# Patient Record
Sex: Female | Born: 1945
Health system: Southern US, Community
[De-identification: ages and names within clinical notes are randomized; demographics above are authoritative.]

## PROBLEM LIST (undated history)

## (undated) DIAGNOSIS — A159 Respiratory tuberculosis unspecified: Secondary | ICD-10-CM

## (undated) DIAGNOSIS — R519 Headache, unspecified: Secondary | ICD-10-CM

## (undated) DIAGNOSIS — C801 Malignant (primary) neoplasm, unspecified: Secondary | ICD-10-CM

## (undated) DIAGNOSIS — N6320 Unspecified lump in the left breast, unspecified quadrant: Secondary | ICD-10-CM

## (undated) DIAGNOSIS — I10 Essential (primary) hypertension: Secondary | ICD-10-CM

## (undated) DIAGNOSIS — R011 Cardiac murmur, unspecified: Secondary | ICD-10-CM

## (undated) DIAGNOSIS — R51 Headache: Secondary | ICD-10-CM

## (undated) DIAGNOSIS — I341 Nonrheumatic mitral (valve) prolapse: Secondary | ICD-10-CM

## (undated) HISTORY — PX: NO PAST SURGERIES: SHX2092

---

## 2015-05-18 ENCOUNTER — Emergency Department (HOSPITAL_COMMUNITY): Payer: BLUE CROSS/BLUE SHIELD

## 2015-05-18 ENCOUNTER — Emergency Department (HOSPITAL_COMMUNITY)
Admission: EM | Admit: 2015-05-18 | Discharge: 2015-05-18 | Disposition: A | Discharge status: Home / Self Care | Payer: BLUE CROSS/BLUE SHIELD | Attending: Emergency Medicine | Admitting: Emergency Medicine

## 2015-05-18 ENCOUNTER — Encounter (HOSPITAL_COMMUNITY): Payer: Self-pay | Admitting: Family Medicine

## 2015-05-18 DIAGNOSIS — Y9389 Activity, other specified: Secondary | ICD-10-CM | POA: Diagnosis not present

## 2015-05-18 DIAGNOSIS — Y998 Other external cause status: Secondary | ICD-10-CM | POA: Insufficient documentation

## 2015-05-18 DIAGNOSIS — S6991XA Unspecified injury of right wrist, hand and finger(s), initial encounter: Secondary | ICD-10-CM | POA: Insufficient documentation

## 2015-05-18 DIAGNOSIS — S0083XA Contusion of other part of head, initial encounter: Secondary | ICD-10-CM | POA: Diagnosis not present

## 2015-05-18 DIAGNOSIS — I10 Essential (primary) hypertension: Secondary | ICD-10-CM | POA: Diagnosis not present

## 2015-05-18 DIAGNOSIS — Y92410 Unspecified street and highway as the place of occurrence of the external cause: Secondary | ICD-10-CM | POA: Diagnosis not present

## 2015-05-18 DIAGNOSIS — S0033XA Contusion of nose, initial encounter: Secondary | ICD-10-CM | POA: Insufficient documentation

## 2015-05-18 DIAGNOSIS — S0993XA Unspecified injury of face, initial encounter: Secondary | ICD-10-CM | POA: Diagnosis present

## 2015-05-18 HISTORY — DX: Essential (primary) hypertension: I10

## 2015-05-18 HISTORY — DX: Nonrheumatic mitral (valve) prolapse: I34.1

## 2015-05-18 MED ORDER — ONDANSETRON 4 MG PO TBDP
4.0000 mg | ORAL_TABLET | Freq: Once | ORAL | Status: DC
  Filled 2015-05-18: qty 1

## 2015-05-18 MED ORDER — HYDROCODONE-ACETAMINOPHEN 5-325 MG PO TABS
1.0000 | ORAL_TABLET | Freq: Once | ORAL | Status: DC
  Filled 2015-05-18: qty 1

## 2015-05-18 NOTE — Discharge Instructions (Signed)
Ice over the bridge of your nose several times during the course of the evening tonight. Expected swelling and bruising around her nose and eyes tonight and tomorrow.  Contusion A contusion is a deep bruise. Contusions are the result of a blunt injury to tissues and muscle fibers under the skin. The injury causes bleeding under the skin. The skin overlying the contusion may turn blue, purple, or yellow. Minor injuries will give you a painless contusion, but more severe contusions may stay painful and swollen for a few weeks.  CAUSES  This condition is usually caused by a blow, trauma, or direct force to an area of the body. SYMPTOMS  Symptoms of this condition include:  Swelling of the injured area.  Pain and tenderness in the injured area.  Discoloration. The area may have redness and then turn blue, purple, or yellow. DIAGNOSIS  This condition is diagnosed based on a physical exam and medical history. An X-ray, CT scan, or MRI may be needed to determine if there are any associated injuries, such as broken bones (fractures). TREATMENT  Specific treatment for this condition depends on what area of the body was injured. In general, the best treatment for a contusion is resting, icing, applying pressure to (compression), and elevating the injured area. This is often called the RICE strategy. Over-the-counter anti-inflammatory medicines may also be recommended for pain control.  HOME CARE INSTRUCTIONS   Rest the injured area.  If directed, apply ice to the injured area:  Put ice in a plastic bag.  Place a towel between your skin and the bag.  Leave the ice on for 20 minutes, 2-3 times per day.  If directed, apply light compression to the injured area using an elastic bandage. Make sure the bandage is not wrapped too tightly. Remove and reapply the bandage as directed by your health care provider.  If possible, raise (elevate) the injured area above the level of your heart while you are  sitting or lying down.  Take over-the-counter and prescription medicines only as told by your health care provider. SEEK MEDICAL CARE IF:  Your symptoms do not improve after several days of treatment.  Your symptoms get worse.  You have difficulty moving the injured area. SEEK IMMEDIATE MEDICAL CARE IF:   You have severe pain.  You have numbness in a hand or foot.  Your hand or foot turns pale or cold.   This information is not intended to replace advice given to you by your health care provider. Make sure you discuss any questions you have with your health care provider.   Document Released: 02/05/2005 Document Revised: 01/17/2015 Document Reviewed: 09/13/2014 Elsevier Interactive Patient Education 2016 [place].  Facial or Scalp Contusion  A facial or scalp contusion is a deep bruise on the face or head. Contusions happen when an injury causes bleeding under the skin. Signs of bruising include pain, puffiness (swelling), and discolored skin. The contusion may turn blue, purple, or yellow. HOME CARE  Only take medicines as told by your doctor.  Put ice on the injured area.  Put ice in a plastic bag.  Place a towel between your skin and the bag.  Leave the ice on for 20 minutes, 2-3 times a day. GET HELP IF:  You have bite problems.  You have pain when chewing.  You are worried about your face not healing normally. GET HELP RIGHT AWAY IF:   You have severe pain or a headache and medicine does not help.  You are very tired or confused, or your personality changes.  You throw up (vomit).  You have a nosebleed that will not stop.  You see two of everything (double vision) or have blurry vision.  You have fluid coming from your nose or ear.  You have problems walking or using your arms or legs. MAKE SURE YOU:   Understand these instructions.  Will watch your condition.  Will get help right away if you are not doing well or get worse.   This  information is not intended to replace advice given to you by your health care provider. Make sure you discuss any questions you have with your health care provider.   Document Released: 04/17/2011 Document Revised: 05/19/2014 Document Reviewed: 12/09/2012 Elsevier Interactive Patient Education [employer].

## 2015-05-18 NOTE — ED Notes (Signed)
Pt ambulatory to restroom

## 2015-05-18 NOTE — ED Notes (Signed)
MD at bedside. 

## 2015-05-18 NOTE — ED Notes (Signed)
Pt only complaining of pain to right wrist and nose. Pt alert and oriented, ambulatory.
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
## 2015-06-02 ENCOUNTER — Emergency Department (HOSPITAL_COMMUNITY)
Admission: EM | Admit: 2015-06-02 | Discharge: 2015-06-02 | Disposition: A | Discharge status: Home / Self Care | Payer: No Typology Code available for payment source | Attending: Emergency Medicine | Admitting: Emergency Medicine

## 2015-06-02 ENCOUNTER — Emergency Department (HOSPITAL_COMMUNITY): Payer: No Typology Code available for payment source

## 2015-06-02 ENCOUNTER — Encounter (HOSPITAL_COMMUNITY): Payer: Self-pay | Admitting: *Deleted

## 2015-06-02 DIAGNOSIS — R42 Dizziness and giddiness: Secondary | ICD-10-CM | POA: Insufficient documentation

## 2015-06-02 DIAGNOSIS — F0781 Postconcussional syndrome: Secondary | ICD-10-CM | POA: Diagnosis not present

## 2015-06-02 DIAGNOSIS — R51 Headache: Secondary | ICD-10-CM | POA: Diagnosis present

## 2015-06-02 DIAGNOSIS — I10 Essential (primary) hypertension: Secondary | ICD-10-CM | POA: Diagnosis not present

## 2015-06-02 LAB — URINE MICROSCOPIC-ADD ON: RBC / HPF: NONE SEEN RBC/hpf (ref 0–5)

## 2015-06-02 LAB — BASIC METABOLIC PANEL
Anion gap: 13 (ref 5–15)
BUN: 8 mg/dL (ref 6–20)
CALCIUM: 9.7 mg/dL (ref 8.9–10.3)
CO2: 26 mmol/L (ref 22–32)
CREATININE: 0.77 mg/dL (ref 0.44–1.00)
Chloride: 101 mmol/L (ref 101–111)
GFR calc Af Amer: >60 mL/min (ref >60)
GLUCOSE: 115 mg/dL — AB (ref 65–99)
Potassium: 4.1 mmol/L (ref 3.5–5.1)
SODIUM: 140 mmol/L (ref 135–145)

## 2015-06-02 LAB — URINALYSIS, ROUTINE W REFLEX MICROSCOPIC
BILIRUBIN URINE: NEGATIVE
GLUCOSE, UA: NEGATIVE mg/dL
HGB URINE DIPSTICK: NEGATIVE
KETONES UR: NEGATIVE mg/dL
Nitrite: NEGATIVE
PROTEIN: NEGATIVE mg/dL
Specific Gravity, Urine: 1.006 (ref 1.005–1.030)
pH: 7 (ref 5.0–8.0)

## 2015-06-02 LAB — CBC WITH DIFFERENTIAL/PLATELET
BASOS ABS: 0.1 10*3/uL (ref 0.0–0.1)
BASOS PCT: 1 %
Eosinophils Absolute: 0.1 10*3/uL (ref 0.0–0.7)
Eosinophils Relative: 2 %
HEMATOCRIT: 38.2 % (ref 36.0–46.0)
Hemoglobin: 12.7 g/dL (ref 12.0–15.0)
Lymphocytes Relative: 30 %
Lymphs Abs: 2.2 10*3/uL (ref 0.7–4.0)
MCH: 29.3 pg (ref 26.0–34.0)
MCHC: 33.2 g/dL (ref 30.0–36.0)
MCV: 88.2 fL (ref 78.0–100.0)
MONO ABS: 0.4 10*3/uL (ref 0.1–1.0)
Monocytes Relative: 6 %
NEUTROS ABS: 4.5 10*3/uL (ref 1.7–7.7)
NEUTROS PCT: 61 %
PLATELETS: 246 10*3/uL (ref 150–400)
RBC: 4.33 MIL/uL (ref 3.87–5.11)
RDW: 13.2 % (ref 11.5–15.5)
WBC: 7.3 10*3/uL (ref 4.0–10.5)

## 2015-06-02 MED ORDER — HYDROCODONE-ACETAMINOPHEN 5-325 MG PO TABS: 1.0000 | ORAL_TABLET | Freq: Four times a day (QID) | ORAL | Status: DC | PRN

## 2015-06-02 MED ORDER — HYDROCODONE-ACETAMINOPHEN 5-325 MG PO TABS
2.0000 | ORAL_TABLET | Freq: Once | ORAL | Status: AC
  Administered 2015-06-02: 2 via ORAL
  Filled 2015-06-02: qty 2

## 2015-06-02 NOTE — ED Notes (Signed)
Family notified of CT results and delay explained.

## 2015-06-02 NOTE — ED Notes (Signed)
Patient's family member came to nurses station stating that patient currently feels much worse. This RN entered to assess patient. Patient found squatting next to stretcher with head tucked to knees. Patient alert and oriented. Family member explained that patient became diaphoretic then expressed that she felt nauseated, weak, and dizzy. Currently if patient attempts to move from position she endorses exacerbation of symptoms. MD Steinl informed of change.
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
## 2015-06-02 NOTE — ED Notes (Addendum)
Pt ambulated in the hall well, slight dizziness once back in bed but manageable.  No dramatic change in vitals.  Provider notified

## 2015-06-02 NOTE — Discharge Instructions (Signed)
???????-???????? ?????? ? ???????? (Head Injury, Adult) ?? ???????? ???????-???????? ??????. ??????? ??? ?? ?????????????? ?????????. ????? ???????-???????? ?????? ????? ?????????? ???????? ???? ? ?????. ??? ????? ?????????. ?????? ????????? ???????? ?? ????????? ????? ??? ?????????? ? ????????? ?????????? ??????. ?????? ????????? ??????????????. ??????????? ?????????? [????? ?????, ??? ? ???] ????????? ? ??????? ?????? 24 ?????, ?? ???????? ??????? ????? ????????? ?? ?????? ?? 7-10 ???? ????? ??????. ????? ????????? ??????? ?? ????? ?????????? ? ????????? ? ?????? ??? ?????????? ?????????? ?? ??????????? ???????, ???? ? ????????? ???????? ?????????. ????? ????????? ???? ???????-???????? ??????? ???????-???????? ?????? ????? ???? ??????????????, ????????, ????? ?? ?????. ????????? ?????? ?????? ????? ???? ????? ??????????. ????? ??????? ?????? ?????? ????????:  ?????????? ????? (????????).  ???? ????????? ????? (????????). ??? ????????, ??? ????? ????? ????????????? ? ????, ??? ????? ???????? ? ?????.  ??????? ?????? (??????? ?????? ??????).  ????????????? ? ????, ????? ?????????? ?????, ?????????? ??????? ? ??????????? ????? (????????). ?????? ??????? ???????-???????? ??????? ????????? ?????? ?????? ???? ??????????? ? ?????, ???????? ? ????????????? ?????? ? ??????? ?? ???? ??????????? ?????? ????????????. ????? ?????? ?????? ????????? ????? ?????? - ???????????? ??? ????????????? ??????, ?????????? ?????? ? ???????. ??? ????? ?????????? ???????-???????? ??????? ??? ??????????? ?????? ?????? ????? ??????? ????? ?????? ??????? ???????, ?????????? ? ??????, ? ??????? ????????. ??????? ??????? ?????? ????????? ?????????? ???????????? ?????? ????????? ?????, ??????????, ????????, ??? ??-???????????? ??? ???-????????????. ?????, ??? ?????????? ????????? ???????? ?? ???? ? ????????.  ? ????? ??????? ??????? ?????? ?????????? ? ??????  ?????????? ?????? ?????????, ????:  ?????????? ??????????? ????????  ??? ??????????.  ?? ?????????? ?????????? ? ??????(???????) ??? ?????????? ???????????????? ??????? ?????.  ? ??? ??????????? ?????????????? ??? ?????????????? [??? ??????/???????].  ? ??? ?????????? ???????, ???????????????? ???????? ????, ?? ????????? ???????????. ??? ?????????? ????, ????????? ? ???????? ??????????? ?????????? ?????? ??????????? ?????? ?????????????? ??? ??????????? ?????????.  ? ??? ?????????? ????????? ?????????? ??????? ??? ??? ???, ??? ?? ?????? ??????.  ?? ???????? ??????? ? ??????? ?????? ????? ? ?????? ??????? ????? ???? (???????).  ?? ???????? ????????? ?????????? ??? ???????? ???????? ?? ???? ??? ????.  ? ??? ??????????? ????????? ??????. ? ??????? ????????? 24 ????? ????? ?????? ?? ?????? ?????????? ? ???, ??? ?????? ????????? ?? ?????????? ????????? ?????????. ???? ??????? ?????? ????????? ? ??????? ?????????? ?????? (?? ???????? 911 ? ???), ???? ? ??? ???????? ????????, ?????? ???????? ??? ??? ????? ?????????? ?????????. ??? ? ???? ????????????? ????????????? ???????-???????? ?????? ? ???????? ???????? ?????? ????? ?????????????? ????????? ????? ?????? ???????? ????????? ???????-???????????? ????????????. ????? ?????? ? ???????? ????????????? ??????????? ????? ?????? ??????, ????? ????? ?? ????? ???? ? ??????????? ?????????????? ??????? ????????????. ????? ??????? ??????? ?????? ??? ???? ?? ?????????? ? ?? ????? ?????????? ?????????? ??? (???, ????????, ??????). ????? ????, ??????? ???????? ??????? ????? ???????????? ?? ????; ??? ????? ?????????????? ???????? ????? ????????????? ?????? ??????.  ????? ? C???? ????????? ? ??????? ???????????? ? ???????? ???????? ?????? ??? ?? ????????? ? ???? ????????????, ???? ????????? ?????? ???? ???????? ??????? ??????. ???? ? ??? ?????????? ????? ?? ????????????????? ?????????, ?? ?? ?????? ???????????? ? ??????? ???????????? ??? ??????????? ? ?????????? ????? ?????? ?? ????? 1 ?????? ????? ????, ??? ??? ????????  ????????:  ?????????? ???????? ????.  ?????????????? ??? ???????.  ?????? ???????? ? ????????????.  ??????????? ????????.  ????????? ??????.  ??????? ??? ?????.  ?????????? ????????????.  ?????????????????.  ??????????????? ????? ????? ??? ??????? ??????.  ??????? ??? ?????????.  ????????? ???. ?????????, ??? ??:   ????????? ?????? ??????????.  ?????? ?????????????? ??????? ?? ????? ??????????.  ??????????????? ?????????? ? ?????, ???? ??? ?? ?????????? ????? ??? ?????????? ????.   ??? ?????????? ?? ????? ???????? ??????, ??????????????? ????? ??????. ??????????? ???????? ????? ???????????? ??? ??????? ? ????? ??????? ??????.  Document Released: 04/28/2005 Document Revised: 05/19/2014 Elsevier Interactive Patient Education [employer].

## 2015-06-04 ENCOUNTER — Telehealth: Payer: Self-pay | Admitting: Internal Medicine
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
## 2015-06-13 NOTE — Telephone Encounter (Signed)
[place] if not a "[employer]

## 2015-06-13 NOTE — Telephone Encounter (Signed)
Left vm to call back to schedule appt.  °

## 2015-06-19 ENCOUNTER — Ambulatory Visit (INDEPENDENT_AMBULATORY_CARE_PROVIDER_SITE_OTHER): Payer: BLUE CROSS/BLUE SHIELD | Admitting: Neurology

## 2015-06-19 ENCOUNTER — Encounter: Payer: Self-pay | Admitting: Neurology

## 2015-06-19 VITALS — BP 132/74 | HR 82 | Resp 16 | Wt 167.0 lb

## 2015-06-19 DIAGNOSIS — F0781 Postconcussional syndrome: Secondary | ICD-10-CM

## 2015-06-19 MED ORDER — AMITRIPTYLINE HCL 10 MG PO TABS: ORAL_TABLET | ORAL | Status: DC

## 2015-06-19 MED ORDER — MECLIZINE HCL 25 MG PO TABS: 25.0000 mg | ORAL_TABLET | Freq: Three times a day (TID) | ORAL | Status: DC | PRN
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
## 2015-06-25 ENCOUNTER — Other Ambulatory Visit (INDEPENDENT_AMBULATORY_CARE_PROVIDER_SITE_OTHER): Payer: BLUE CROSS/BLUE SHIELD

## 2015-06-25 ENCOUNTER — Ambulatory Visit (INDEPENDENT_AMBULATORY_CARE_PROVIDER_SITE_OTHER): Payer: BLUE CROSS/BLUE SHIELD | Admitting: Internal Medicine

## 2015-06-25 ENCOUNTER — Encounter: Payer: Self-pay | Admitting: Internal Medicine

## 2015-06-25 VITALS — BP 170/78 | HR 75 | Ht 61.0 in | Wt 168.0 lb

## 2015-06-25 DIAGNOSIS — E559 Vitamin D deficiency, unspecified: Secondary | ICD-10-CM

## 2015-06-25 DIAGNOSIS — E785 Hyperlipidemia, unspecified: Secondary | ICD-10-CM

## 2015-06-25 DIAGNOSIS — R413 Other amnesia: Secondary | ICD-10-CM | POA: Insufficient documentation

## 2015-06-25 DIAGNOSIS — F0781 Postconcussional syndrome: Secondary | ICD-10-CM

## 2015-06-25 DIAGNOSIS — I10 Essential (primary) hypertension: Secondary | ICD-10-CM

## 2015-06-25 DIAGNOSIS — R270 Ataxia, unspecified: Secondary | ICD-10-CM

## 2015-06-25 DIAGNOSIS — R453 Demoralization and apathy: Secondary | ICD-10-CM | POA: Insufficient documentation

## 2015-06-25 DIAGNOSIS — R202 Paresthesia of skin: Secondary | ICD-10-CM

## 2015-06-25 DIAGNOSIS — R5383 Other fatigue: Secondary | ICD-10-CM | POA: Insufficient documentation

## 2015-06-25 DIAGNOSIS — R5382 Chronic fatigue, unspecified: Secondary | ICD-10-CM

## 2015-06-25 LAB — CBC WITH DIFFERENTIAL/PLATELET
BASOS PCT: 1.4 % (ref 0.0–3.0)
Basophils Absolute: 0.1 10*3/uL (ref 0.0–0.1)
EOS PCT: 2.2 % (ref 0.0–5.0)
Eosinophils Absolute: 0.1 10*3/uL (ref 0.0–0.7)
HCT: 40.5 % (ref 36.0–46.0)
Hemoglobin: 13.5 g/dL (ref 12.0–15.0)
LYMPHS ABS: 1.7 10*3/uL (ref 0.7–4.0)
Lymphocytes Relative: 25.3 % (ref 12.0–46.0)
MCHC: 33.3 g/dL (ref 30.0–36.0)
MCV: 87.5 fl (ref 78.0–100.0)
MONO ABS: 0.4 10*3/uL (ref 0.1–1.0)
Monocytes Relative: 6.6 % (ref 3.0–12.0)
NEUTROS PCT: 64.5 % (ref 43.0–77.0)
Neutro Abs: 4.3 10*3/uL (ref 1.4–7.7)
Platelets: 287 10*3/uL (ref 150.0–400.0)
RBC: 4.62 Mil/uL (ref 3.87–5.11)
RDW: 14.6 % (ref 11.5–15.5)
WBC: 6.7 10*3/uL (ref 4.0–10.5)

## 2015-06-25 LAB — BASIC METABOLIC PANEL
BUN: 13 mg/dL (ref 6–23)
CO2: 29 mEq/L (ref 19–32)
Calcium: 9.6 mg/dL (ref 8.4–10.5)
Chloride: 106 mEq/L (ref 96–112)
Creatinine, Ser: 0.85 mg/dL (ref 0.40–1.20)
GFR: 70.12 mL/min (ref >60.00)
GLUCOSE: 115 mg/dL — AB (ref 70–99)
POTASSIUM: 4.3 meq/L (ref 3.5–5.1)
Sodium: 142 mEq/L (ref 135–145)

## 2015-06-25 LAB — URINALYSIS, ROUTINE W REFLEX MICROSCOPIC
BILIRUBIN URINE: NEGATIVE
Hgb urine dipstick: NEGATIVE
Ketones, ur: NEGATIVE
NITRITE: NEGATIVE
PH: 7.5 (ref 5.0–8.0)
RBC / HPF: NONE SEEN (ref 0–?)
SPECIFIC GRAVITY, URINE: 1.01 (ref 1.000–1.030)
Total Protein, Urine: NEGATIVE
Urine Glucose: NEGATIVE
Urobilinogen, UA: 0.2 (ref 0.0–1.0)

## 2015-06-25 LAB — VITAMIN D 25 HYDROXY (VIT D DEFICIENCY, FRACTURES): VITD: 23.56 ng/mL — AB (ref 30.00–100.00)

## 2015-06-25 LAB — HEPATIC FUNCTION PANEL
ALT: 26 U/L (ref 0–35)
AST: 20 U/L (ref 0–37)
Albumin: 4.5 g/dL (ref 3.5–5.2)
Alkaline Phosphatase: 78 U/L (ref 39–117)
BILIRUBIN TOTAL: 0.4 mg/dL (ref 0.2–1.2)
Bilirubin, Direct: 0.1 mg/dL (ref 0.0–0.3)
Total Protein: 7.6 g/dL (ref 6.0–8.3)

## 2015-06-25 LAB — LIPID PANEL
CHOL/HDL RATIO: 5
Cholesterol: 281 mg/dL — ABNORMAL HIGH (ref 0–200)
HDL: 59.5 mg/dL (ref >39.00)
LDL Cholesterol: 185 mg/dL — ABNORMAL HIGH (ref 0–99)
NONHDL: 221.35
Triglycerides: 180 mg/dL — ABNORMAL HIGH (ref 0.0–149.0)
VLDL: 36 mg/dL (ref 0.0–40.0)

## 2015-06-25 LAB — VITAMIN B12: VITAMIN B 12: 542 pg/mL (ref 211–911)

## 2015-06-25 LAB — TSH: TSH: 1.77 u[IU]/mL (ref 0.35–4.50)

## 2015-06-25 MED ORDER — LOSARTAN POTASSIUM 100 MG PO TABS: 100.0000 mg | ORAL_TABLET | Freq: Every day | ORAL | Status: DC

## 2015-06-25 MED ORDER — PHOSPHATIDYLSERINE-DHA-EPA 100-19.5-6.5 MG PO CAPS: 1.0000 | ORAL_CAPSULE | Freq: Every day | ORAL | Status: DC

## 2015-06-25 MED ORDER — ERGOCALCIFEROL 1.25 MG (50000 UT) PO CAPS: 50000.0000 [IU] | ORAL_CAPSULE | ORAL | Status: DC

## 2015-06-25 MED ORDER — VITAMIN D 1000 UNITS PO TABS: 1000.0000 [IU] | ORAL_TABLET | Freq: Every day | ORAL | Status: DC
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
## 2015-06-25 NOTE — Assessment & Plan Note (Signed)
Added Losartan NAS diet
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
## 2015-06-25 NOTE — Assessment & Plan Note (Addendum)
  2/17 Start [employer]
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
## 2015-06-25 NOTE — Progress Notes (Signed)
Pre visit review using our clinic review tool, if applicable. No additional management support is needed unless otherwise documented below in the visit note. 
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
## 2015-06-25 NOTE — Assessment & Plan Note (Signed)
start Vit D prescription 50000 iu weekly (Rx emailed to your pharmacy) followed by over-the-counter Vit D 1000 iu daily.  
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
## 2015-07-04 ENCOUNTER — Ambulatory Visit: Payer: BLUE CROSS/BLUE SHIELD | Admitting: Neurology

## 2015-07-10 ENCOUNTER — Ambulatory Visit (INDEPENDENT_AMBULATORY_CARE_PROVIDER_SITE_OTHER): Payer: BLUE CROSS/BLUE SHIELD | Admitting: Internal Medicine

## 2015-07-10 ENCOUNTER — Encounter: Payer: Self-pay | Admitting: Internal Medicine

## 2015-07-10 VITALS — BP 160/80 | HR 70 | Wt 170.0 lb

## 2015-07-10 DIAGNOSIS — E559 Vitamin D deficiency, unspecified: Secondary | ICD-10-CM

## 2015-07-10 DIAGNOSIS — R413 Other amnesia: Secondary | ICD-10-CM | POA: Diagnosis not present

## 2015-07-10 DIAGNOSIS — F0781 Postconcussional syndrome: Secondary | ICD-10-CM

## 2015-07-10 DIAGNOSIS — R270 Ataxia, unspecified: Secondary | ICD-10-CM | POA: Diagnosis not present

## 2015-07-10 DIAGNOSIS — I10 Essential (primary) hypertension: Secondary | ICD-10-CM

## 2015-07-10 DIAGNOSIS — R453 Demoralization and apathy: Secondary | ICD-10-CM

## 2015-07-10 MED ORDER — HYDROCHLOROTHIAZIDE 12.5 MG PO CAPS: 12.5000 mg | ORAL_CAPSULE | Freq: Every day | ORAL | Status: DC

## 2015-07-10 MED ORDER — BUTALBITAL-APAP-CAFFEINE 50-325-40 MG PO TABS: 1.0000 | ORAL_TABLET | Freq: Two times a day (BID) | ORAL | Status: DC | PRN
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
## 2015-07-10 NOTE — Assessment & Plan Note (Signed)
Vit D prescription 50000 iu weekly (Rx emailed to your pharmacy) followed by over-the-counter Vit D 1000 iu daily.

## 2015-07-10 NOTE — Assessment & Plan Note (Addendum)
PT/OT offered Exercising at home to improve balance

## 2015-07-10 NOTE — Progress Notes (Signed)
Pre visit review using our clinic review tool, if applicable. No additional management support is needed unless otherwise documented below in the visit note. 
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
## 2015-07-10 NOTE — Assessment & Plan Note (Addendum)
On Vayacog Rest

## 2015-07-10 NOTE — Assessment & Plan Note (Signed)
Unable to work On [employer] prn  Potential benefits of a long term Fioricet  use as well as potential risks  and complications were explained to the patient and were aknowledged.  Rest

## 2015-08-22 ENCOUNTER — Encounter: Payer: Self-pay | Admitting: Internal Medicine

## 2015-08-22 ENCOUNTER — Ambulatory Visit (INDEPENDENT_AMBULATORY_CARE_PROVIDER_SITE_OTHER): Payer: BLUE CROSS/BLUE SHIELD | Admitting: Internal Medicine

## 2015-08-22 VITALS — BP 150/78 | HR 67 | Wt 170.0 lb

## 2015-08-22 DIAGNOSIS — R270 Ataxia, unspecified: Secondary | ICD-10-CM

## 2015-08-22 DIAGNOSIS — R413 Other amnesia: Secondary | ICD-10-CM

## 2015-08-22 DIAGNOSIS — E785 Hyperlipidemia, unspecified: Secondary | ICD-10-CM | POA: Diagnosis not present

## 2015-08-22 DIAGNOSIS — I10 Essential (primary) hypertension: Secondary | ICD-10-CM | POA: Diagnosis not present

## 2015-08-22 DIAGNOSIS — R453 Demoralization and apathy: Secondary | ICD-10-CM | POA: Diagnosis not present

## 2015-08-22 MED ORDER — ATORVASTATIN CALCIUM 10 MG PO TABS: 10.0000 mg | ORAL_TABLET | Freq: Every day | ORAL | Status: DC

## 2015-08-22 MED ORDER — TRIAMTERENE-HCTZ 37.5-25 MG PO TABS: 1.0000 | ORAL_TABLET | Freq: Every day | ORAL | Status: DC

## 2015-08-22 NOTE — Assessment & Plan Note (Addendum)
Alenolol,  Change Losartan-HCT to Losartan and Maxzide

## 2015-08-22 NOTE — Assessment & Plan Note (Signed)
A little better 

## 2015-08-22 NOTE — Patient Instructions (Addendum)
Stop HCTZ Start Maxzide 1/2 tab daily Atorvastatin 10 mg 1/2 tab 3 times a week
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
## 2015-08-22 NOTE — Assessment & Plan Note (Signed)
A little better Cont [facility]

## 2015-08-22 NOTE — Progress Notes (Signed)
Pre visit review using our clinic review tool, if applicable. No additional management support is needed unless otherwise documented below in the visit note. 

## 2015-08-26 NOTE — Assessment & Plan Note (Signed)
A little better 

## 2015-08-26 NOTE — Assessment & Plan Note (Signed)
Slow improvement

## 2015-08-29 ENCOUNTER — Ambulatory Visit: Payer: BLUE CROSS/BLUE SHIELD | Admitting: Neurology

## 2015-09-05 ENCOUNTER — Ambulatory Visit: Payer: BLUE CROSS/BLUE SHIELD | Admitting: Neurology

## 2015-10-22 ENCOUNTER — Other Ambulatory Visit (INDEPENDENT_AMBULATORY_CARE_PROVIDER_SITE_OTHER): Payer: BLUE CROSS/BLUE SHIELD

## 2015-10-22 DIAGNOSIS — I10 Essential (primary) hypertension: Secondary | ICD-10-CM | POA: Diagnosis not present

## 2015-10-22 DIAGNOSIS — R413 Other amnesia: Secondary | ICD-10-CM

## 2015-10-22 DIAGNOSIS — E785 Hyperlipidemia, unspecified: Secondary | ICD-10-CM

## 2015-10-22 LAB — LIPID PANEL
CHOL/HDL RATIO: 4
Cholesterol: 262 mg/dL — ABNORMAL HIGH (ref 0–200)
HDL: 61.2 mg/dL (ref >39.00)
LDL CALC: 178 mg/dL — AB (ref 0–99)
NONHDL: 200.48
Triglycerides: 110 mg/dL (ref 0.0–149.0)
VLDL: 22 mg/dL (ref 0.0–40.0)

## 2015-10-22 LAB — HEPATIC FUNCTION PANEL
ALBUMIN: 4.2 g/dL (ref 3.5–5.2)
ALT: 23 U/L (ref 0–35)
AST: 20 U/L (ref 0–37)
Alkaline Phosphatase: 67 U/L (ref 39–117)
BILIRUBIN TOTAL: 0.5 mg/dL (ref 0.2–1.2)
Bilirubin, Direct: 0.1 mg/dL (ref 0.0–0.3)
Total Protein: 7 g/dL (ref 6.0–8.3)

## 2015-10-22 LAB — BASIC METABOLIC PANEL
BUN: 15 mg/dL (ref 6–23)
CO2: 27 meq/L (ref 19–32)
Calcium: 9.2 mg/dL (ref 8.4–10.5)
Chloride: 106 mEq/L (ref 96–112)
Creatinine, Ser: 0.83 mg/dL (ref 0.40–1.20)
GFR: 72 mL/min (ref >60.00)
GLUCOSE: 112 mg/dL — AB (ref 70–99)
Potassium: 4.4 mEq/L (ref 3.5–5.1)
SODIUM: 141 meq/L (ref 135–145)

## 2015-10-22 LAB — SEDIMENTATION RATE: Sed Rate: 16 mm/hr (ref 0–30)

## 2015-10-24 ENCOUNTER — Encounter: Payer: Self-pay | Admitting: Internal Medicine

## 2015-10-24 ENCOUNTER — Ambulatory Visit (INDEPENDENT_AMBULATORY_CARE_PROVIDER_SITE_OTHER): Payer: BLUE CROSS/BLUE SHIELD | Admitting: Internal Medicine

## 2015-10-24 VITALS — BP 150/82 | HR 104 | Wt 169.0 lb

## 2015-10-24 DIAGNOSIS — F0781 Postconcussional syndrome: Secondary | ICD-10-CM | POA: Diagnosis not present

## 2015-10-24 DIAGNOSIS — R413 Other amnesia: Secondary | ICD-10-CM

## 2015-10-24 DIAGNOSIS — E785 Hyperlipidemia, unspecified: Secondary | ICD-10-CM

## 2015-10-24 DIAGNOSIS — I10 Essential (primary) hypertension: Secondary | ICD-10-CM | POA: Diagnosis not present

## 2015-10-24 MED ORDER — IRBESARTAN-HYDROCHLOROTHIAZIDE 150-12.5 MG PO TABS: 1.0000 | ORAL_TABLET | Freq: Every day | ORAL | Status: DC

## 2015-10-24 MED ORDER — ATORVASTATIN CALCIUM 20 MG PO TABS: 20.0000 mg | ORAL_TABLET | Freq: Every day | ORAL | Status: DC

## 2015-10-24 NOTE — Assessment & Plan Note (Addendum)
Increase the dose to 20 mg Labs in 3 mo
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
## 2015-10-24 NOTE — Assessment & Plan Note (Addendum)
On Alenolol,  pt stopped Losartan, Maxzide Start Avalide

## 2015-10-24 NOTE — Progress Notes (Signed)
Pre visit review using our clinic review tool, if applicable. No additional management support is needed unless otherwise documented below in the visit note. 

## 2015-10-24 NOTE — Assessment & Plan Note (Signed)
On [facility]
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
## 2015-11-20 ENCOUNTER — Encounter (HOSPITAL_COMMUNITY): Payer: Self-pay | Admitting: Emergency Medicine

## 2015-11-20 ENCOUNTER — Emergency Department (HOSPITAL_COMMUNITY)
Admission: EM | Admit: 2015-11-20 | Discharge: 2015-11-20 | Disposition: A | Discharge status: Home / Self Care | Payer: Medicare HMO | Attending: Emergency Medicine | Admitting: Emergency Medicine

## 2015-11-20 ENCOUNTER — Telehealth: Payer: Self-pay | Admitting: Internal Medicine

## 2015-11-20 DIAGNOSIS — T148XXA Other injury of unspecified body region, initial encounter: Secondary | ICD-10-CM

## 2015-11-20 DIAGNOSIS — S61256A Open bite of right little finger without damage to nail, initial encounter: Secondary | ICD-10-CM | POA: Insufficient documentation

## 2015-11-20 DIAGNOSIS — Z79899 Other long term (current) drug therapy: Secondary | ICD-10-CM | POA: Insufficient documentation

## 2015-11-20 DIAGNOSIS — Y999 Unspecified external cause status: Secondary | ICD-10-CM | POA: Insufficient documentation

## 2015-11-20 DIAGNOSIS — W5501XA Bitten by cat, initial encounter: Secondary | ICD-10-CM | POA: Insufficient documentation

## 2015-11-20 DIAGNOSIS — Y939 Activity, unspecified: Secondary | ICD-10-CM | POA: Insufficient documentation

## 2015-11-20 DIAGNOSIS — Y92009 Unspecified place in unspecified non-institutional (private) residence as the place of occurrence of the external cause: Secondary | ICD-10-CM | POA: Diagnosis not present

## 2015-11-20 DIAGNOSIS — Z7982 Long term (current) use of aspirin: Secondary | ICD-10-CM | POA: Diagnosis not present

## 2015-11-20 DIAGNOSIS — I10 Essential (primary) hypertension: Secondary | ICD-10-CM | POA: Diagnosis not present

## 2015-11-20 MED ORDER — TETANUS-DIPHTH-ACELL PERTUSSIS 5-2.5-18.5 LF-MCG/0.5 IM SUSP
0.5000 mL | Freq: Once | INTRAMUSCULAR | Status: AC
  Administered 2015-11-20: 0.5 mL via INTRAMUSCULAR
  Filled 2015-11-20: qty 0.5

## 2015-11-20 MED ORDER — AMOXICILLIN-POT CLAVULANATE 875-125 MG PO TABS: 1.0000 | ORAL_TABLET | Freq: Two times a day (BID) | ORAL | Status: DC

## 2015-11-20 MED ORDER — RABIES IMMUNE GLOBULIN 150 UNIT/ML IM INJ
20.0000 [IU]/kg | INJECTION | Freq: Once | INTRAMUSCULAR | Status: AC
  Administered 2015-11-20: 1500 [IU] via INTRAMUSCULAR
  Filled 2015-11-20: qty 10

## 2015-11-20 MED ORDER — RABIES VACCINE, PCEC IM SUSR
1.0000 mL | Freq: Once | INTRAMUSCULAR | Status: AC
  Administered 2015-11-20: 1 mL via INTRAMUSCULAR
  Filled 2015-11-20: qty 1

## 2015-11-20 NOTE — Telephone Encounter (Signed)
Patient Name: Robin Estrada DOB: [birth year] Initial Comment Mother has a mole bite Nurse Assessment Nurse: Vallery Sa, RN, [place] Date/Time (Eastern Time): 11/20/2015 1:36:33 PM Confirm and document reason for call. If symptomatic, describe symptoms. You must click the next button to save text entered. ---Caller states his mother has a mole bite her right pinky finger about 2-3 hours ago. No active bleeding. Has the patient traveled out of the country within the last 30 days? ---No Does the patient have any new or worsening symptoms? ---Yes Will a triage be completed? ---Yes Related visit to physician within the last 2 weeks? ---No Does the PT have any chronic conditions? (i.e. diabetes, asthma, etc.) ---Yes List chronic conditions. ---High Blood Pressure, Headaches Is this a behavioral health or substance abuse call? ---No Guidelines Guideline Title Affirmed Question Affirmed Notes Animal Bite [1] Any break in skin (e.g., cut, puncture or scratch) AND [2] wild animal at risk for RABIES (e.g., bat, raccoon, fox, skunk, coyote, other carnivores) Final Disposition User Go to ED Now Vallery Sa, RN, [facility] - ED Disagree/Comply: Comply

## 2015-11-20 NOTE — ED Notes (Signed)
Pt brought a mole from her yard into her house and went to pick it up and was bitten on her right pinky finger.

## 2015-11-20 NOTE — ED Provider Notes (Signed)
CSN: [id number]     Arrival date & time 11/20/15  1443 History  By signing my name below, I, Robin Estrada, attest that this documentation has been prepared under the direction and in the presence of Robin Evangelist, PA-C Electronically Signed: Soijett Estrada, ED Scribe. 11/20/2015. 4:19 PM.   Chief Complaint  Patient presents with  . Animal Bite    The history is provided by the patient and a relative. No language interpreter was used.    Robin Estrada is a 70 y.o. female who presents to the Emergency Department complaining of an animal bite onset 11 AM today. Son provides translates history since patient speaks [place]. Pt notes that her cat brought a mole from her yard into her house and the pt went to pick up the mole, and it bit her on her right pinky finger. Pt denies pain to the area. Pt is not UTD on her tetanus vaccination. Pt reports that she has tried wound care by irrigating it and applying iodine. Pt denies right pinky finger pain, swelling, and any other symptoms. She states she is almost positive that the animal was a mole and states it got out of the house alive.   Past Medical History  Diagnosis Date  . Mitral valve prolapse   . Hypertension    Past Surgical History  Procedure Laterality Date  . No past surgeries     Family History  Problem Relation Age of Onset  . Stroke Mother   . Cancer Mother 18    breast ca  . Stroke Maternal Grandmother    Social History  Substance Use Topics  . Smoking status: Never Smoker   . Smokeless tobacco: Never Used  . Alcohol Use: No   OB History    No data available     Review of Systems  Constitutional: Negative for fever and chills.  Musculoskeletal: Negative for joint swelling and arthralgias.  Skin: Positive for wound (animal bite to right pinky).      Allergies  Review of patient's allergies indicates no known allergies.  Home Medications   Prior to Admission medications   Medication Sig Start Date End Date  Taking? Authorizing Provider  aspirin 81 MG tablet Take 81 mg by mouth daily.    Historical Provider, MD  atenolol (TENORMIN) 50 MG tablet Take 50 mg by mouth 2 (two) times daily.     Historical Provider, MD  atorvastatin (LIPITOR) 20 MG tablet Take 1 tablet (20 mg total) by mouth daily. 10/24/15   [name], MD  butalbital-acetaminophen-caffeine (FIORICET) [phone] MG tablet Take 1-2 tablets by mouth 2 (two) times daily as needed for headache. 07/10/15   [name], MD  cholecalciferol (VITAMIN D) 1000 units tablet Take 1 tablet (1,000 Units total) by mouth daily. 06/25/15   Evie Lacks Plotnikov, MD  irbesartan-hydrochlorothiazide (AVALIDE) 150-12.5 MG tablet Take 1 tablet by mouth daily. 10/24/15   Evie Lacks Plotnikov, MD  meclizine (ANTIVERT) 25 MG tablet Take 1 tablet (25 mg total) by mouth 3 (three) times daily as needed for dizziness. 06/19/15   Cameron Sprang, MD  Phosphatidylserine-DHA-EPA (VAYACOG) 100-19.5-6.5 MG CAPS Take 1 capsule by mouth daily. 06/25/15   Evie Lacks Plotnikov, MD   BP 187/77 mmHg  Pulse 88  Temp(Src) 98.3 F (36.8 C) (Oral)  Resp 20  SpO2 96%   Physical Exam  Constitutional: She is oriented to person, place, and time. She appears well-developed and well-nourished. No distress.  HENT:  Head: Normocephalic and  atraumatic.  Eyes: EOM are normal.  Neck: Neck supple.  Cardiovascular: Normal rate.   Pulmonary/Chest: Effort normal. No respiratory distress.  Abdominal: She exhibits no distension.  Musculoskeletal: Normal range of motion.  Small bite mark on palmar aspect of right pinky fingerpad. No active bleeding or drainage.   Neurological: She is alert and oriented to person, place, and time.  Skin: Skin is warm and dry.  Psychiatric: She has a normal mood and affect. Her behavior is normal.  Nursing note and vitals reviewed.   ED Course  Procedures (including critical care time) DIAGNOSTIC STUDIES: Oxygen Saturation is 96% on RA, nl by my  interpretation.    COORDINATION OF CARE: 4:19 PM Discussed treatment plan with pt at bedside which includes update tetanus, augmentin Rx, rabies treatment and pt agreed to plan.   Meds given in ED:  Medications  rabies immune globulin (HYPERAB) injection 1,500 Units (1,500 Units Intramuscular Given 11/20/15 1723)  rabies vaccine (RABAVERT) injection 1 mL (1 mL Intramuscular Given 11/20/15 1721)  Tdap (BOOSTRIX) injection 0.5 mL (0.5 mLs Intramuscular Given 11/20/15 1717)    Discharge Medication List as of 11/20/2015  5:47 PM    START taking these medications   Details  amoxicillin-clavulanate (AUGMENTIN) 875-125 MG tablet Take 1 tablet by mouth every 12 (twelve) hours., Starting 11/20/2015, Until Discontinued, Print         MDM   Final diagnoses:  Animal bite   70 year old female presents with a animal bite which is presumably a mole. Since it is unsure what animal it actually was, will initiate Rabies treatment. Wound has been irrigated and cleansed thoroughly already by patient. Tdap updated. Will also start antibiotic. Advised patient to return for second vaccination series in 3 days (7/14) and to return sooner if signs of infection develop. Patient and son verbalized understanding. Patient is NAD, non-toxic, with stable VS. Patient is informed of clinical course, understands medical decision making process, and agrees with plan. Opportunity for questions provided and all questions answered. Return precautions given.  I personally performed the services described in this documentation, which was scribed in my presence. The recorded information has been reviewed and is accurate.    Robin Evangelist, PA-C 11/21/15 1636  Merrily Pew, MD 11/23/15 2337

## 2015-11-23 ENCOUNTER — Encounter (HOSPITAL_COMMUNITY): Payer: Self-pay | Admitting: Emergency Medicine

## 2015-11-23 ENCOUNTER — Ambulatory Visit (HOSPITAL_COMMUNITY)
Admission: EM | Admit: 2015-11-23 | Discharge: 2015-11-23 | Disposition: A | Discharge status: Home / Self Care | Payer: Medicare HMO | Attending: Internal Medicine | Admitting: Internal Medicine

## 2015-11-23 DIAGNOSIS — Z203 Contact with and (suspected) exposure to rabies: Secondary | ICD-10-CM

## 2015-11-23 MED ORDER — RABIES VACCINE, PCEC IM SUSR
1.0000 mL | Freq: Once | INTRAMUSCULAR | Status: AC
  Administered 2015-11-23: 1 mL via INTRAMUSCULAR

## 2015-11-23 MED ORDER — RABIES VACCINE, PCEC IM SUSR
INTRAMUSCULAR | Status: AC
  Filled 2015-11-23: qty 1

## 2015-11-23 NOTE — ED Notes (Signed)
PT was bit by a mole Tuesday. PT received first round of rabies shots that day. PT received Rabavert.

## 2015-11-27 ENCOUNTER — Encounter (HOSPITAL_COMMUNITY): Payer: Self-pay | Admitting: Emergency Medicine

## 2015-11-27 ENCOUNTER — Ambulatory Visit (HOSPITAL_COMMUNITY)
Admission: EM | Admit: 2015-11-27 | Discharge: 2015-11-27 | Disposition: A | Discharge status: Home / Self Care | Payer: Medicare HMO | Attending: Physician Assistant | Admitting: Physician Assistant

## 2015-11-27 DIAGNOSIS — Z203 Contact with and (suspected) exposure to rabies: Secondary | ICD-10-CM | POA: Diagnosis not present

## 2015-11-27 MED ORDER — RABIES VACCINE, PCEC IM SUSR
1.0000 mL | Freq: Once | INTRAMUSCULAR | Status: AC
  Administered 2015-11-27: 1 mL via INTRAMUSCULAR

## 2015-11-27 MED ORDER — RABIES VACCINE, PCEC IM SUSR
INTRAMUSCULAR | Status: AC
  Filled 2015-11-27: qty 1

## 2015-11-27 NOTE — ED Notes (Signed)
PT is here for day 7 rabies series. PT was bitten by a mole last week.

## 2015-11-28 ENCOUNTER — Ambulatory Visit: Payer: BLUE CROSS/BLUE SHIELD | Admitting: Neurology

## 2015-11-30 ENCOUNTER — Ambulatory Visit: Payer: BLUE CROSS/BLUE SHIELD | Admitting: Neurology

## 2015-12-04 ENCOUNTER — Encounter (HOSPITAL_COMMUNITY): Payer: Self-pay | Admitting: Emergency Medicine

## 2015-12-04 ENCOUNTER — Ambulatory Visit (HOSPITAL_COMMUNITY)
Admission: EM | Admit: 2015-12-04 | Discharge: 2015-12-04 | Disposition: A | Discharge status: Home / Self Care | Payer: Medicare HMO | Attending: Family Medicine | Admitting: Family Medicine

## 2015-12-04 DIAGNOSIS — Z203 Contact with and (suspected) exposure to rabies: Secondary | ICD-10-CM

## 2015-12-04 MED ORDER — RABIES VACCINE, PCEC IM SUSR
INTRAMUSCULAR | Status: AC
  Filled 2015-12-04: qty 1

## 2015-12-04 MED ORDER — RABIES VACCINE, PCEC IM SUSR
1.0000 mL | Freq: Once | INTRAMUSCULAR | Status: AC
  Administered 2015-12-04: 1 mL via INTRAMUSCULAR

## 2015-12-04 NOTE — ED Notes (Signed)
PT does not wish to see an MD.

## 2015-12-04 NOTE — ED Triage Notes (Signed)
Pt here for last shot of rabies series

## 2015-12-27 ENCOUNTER — Ambulatory Visit (INDEPENDENT_AMBULATORY_CARE_PROVIDER_SITE_OTHER): Payer: Medicare HMO | Admitting: Neurology

## 2015-12-27 ENCOUNTER — Encounter: Payer: Self-pay | Admitting: Neurology

## 2015-12-27 VITALS — BP 144/76 | HR 74 | Temp 98.4°F | Ht 61.0 in | Wt 170.0 lb

## 2015-12-27 DIAGNOSIS — F0781 Postconcussional syndrome: Secondary | ICD-10-CM

## 2015-12-27 MED ORDER — AMITRIPTYLINE HCL 10 MG PO TABS: ORAL_TABLET | ORAL | 6 refills | Status: DC
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
## 2015-12-27 NOTE — Patient Instructions (Signed)
1. Start taking amitriptyline '10mg'$  every night: Take 1 tablet every night for 2 weeks, then increase to 2 tablets every night and continue 2. Follow-up in 4 months, call for any changes

## 2016-01-21 ENCOUNTER — Other Ambulatory Visit (INDEPENDENT_AMBULATORY_CARE_PROVIDER_SITE_OTHER): Payer: Medicare HMO

## 2016-01-21 DIAGNOSIS — I10 Essential (primary) hypertension: Secondary | ICD-10-CM

## 2016-01-21 DIAGNOSIS — E785 Hyperlipidemia, unspecified: Secondary | ICD-10-CM | POA: Diagnosis not present

## 2016-01-21 DIAGNOSIS — F0781 Postconcussional syndrome: Secondary | ICD-10-CM

## 2016-01-21 LAB — HEPATIC FUNCTION PANEL
ALBUMIN: 4.2 g/dL (ref 3.5–5.2)
ALK PHOS: 76 U/L (ref 39–117)
ALT: 30 U/L (ref 0–35)
AST: 24 U/L (ref 0–37)
Bilirubin, Direct: 0 mg/dL (ref 0.0–0.3)
TOTAL PROTEIN: 7.2 g/dL (ref 6.0–8.3)
Total Bilirubin: 0.4 mg/dL (ref 0.2–1.2)

## 2016-01-21 LAB — LIPID PANEL
CHOLESTEROL: 193 mg/dL (ref 0–200)
HDL: 53.4 mg/dL (ref >39.00)
LDL Cholesterol: 116 mg/dL — ABNORMAL HIGH (ref 0–99)
NONHDL: 140.05
Total CHOL/HDL Ratio: 4
Triglycerides: 119 mg/dL (ref 0.0–149.0)
VLDL: 23.8 mg/dL (ref 0.0–40.0)

## 2016-01-24 ENCOUNTER — Ambulatory Visit (INDEPENDENT_AMBULATORY_CARE_PROVIDER_SITE_OTHER): Payer: Medicare HMO | Admitting: Internal Medicine

## 2016-01-24 ENCOUNTER — Encounter: Payer: Self-pay | Admitting: Internal Medicine

## 2016-01-24 DIAGNOSIS — E559 Vitamin D deficiency, unspecified: Secondary | ICD-10-CM | POA: Diagnosis not present

## 2016-01-24 DIAGNOSIS — R453 Demoralization and apathy: Secondary | ICD-10-CM

## 2016-01-24 DIAGNOSIS — F0781 Postconcussional syndrome: Secondary | ICD-10-CM

## 2016-01-24 DIAGNOSIS — I10 Essential (primary) hypertension: Secondary | ICD-10-CM

## 2016-01-24 DIAGNOSIS — R5382 Chronic fatigue, unspecified: Secondary | ICD-10-CM

## 2016-01-24 DIAGNOSIS — E785 Hyperlipidemia, unspecified: Secondary | ICD-10-CM

## 2016-01-24 MED ORDER — BUTALBITAL-APAP-CAFFEINE 50-325-40 MG PO TABS: 1.0000 | ORAL_TABLET | Freq: Two times a day (BID) | ORAL | 1 refills | Status: DC | PRN

## 2016-01-24 MED ORDER — PHOSPHATIDYLSERINE-DHA-EPA 100-19.5-6.5 MG PO CAPS: 1.0000 | ORAL_CAPSULE | Freq: Every day | ORAL | 11 refills | Status: DC

## 2016-01-24 NOTE — Addendum Note (Signed)
Addended by: [name] on: 01/24/2016 10:04 AM   Modules accepted: Orders
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
## 2016-01-24 NOTE — Assessment & Plan Note (Signed)
On Vit D 

## 2016-01-24 NOTE — Assessment & Plan Note (Signed)
Better - on Lipitor

## 2016-01-24 NOTE — Progress Notes (Signed)
Pre visit review using our clinic review tool, if applicable. No additional management support is needed unless otherwise documented below in the visit note. 

## 2016-01-24 NOTE — Assessment & Plan Note (Signed)
On Alenolol

## 2016-01-24 NOTE — Assessment & Plan Note (Signed)
On Vyacog  Fioricet prn

## 2016-01-24 NOTE — Assessment & Plan Note (Signed)
Better  

## 2016-04-24 ENCOUNTER — Other Ambulatory Visit (INDEPENDENT_AMBULATORY_CARE_PROVIDER_SITE_OTHER): Payer: Medicare HMO

## 2016-04-24 DIAGNOSIS — E785 Hyperlipidemia, unspecified: Secondary | ICD-10-CM | POA: Diagnosis not present

## 2016-04-24 DIAGNOSIS — R5382 Chronic fatigue, unspecified: Secondary | ICD-10-CM

## 2016-04-24 DIAGNOSIS — I10 Essential (primary) hypertension: Secondary | ICD-10-CM | POA: Diagnosis not present

## 2016-04-24 DIAGNOSIS — F0781 Postconcussional syndrome: Secondary | ICD-10-CM | POA: Diagnosis not present

## 2016-04-24 DIAGNOSIS — R69 Illness, unspecified: Secondary | ICD-10-CM | POA: Diagnosis not present

## 2016-04-24 LAB — LIPID PANEL
Cholesterol: 232 mg/dL — ABNORMAL HIGH (ref 0–200)
HDL: 57.4 mg/dL (ref >39.00)
LDL CALC: 152 mg/dL — AB (ref 0–99)
NONHDL: 174.72
Total CHOL/HDL Ratio: 4
Triglycerides: 116 mg/dL (ref 0.0–149.0)
VLDL: 23.2 mg/dL (ref 0.0–40.0)

## 2016-04-24 LAB — BASIC METABOLIC PANEL
BUN: 18 mg/dL (ref 6–23)
CHLORIDE: 108 meq/L (ref 96–112)
CO2: 29 meq/L (ref 19–32)
Calcium: 9.2 mg/dL (ref 8.4–10.5)
Creatinine, Ser: 0.84 mg/dL (ref 0.40–1.20)
GFR: 70.91 mL/min (ref >60.00)
GLUCOSE: 116 mg/dL — AB (ref 70–99)
POTASSIUM: 4.5 meq/L (ref 3.5–5.1)
SODIUM: 143 meq/L (ref 135–145)

## 2016-04-24 LAB — HEPATIC FUNCTION PANEL
ALBUMIN: 4.2 g/dL (ref 3.5–5.2)
ALT: 37 U/L — ABNORMAL HIGH (ref 0–35)
AST: 24 U/L (ref 0–37)
Alkaline Phosphatase: 78 U/L (ref 39–117)
Bilirubin, Direct: 0.1 mg/dL (ref 0.0–0.3)
Total Bilirubin: 0.5 mg/dL (ref 0.2–1.2)
Total Protein: 6.8 g/dL (ref 6.0–8.3)

## 2016-04-24 LAB — SEDIMENTATION RATE: SED RATE: 21 mm/h (ref 0–30)

## 2016-04-24 LAB — TSH: TSH: 2.3 u[IU]/mL (ref 0.35–4.50)

## 2016-04-25 ENCOUNTER — Ambulatory Visit (INDEPENDENT_AMBULATORY_CARE_PROVIDER_SITE_OTHER): Payer: Medicare HMO | Admitting: Internal Medicine

## 2016-04-25 ENCOUNTER — Encounter: Payer: Self-pay | Admitting: Internal Medicine

## 2016-04-25 VITALS — BP 150/90 | HR 69 | Wt 173.0 lb

## 2016-04-25 DIAGNOSIS — I10 Essential (primary) hypertension: Secondary | ICD-10-CM

## 2016-04-25 DIAGNOSIS — R69 Illness, unspecified: Secondary | ICD-10-CM | POA: Diagnosis not present

## 2016-04-25 DIAGNOSIS — R739 Hyperglycemia, unspecified: Secondary | ICD-10-CM | POA: Diagnosis not present

## 2016-04-25 DIAGNOSIS — F0781 Postconcussional syndrome: Secondary | ICD-10-CM | POA: Diagnosis not present

## 2016-04-25 DIAGNOSIS — E785 Hyperlipidemia, unspecified: Secondary | ICD-10-CM | POA: Diagnosis not present

## 2016-04-25 MED ORDER — LOSARTAN POTASSIUM 100 MG PO TABS: 100.0000 mg | ORAL_TABLET | Freq: Every day | ORAL | 11 refills | Status: DC

## 2016-04-25 MED ORDER — HYDROCHLOROTHIAZIDE 12.5 MG PO CAPS: 12.5000 mg | ORAL_CAPSULE | Freq: Every day | ORAL | 11 refills | Status: DC

## 2016-04-25 MED ORDER — PRAVASTATIN SODIUM 20 MG PO TABS: 20.0000 mg | ORAL_TABLET | Freq: Every day | ORAL | 11 refills | Status: DC

## 2016-04-25 NOTE — Assessment & Plan Note (Signed)
Losartan, Maxzide - restart

## 2016-04-25 NOTE — Assessment & Plan Note (Signed)
Cont w/Vayacog

## 2016-04-25 NOTE — Assessment & Plan Note (Signed)
Worse off Lipitor Stopped lipitor due to nausea

## 2016-04-25 NOTE — Progress Notes (Signed)
Subjective:  Patient ID: Robin Estrada, female    DOB: 1945-10-02  Age: 70 y.o. MRN: [id number]  CC: No chief complaint on file.   HPI Robin Estrada presents for post-concussion syndrome, insomnia, HTN f/u. HAs and dizziness are better  Outpatient Medications Prior to Visit  Medication Sig Dispense Refill  . amitriptyline (ELAVIL) 10 MG tablet Take 1 tablet every night for 2 weeks, then increase to 2 tablets every night and continue 60 tablet 6  . aspirin 81 MG tablet Take 81 mg by mouth daily.    Marland Kitchen atenolol (TENORMIN) 50 MG tablet Take 50 mg by mouth 2 (two) times daily.     Marland Kitchen atorvastatin (LIPITOR) 20 MG tablet Take 1 tablet (20 mg total) by mouth daily. 90 tablet 3  . butalbital-acetaminophen-caffeine (FIORICET) 50-325-40 MG tablet Take 1-2 tablets by mouth 2 (two) times daily as needed for headache. 60 tablet 1  . cholecalciferol (VITAMIN D) 1000 units tablet Take 1 tablet (1,000 Units total) by mouth daily. 30 tablet 11  . irbesartan-hydrochlorothiazide (AVALIDE) 150-12.5 MG tablet Take 1 tablet by mouth daily. 90 tablet 3  . meclizine (ANTIVERT) 25 MG tablet Take 1 tablet (25 mg total) by mouth 3 (three) times daily as needed for dizziness. 30 tablet 4  . Phosphatidylserine-DHA-EPA (VAYACOG) 100-19.5-6.5 MG CAPS Take 1 capsule by mouth daily. 30 capsule 11   No facility-administered medications prior to visit.     ROS Review of Systems  Constitutional: Negative for activity change, appetite change, chills, fatigue and unexpected weight change.  HENT: Negative for congestion, mouth sores and sinus pressure.   Eyes: Negative for visual disturbance.  Respiratory: Negative for cough and chest tightness.   Gastrointestinal: Negative for abdominal pain and nausea.  Genitourinary: Negative for difficulty urinating, frequency and vaginal pain.  Musculoskeletal: Negative for back pain and gait problem.  Skin: Negative for pallor and rash.  Neurological: Positive for dizziness and  headaches. Negative for tremors, weakness and numbness.  Psychiatric/Behavioral: Positive for decreased concentration. Negative for confusion and sleep disturbance.    Objective:  BP (!) 150/90   Pulse 69   Wt 173 lb (78.5 kg)   SpO2 96%   BMI 32.69 kg/m   BP Readings from Last 3 Encounters:  04/25/16 (!) 150/90  01/24/16 138/64  12/27/15 (!) 144/76    Wt Readings from Last 3 Encounters:  04/25/16 173 lb (78.5 kg)  01/24/16 172 lb 1.9 oz (78.1 kg)  12/27/15 170 lb (77.1 kg)    Physical Exam  Constitutional: She appears well-developed. No distress.  HENT:  Head: Normocephalic.  Right Ear: External ear normal.  Left Ear: External ear normal.  Nose: Nose normal.  Mouth/Throat: Oropharynx is clear and moist.  Eyes: Conjunctivae are normal. Pupils are equal, round, and reactive to light. Right eye exhibits no discharge. Left eye exhibits no discharge.  Neck: Normal range of motion. Neck supple. No JVD present. No tracheal deviation present. No thyromegaly present.  Cardiovascular: Normal rate, regular rhythm and normal heart sounds.   Pulmonary/Chest: No stridor. No respiratory distress. She has no wheezes.  Abdominal: Soft. Bowel sounds are normal. She exhibits no distension and no mass. There is no tenderness. There is no rebound and no guarding.  Musculoskeletal: She exhibits no edema or tenderness.  Lymphadenopathy:    She has no cervical adenopathy.  Neurological: She displays normal reflexes. No cranial nerve deficit. She exhibits normal muscle tone. Coordination normal.  Skin: No rash noted. No erythema.  Psychiatric: She has  a normal mood and affect. Her behavior is normal. Judgment and thought content normal.    Lab Results  Component Value Date   WBC 6.7 06/25/2015   HGB 13.5 06/25/2015   HCT 40.5 06/25/2015   PLT 287.0 06/25/2015   GLUCOSE 116 (H) 04/24/2016   CHOL 232 (H) 04/24/2016   TRIG 116.0 04/24/2016   HDL 57.40 04/24/2016   LDLCALC 152 (H)  04/24/2016   ALT 37 (H) 04/24/2016   AST 24 04/24/2016   NA 143 04/24/2016   K 4.5 04/24/2016   CL 108 04/24/2016   CREATININE 0.84 04/24/2016   BUN 18 04/24/2016   CO2 29 04/24/2016   TSH 2.30 04/24/2016    No results found.  Assessment & Plan:   There are no diagnoses linked to this encounter. I am having Robin Estrada maintain her atenolol, aspirin, meclizine, cholecalciferol, atorvastatin, irbesartan-hydrochlorothiazide, amitriptyline, Phosphatidylserine-DHA-EPA, and butalbital-acetaminophen-caffeine.  No orders of the defined types were placed in this encounter.    Follow-up: No Follow-up on file.  Walker Kehr, MD

## 2016-04-28 ENCOUNTER — Encounter: Payer: Self-pay | Admitting: Neurology

## 2016-04-28 ENCOUNTER — Ambulatory Visit (INDEPENDENT_AMBULATORY_CARE_PROVIDER_SITE_OTHER): Payer: Medicare HMO | Admitting: Neurology

## 2016-04-28 VITALS — BP 146/80 | HR 79 | Ht 61.0 in | Wt 171.5 lb

## 2016-04-28 DIAGNOSIS — R69 Illness, unspecified: Secondary | ICD-10-CM | POA: Diagnosis not present

## 2016-04-28 DIAGNOSIS — F0781 Postconcussional syndrome: Secondary | ICD-10-CM

## 2016-04-28 MED ORDER — AMITRIPTYLINE HCL 10 MG PO TABS: ORAL_TABLET | ORAL | 3 refills | Status: DC
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
## 2016-04-28 NOTE — Patient Instructions (Signed)
1. Continue amitriptyline '10mg'$ , take 1 tablet every night 2. Follow-up in 6 months, call for any changes

## 2016-05-15 DIAGNOSIS — H353132 Nonexudative age-related macular degeneration, bilateral, intermediate dry stage: Secondary | ICD-10-CM | POA: Diagnosis not present

## 2016-05-15 DIAGNOSIS — H40021 Open angle with borderline findings, high risk, right eye: Secondary | ICD-10-CM | POA: Diagnosis not present

## 2016-05-15 DIAGNOSIS — H25013 Cortical age-related cataract, bilateral: Secondary | ICD-10-CM | POA: Diagnosis not present

## 2016-05-15 DIAGNOSIS — H2513 Age-related nuclear cataract, bilateral: Secondary | ICD-10-CM | POA: Diagnosis not present

## 2016-05-15 DIAGNOSIS — H2589 Other age-related cataract: Secondary | ICD-10-CM | POA: Diagnosis not present

## 2016-07-31 ENCOUNTER — Other Ambulatory Visit (INDEPENDENT_AMBULATORY_CARE_PROVIDER_SITE_OTHER): Payer: Medicare HMO

## 2016-07-31 DIAGNOSIS — E785 Hyperlipidemia, unspecified: Secondary | ICD-10-CM | POA: Diagnosis not present

## 2016-07-31 DIAGNOSIS — R739 Hyperglycemia, unspecified: Secondary | ICD-10-CM

## 2016-07-31 LAB — BASIC METABOLIC PANEL
BUN: 14 mg/dL (ref 6–23)
CALCIUM: 9.6 mg/dL (ref 8.4–10.5)
CO2: 29 meq/L (ref 19–32)
CREATININE: 0.78 mg/dL (ref 0.40–1.20)
Chloride: 103 mEq/L (ref 96–112)
GFR: 77.18 mL/min (ref >60.00)
Glucose, Bld: 113 mg/dL — ABNORMAL HIGH (ref 70–99)
Potassium: 4.4 mEq/L (ref 3.5–5.1)
SODIUM: 140 meq/L (ref 135–145)

## 2016-07-31 LAB — HEPATIC FUNCTION PANEL
ALK PHOS: 82 U/L (ref 39–117)
ALT: 27 U/L (ref 0–35)
AST: 21 U/L (ref 0–37)
Albumin: 4.3 g/dL (ref 3.5–5.2)
BILIRUBIN DIRECT: 0.2 mg/dL (ref 0.0–0.3)
BILIRUBIN TOTAL: 0.4 mg/dL (ref 0.2–1.2)
Total Protein: 7.2 g/dL (ref 6.0–8.3)

## 2016-07-31 LAB — LIPID PANEL
CHOL/HDL RATIO: 4
Cholesterol: 243 mg/dL — ABNORMAL HIGH (ref 0–200)
HDL: 61.9 mg/dL (ref >39.00)
LDL Cholesterol: 157 mg/dL — ABNORMAL HIGH (ref 0–99)
NonHDL: 180.66
TRIGLYCERIDES: 119 mg/dL (ref 0.0–149.0)
VLDL: 23.8 mg/dL (ref 0.0–40.0)

## 2016-07-31 LAB — HEMOGLOBIN A1C: Hgb A1c MFr Bld: 6.3 % (ref 4.6–6.5)

## 2016-08-01 ENCOUNTER — Ambulatory Visit (INDEPENDENT_AMBULATORY_CARE_PROVIDER_SITE_OTHER): Payer: Medicare HMO | Admitting: Internal Medicine

## 2016-08-01 ENCOUNTER — Encounter: Payer: Self-pay | Admitting: Internal Medicine

## 2016-08-01 DIAGNOSIS — I341 Nonrheumatic mitral (valve) prolapse: Secondary | ICD-10-CM | POA: Diagnosis not present

## 2016-08-01 DIAGNOSIS — I10 Essential (primary) hypertension: Secondary | ICD-10-CM

## 2016-08-01 DIAGNOSIS — E559 Vitamin D deficiency, unspecified: Secondary | ICD-10-CM

## 2016-08-01 DIAGNOSIS — R413 Other amnesia: Secondary | ICD-10-CM | POA: Diagnosis not present

## 2016-08-01 DIAGNOSIS — E785 Hyperlipidemia, unspecified: Secondary | ICD-10-CM

## 2016-08-01 MED ORDER — VITAMIN D 1000 UNITS PO TABS: 1000.0000 [IU] | ORAL_TABLET | Freq: Every day | ORAL | 3 refills | Status: DC

## 2016-08-01 MED ORDER — LOSARTAN POTASSIUM 100 MG PO TABS: 100.0000 mg | ORAL_TABLET | Freq: Every day | ORAL | 11 refills | Status: DC

## 2016-08-01 NOTE — Progress Notes (Signed)
Subjective:  Patient ID: Robin Estrada, female    DOB: 1945/12/03  Age: 71 y.o. MRN: [id number]  CC: Hypertension and Hyperglycemia   HPI Robin Estrada presents for post-concussive syndrome. A little better - able to work from home 1-4 h a day with breaks. Focus is down yet - - slow processing. F/u HTN, elev glu   Outpatient Medications Prior to Visit  Medication Sig Dispense Refill  . amitriptyline (ELAVIL) 10 MG tablet Take 1 tablet every night 90 tablet 3  . aspirin 81 MG tablet Take 81 mg by mouth daily.    Marland Kitchen atenolol (TENORMIN) 50 MG tablet Take 50 mg by mouth 2 (two) times daily.     . butalbital-acetaminophen-caffeine (FIORICET) 50-325-40 MG tablet Take 1-2 tablets by mouth 2 (two) times daily as needed for headache. 60 tablet 1  . cholecalciferol (VITAMIN D) 1000 units tablet Take 1 tablet (1,000 Units total) by mouth daily. 30 tablet 11  . hydrochlorothiazide (MICROZIDE) 12.5 MG capsule Take 1 capsule (12.5 mg total) by mouth daily. 30 capsule 11  . losartan (COZAAR) 100 MG tablet Take 1 tablet (100 mg total) by mouth daily. 30 tablet 11  . meclizine (ANTIVERT) 25 MG tablet Take 1 tablet (25 mg total) by mouth 3 (three) times daily as needed for dizziness. 30 tablet 4  . Phosphatidylserine-DHA-EPA (VAYACOG) 100-19.5-6.5 MG CAPS Take 1 capsule by mouth daily. 30 capsule 11  . pravastatin (PRAVACHOL) 20 MG tablet Take 1 tablet (20 mg total) by mouth daily. 30 tablet 11   No facility-administered medications prior to visit.     ROS Review of Systems  Constitutional: Positive for fatigue. Negative for activity change, appetite change, chills and unexpected weight change.  HENT: Negative for congestion, mouth sores and sinus pressure.   Eyes: Negative for visual disturbance.  Respiratory: Negative for cough and chest tightness.   Gastrointestinal: Negative for abdominal pain and nausea.  Genitourinary: Negative for difficulty urinating, frequency and vaginal pain.    Musculoskeletal: Negative for back pain and gait problem.  Skin: Negative for pallor and rash.  Neurological: Negative for dizziness, tremors, weakness, numbness and headaches.  Psychiatric/Behavioral: Positive for decreased concentration. Negative for confusion and sleep disturbance.    Objective:  BP 140/88   Pulse 76   Temp 97.7 F (36.5 C) (Oral)   Resp 16   Ht '5\' 1"'$  (1.549 m)   Wt 173 lb 8 oz (78.7 kg)   SpO2 96%   BMI 32.78 kg/m   BP Readings from Last 3 Encounters:  08/01/16 140/88  04/28/16 (!) 146/80  04/25/16 (!) 150/90    Wt Readings from Last 3 Encounters:  08/01/16 173 lb 8 oz (78.7 kg)  04/28/16 171 lb 8 oz (77.8 kg)  04/25/16 173 lb (78.5 kg)    Physical Exam  Constitutional: She appears well-developed. No distress.  HENT:  Head: Normocephalic.  Right Ear: External ear normal.  Left Ear: External ear normal.  Nose: Nose normal.  Mouth/Throat: Oropharynx is clear and moist.  Eyes: Conjunctivae are normal. Pupils are equal, round, and reactive to light. Right eye exhibits no discharge. Left eye exhibits no discharge.  Neck: Normal range of motion. Neck supple. No JVD present. No tracheal deviation present. No thyromegaly present.  Cardiovascular: Normal rate and regular rhythm.   Murmur heard. Pulmonary/Chest: No stridor. No respiratory distress. She has no wheezes.  Abdominal: Soft. Bowel sounds are normal. She exhibits no distension and no mass. There is no tenderness. There is no rebound  and no guarding.  Musculoskeletal: She exhibits no edema or tenderness.  Lymphadenopathy:    She has no cervical adenopathy.  Neurological: She displays normal reflexes. No cranial nerve deficit. She exhibits normal muscle tone. Coordination normal.  Skin: No rash noted. No erythema.  Psychiatric: She has a normal mood and affect. Her behavior is normal. Judgment and thought content normal.    Lab Results  Component Value Date   WBC 6.7 06/25/2015   HGB 13.5  06/25/2015   HCT 40.5 06/25/2015   PLT 287.0 06/25/2015   GLUCOSE 113 (H) 07/31/2016   CHOL 243 (H) 07/31/2016   TRIG 119.0 07/31/2016   HDL 61.90 07/31/2016   LDLCALC 157 (H) 07/31/2016   ALT 27 07/31/2016   AST 21 07/31/2016   NA 140 07/31/2016   K 4.4 07/31/2016   CL 103 07/31/2016   CREATININE 0.78 07/31/2016   BUN 14 07/31/2016   CO2 29 07/31/2016   TSH 2.30 04/24/2016   HGBA1C 6.3 07/31/2016    No results found.  Assessment & Plan:   There are no diagnoses linked to this encounter. I am having Ms. Motta maintain her atenolol, aspirin, meclizine, cholecalciferol, Phosphatidylserine-DHA-EPA, butalbital-acetaminophen-caffeine, losartan, hydrochlorothiazide, pravastatin, and amitriptyline.  No orders of the defined types were placed in this encounter.    Follow-up: No Follow-up on file.  Walker Kehr, MD

## 2016-08-01 NOTE — Assessment & Plan Note (Signed)
Remote - ?rheumatic Pr declined ECHO

## 2016-08-01 NOTE — Assessment & Plan Note (Signed)
On Vit D 
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
## 2016-08-01 NOTE — Progress Notes (Signed)
Pre-visit discussion using our clinic review tool. No additional management support is needed unless otherwise documented below in the visit note.  

## 2016-08-01 NOTE — Assessment & Plan Note (Signed)
Alenolol,  Losartan, Maxzide

## 2016-08-01 NOTE — Assessment & Plan Note (Signed)
Statin intolerant D/c Pravastatin

## 2016-10-31 ENCOUNTER — Ambulatory Visit (INDEPENDENT_AMBULATORY_CARE_PROVIDER_SITE_OTHER): Payer: Medicare HMO | Admitting: Neurology

## 2016-10-31 ENCOUNTER — Encounter: Payer: Self-pay | Admitting: Neurology

## 2016-10-31 VITALS — BP 132/74 | HR 68 | Ht 61.0 in | Wt 172.0 lb

## 2016-10-31 DIAGNOSIS — F0781 Postconcussional syndrome: Secondary | ICD-10-CM | POA: Diagnosis not present

## 2016-10-31 DIAGNOSIS — R69 Illness, unspecified: Secondary | ICD-10-CM | POA: Diagnosis not present

## 2016-10-31 NOTE — Patient Instructions (Signed)
1. Stop amitriptyline 2. See how you feel over the next 2 weeks and call our office for update. If headaches are worsening, we will start a different medication 3. Minimize Tylenol, Fioricet (butalbital) intake to 2-3 times a week, otherwise headaches may worsen 4. Follow-up in 5 months, call for any changes
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
## 2016-11-21 DIAGNOSIS — H268 Other specified cataract: Secondary | ICD-10-CM | POA: Diagnosis not present

## 2016-11-21 DIAGNOSIS — H5703 Miosis: Secondary | ICD-10-CM | POA: Diagnosis not present

## 2016-11-21 DIAGNOSIS — H25013 Cortical age-related cataract, bilateral: Secondary | ICD-10-CM | POA: Diagnosis not present

## 2016-11-21 DIAGNOSIS — H40021 Open angle with borderline findings, high risk, right eye: Secondary | ICD-10-CM | POA: Diagnosis not present

## 2016-11-21 DIAGNOSIS — H2513 Age-related nuclear cataract, bilateral: Secondary | ICD-10-CM | POA: Diagnosis not present

## 2016-11-21 DIAGNOSIS — H353132 Nonexudative age-related macular degeneration, bilateral, intermediate dry stage: Secondary | ICD-10-CM | POA: Diagnosis not present

## 2016-12-09 ENCOUNTER — Telehealth: Payer: Self-pay | Admitting: Internal Medicine

## 2016-12-09 DIAGNOSIS — I10 Essential (primary) hypertension: Secondary | ICD-10-CM

## 2016-12-09 DIAGNOSIS — R7309 Other abnormal glucose: Secondary | ICD-10-CM

## 2016-12-09 NOTE — Telephone Encounter (Signed)
Please advise 

## 2016-12-09 NOTE — Telephone Encounter (Signed)
Pt son called stating normally the Pt does blood work before the appt, they would like to know if this needs to be put in and if so needs to be notified

## 2016-12-10 NOTE — Telephone Encounter (Signed)
OK BMET, A1c, lipids Thx

## 2016-12-10 NOTE — Telephone Encounter (Signed)
Orders entered, LM notifying son

## 2016-12-11 ENCOUNTER — Other Ambulatory Visit (INDEPENDENT_AMBULATORY_CARE_PROVIDER_SITE_OTHER): Payer: Medicare HMO

## 2016-12-11 DIAGNOSIS — R7309 Other abnormal glucose: Secondary | ICD-10-CM

## 2016-12-11 DIAGNOSIS — I10 Essential (primary) hypertension: Secondary | ICD-10-CM | POA: Diagnosis not present

## 2016-12-11 LAB — BASIC METABOLIC PANEL
BUN: 12 mg/dL (ref 6–23)
CO2: 29 mEq/L (ref 19–32)
Calcium: 9.3 mg/dL (ref 8.4–10.5)
Chloride: 105 mEq/L (ref 96–112)
Creatinine, Ser: 0.84 mg/dL (ref 0.40–1.20)
GFR: 70.78 mL/min (ref >60.00)
Glucose, Bld: 115 mg/dL — ABNORMAL HIGH (ref 70–99)
POTASSIUM: 4.6 meq/L (ref 3.5–5.1)
SODIUM: 141 meq/L (ref 135–145)

## 2016-12-11 LAB — LIPID PANEL
Cholesterol: 249 mg/dL — ABNORMAL HIGH (ref 0–200)
HDL: 52.9 mg/dL (ref >39.00)
LDL Cholesterol: 164 mg/dL — ABNORMAL HIGH (ref 0–99)
NONHDL: 196.03
TRIGLYCERIDES: 162 mg/dL — AB (ref 0.0–149.0)
Total CHOL/HDL Ratio: 5
VLDL: 32.4 mg/dL (ref 0.0–40.0)

## 2016-12-11 LAB — HEMOGLOBIN A1C: HEMOGLOBIN A1C: 6.3 % (ref 4.6–6.5)

## 2016-12-12 ENCOUNTER — Encounter: Payer: Self-pay | Admitting: Internal Medicine

## 2016-12-12 ENCOUNTER — Ambulatory Visit (INDEPENDENT_AMBULATORY_CARE_PROVIDER_SITE_OTHER): Payer: Medicare HMO | Admitting: Internal Medicine

## 2016-12-12 DIAGNOSIS — R413 Other amnesia: Secondary | ICD-10-CM

## 2016-12-12 DIAGNOSIS — E785 Hyperlipidemia, unspecified: Secondary | ICD-10-CM

## 2016-12-12 DIAGNOSIS — I10 Essential (primary) hypertension: Secondary | ICD-10-CM | POA: Diagnosis not present

## 2016-12-12 DIAGNOSIS — E559 Vitamin D deficiency, unspecified: Secondary | ICD-10-CM

## 2016-12-12 MED ORDER — LOSARTAN POTASSIUM 50 MG PO TABS: 50.0000 mg | ORAL_TABLET | Freq: Every day | ORAL | 11 refills | Status: DC

## 2016-12-12 MED ORDER — ICOSAPENT ETHYL 1 G PO CAPS: 2.0000 | ORAL_CAPSULE | Freq: Two times a day (BID) | ORAL | 11 refills | Status: DC

## 2016-12-12 NOTE — Assessment & Plan Note (Signed)
HAs w/mental activities Working 4 h/d with frequent breaks

## 2016-12-12 NOTE — Assessment & Plan Note (Signed)
Vit D 

## 2016-12-12 NOTE — Assessment & Plan Note (Signed)
F/u w/Neurology HAs w/mental activities

## 2016-12-12 NOTE — Assessment & Plan Note (Signed)
Alenolol,  Losartan

## 2016-12-12 NOTE — Assessment & Plan Note (Addendum)
Pt declined statins Try Vascepa

## 2016-12-12 NOTE — Progress Notes (Signed)
Subjective:  Patient ID: Robin Estrada, female    DOB: 04-30-1946  Age: 71 y.o. MRN: [id number]  CC: No chief complaint on file.   HPI Robin Estrada presents for HA, HTN, dyslipidemia, vertigo f/u Pt wants to take 50 mg Losartan  Outpatient Medications Prior to Visit  Medication Sig Dispense Refill  . aspirin 81 MG tablet Take 81 mg by mouth daily.    Marland Kitchen atenolol (TENORMIN) 50 MG tablet Take 50 mg by mouth 2 (two) times daily.     . butalbital-acetaminophen-caffeine (FIORICET) 50-325-40 MG tablet Take 1-2 tablets by mouth 2 (two) times daily as needed for headache. 60 tablet 1  . cholecalciferol (VITAMIN D) 1000 units tablet Take 1 tablet (1,000 Units total) by mouth daily. 100 tablet 3  . losartan (COZAAR) 100 MG tablet Take 1 tablet (100 mg total) by mouth daily. 30 tablet 11  . hydrochlorothiazide (MICROZIDE) 12.5 MG capsule Take 1 capsule (12.5 mg total) by mouth daily. 30 capsule 11  . meclizine (ANTIVERT) 25 MG tablet Take 1 tablet (25 mg total) by mouth 3 (three) times daily as needed for dizziness. 30 tablet 4   No facility-administered medications prior to visit.     ROS Review of Systems  Constitutional: Positive for fatigue. Negative for activity change, appetite change, chills and unexpected weight change.  HENT: Negative for congestion, mouth sores and sinus pressure.   Eyes: Negative for visual disturbance.  Respiratory: Negative for cough and chest tightness.   Gastrointestinal: Negative for abdominal pain and nausea.  Genitourinary: Negative for difficulty urinating, frequency and vaginal pain.  Musculoskeletal: Negative for back pain and gait problem.  Skin: Negative for pallor and rash.  Neurological: Positive for dizziness and headaches. Negative for tremors, facial asymmetry, weakness and numbness.  Psychiatric/Behavioral: Positive for decreased concentration. Negative for confusion and sleep disturbance.    Objective:  BP 130/80 (BP Location: Left Arm,  Patient Position: Sitting, Cuff Size: Large)   Pulse 68   Temp 97.8 F (36.6 C) (Oral)   Ht 5\' 1"  (1.549 m)   Wt 175 lb (79.4 kg)   SpO2 97%   BMI 33.07 kg/m   BP Readings from Last 3 Encounters:  12/12/16 130/80  10/31/16 132/74  08/01/16 140/88    Wt Readings from Last 3 Encounters:  12/12/16 175 lb (79.4 kg)  10/31/16 172 lb (78 kg)  08/01/16 173 lb 8 oz (78.7 kg)    Physical Exam  Constitutional: She appears well-developed. No distress.  HENT:  Head: Normocephalic.  Right Ear: External ear normal.  Left Ear: External ear normal.  Nose: Nose normal.  Mouth/Throat: Oropharynx is clear and moist.  Eyes: Pupils are equal, round, and reactive to light. Conjunctivae are normal. Right eye exhibits no discharge. Left eye exhibits no discharge.  Neck: Normal range of motion. Neck supple. No JVD present. No tracheal deviation present. No thyromegaly present.  Cardiovascular: Normal rate, regular rhythm and normal heart sounds.   Pulmonary/Chest: No stridor. No respiratory distress. She has no wheezes.  Abdominal: Soft. Bowel sounds are normal. She exhibits no distension and no mass. There is no tenderness. There is no rebound and no guarding.  Musculoskeletal: She exhibits no edema or tenderness.  Lymphadenopathy:    She has no cervical adenopathy.  Neurological: She displays normal reflexes. No cranial nerve deficit. She exhibits normal muscle tone. Coordination normal.  Skin: No rash noted. No erythema.  Psychiatric: She has a normal mood and affect. Her behavior is normal. Judgment and thought  content normal.    Lab Results  Component Value Date   WBC 6.7 06/25/2015   HGB 13.5 06/25/2015   HCT 40.5 06/25/2015   PLT 287.0 06/25/2015   GLUCOSE 115 (H) 12/11/2016   CHOL 249 (H) 12/11/2016   TRIG 162.0 (H) 12/11/2016   HDL 52.90 12/11/2016   LDLCALC 164 (H) 12/11/2016   ALT 27 07/31/2016   AST 21 07/31/2016   NA 141 12/11/2016   K 4.6 12/11/2016   CL 105 12/11/2016     CREATININE 0.84 12/11/2016   BUN 12 12/11/2016   CO2 29 12/11/2016   TSH 2.30 04/24/2016   HGBA1C 6.3 12/11/2016    No results found.  Assessment & Plan:   There are no diagnoses linked to this encounter. I have discontinued Robin Estrada's meclizine and hydrochlorothiazide. I am also having her maintain her atenolol, aspirin, butalbital-acetaminophen-caffeine, losartan, and cholecalciferol.  No orders of the defined types were placed in this encounter.    Follow-up: No Follow-up on file.  Walker Kehr, MD

## 2017-04-17 ENCOUNTER — Encounter: Payer: Self-pay | Admitting: Internal Medicine

## 2017-04-17 ENCOUNTER — Ambulatory Visit: Payer: Medicare HMO | Admitting: Internal Medicine

## 2017-04-17 VITALS — BP 136/84 | HR 73 | Temp 98.0°F | Ht 61.0 in | Wt 172.0 lb

## 2017-04-17 DIAGNOSIS — Z1211 Encounter for screening for malignant neoplasm of colon: Secondary | ICD-10-CM | POA: Diagnosis not present

## 2017-04-17 DIAGNOSIS — I10 Essential (primary) hypertension: Secondary | ICD-10-CM | POA: Diagnosis not present

## 2017-04-17 DIAGNOSIS — R413 Other amnesia: Secondary | ICD-10-CM

## 2017-04-17 DIAGNOSIS — R5382 Chronic fatigue, unspecified: Secondary | ICD-10-CM

## 2017-04-17 DIAGNOSIS — E559 Vitamin D deficiency, unspecified: Secondary | ICD-10-CM

## 2017-04-17 DIAGNOSIS — Z1239 Encounter for other screening for malignant neoplasm of breast: Secondary | ICD-10-CM

## 2017-04-17 DIAGNOSIS — Z1231 Encounter for screening mammogram for malignant neoplasm of breast: Secondary | ICD-10-CM

## 2017-04-17 DIAGNOSIS — R7309 Other abnormal glucose: Secondary | ICD-10-CM

## 2017-04-17 NOTE — Addendum Note (Signed)
Addended by: [name] on: 04/17/2017 10:15 AM   Modules accepted: Orders
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
## 2017-04-17 NOTE — Progress Notes (Signed)
Subjective:  Patient ID: Robin Estrada, female    DOB: 1946/04/04  Age: 71 y.o. MRN: [id number]  CC: No chief complaint on file.   HPI Amoura Ransier presents for post-concussion HA's, fatigue - a little better F/u HTN  Outpatient Medications Prior to Visit  Medication Sig Dispense Refill  . aspirin 81 MG tablet Take 81 mg by mouth daily.    Marland Kitchen atenolol (TENORMIN) 50 MG tablet Take 50 mg by mouth 2 (two) times daily.     . butalbital-acetaminophen-caffeine (FIORICET) 50-325-40 MG tablet Take 1-2 tablets by mouth 2 (two) times daily as needed for headache. 60 tablet 1  . cholecalciferol (VITAMIN D) 1000 units tablet Take 1 tablet (1,000 Units total) by mouth daily. 100 tablet 3  . Icosapent Ethyl (VASCEPA) 1 g CAPS Take 2 capsules by mouth 2 (two) times daily. 120 capsule 11  . losartan (COZAAR) 50 MG tablet Take 1 tablet (50 mg total) by mouth daily. 30 tablet 11   No facility-administered medications prior to visit.     ROS Review of Systems  Constitutional: Positive for fatigue. Negative for activity change, appetite change, chills and unexpected weight change.  HENT: Negative for congestion, mouth sores and sinus pressure.   Eyes: Negative for visual disturbance.  Respiratory: Negative for cough and chest tightness.   Gastrointestinal: Negative for abdominal pain and nausea.  Genitourinary: Negative for difficulty urinating, frequency and vaginal pain.  Musculoskeletal: Negative for back pain and gait problem.  Skin: Negative for pallor and rash.  Neurological: Positive for headaches. Negative for dizziness, tremors, weakness and numbness.  Psychiatric/Behavioral: Negative for confusion and sleep disturbance.    Objective:  BP 136/84 (BP Location: Left Arm, Patient Position: Sitting, Cuff Size: Large)   Pulse 73   Temp 98 F (36.7 C) (Oral)   Ht 5\' 1"  (1.549 m)   Wt 172 lb (78 kg)   SpO2 99%   BMI 32.50 kg/m   BP Readings from Last 3 Encounters:  04/17/17 136/84    12/12/16 130/80  10/31/16 132/74    Wt Readings from Last 3 Encounters:  04/17/17 172 lb (78 kg)  12/12/16 175 lb (79.4 kg)  10/31/16 172 lb (78 kg)    Physical Exam  Constitutional: She appears well-developed. No distress.  HENT:  Head: Normocephalic.  Right Ear: External ear normal.  Left Ear: External ear normal.  Nose: Nose normal.  Mouth/Throat: Oropharynx is clear and moist.  Eyes: Conjunctivae are normal. Pupils are equal, round, and reactive to light. Right eye exhibits no discharge. Left eye exhibits no discharge.  Neck: Normal range of motion. Neck supple. No JVD present. No tracheal deviation present. No thyromegaly present.  Cardiovascular: Normal rate, regular rhythm and normal heart sounds.  Pulmonary/Chest: No stridor. No respiratory distress. She has no wheezes.  Abdominal: Soft. Bowel sounds are normal. She exhibits no distension and no mass. There is no tenderness. There is no rebound and no guarding.  Musculoskeletal: She exhibits no edema or tenderness.  Lymphadenopathy:    She has no cervical adenopathy.  Neurological: She displays normal reflexes. No cranial nerve deficit. She exhibits normal muscle tone. Coordination normal.  Skin: No rash noted. No erythema.  Psychiatric: She has a normal mood and affect. Her behavior is normal. Judgment and thought content normal.  obese  Lab Results  Component Value Date   WBC 6.7 06/25/2015   HGB 13.5 06/25/2015   HCT 40.5 06/25/2015   PLT 287.0 06/25/2015   GLUCOSE 115 (H) 12/11/2016  CHOL 249 (H) 12/11/2016   TRIG 162.0 (H) 12/11/2016   HDL 52.90 12/11/2016   LDLCALC 164 (H) 12/11/2016   ALT 27 07/31/2016   AST 21 07/31/2016   NA 141 12/11/2016   K 4.6 12/11/2016   CL 105 12/11/2016   CREATININE 0.84 12/11/2016   BUN 12 12/11/2016   CO2 29 12/11/2016   TSH 2.30 04/24/2016   HGBA1C 6.3 12/11/2016    No results found.  Assessment & Plan:   There are no diagnoses linked to this encounter. I am  having Gary Denner maintain her atenolol, aspirin, butalbital-acetaminophen-caffeine, cholecalciferol, losartan, and Icosapent Ethyl.  No orders of the defined types were placed in this encounter.    Follow-up: No Follow-up on file.  Walker Kehr, MD

## 2017-04-17 NOTE — Assessment & Plan Note (Signed)
Alenolol,   Losartan - pt stopped

## 2017-04-17 NOTE — Assessment & Plan Note (Signed)
TSH 

## 2017-04-17 NOTE — Assessment & Plan Note (Addendum)
On Vit D 

## 2017-04-23 ENCOUNTER — Ambulatory Visit: Payer: Medicare HMO | Admitting: Neurology

## 2017-04-23 ENCOUNTER — Encounter: Payer: Self-pay | Admitting: Neurology

## 2017-04-23 VITALS — BP 146/80 | HR 80 | Ht 60.63 in | Wt 173.0 lb

## 2017-04-23 DIAGNOSIS — F0781 Postconcussional syndrome: Secondary | ICD-10-CM | POA: Diagnosis not present

## 2017-04-23 DIAGNOSIS — R69 Illness, unspecified: Secondary | ICD-10-CM | POA: Diagnosis not present
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
## 2017-04-23 NOTE — Patient Instructions (Signed)
Continue with taking breaks when needed to give your brain time to recover. Follow-up on as needed basis, call for any changes.

## 2017-05-19 DIAGNOSIS — Z1211 Encounter for screening for malignant neoplasm of colon: Secondary | ICD-10-CM | POA: Diagnosis not present

## 2017-05-19 DIAGNOSIS — Z1212 Encounter for screening for malignant neoplasm of rectum: Secondary | ICD-10-CM | POA: Diagnosis not present

## 2017-05-19 LAB — COLOGUARD: Cologuard: NEGATIVE

## 2017-05-22 ENCOUNTER — Other Ambulatory Visit (INDEPENDENT_AMBULATORY_CARE_PROVIDER_SITE_OTHER): Payer: Medicare HMO

## 2017-05-22 DIAGNOSIS — R7309 Other abnormal glucose: Secondary | ICD-10-CM

## 2017-05-22 DIAGNOSIS — R5382 Chronic fatigue, unspecified: Secondary | ICD-10-CM | POA: Diagnosis not present

## 2017-05-22 DIAGNOSIS — I10 Essential (primary) hypertension: Secondary | ICD-10-CM

## 2017-05-22 LAB — HEPATIC FUNCTION PANEL
ALK PHOS: 89 U/L (ref 39–117)
ALT: 19 U/L (ref 0–35)
AST: 16 U/L (ref 0–37)
Albumin: 4.2 g/dL (ref 3.5–5.2)
BILIRUBIN DIRECT: 0.1 mg/dL (ref 0.0–0.3)
TOTAL PROTEIN: 7.4 g/dL (ref 6.0–8.3)
Total Bilirubin: 0.5 mg/dL (ref 0.2–1.2)

## 2017-05-22 LAB — LIPID PANEL
Cholesterol: 239 mg/dL — ABNORMAL HIGH (ref 0–200)
HDL: 53.2 mg/dL (ref >39.00)
LDL Cholesterol: 160 mg/dL — ABNORMAL HIGH (ref 0–99)
NONHDL: 185.98
TRIGLYCERIDES: 131 mg/dL (ref 0.0–149.0)
Total CHOL/HDL Ratio: 4
VLDL: 26.2 mg/dL (ref 0.0–40.0)

## 2017-05-22 LAB — CBC WITH DIFFERENTIAL/PLATELET
BASOS ABS: 0 10*3/uL (ref 0.0–0.1)
BASOS PCT: 0.7 % (ref 0.0–3.0)
Eosinophils Absolute: 0.2 10*3/uL (ref 0.0–0.7)
Eosinophils Relative: 2.8 % (ref 0.0–5.0)
HCT: 39.2 % (ref 36.0–46.0)
Hemoglobin: 12.6 g/dL (ref 12.0–15.0)
LYMPHS ABS: 1.8 10*3/uL (ref 0.7–4.0)
Lymphocytes Relative: 26.3 % (ref 12.0–46.0)
MCHC: 32.1 g/dL (ref 30.0–36.0)
MCV: 87.4 fl (ref 78.0–100.0)
MONO ABS: 0.6 10*3/uL (ref 0.1–1.0)
Monocytes Relative: 9.3 % (ref 3.0–12.0)
NEUTROS ABS: 4.2 10*3/uL (ref 1.4–7.7)
NEUTROS PCT: 60.9 % (ref 43.0–77.0)
PLATELETS: 343 10*3/uL (ref 150.0–400.0)
RBC: 4.48 Mil/uL (ref 3.87–5.11)
RDW: 14 % (ref 11.5–15.5)
WBC: 6.9 10*3/uL (ref 4.0–10.5)

## 2017-05-22 LAB — URINALYSIS, ROUTINE W REFLEX MICROSCOPIC
Bilirubin Urine: NEGATIVE
KETONES UR: NEGATIVE
Nitrite: NEGATIVE
SPECIFIC GRAVITY, URINE: 1.01 (ref 1.000–1.030)
Total Protein, Urine: NEGATIVE
UROBILINOGEN UA: 0.2 (ref 0.0–1.0)
Urine Glucose: NEGATIVE
pH: 7.5 (ref 5.0–8.0)

## 2017-05-22 LAB — BASIC METABOLIC PANEL
BUN: 16 mg/dL (ref 6–23)
CALCIUM: 9.4 mg/dL (ref 8.4–10.5)
CHLORIDE: 102 meq/L (ref 96–112)
CO2: 28 meq/L (ref 19–32)
CREATININE: 0.79 mg/dL (ref 0.40–1.20)
GFR: 75.88 mL/min (ref >60.00)
Glucose, Bld: 128 mg/dL — ABNORMAL HIGH (ref 70–99)
Potassium: 4.3 mEq/L (ref 3.5–5.1)
Sodium: 139 mEq/L (ref 135–145)

## 2017-05-22 LAB — HEMOGLOBIN A1C: HEMOGLOBIN A1C: 6.4 % (ref 4.6–6.5)

## 2017-05-22 LAB — TSH: TSH: 2.45 u[IU]/mL (ref 0.35–4.50)

## 2017-05-22 LAB — SEDIMENTATION RATE: SED RATE: 32 mm/h — AB (ref 0–30)

## 2017-05-24 ENCOUNTER — Other Ambulatory Visit: Payer: Self-pay | Admitting: Internal Medicine

## 2017-05-24 MED ORDER — CEFUROXIME AXETIL 250 MG PO TABS: 250.0000 mg | ORAL_TABLET | Freq: Two times a day (BID) | ORAL | 0 refills | Status: DC

## 2017-06-02 ENCOUNTER — Ambulatory Visit: Admission: RE | Admit: 2017-06-02 | Payer: Medicare HMO | Source: Ambulatory Visit

## 2017-06-03 ENCOUNTER — Other Ambulatory Visit: Payer: Self-pay | Admitting: Internal Medicine

## 2017-06-03 DIAGNOSIS — N63 Unspecified lump in unspecified breast: Secondary | ICD-10-CM

## 2017-06-15 ENCOUNTER — Ambulatory Visit
Admission: RE | Admit: 2017-06-15 | Discharge: 2017-06-15 | Disposition: A | Discharge status: Home / Self Care | Payer: Medicare HMO | Source: Ambulatory Visit | Attending: Internal Medicine | Admitting: Internal Medicine

## 2017-06-15 ENCOUNTER — Other Ambulatory Visit: Payer: Self-pay | Admitting: Internal Medicine

## 2017-06-15 DIAGNOSIS — N63 Unspecified lump in unspecified breast: Secondary | ICD-10-CM

## 2017-06-15 DIAGNOSIS — R599 Enlarged lymph nodes, unspecified: Secondary | ICD-10-CM

## 2017-06-15 DIAGNOSIS — N6489 Other specified disorders of breast: Secondary | ICD-10-CM | POA: Diagnosis not present

## 2017-06-15 DIAGNOSIS — R928 Other abnormal and inconclusive findings on diagnostic imaging of breast: Secondary | ICD-10-CM | POA: Diagnosis not present

## 2017-06-15 DIAGNOSIS — N6320 Unspecified lump in the left breast, unspecified quadrant: Secondary | ICD-10-CM | POA: Diagnosis not present

## 2017-06-17 ENCOUNTER — Ambulatory Visit
Admission: RE | Admit: 2017-06-17 | Discharge: 2017-06-17 | Disposition: A | Discharge status: Home / Self Care | Payer: Medicare HMO | Source: Ambulatory Visit | Attending: Internal Medicine | Admitting: Internal Medicine

## 2017-06-17 ENCOUNTER — Other Ambulatory Visit: Payer: Self-pay | Admitting: Internal Medicine

## 2017-06-17 ENCOUNTER — Other Ambulatory Visit (HOSPITAL_COMMUNITY)
Admission: RE | Admit: 2017-06-17 | Discharge: 2017-06-17 | Disposition: A | Discharge status: Home / Self Care | Payer: Medicare HMO | Source: Ambulatory Visit | Attending: Internal Medicine | Admitting: Internal Medicine

## 2017-06-17 DIAGNOSIS — N6012 Diffuse cystic mastopathy of left breast: Secondary | ICD-10-CM | POA: Diagnosis not present

## 2017-06-17 DIAGNOSIS — N6320 Unspecified lump in the left breast, unspecified quadrant: Secondary | ICD-10-CM | POA: Diagnosis present

## 2017-06-17 DIAGNOSIS — N63 Unspecified lump in unspecified breast: Secondary | ICD-10-CM

## 2017-06-17 DIAGNOSIS — R599 Enlarged lymph nodes, unspecified: Secondary | ICD-10-CM

## 2017-06-17 DIAGNOSIS — N6323 Unspecified lump in the left breast, lower outer quadrant: Secondary | ICD-10-CM | POA: Diagnosis not present

## 2017-06-17 DIAGNOSIS — R590 Localized enlarged lymph nodes: Secondary | ICD-10-CM | POA: Diagnosis not present

## 2017-06-17 DIAGNOSIS — R896 Abnormal cytological findings in specimens from other organs, systems and tissues: Secondary | ICD-10-CM | POA: Diagnosis not present

## 2017-06-25 DIAGNOSIS — I10 Essential (primary) hypertension: Secondary | ICD-10-CM | POA: Diagnosis not present

## 2017-06-25 DIAGNOSIS — R69 Illness, unspecified: Secondary | ICD-10-CM | POA: Diagnosis not present

## 2017-06-25 DIAGNOSIS — Z803 Family history of malignant neoplasm of breast: Secondary | ICD-10-CM | POA: Diagnosis not present

## 2017-06-25 DIAGNOSIS — R011 Cardiac murmur, unspecified: Secondary | ICD-10-CM | POA: Diagnosis not present

## 2017-06-25 DIAGNOSIS — N6320 Unspecified lump in the left breast, unspecified quadrant: Secondary | ICD-10-CM | POA: Diagnosis not present

## 2017-07-01 ENCOUNTER — Encounter: Payer: Self-pay | Admitting: Internal Medicine

## 2017-07-01 ENCOUNTER — Ambulatory Visit (INDEPENDENT_AMBULATORY_CARE_PROVIDER_SITE_OTHER): Payer: Medicare HMO | Admitting: Internal Medicine

## 2017-07-01 DIAGNOSIS — F0781 Postconcussional syndrome: Secondary | ICD-10-CM

## 2017-07-01 DIAGNOSIS — N63 Unspecified lump in unspecified breast: Secondary | ICD-10-CM

## 2017-07-01 DIAGNOSIS — E559 Vitamin D deficiency, unspecified: Secondary | ICD-10-CM

## 2017-07-01 DIAGNOSIS — I10 Essential (primary) hypertension: Secondary | ICD-10-CM | POA: Diagnosis not present

## 2017-07-01 DIAGNOSIS — R413 Other amnesia: Secondary | ICD-10-CM | POA: Diagnosis not present

## 2017-07-01 DIAGNOSIS — R69 Illness, unspecified: Secondary | ICD-10-CM | POA: Diagnosis not present

## 2017-07-01 MED ORDER — ATENOLOL 50 MG PO TABS: 50.0000 mg | ORAL_TABLET | Freq: Two times a day (BID) | ORAL | 3 refills | Status: DC

## 2017-07-01 NOTE — Assessment & Plan Note (Addendum)
Fioricet prn  Potential benefits of a long term Fioricet  use as well as potential risks  and complications were explained to the patient and were aknowledged. The patient may have reached maximum possible improvement of her postconcussion syndrome at this point.  She continues to suffer with headaches and fatigue.  She is able to continue to work from home only (unable to work from the office) no more than 4 hours a day.  She has to take frequent breaks of 60-90 minutes following work periods of 45 minutes.

## 2017-07-01 NOTE — Assessment & Plan Note (Signed)
Losartan - pt stopped

## 2017-07-01 NOTE — Progress Notes (Signed)
Subjective:  Patient ID: Robin Estrada, female    DOB: 07/07/1945  Age: 72 y.o. MRN: [id number]  CC: No chief complaint on file.   HPI [name] presents for L breast mass  F/u HTN, post-concussion   Outpatient Medications Prior to Visit  Medication Sig Dispense Refill  . aspirin 81 MG tablet Take 81 mg by mouth daily.    Marland Kitchen atenolol (TENORMIN) 50 MG tablet Take 50 mg by mouth 2 (two) times daily.     . butalbital-acetaminophen-caffeine (FIORICET) 50-325-40 MG tablet Take 1-2 tablets by mouth 2 (two) times daily as needed for headache. 60 tablet 1  . cefUROXime (CEFTIN) 250 MG tablet Take 1 tablet (250 mg total) by mouth 2 (two) times daily. 8 tablet 0  . cholecalciferol (VITAMIN D) 1000 units tablet Take 1 tablet (1,000 Units total) by mouth daily. 100 tablet 3  . Icosapent Ethyl (VASCEPA) 1 g CAPS Take 2 capsules by mouth 2 (two) times daily. 120 capsule 11   No facility-administered medications prior to visit.     ROS Review of Systems  Constitutional: Positive for fatigue. Negative for activity change, appetite change, chills and unexpected weight change.  HENT: Negative for congestion, mouth sores and sinus pressure.   Eyes: Negative for visual disturbance.  Respiratory: Negative for cough and chest tightness.   Gastrointestinal: Negative for abdominal pain and nausea.  Genitourinary: Negative for difficulty urinating, frequency and vaginal pain.  Musculoskeletal: Negative for back pain and gait problem.  Skin: Negative for pallor and rash.  Neurological: Positive for light-headedness and headaches. Negative for dizziness, tremors, weakness and numbness.  Psychiatric/Behavioral: Positive for confusion and decreased concentration. Negative for sleep disturbance and suicidal ideas. The patient is nervous/anxious.     Objective:  BP (!) 152/88 (BP Location: Left Arm, Patient Position: Sitting, Cuff Size: Normal)   Pulse 76   Temp 98 F (36.7 C) (Oral)   Ht 5' 0.63"  (1.54 m)   Wt 170 lb (77.1 kg)   SpO2 99%   BMI 32.51 kg/m   BP Readings from Last 3 Encounters:  07/01/17 (!) 152/88  04/23/17 (!) 146/80  04/17/17 136/84    Wt Readings from Last 3 Encounters:  07/01/17 170 lb (77.1 kg)  04/23/17 173 lb (78.5 kg)  04/17/17 172 lb (78 kg)    Physical Exam  Constitutional: She appears well-developed. No distress.  HENT:  Head: Normocephalic.  Right Ear: External ear normal.  Left Ear: External ear normal.  Nose: Nose normal.  Mouth/Throat: Oropharynx is clear and moist.  Eyes: Conjunctivae are normal. Pupils are equal, round, and reactive to light. Right eye exhibits no discharge. Left eye exhibits no discharge.  Neck: Normal range of motion. Neck supple. No JVD present. No tracheal deviation present. No thyromegaly present.  Cardiovascular: Normal rate, regular rhythm and normal heart sounds.  Pulmonary/Chest: No stridor. No respiratory distress. She has no wheezes.  Abdominal: Soft. Bowel sounds are normal. She exhibits no distension and no mass. There is no tenderness. There is no rebound and no guarding.  Musculoskeletal: She exhibits tenderness. She exhibits no edema.  Lymphadenopathy:    She has no cervical adenopathy.  Neurological: She displays normal reflexes. No cranial nerve deficit. She exhibits normal muscle tone. Coordination abnormal.  Skin: No rash noted. No erythema.  Psychiatric: She has a normal mood and affect. Her behavior is normal. Judgment and thought content normal.  breasts NE  Lab Results  Component Value Date   WBC 6.9 05/22/2017  HGB 12.6 05/22/2017   HCT 39.2 05/22/2017   PLT 343.0 05/22/2017   GLUCOSE 128 (H) 05/22/2017   CHOL 239 (H) 05/22/2017   TRIG 131.0 05/22/2017   HDL 53.20 05/22/2017   LDLCALC 160 (H) 05/22/2017   ALT 19 05/22/2017   AST 16 05/22/2017   NA 139 05/22/2017   K 4.3 05/22/2017   CL 102 05/22/2017   CREATININE 0.79 05/22/2017   BUN 16 05/22/2017   CO2 28 05/22/2017   TSH  2.45 05/22/2017   HGBA1C 6.4 05/22/2017    [place] Axillary Node Core Biopsy Left  Addendum Date: 06/23/2017   ADDENDUM REPORT: 06/19/2017 14:52 ADDENDUM: Pathology revealed INFLAMED CYST WITH ATYPICAL DUCTAL HYPERPLASIA, FIBROTIC AND FOCALLY ATYPICAL STROMA of the Left breast, 3:00 o'clock. The differential diagnosis is broad and includes an atypical ductal proliferation, a fibroepithelial tumor such as phyllodes tumor and a reactive, atypical fibroepithelial stromal response. Excision of this lesion is warranted to better characterize the process. This was found to be concordant by Dr. Franki Cabot, with excision recommended. Pathology revealed BENIGN LYMPH NODE of the Left axilla. CYTOPATHOLOGY revealed ATYPICAL EPITHELIAL CELLS, FINDINGS CONSISTENT WITH THE CONTENTS OF A CYST of the Left breast fine needle aspiration. This was found to be concordant by Dr. Franki Cabot. Pathology results were discussed with the patient's son Nimo Verastegui by telephone, per patient request. Mr. Wetherell reported his mother did well after the biopsies with tenderness at the sites. Post biopsy instructions and care were reviewed and questions were answered. The patient was encouraged to call [facility] for any additional concerns. Surgical consultation has been arranged with Dr. Fanny Skates at [facility] on June 25, 2017. Pathology results reported by Terie Purser, RN on 06/19/2017. Electronically Signed   By: Franki Cabot M.D.   On: 06/19/2017 14:52   Result Date: 06/23/2017 CLINICAL DATA:  Patient with a complex cystic and solid mass within the left breast presents today for ultrasound-guided core biopsy of the solid component and ultrasound-guided cyst aspiration for the fluid components. EXAM: ULTRASOUND GUIDED LEFT BREAST CORE NEEDLE BIOPSY ULTRASOUND-GUIDED LEFT AXILLA CORE NEEDLE BIOPSY ULTRASOUND-GUIDED LEFT BREAST CYST ASPIRATION COMPARISON:  Previous exam(s). PROCEDURE: I  met with the patient and we discussed the procedure of ultrasound-guided biopsy, including benefits and alternatives. We discussed the high likelihood of a successful procedure. We discussed the risks of the procedure including infection, bleeding, tissue injury, clip migration, and inadequate sampling. Informed written consent was given. The usual time-out protocol was performed immediately prior to the procedure. Lesion quadrant: Lower outer quadrant Site 1: Using sterile technique and 1% Lidocaine as local anesthetic, under direct ultrasound visualization, a 12 gauge spring-loaded device was used to perform biopsy of the solid component of the complex cystic and solid mass within the left breast at the 3:30 o'clock axisusing a lateral approach. At the conclusion of the procedure, a ribbon shaped tissue marker clip was deployed into the biopsy cavity. Site 2: Next, using sterile technique and 1% lidocaine as local anesthetic, under direct ultrasound visualization, a 14 gauge spring-loaded device was used to perform biopsy of the morphologically abnormal lymph node in the left axilla using a lateral approach. At the conclusion of the procedure, a spiral shaped tissue marker clip was deployed in the biopsy cavity. Site 3: Next, using sterile technique and 1% lidocaine as local anesthetic, under direct ultrasound visualization, an 18 gauge needle was advanced into the cystic components of the complex cystic and solid left  breast mass at the 4 o'clock axis. Approximately 350 cc of brownish fluid was aspirated. A portion was sent to the laboratory for cytologic evaluation. Follow-up 2-view mammogram was performed and dictated separately. IMPRESSION: 1. Ultrasound-guided biopsy of the left breast mass (solid component) at the 3:30 o'clock axis. Ribbon shaped clip placed at the biopsy site. 2. Ultrasound-guided biopsy of the morphologically abnormal lymph node in the left axilla. Spiral shaped clip placed at the biopsy  site. 3. Ultrasound-guided aspiration of the left breast mass (cystic components) at the 4 o'clock axis. 350 cc of brownish fluid aspirated. Portion was sent to the laboratory. Electronically Signed: By: Franki Cabot M.D. On: 06/17/2017 16:38   Mm Clip Placement Left  Result Date: 06/17/2017 CLINICAL DATA:  Status post ultrasound-guided core biopsy today of the solid component of a mixed complex cystic and solid mass in the left breast. Subsequent aspiration was performed of the cystic components of the mass. EXAM: DIAGNOSTIC LEFT MAMMOGRAM POST ULTRASOUND BIOPSY COMPARISON:  Previous exam(s). FINDINGS: Mammographic images were obtained following ultrasound guided biopsy of the left breast mass at the 3 o'clock axis. Ribbon shaped clip is well-positioned within the mass. Mass is significantly decreased in size status post aspiration of the cystic component (approximately 350 cc of brownish fluid removed). IMPRESSION: Ribbon shaped biopsy clip is well-positioned within the left breast mass. Final Assessment: Post Procedure Mammograms for Marker Placement Electronically Signed   By: Franki Cabot M.D.   On: 06/17/2017 16:44   [place] Lt Breast Bx W Loc Dev 1st Lesion Img Bx Spec US Guide  Addendum Date: 06/23/2017   ADDENDUM REPORT: 06/19/2017 14:52 ADDENDUM: Pathology revealed INFLAMED CYST WITH ATYPICAL DUCTAL HYPERPLASIA, FIBROTIC AND FOCALLY ATYPICAL STROMA of the Left breast, 3:00 o'clock. The differential diagnosis is broad and includes an atypical ductal proliferation, a fibroepithelial tumor such as phyllodes tumor and a reactive, atypical fibroepithelial stromal response. Excision of this lesion is warranted to better characterize the process. This was found to be concordant by Dr. Franki Cabot, with excision recommended. Pathology revealed BENIGN LYMPH NODE of the Left axilla. CYTOPATHOLOGY revealed ATYPICAL EPITHELIAL CELLS, FINDINGS CONSISTENT WITH THE CONTENTS OF A CYST of the Left breast fine needle  aspiration. This was found to be concordant by Dr. Franki Cabot. Pathology results were discussed with the patient's son Jarah Pember by telephone, per patient request. Mr. Asano reported his mother did well after the biopsies with tenderness at the sites. Post biopsy instructions and care were reviewed and questions were answered. The patient was encouraged to call [facility] [place] for any additional concerns. Surgical consultation has been arranged with Dr. Fanny Skates at [facility] on June 25, 2017. Pathology results reported by Terie Purser, RN on 06/19/2017. Electronically Signed   By: Franki Cabot M.D.   On: 06/19/2017 14:52   Result Date: 06/23/2017 CLINICAL DATA:  Patient with a complex cystic and solid mass within the left breast presents today for ultrasound-guided core biopsy of the solid component and ultrasound-guided cyst aspiration for the fluid components. EXAM: ULTRASOUND GUIDED LEFT BREAST CORE NEEDLE BIOPSY ULTRASOUND-GUIDED LEFT AXILLA CORE NEEDLE BIOPSY ULTRASOUND-GUIDED LEFT BREAST CYST ASPIRATION COMPARISON:  Previous exam(s). PROCEDURE: I met with the patient and we discussed the procedure of ultrasound-guided biopsy, including benefits and alternatives. We discussed the high likelihood of a successful procedure. We discussed the risks of the procedure including infection, bleeding, tissue injury, clip migration, and inadequate sampling. Informed written consent was given. The usual time-out protocol  was performed immediately prior to the procedure. Lesion quadrant: Lower outer quadrant Site 1: Using sterile technique and 1% Lidocaine as local anesthetic, under direct ultrasound visualization, a 12 gauge spring-loaded device was used to perform biopsy of the solid component of the complex cystic and solid mass within the left breast at the 3:30 o'clock axisusing a lateral approach. At the conclusion of the procedure, a ribbon shaped tissue marker  clip was deployed into the biopsy cavity. Site 2: Next, using sterile technique and 1% lidocaine as local anesthetic, under direct ultrasound visualization, a 14 gauge spring-loaded device was used to perform biopsy of the morphologically abnormal lymph node in the left axilla using a lateral approach. At the conclusion of the procedure, a spiral shaped tissue marker clip was deployed in the biopsy cavity. Site 3: Next, using sterile technique and 1% lidocaine as local anesthetic, under direct ultrasound visualization, an 18 gauge needle was advanced into the cystic components of the complex cystic and solid left breast mass at the 4 o'clock axis. Approximately 350 cc of brownish fluid was aspirated. A portion was sent to the laboratory for cytologic evaluation. Follow-up 2-view mammogram was performed and dictated separately. IMPRESSION: 1. Ultrasound-guided biopsy of the left breast mass (solid component) at the 3:30 o'clock axis. Ribbon shaped clip placed at the biopsy site. 2. Ultrasound-guided biopsy of the morphologically abnormal lymph node in the left axilla. Spiral shaped clip placed at the biopsy site. 3. Ultrasound-guided aspiration of the left breast mass (cystic components) at the 4 o'clock axis. 350 cc of brownish fluid aspirated. Portion was sent to the laboratory. Electronically Signed: By: Franki Cabot M.D. On: 06/17/2017 16:38   US Breast Aspiration Left  Addendum Date: 06/23/2017   ADDENDUM REPORT: 06/19/2017 14:52 ADDENDUM: Pathology revealed INFLAMED CYST WITH ATYPICAL DUCTAL HYPERPLASIA, FIBROTIC AND FOCALLY ATYPICAL STROMA of the Left breast, 3:00 o'clock. The differential diagnosis is broad and includes an atypical ductal proliferation, a fibroepithelial tumor such as phyllodes tumor and a reactive, atypical fibroepithelial stromal response. Excision of this lesion is warranted to better characterize the process. This was found to be concordant by Dr. Franki Cabot, with excision  recommended. Pathology revealed BENIGN LYMPH NODE of the Left axilla. CYTOPATHOLOGY revealed ATYPICAL EPITHELIAL CELLS, FINDINGS CONSISTENT WITH THE CONTENTS OF A CYST of the Left breast fine needle aspiration. This was found to be concordant by Dr. Franki Cabot. Pathology results were discussed with the patient's son Florence Yeung by telephone, per patient request. Mr. Raker reported his mother did well after the biopsies with tenderness at the sites. Post biopsy instructions and care were reviewed and questions were answered. The patient was encouraged to call [facility] for any additional concerns. Surgical consultation has been arranged with Dr. Fanny Skates at [facility] on June 25, 2017. Pathology results reported by Terie Purser, RN on 06/19/2017. Electronically Signed   By: Franki Cabot M.D.   On: 06/19/2017 14:52   Result Date: 06/23/2017 CLINICAL DATA:  Patient with a complex cystic and solid mass within the left breast presents today for ultrasound-guided core biopsy of the solid component and ultrasound-guided cyst aspiration for the fluid components. EXAM: ULTRASOUND GUIDED LEFT BREAST CORE NEEDLE BIOPSY ULTRASOUND-GUIDED LEFT AXILLA CORE NEEDLE BIOPSY ULTRASOUND-GUIDED LEFT BREAST CYST ASPIRATION COMPARISON:  Previous exam(s). PROCEDURE: I met with the patient and we discussed the procedure of ultrasound-guided biopsy, including benefits and alternatives. We discussed the high likelihood of a successful procedure. We discussed the risks of  the procedure including infection, bleeding, tissue injury, clip migration, and inadequate sampling. Informed written consent was given. The usual time-out protocol was performed immediately prior to the procedure. Lesion quadrant: Lower outer quadrant Site 1: Using sterile technique and 1% Lidocaine as local anesthetic, under direct ultrasound visualization, a 12 gauge spring-loaded device was used to perform biopsy  of the solid component of the complex cystic and solid mass within the left breast at the 3:30 o'clock axisusing a lateral approach. At the conclusion of the procedure, a ribbon shaped tissue marker clip was deployed into the biopsy cavity. Site 2: Next, using sterile technique and 1% lidocaine as local anesthetic, under direct ultrasound visualization, a 14 gauge spring-loaded device was used to perform biopsy of the morphologically abnormal lymph node in the left axilla using a lateral approach. At the conclusion of the procedure, a spiral shaped tissue marker clip was deployed in the biopsy cavity. Site 3: Next, using sterile technique and 1% lidocaine as local anesthetic, under direct ultrasound visualization, an 18 gauge needle was advanced into the cystic components of the complex cystic and solid left breast mass at the 4 o'clock axis. Approximately 350 cc of brownish fluid was aspirated. A portion was sent to the laboratory for cytologic evaluation. Follow-up 2-view mammogram was performed and dictated separately. IMPRESSION: 1. Ultrasound-guided biopsy of the left breast mass (solid component) at the 3:30 o'clock axis. Ribbon shaped clip placed at the biopsy site. 2. Ultrasound-guided biopsy of the morphologically abnormal lymph node in the left axilla. Spiral shaped clip placed at the biopsy site. 3. Ultrasound-guided aspiration of the left breast mass (cystic components) at the 4 o'clock axis. 350 cc of brownish fluid aspirated. Portion was sent to the laboratory. Electronically Signed: By: Franki Cabot M.D. On: 06/17/2017 16:38    Assessment & Plan:   There are no diagnoses linked to this encounter. I am having Nishtha Kohli maintain her atenolol, aspirin, butalbital-acetaminophen-caffeine, cholecalciferol, Icosapent Ethyl, and cefUROXime.  No orders of the defined types were placed in this encounter.    Follow-up: No Follow-up on file.  Walker Kehr, MD

## 2017-07-01 NOTE — Assessment & Plan Note (Signed)
Vit D 

## 2017-07-01 NOTE — Assessment & Plan Note (Signed)
S/p (-) core bx Open biopsy is planned

## 2017-07-01 NOTE — Assessment & Plan Note (Signed)
Unable to work

## 2017-07-02 ENCOUNTER — Telehealth: Payer: Self-pay

## 2017-07-02 ENCOUNTER — Telehealth: Payer: Self-pay | Admitting: Neurology

## 2017-07-02 NOTE — Telephone Encounter (Signed)
Letter written and placed up front for pt/son pick up

## 2017-07-02 NOTE — Telephone Encounter (Signed)
Patient's son called and requesting a letter stating that his mother is unable to work in 45 min time frames, which was stated at the last visit. Also, that she is unlikely to ever recover. He said this is for Insurance reasons. Please call. Thanks

## 2017-07-02 NOTE — Telephone Encounter (Signed)
Do you know anything about these referrals?  Copied from [place] [phone]. Topic: Referral - Request >> Jun 25, 2017  2:45 PM Bennye Alm wrote: Reason for CRM:  Patient's son called because they need to be referred for Cardioloy, Endo, Gyn as told by Dr. Alain Marion.

## 2017-07-05 NOTE — Telephone Encounter (Signed)
No. All she needs is to schedule breast surgery and have a pre-op EKG/labs/CXR per routine pre-op check-up with anesthesia.  Thx

## 2017-07-09 ENCOUNTER — Encounter: Payer: Self-pay | Admitting: Internal Medicine

## 2017-07-09 NOTE — Telephone Encounter (Signed)
pts son notified

## 2017-07-09 NOTE — Progress Notes (Signed)
Outside notes received. Information abstracted. Notes sent to scan.  

## 2017-07-11 ENCOUNTER — Other Ambulatory Visit: Payer: Self-pay | Admitting: General Surgery

## 2017-07-14 ENCOUNTER — Other Ambulatory Visit: Payer: Self-pay | Admitting: Internal Medicine

## 2017-07-14 DIAGNOSIS — Z01810 Encounter for preprocedural cardiovascular examination: Secondary | ICD-10-CM

## 2017-07-17 ENCOUNTER — Encounter: Payer: Self-pay | Admitting: Cardiovascular Disease

## 2017-07-20 NOTE — Progress Notes (Addendum)
Cardiology Office Note   Date:  07/21/2017   ID:  Robin Estrada July 25, 1945, MRN [id number]  PCP:  [name], MD  Cardiologist:   Jenkins Rouge, MD   No chief complaint on file.     History of Present Illness: Robin Estrada is a 72 y.o. female who presents for preoperative clearance Referred by Dr Robin Estrada.  She has left breast mass. And will need surgery . History of HTN  And HLD Post core biopsy On 06/23/17 Recent concussion with fatigue and headaches Rx with Fioricet Son did most of interpreting. 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 She has no history of CAD, chest pain, palpitations or syncope. She describes a history of rheumatic fever and being ill as a child ? Murmur and ? MVP. She has exertional dyspnea. Non smoker no chronic lung disease. Fairly sedentary but easily does more than 4 METS. No previous surgeries or general anesthesia. No previous bleeding dyscrasia. On atenolol for BP and takes a baby aspirin daily   Reviewed echo done today after our visit. EF 55-60% trace AR no significant valve disease or evidence or rheumatic valve disease   Past Medical History:  Diagnosis Date  . Hypertension   . Mitral valve prolapse     Past Surgical History:  Procedure Laterality Date  . NO PAST SURGERIES       Current Outpatient Medications  Medication Sig Dispense Refill  . aspirin 81 MG tablet Take 81 mg by mouth daily.    Marland Kitchen atenolol (TENORMIN) 50 MG tablet Take 1 tablet (50 mg total) by mouth 2 (two) times daily. 180 tablet 3  . butalbital-acetaminophen-caffeine (FIORICET) 50-325-40 MG tablet Take 1-2 tablets by mouth 2 (two) times daily as needed for headache. 60 tablet 1  . cholecalciferol (VITAMIN D) 1000 units tablet Take 1 tablet (1,000 Units total) by mouth daily. 100 tablet 3  . Icosapent Ethyl (VASCEPA) 1 g CAPS Take 2 capsules by mouth 2 (two) times daily. (Patient not taking: Reported on 07/21/2017) 120 capsule 11   No  current facility-administered medications for this visit.     Allergies:   Statins    Social History:  The patient  reports that  has never smoked. she has never used smokeless tobacco. She reports that she does not drink alcohol or use drugs.   Family History:  The patient's family history includes Cancer (age of onset: 38) in her mother; Stroke in her maternal grandmother and mother.    ROS:  Please see the history of present illness.   Otherwise, review of systems are positive for none.   All other systems are reviewed and negative.    PHYSICAL EXAM: VS:  BP 138/80   Pulse 74   Wt 169 lb 1.9 oz (76.7 kg)   BMI 32.35 kg/m  , BMI Body mass index is 32.35 kg/m. Affect appropriate Healthy:  appears stated age 73: normal Neck supple with no adenopathy JVP normal no bruits no thyromegaly Lungs clear with no wheezing and good diaphragmatic motion Heart:  S1/S2 no murmur, no rub, gallop or click PMI normal Abdomen: benighn, BS positve, no tenderness, no AAA no bruit.  No HSM or HJR Distal pulses intact with no bruits No edema Neuro non-focal Skin warm and dry No muscular weakness    EKG: SR rate 58 normal 06/02/15 07/21/17 NSR rate 74 normal    Recent Labs: 05/22/2017: ALT 19; BUN 16; Creatinine, Ser 0.79; Hemoglobin 12.6; Platelets  343.0; Potassium 4.3; Sodium 139; TSH 2.45    Lipid Panel    Component Value Date/Time   CHOL 239 (H) 05/22/2017 0826   TRIG 131.0 05/22/2017 0826   HDL 53.20 05/22/2017 0826   CHOLHDL 4 05/22/2017 0826   VLDL 26.2 05/22/2017 0826   LDLCALC 160 (H) 05/22/2017 0826      Wt Readings from Last 3 Encounters:  07/21/17 169 lb 1.9 oz (76.7 kg)  07/01/17 170 lb (77.1 kg)  04/23/17 173 lb (78.5 kg)      Other studies Reviewed: Additional studies/ records that were reviewed today include: Notes from Dr Robin Estrada, labs, [facility] radiology notes regarding biopsy .    ASSESSMENT AND PLAN:  1.  Preoperative Assessment no need for  ETT no chest pain, ECG normal and low risk surgery  She has history of murmur, Rheumatic fever and dyspnea on exertion However echo reviewed today Shows normal EF and no significant valve disease Clear to have surgery with Dr Robin Estrada under general anesthesia 2. HTN Well controlled.  Continue current medications and low sodium Dash type diet.   3. HLD /fu primary allergy to statin consider zetia would not qualify for PSK9 with no documented vascular dx 4. Post Concussive Syndrome improved  5. Breast Mass f/u Robin Estrada will clear for surgery if echo is ok    Current medicines are reviewed at length with the patient today.  The patient does not have concerns regarding medicines.  The following changes have been made:  no change  Labs/ tests ordered today include: Echo / TTE   Orders Placed This Encounter  Procedures  . EKG 12-Lead  . ECHOCARDIOGRAM COMPLETE     Disposition:   FU with cardiology PRN      Signed, Jenkins Rouge, MD  07/21/2017 3:39 PM    [address], [place], [place]  [address] Phone: [phone]; Fax: [phone]

## 2017-07-21 ENCOUNTER — Ambulatory Visit (HOSPITAL_COMMUNITY): Payer: Medicare HMO | Attending: Cardiovascular Disease

## 2017-07-21 ENCOUNTER — Ambulatory Visit: Payer: Medicare HMO | Admitting: Cardiovascular Disease

## 2017-07-21 ENCOUNTER — Other Ambulatory Visit: Payer: Self-pay

## 2017-07-21 ENCOUNTER — Encounter: Payer: Self-pay | Admitting: Cardiovascular Disease

## 2017-07-21 VITALS — BP 138/80 | HR 74 | Wt 169.1 lb

## 2017-07-21 DIAGNOSIS — Z01818 Encounter for other preprocedural examination: Secondary | ICD-10-CM

## 2017-07-21 DIAGNOSIS — I10 Essential (primary) hypertension: Secondary | ICD-10-CM

## 2017-07-21 DIAGNOSIS — E785 Hyperlipidemia, unspecified: Secondary | ICD-10-CM | POA: Diagnosis not present

## 2017-07-21 LAB — ECHOCARDIOGRAM COMPLETE: WEIGHTICAEL: 2705.92 [oz_av]

## 2017-07-21 NOTE — Patient Instructions (Addendum)
Medication Instructions:  Your physician recommends that you continue on your current medications as directed. Please refer to the Current Medication list given to you today.  Labwork: NONE  Testing/Procedures: Your physician has requested that you have an echocardiogram on Thursday. Echocardiography is a painless test that uses sound waves to create images of your heart. It provides your doctor with information about the size and shape of your heart and how well your heart's chambers and valves are working. This procedure takes approximately one hour. There are no restrictions for this procedure.  Follow-Up: Your physician wants you to follow-up as needed with Dr. Johnsie Cancel.   If you need a refill on your cardiac medications before your next appointment, please call your pharmacy.

## 2017-08-10 NOTE — Pre-Procedure Instructions (Signed)
Robin Estrada  08/10/2017      [facility] [address], [place] [address] Phone: [phone] Fax: [phone]    Your procedure is scheduled on Friday April 5.  Report to [facility] Admitting at 8:00 A.M.  Call this number if you have problems the morning of surgery:  [phone]   Remember:  Do not eat food or drink liquids after midnight.  **DRINK Ensure Pre-surgery Drink prior to leaving home the morning of surgery**   Take these medicines the morning of surgery with A SIP OF WATER: Atenolol (Tenormin)  7 days prior to surgery STOP taking any Aspirin(unless otherwise instructed by your surgeon), Aleve, Naproxen, Ibuprofen, Motrin, Advil, Goody's, BC's, all herbal medications, fish oil, and all vitamins    Do not wear jewelry, make-up or nail polish.  Do not wear lotions, powders, or perfumes, or deodorant.  Do not shave 48 hours prior to surgery.  Men may shave face and neck.  Do not bring valuables to the hospital.  [facility] is not responsible for any belongings or valuables.  Contacts, dentures or bridgework may not be worn into surgery.  Leave your suitcase in the car.  After surgery it may be brought to your room.  For patients admitted to the hospital, discharge time will be determined by your treatment team.  Patients discharged the day of surgery will not be allowed to drive home.   Special instructions:    [facility]- Preparing For Surgery  Before surgery, you can play an important role. Because skin is not sterile, your skin needs to be as free of germs as possible. You can reduce the number of germs on your skin by washing with CHG (chlorahexidine gluconate) Soap before surgery.  CHG is an antiseptic cleaner which kills germs and bonds with the skin to continue killing germs even after washing.  Please do not use if you have an  allergy to CHG or antibacterial soaps. If your skin becomes reddened/irritated stop using the CHG.  Do not shave (including legs and underarms) for at least 48 hours prior to first CHG shower. It is OK to shave your face.  Please follow these instructions carefully.   1. Shower the NIGHT BEFORE SURGERY and the MORNING OF SURGERY with CHG.   2. If you chose to wash your hair, wash your hair first as usual with your normal shampoo.  3. After you shampoo, rinse your hair and body thoroughly to remove the shampoo.  4. Use CHG as you would any other liquid soap. You can apply CHG directly to the skin and wash gently with a scrungie or a clean washcloth.   5. Apply the CHG Soap to your body ONLY FROM THE NECK DOWN.  Do not use on open wounds or open sores. Avoid contact with your eyes, ears, mouth and genitals (private parts). Wash Face and genitals (private parts)  with your normal soap.  6. Wash thoroughly, paying special attention to the area where your surgery will be performed.  7. Thoroughly rinse your body with warm water from the neck down.  8. DO NOT shower/wash with your normal soap after using and rinsing off the CHG Soap.  9. Pat yourself dry with a CLEAN TOWEL.  10. Wear CLEAN PAJAMAS to bed the night before surgery, wear comfortable clothes the morning of surgery  11. Place CLEAN  SHEETS on your bed the night of your first shower and DO NOT SLEEP WITH PETS.    Day of Surgery: Do not apply any deodorants/lotions. Please wear clean clothes to the hospital/surgery center.      Please read over the following fact sheets that you were given. Coughing and Deep Breathing and Surgical Site Infection Prevention
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
## 2017-08-11 ENCOUNTER — Encounter (HOSPITAL_COMMUNITY)
Admission: RE | Admit: 2017-08-11 | Discharge: 2017-08-11 | Disposition: A | Discharge status: Home / Self Care | Payer: Medicare HMO | Source: Ambulatory Visit | Attending: General Surgery | Admitting: General Surgery

## 2017-08-11 ENCOUNTER — Encounter (HOSPITAL_COMMUNITY): Payer: Self-pay

## 2017-08-11 ENCOUNTER — Other Ambulatory Visit: Payer: Self-pay

## 2017-08-11 DIAGNOSIS — I10 Essential (primary) hypertension: Secondary | ICD-10-CM | POA: Diagnosis not present

## 2017-08-11 DIAGNOSIS — Z803 Family history of malignant neoplasm of breast: Secondary | ICD-10-CM | POA: Diagnosis not present

## 2017-08-11 DIAGNOSIS — E7800 Pure hypercholesterolemia, unspecified: Secondary | ICD-10-CM | POA: Diagnosis not present

## 2017-08-11 DIAGNOSIS — E785 Hyperlipidemia, unspecified: Secondary | ICD-10-CM | POA: Diagnosis not present

## 2017-08-11 DIAGNOSIS — R011 Cardiac murmur, unspecified: Secondary | ICD-10-CM | POA: Diagnosis not present

## 2017-08-11 DIAGNOSIS — F0390 Unspecified dementia without behavioral disturbance: Secondary | ICD-10-CM | POA: Diagnosis not present

## 2017-08-11 DIAGNOSIS — Z79899 Other long term (current) drug therapy: Secondary | ICD-10-CM | POA: Diagnosis not present

## 2017-08-11 DIAGNOSIS — E669 Obesity, unspecified: Secondary | ICD-10-CM | POA: Diagnosis not present

## 2017-08-11 DIAGNOSIS — R51 Headache: Secondary | ICD-10-CM | POA: Diagnosis not present

## 2017-08-11 DIAGNOSIS — Z6831 Body mass index (BMI) 31.0-31.9, adult: Secondary | ICD-10-CM | POA: Diagnosis not present

## 2017-08-11 DIAGNOSIS — Z8249 Family history of ischemic heart disease and other diseases of the circulatory system: Secondary | ICD-10-CM | POA: Diagnosis not present

## 2017-08-11 DIAGNOSIS — Z833 Family history of diabetes mellitus: Secondary | ICD-10-CM | POA: Diagnosis not present

## 2017-08-11 DIAGNOSIS — Z823 Family history of stroke: Secondary | ICD-10-CM | POA: Diagnosis not present

## 2017-08-11 DIAGNOSIS — D0512 Intraductal carcinoma in situ of left breast: Secondary | ICD-10-CM | POA: Diagnosis not present

## 2017-08-11 DIAGNOSIS — R0600 Dyspnea, unspecified: Secondary | ICD-10-CM | POA: Diagnosis not present

## 2017-08-11 DIAGNOSIS — N63 Unspecified lump in unspecified breast: Secondary | ICD-10-CM | POA: Diagnosis present

## 2017-08-11 HISTORY — DX: Headache, unspecified: R51.9

## 2017-08-11 HISTORY — DX: Cardiac murmur, unspecified: R01.1

## 2017-08-11 HISTORY — DX: Malignant (primary) neoplasm, unspecified: C80.1

## 2017-08-11 HISTORY — DX: Respiratory tuberculosis unspecified: A15.9

## 2017-08-11 HISTORY — DX: Headache: R51

## 2017-08-11 LAB — CBC WITH DIFFERENTIAL/PLATELET
Basophils Absolute: 0 10*3/uL (ref 0.0–0.1)
Basophils Relative: 0 %
EOS ABS: 0.1 10*3/uL (ref 0.0–0.7)
Eosinophils Relative: 2 %
HCT: 40.4 % (ref 36.0–46.0)
HEMOGLOBIN: 12.7 g/dL (ref 12.0–15.0)
LYMPHS ABS: 1.5 10*3/uL (ref 0.7–4.0)
LYMPHS PCT: 23 %
MCH: 27.5 pg (ref 26.0–34.0)
MCHC: 31.4 g/dL (ref 30.0–36.0)
MCV: 87.4 fL (ref 78.0–100.0)
MONOS PCT: 7 %
Monocytes Absolute: 0.5 10*3/uL (ref 0.1–1.0)
NEUTROS PCT: 68 %
Neutro Abs: 4.6 10*3/uL (ref 1.7–7.7)
Platelets: 310 10*3/uL (ref 150–400)
RBC: 4.62 MIL/uL (ref 3.87–5.11)
RDW: 13.8 % (ref 11.5–15.5)
WBC: 6.7 10*3/uL (ref 4.0–10.5)

## 2017-08-11 LAB — COMPREHENSIVE METABOLIC PANEL
ALK PHOS: 87 U/L (ref 38–126)
ALT: 23 U/L (ref 14–54)
ANION GAP: 10 (ref 5–15)
AST: 38 U/L (ref 15–41)
Albumin: 3.8 g/dL (ref 3.5–5.0)
BILIRUBIN TOTAL: 1 mg/dL (ref 0.3–1.2)
BUN: 12 mg/dL (ref 6–20)
CALCIUM: 9.3 mg/dL (ref 8.9–10.3)
CO2: 23 mmol/L (ref 22–32)
CREATININE: 0.78 mg/dL (ref 0.44–1.00)
Chloride: 103 mmol/L (ref 101–111)
Glucose, Bld: 116 mg/dL — ABNORMAL HIGH (ref 65–99)
Potassium: 5 mmol/L (ref 3.5–5.1)
SODIUM: 136 mmol/L (ref 135–145)
TOTAL PROTEIN: 6.9 g/dL (ref 6.5–8.1)

## 2017-08-11 NOTE — Progress Notes (Addendum)
Anesthesia Chart Review:  Pt requires [place] interpreter.   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   Pt is a 72 year old female scheduled for excision partial L breast mass on 08/14/2017 with Fanny Skates, MD  - PCP is Lew Dawes, MD - Saw cardiologist Jenkins Rouge, MD for pre-op eval 07/21/17 and cleared for surgery.   PMH includes:  MVP, HTN, tuberculosis (as a child). Never smoker. BMI 32  Medications include: ASA 81mg , atenolol  BP (!) 196/85   Pulse 66   Temp 36.8 C (Oral)   Resp 20   Ht 5\' 1"  (1.549 m)   Wt 168 lb 3 oz (76.3 kg)   SpO2 98%   BMI 31.78 kg/m   - BP not rechecked at pre-admission testing.  BP was 138/80 at 07/21/17 cardiology visit  Preoperative labs reviewed.    EKG 07/21/17: NSR  Echo 07/21/17:  - Left ventricle: The cavity size was normal. Wall thickness was normal. Systolic function was normal. The estimated ejection fraction was in the range of 55% to 60%. Wall motion was normal; there were no regional wall motion abnormalities. Doppler parameters are consistent with abnormal left ventricular relaxation (grade 1 diastolic dysfunction). - Aortic valve: There was trivial regurgitation. - Impressions: Normal LV systolic function; mild diastolic dysfunction; trace AI.  If no changes, I anticipate pt can proceed with surgery as scheduled.   Willeen Cass, FNP-BC [facility] Short Stay Surgical Center/Anesthesiology Phone: [phone] 08/11/2017 1:55 PM

## 2017-08-14 ENCOUNTER — Ambulatory Visit (HOSPITAL_COMMUNITY): Payer: Medicare HMO | Admitting: Emergency Medicine

## 2017-08-14 ENCOUNTER — Encounter (HOSPITAL_COMMUNITY): Payer: Self-pay | Admitting: Certified Registered Nurse Anesthetist

## 2017-08-14 ENCOUNTER — Encounter (HOSPITAL_COMMUNITY)
Admission: RE | Disposition: A | Discharge status: Home / Self Care | Payer: Self-pay | Source: Ambulatory Visit | Attending: General Surgery

## 2017-08-14 ENCOUNTER — Other Ambulatory Visit: Payer: Self-pay

## 2017-08-14 ENCOUNTER — Ambulatory Visit (HOSPITAL_COMMUNITY)
Admission: RE | Admit: 2017-08-14 | Discharge: 2017-08-14 | Disposition: A | Discharge status: Home / Self Care | Payer: Medicare HMO | Source: Ambulatory Visit | Attending: General Surgery | Admitting: General Surgery

## 2017-08-14 ENCOUNTER — Ambulatory Visit (HOSPITAL_COMMUNITY): Payer: Medicare HMO | Admitting: Certified Registered Nurse Anesthetist

## 2017-08-14 DIAGNOSIS — E559 Vitamin D deficiency, unspecified: Secondary | ICD-10-CM | POA: Diagnosis not present

## 2017-08-14 DIAGNOSIS — I10 Essential (primary) hypertension: Secondary | ICD-10-CM | POA: Insufficient documentation

## 2017-08-14 DIAGNOSIS — E785 Hyperlipidemia, unspecified: Secondary | ICD-10-CM | POA: Insufficient documentation

## 2017-08-14 DIAGNOSIS — E7800 Pure hypercholesterolemia, unspecified: Secondary | ICD-10-CM | POA: Insufficient documentation

## 2017-08-14 DIAGNOSIS — Z823 Family history of stroke: Secondary | ICD-10-CM | POA: Insufficient documentation

## 2017-08-14 DIAGNOSIS — Z79899 Other long term (current) drug therapy: Secondary | ICD-10-CM | POA: Insufficient documentation

## 2017-08-14 DIAGNOSIS — Z8249 Family history of ischemic heart disease and other diseases of the circulatory system: Secondary | ICD-10-CM | POA: Insufficient documentation

## 2017-08-14 DIAGNOSIS — C50912 Malignant neoplasm of unspecified site of left female breast: Secondary | ICD-10-CM | POA: Diagnosis not present

## 2017-08-14 DIAGNOSIS — Z833 Family history of diabetes mellitus: Secondary | ICD-10-CM | POA: Insufficient documentation

## 2017-08-14 DIAGNOSIS — Z803 Family history of malignant neoplasm of breast: Secondary | ICD-10-CM | POA: Insufficient documentation

## 2017-08-14 DIAGNOSIS — R0600 Dyspnea, unspecified: Secondary | ICD-10-CM | POA: Insufficient documentation

## 2017-08-14 DIAGNOSIS — N63 Unspecified lump in unspecified breast: Secondary | ICD-10-CM | POA: Diagnosis not present

## 2017-08-14 DIAGNOSIS — F0390 Unspecified dementia without behavioral disturbance: Secondary | ICD-10-CM | POA: Insufficient documentation

## 2017-08-14 DIAGNOSIS — R011 Cardiac murmur, unspecified: Secondary | ICD-10-CM | POA: Diagnosis not present

## 2017-08-14 DIAGNOSIS — D0512 Intraductal carcinoma in situ of left breast: Secondary | ICD-10-CM | POA: Insufficient documentation

## 2017-08-14 DIAGNOSIS — E669 Obesity, unspecified: Secondary | ICD-10-CM | POA: Insufficient documentation

## 2017-08-14 DIAGNOSIS — Z6831 Body mass index (BMI) 31.0-31.9, adult: Secondary | ICD-10-CM | POA: Insufficient documentation

## 2017-08-14 DIAGNOSIS — R51 Headache: Secondary | ICD-10-CM | POA: Insufficient documentation

## 2017-08-14 HISTORY — DX: Unspecified lump in the left breast, unspecified quadrant: N63.20

## 2017-08-14 HISTORY — PX: MASS EXCISION: SHX2000

## 2017-08-14 SURGERY — EXCISION MASS
Anesthesia: General | Site: Breast | Laterality: Left

## 2017-08-14 MED ORDER — SODIUM CHLORIDE 0.9% FLUSH: 3.0000 mL | INTRAVENOUS | Status: DC | PRN

## 2017-08-14 MED ORDER — LACTATED RINGERS IV SOLN
INTRAVENOUS | Status: DC
  Administered 2017-08-14: 09:00:00 via INTRAVENOUS

## 2017-08-14 MED ORDER — OXYCODONE HCL 5 MG/5ML PO SOLN: 5.0000 mg | Freq: Once | ORAL | Status: DC | PRN

## 2017-08-14 MED ORDER — CHLORHEXIDINE GLUCONATE CLOTH 2 % EX PADS: 6.0000 | MEDICATED_PAD | Freq: Once | CUTANEOUS | Status: DC

## 2017-08-14 MED ORDER — CEFAZOLIN SODIUM-DEXTROSE 2-4 GM/100ML-% IV SOLN
2.0000 g | INTRAVENOUS | Status: AC
  Administered 2017-08-14: 2 g via INTRAVENOUS
  Filled 2017-08-14: qty 100

## 2017-08-14 MED ORDER — ONDANSETRON HCL 4 MG/2ML IJ SOLN
INTRAMUSCULAR | Status: DC | PRN
  Administered 2017-08-14: 4 mg via INTRAVENOUS

## 2017-08-14 MED ORDER — EPHEDRINE SULFATE 50 MG/ML IJ SOLN
INTRAMUSCULAR | Status: DC | PRN
  Administered 2017-08-14 (×2): 10 mg via INTRAVENOUS
  Administered 2017-08-14 (×2): 5 mg via INTRAVENOUS

## 2017-08-14 MED ORDER — PROPOFOL 10 MG/ML IV BOLUS
INTRAVENOUS | Status: AC
  Filled 2017-08-14: qty 20

## 2017-08-14 MED ORDER — ONDANSETRON HCL 4 MG/2ML IJ SOLN
INTRAMUSCULAR | Status: AC
  Filled 2017-08-14: qty 2

## 2017-08-14 MED ORDER — ACETAMINOPHEN 500 MG PO TABS
1000.0000 mg | ORAL_TABLET | ORAL | Status: AC
  Administered 2017-08-14: 1000 mg via ORAL
  Filled 2017-08-14: qty 2

## 2017-08-14 MED ORDER — LIDOCAINE-EPINEPHRINE 1 %-1:100000 IJ SOLN
INTRAMUSCULAR | Status: AC
  Filled 2017-08-14: qty 1

## 2017-08-14 MED ORDER — LIDOCAINE HCL (CARDIAC) 20 MG/ML IV SOLN
INTRAVENOUS | Status: AC
  Filled 2017-08-14: qty 5

## 2017-08-14 MED ORDER — LACTATED RINGERS IV SOLN: INTRAVENOUS | Status: DC

## 2017-08-14 MED ORDER — FENTANYL CITRATE (PF) 250 MCG/5ML IJ SOLN
INTRAMUSCULAR | Status: AC
  Filled 2017-08-14: qty 5

## 2017-08-14 MED ORDER — PHENYLEPHRINE HCL 10 MG/ML IJ SOLN
INTRAMUSCULAR | Status: DC | PRN
  Administered 2017-08-14: 80 ug via INTRAVENOUS
  Administered 2017-08-14: 100 ug via INTRAVENOUS
  Administered 2017-08-14 (×3): 40 ug via INTRAVENOUS
  Administered 2017-08-14: 100 ug via INTRAVENOUS

## 2017-08-14 MED ORDER — DEXAMETHASONE SODIUM PHOSPHATE 10 MG/ML IJ SOLN
INTRAMUSCULAR | Status: DC | PRN
  Administered 2017-08-14: 10 mg via INTRAVENOUS

## 2017-08-14 MED ORDER — GABAPENTIN 300 MG PO CAPS
300.0000 mg | ORAL_CAPSULE | ORAL | Status: AC
  Administered 2017-08-14: 300 mg via ORAL
  Filled 2017-08-14: qty 1

## 2017-08-14 MED ORDER — OXYCODONE HCL 5 MG PO TABS: 5.0000 mg | ORAL_TABLET | ORAL | Status: DC | PRN

## 2017-08-14 MED ORDER — SODIUM CHLORIDE 0.9% FLUSH: 3.0000 mL | Freq: Two times a day (BID) | INTRAVENOUS | Status: DC

## 2017-08-14 MED ORDER — ACETAMINOPHEN 650 MG RE SUPP: 650.0000 mg | RECTAL | Status: DC | PRN

## 2017-08-14 MED ORDER — ONDANSETRON HCL 4 MG/2ML IJ SOLN
4.0000 mg | Freq: Once | INTRAMUSCULAR | Status: DC | PRN
Start: 2017-08-14 — End: 2017-08-14

## 2017-08-14 MED ORDER — LIDOCAINE HCL (CARDIAC) 20 MG/ML IV SOLN
INTRAVENOUS | Status: DC | PRN
  Administered 2017-08-14: 60 mg via INTRAVENOUS
  Administered 2017-08-14: 10 mg via INTRAVENOUS

## 2017-08-14 MED ORDER — OXYCODONE HCL 5 MG PO TABS: 5.0000 mg | ORAL_TABLET | Freq: Once | ORAL | Status: DC | PRN

## 2017-08-14 MED ORDER — FENTANYL CITRATE (PF) 100 MCG/2ML IJ SOLN: 25.0000 ug | INTRAMUSCULAR | Status: DC | PRN

## 2017-08-14 MED ORDER — LIDOCAINE-EPINEPHRINE (PF) 1 %-1:200000 IJ SOLN
INTRAMUSCULAR | Status: DC | PRN
  Administered 2017-08-14: 10 mL

## 2017-08-14 MED ORDER — DEXAMETHASONE SODIUM PHOSPHATE 10 MG/ML IJ SOLN
INTRAMUSCULAR | Status: AC
  Filled 2017-08-14: qty 1

## 2017-08-14 MED ORDER — HYDROCODONE-ACETAMINOPHEN 5-325 MG PO TABS: 1.0000 | ORAL_TABLET | Freq: Four times a day (QID) | ORAL | 0 refills | Status: DC | PRN

## 2017-08-14 MED ORDER — FENTANYL CITRATE (PF) 100 MCG/2ML IJ SOLN
INTRAMUSCULAR | Status: DC | PRN
  Administered 2017-08-14 (×2): 50 ug via INTRAVENOUS

## 2017-08-14 MED ORDER — PROPOFOL 10 MG/ML IV BOLUS
INTRAVENOUS | Status: DC | PRN
  Administered 2017-08-14: 150 mg via INTRAVENOUS

## 2017-08-14 MED ORDER — SODIUM CHLORIDE 0.9 % IV SOLN: 250.0000 mL | INTRAVENOUS | Status: DC | PRN

## 2017-08-14 MED ORDER — ACETAMINOPHEN 325 MG PO TABS: 650.0000 mg | ORAL_TABLET | ORAL | Status: DC | PRN

## 2017-08-14 SURGICAL SUPPLY — 41 items
BENZOIN TINCTURE PRP APPL 2/3 (GAUZE/BANDAGES/DRESSINGS) ×2 IMPLANT
BINDER BREAST XLRG (GAUZE/BANDAGES/DRESSINGS) ×2 IMPLANT
CANISTER SUCT 3000ML PPV (MISCELLANEOUS) IMPLANT
CHLORAPREP W/TINT 26ML (MISCELLANEOUS) ×2 IMPLANT
COVER SURGICAL LIGHT HANDLE (MISCELLANEOUS) ×2 IMPLANT
DECANTER SPIKE VIAL GLASS SM (MISCELLANEOUS) IMPLANT
DERMABOND ADVANCED (GAUZE/BANDAGES/DRESSINGS) ×1
DERMABOND ADVANCED .7 DNX12 (GAUZE/BANDAGES/DRESSINGS) ×1 IMPLANT
DRAPE LAPAROTOMY 100X72 PEDS (DRAPES) ×2 IMPLANT
DRAPE UTILITY XL STRL (DRAPES) ×4 IMPLANT
ELECT CAUTERY BLADE 6.4 (BLADE) ×2 IMPLANT
ELECT REM PT RETURN 9FT ADLT (ELECTROSURGICAL) ×2
ELECTRODE REM PT RTRN 9FT ADLT (ELECTROSURGICAL) ×1 IMPLANT
GAUZE SPONGE 4X4 12PLY STRL (GAUZE/BANDAGES/DRESSINGS) ×2 IMPLANT
GAUZE SPONGE 4X4 12PLY STRL LF (GAUZE/BANDAGES/DRESSINGS) ×2 IMPLANT
GAUZE SPONGE 4X4 16PLY XRAY LF (GAUZE/BANDAGES/DRESSINGS) ×2 IMPLANT
GLOVE EUDERMIC 7 POWDERFREE (GLOVE) ×2 IMPLANT
GOWN STRL REUS W/ TWL LRG LVL3 (GOWN DISPOSABLE) ×1 IMPLANT
GOWN STRL REUS W/ TWL XL LVL3 (GOWN DISPOSABLE) ×1 IMPLANT
GOWN STRL REUS W/TWL LRG LVL3 (GOWN DISPOSABLE) ×1
GOWN STRL REUS W/TWL XL LVL3 (GOWN DISPOSABLE) ×1
KIT TURNOVER KIT B (KITS) ×2 IMPLANT
NEEDLE HYPO 25GX1X1/2 BEV (NEEDLE) IMPLANT
NS IRRIG 1000ML POUR BTL (IV SOLUTION) ×2 IMPLANT
PACK SURGICAL SETUP 50X90 (CUSTOM PROCEDURE TRAY) ×2 IMPLANT
PAD ABD 8X10 STRL (GAUZE/BANDAGES/DRESSINGS) ×2 IMPLANT
PAD ARMBOARD 7.5X6 YLW CONV (MISCELLANEOUS) ×4 IMPLANT
PENCIL BUTTON HOLSTER BLD 10FT (ELECTRODE) ×2 IMPLANT
SPECIMEN JAR SMALL (MISCELLANEOUS) ×2 IMPLANT
SPONGE LAP 4X18 X RAY DECT (DISPOSABLE) IMPLANT
STAPLER VISISTAT 35W (STAPLE) IMPLANT
STRIP CLOSURE SKIN 1/2X4 (GAUZE/BANDAGES/DRESSINGS) ×2 IMPLANT
SUT MNCRL AB 4-0 PS2 18 (SUTURE) ×2 IMPLANT
SUT VIC AB 3-0 SH 27 (SUTURE) ×1
SUT VIC AB 3-0 SH 27XBRD (SUTURE) ×1 IMPLANT
SYR BULB 3OZ (MISCELLANEOUS) ×2 IMPLANT
SYR CONTROL 10ML LL (SYRINGE) IMPLANT
TOWEL OR 17X24 6PK STRL BLUE (TOWEL DISPOSABLE) ×2 IMPLANT
TOWEL OR 17X26 10 PK STRL BLUE (TOWEL DISPOSABLE) ×2 IMPLANT
TUBE CONNECTING 12X1/4 (SUCTIONS) IMPLANT
YANKAUER SUCT BULB TIP NO VENT (SUCTIONS) IMPLANT

## 2017-08-14 NOTE — Interval H&P Note (Signed)
History and Physical Interval Note:  08/14/2017 9:34 AM  Robin Estrada  has presented today for surgery, with the diagnosis of large mass left breast  The various methods of treatment have been discussed with the patient and family. After consideration of risks, benefits and other options for treatment, the patient has consented to  Procedure(s): PARTIAL EXCISION  LEFT [place] (Left) as a surgical intervention .  The patient's history has been reviewed, patient examined, no change in status, stable for surgery.  I have reviewed the patient's chart and labs.  Questions were answered to the patient's satisfaction.     Adin Hector

## 2017-08-14 NOTE — Progress Notes (Signed)
Interpreter present

## 2017-08-14 NOTE — Discharge Instructions (Signed)
[facility] °Office Phone Number [phone] ° °BREAST BIOPSY/ PARTIAL MASTECTOMY: POST OP INSTRUCTIONS ° °Always review your discharge instruction sheet given to you by the facility where your surgery was performed. ° °IF YOU HAVE DISABILITY OR FAMILY LEAVE FORMS, YOU MUST BRING THEM TO THE OFFICE FOR PROCESSING.  DO NOT GIVE THEM TO YOUR DOCTOR. ° °1. A prescription for pain medication may be given to you upon discharge.  Take your pain medication as prescribed, if needed.  If narcotic pain medicine is not needed, then you may take acetaminophen (Tylenol) or ibuprofen (Advil) as needed. °2. Take your usually prescribed medications unless otherwise directed °3. If you need a refill on your pain medication, please contact your pharmacy.  They will contact our office to request authorization.  Prescriptions will not be filled after 5pm or on week-ends. °4. You should eat very light the first 24 hours after surgery, such as soup, crackers, pudding, etc.  Resume your normal diet the day after surgery. °5. Most patients will experience some swelling and bruising in the breast.  Ice packs and a good support bra will help.  Swelling and bruising can take several days to resolve.  °6. It is common to experience some constipation if taking pain medication after surgery.  Increasing fluid intake and taking a stool softener will usually help or prevent this problem from occurring.  A mild laxative (Milk of Magnesia or Miralax) should be taken according to package directions if there are no bowel movements after 48 hours. °7. Unless discharge instructions indicate otherwise, you may remove your bandages 24-48 hours after surgery, and you may shower at that time.  You may have steri-strips (small skin tapes) in place directly over the incision.  These strips should be left on the skin for 7-10 days.  If your surgeon used skin glue on the incision, you may shower in 24 hours.  The glue will flake off over the  next 2-3 weeks.  Any sutures or staples will be removed at the office during your follow-up visit. °8. ACTIVITIES:  You may resume regular daily activities (gradually increasing) beginning the next day.  Wearing a good support bra or sports bra minimizes pain and swelling.  You may have sexual intercourse when it is comfortable. °a. You may drive when you no longer are taking prescription pain medication, you can comfortably wear a seatbelt, and you can safely maneuver your car and apply brakes. °b. RETURN TO WORK:  ______________________________________________________________________________________ °9. You should see your doctor in the office for a follow-up appointment approximately two weeks after your surgery.  Your doctor’s nurse will typically make your follow-up appointment when she calls you with your pathology report.  Expect your pathology report 2-3 business days after your surgery.  You may call to check if you do not hear from us after three days. °10. OTHER INSTRUCTIONS: _______________________________________________________________________________________________ _____________________________________________________________________________________________________________________________________ °_____________________________________________________________________________________________________________________________________ °_____________________________________________________________________________________________________________________________________ ° °WHEN TO CALL YOUR DOCTOR: °1. Fever over 101.0 °2. Nausea and/or vomiting. °3. Extreme swelling or bruising. °4. Continued bleeding from incision. °5. Increased pain, redness, or drainage from the incision. ° °The clinic staff is available to answer your questions during regular business hours.  Please don’t hesitate to call and ask to speak to one of the nurses for clinical concerns.  If you have a medical emergency, go to the nearest  emergency room or call 911.  A surgeon from [facility] is always on call at the hospital. ° °For further questions, please visit [email or web address]  °
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
## 2017-08-14 NOTE — Anesthesia Preprocedure Evaluation (Addendum)
Anesthesia Evaluation  Patient identified by MRN, date of birth, ID band Patient awake    Reviewed: Allergy & Precautions, NPO status , Patient's Chart, lab work & pertinent test results, reviewed documented beta blocker date and time   Airway Mallampati: II  TM Distance: >3 FB Neck ROM: Full    Dental  (+) Dental Advisory Given   Pulmonary  Hx TB as child   Pulmonary exam normal breath sounds clear to auscultation       Cardiovascular hypertension, Pt. on medications and Pt. on home beta blockers Normal cardiovascular exam Rhythm:Regular Rate:Normal  '19 TTE - EF 55% to 60%. Grade 1 diastolic dysfunction. Trivial AI, MR, and TR. No mention of MVP (listed in history).   Neuro/Psych  Headaches, Dementia    GI/Hepatic negative GI ROS, Neg liver ROS,   Endo/Other  Obesity  Renal/GU negative Renal ROS  negative genitourinary   Musculoskeletal negative musculoskeletal ROS (+)   Abdominal   Peds  Hematology negative hematology ROS (+)   Anesthesia Other Findings   Reproductive/Obstetrics                             Anesthesia Physical Anesthesia Plan  ASA: II  Anesthesia Plan: General   Post-op Pain Management:    Induction: Intravenous  PONV Risk Score and Plan: 3 and Treatment may vary due to age or medical condition, Ondansetron and Dexamethasone  Airway Management Planned: LMA  Additional Equipment: None  Intra-op Plan:   Post-operative Plan: Extubation in OR  Informed Consent: I have reviewed the patients History and Physical, chart, labs and discussed the procedure including the risks, benefits and alternatives for the proposed anesthesia with the patient or authorized representative who has indicated his/her understanding and acceptance.   Dental advisory given  Plan Discussed with: CRNA and Anesthesiologist  Anesthesia Plan Comments:         Anesthesia Quick  Evaluation

## 2017-08-14 NOTE — Anesthesia Postprocedure Evaluation (Signed)
Anesthesia Post Note  Patient: Robin Estrada  Procedure(s) Performed: PARTIAL EXCISION  LEFT BREAST  MASS ERAS PATHWAY (Left Breast)     Patient location during evaluation: PACU Anesthesia Type: General Level of consciousness: awake and alert Pain management: pain level controlled Vital Signs Assessment: post-procedure vital signs reviewed and stable Respiratory status: spontaneous breathing, nonlabored ventilation and respiratory function stable Cardiovascular status: blood pressure returned to baseline and stable Postop Assessment: no apparent nausea or vomiting Anesthetic complications: no    Last Vitals:  Vitals:   08/14/17 1136 08/14/17 1150  BP: (!) 143/63 (!) 149/65  Pulse: 69 71  Resp: 13 (!) 29  Temp:    SpO2: 96% 97%    Last Pain:  Vitals:   08/14/17 1150  TempSrc:   PainSc: 0-No pain                 Audry Pili

## 2017-08-14 NOTE — Transfer of Care (Signed)
Immediate Anesthesia Transfer of Care Note  Patient: Robin Estrada  Procedure(s) Performed: PARTIAL EXCISION  LEFT BREAST  MASS ERAS PATHWAY (Left Breast)  Patient Location: PACU  Anesthesia Type:General  Level of Consciousness: awake, alert , oriented and patient cooperative  Airway & Oxygen Therapy: Patient Spontanous Breathing and Patient connected to face mask oxygen  Post-op Assessment: Report given to RN and Post -op Vital signs reviewed and stable  Post vital signs: Reviewed and stable  Last Vitals:  Vitals Value Taken Time  BP 136/57 08/14/2017 11:05 AM  Temp    Pulse 75 08/14/2017 11:07 AM  Resp 22 08/14/2017 11:07 AM  SpO2 100 % 08/14/2017 11:07 AM  Vitals shown include unvalidated device data.  Last Pain:  Vitals:   08/14/17 0850  TempSrc: Oral  PainSc:       Patients Stated Pain Goal: 3 (95/62/13 0865)  Complications: No apparent anesthesia complications

## 2017-08-14 NOTE — Anesthesia Procedure Notes (Signed)
Procedure Name: LMA Insertion Date/Time: 08/14/2017 10:11 AM Performed by: Raenette Rover, CRNA Pre-anesthesia Checklist: Patient identified, Emergency Drugs available, Suction available and Patient being monitored Patient Re-evaluated:Patient Re-evaluated prior to induction Oxygen Delivery Method: Circle system utilized Preoxygenation: Pre-oxygenation with 100% oxygen Induction Type: IV induction LMA: LMA inserted LMA Size: 4.0 Number of attempts: 1 Placement Confirmation: positive ETCO2,  CO2 detector and breath sounds checked- equal and bilateral Tube secured with: Tape Dental Injury: Teeth and Oropharynx as per pre-operative assessment

## 2017-08-15 ENCOUNTER — Encounter (HOSPITAL_COMMUNITY): Payer: Self-pay | Admitting: General Surgery

## 2017-08-16 LAB — AEROBIC CULTURE W GRAM STAIN (SUPERFICIAL SPECIMEN): Culture: NO GROWTH

## 2017-08-16 LAB — AEROBIC CULTURE  (SUPERFICIAL SPECIMEN)

## 2017-08-19 ENCOUNTER — Other Ambulatory Visit: Payer: Self-pay | Admitting: General Surgery

## 2017-08-19 DIAGNOSIS — C50112 Malignant neoplasm of central portion of left female breast: Secondary | ICD-10-CM

## 2017-08-19 LAB — ANAEROBIC CULTURE

## 2017-08-21 ENCOUNTER — Telehealth: Payer: Self-pay | Admitting: Oncology

## 2017-08-21 NOTE — Telephone Encounter (Signed)
Called and spoke with patient, patients son .  He was given appt date/time/location. He stated that he was going to call Dr Grayland Jack office to see if she really needed this appt prior to surgery.  He stated he would call back if they wanted to reschedule.  Referring office was notified also

## 2017-08-24 ENCOUNTER — Encounter: Payer: Self-pay | Admitting: Oncology

## 2017-08-24 ENCOUNTER — Telehealth: Payer: Self-pay | Admitting: Oncology

## 2017-08-24 ENCOUNTER — Telehealth: Payer: Self-pay | Admitting: *Deleted

## 2017-08-24 ENCOUNTER — Encounter: Payer: Self-pay | Admitting: Radiation Oncology

## 2017-08-24 NOTE — Telephone Encounter (Signed)
Pt's new patient appt has been rescheduled for the pt to see Dr. Jana Hakim on 5/8 at 4pm. Letter mailed.

## 2017-08-25 ENCOUNTER — Ambulatory Visit: Payer: Medicare HMO | Admitting: Oncology
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
## 2017-08-31 DIAGNOSIS — C50912 Malignant neoplasm of unspecified site of left female breast: Secondary | ICD-10-CM | POA: Diagnosis not present

## 2017-08-31 DIAGNOSIS — Z803 Family history of malignant neoplasm of breast: Secondary | ICD-10-CM | POA: Diagnosis not present

## 2017-08-31 DIAGNOSIS — N61 Mastitis without abscess: Secondary | ICD-10-CM | POA: Diagnosis not present

## 2017-09-01 ENCOUNTER — Encounter (HOSPITAL_COMMUNITY): Payer: Self-pay | Admitting: *Deleted

## 2017-09-01 ENCOUNTER — Other Ambulatory Visit: Payer: Self-pay

## 2017-09-01 NOTE — Progress Notes (Signed)
Pt SDW-Pre-op cal was completed using [place] interpreter Vicente Males, # [id number]. Pt gave verbal consent that son can listen in on conference call. Pt denies SOB, chest pain, and being under the care of a cardiologist. See anesthesia note dated 08/11/17. Pt stated that history is unchanged from previous surgery except changes with left breast. Pt made aware to stop taking Aspirin, vitamins, fish oil and herbal medications. Do not take any NSAIDs ie: Ibuprofen, Advil, Naproxen Aleve, Motrin, BC and Goody Powder. Pt verbalized understanding of all pre-op instructions.

## 2017-09-02 ENCOUNTER — Telehealth: Payer: Self-pay | Admitting: Oncology

## 2017-09-02 ENCOUNTER — Encounter (HOSPITAL_COMMUNITY): Payer: Self-pay | Admitting: General Surgery

## 2017-09-02 ENCOUNTER — Ambulatory Visit (HOSPITAL_COMMUNITY): Payer: Medicare HMO

## 2017-09-02 NOTE — Telephone Encounter (Signed)
Pt's appt has been rescheduled with her son, [name], to see Dr. Jana Hakim on 5/20 at [place] his mother should arrive 30 minutes early. Voiced understanding.

## 2017-09-13 NOTE — H&P (Signed)
Robin Estrada Location: [facility] Patient #: [id number] DOB: 1945-11-25 Divorced / Language: [place] / Race: White Female      History of Present Illness       This is a 72 year old female. 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 Once again here with her son for a preop visit. She has a  malignancy involving all 4 quadrants of her left breast and is scheduled for left mastectomy and sentinel node biopsy The mass has grown in the last 2 months. It is still mobile and not fixed to the chest wall. I saw her on August 27, 2017 and she has some erythema around the incision but no abscess. We put her on Bactrim and that made her nauseated and she's been taking amoxicillin which she says she has at home but is running out.  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 Dr. Lyndon Code says this is a malignancy and must be removed and the consensus in breast conference was the same. She was resistant to surgery at first and slow to make decisions, but now isa ready to go. They have a printed copy of the pathology report.        I previously referred her to medical oncology and radiation oncology, and she canceled those appointments       Exam today looked a little like cellulitis but no worse. I opened the wound and drain some serous fluid without odor. Hopefully this will help We going to keep her on the OR schedule for left mastectomy with sentinel node biopsy  I called in a prescription for Augmentin 875 mg twice a day. 7 day supply. I told her to start taking this tonight. I told her this had amoxicillin in it.   Allergies  No Known Drug Allergies   Medication History  Losartan Potassium (100MG  Tablet, Oral) Active. Vitamin D (1000UNIT Tablet, Oral) Active. Tenormin (50MG  Tablet, Oral) Active. Butalbital-Acetaminophen (50-300MG  Capsule, Oral)  Active. Icosapent Ethyl (0.5GM Capsule, Oral) Active. Medications Reconciled  Vitals  Weight: 165.2 lb Height: 64in Body Surface Area: 1.8 m Body Mass Index: 28.36 kg/m  Temp.: 98.10F(Oral)  Pulse: 74 (Regular)  BP: 130/82 (Sitting, Left Arm, Standard)    Physical Exam General Mental Status-Alert. General Appearance-Not in acute distress. Build & Nutrition-Well nourished. Posture-Normal posture. Gait-Normal.  Head and Neck Head-normocephalic, atraumatic with no lesions or palpable masses. Trachea-midline. Thyroid Gland Characteristics - normal size and consistency and no palpable nodules.  Chest and Lung Exam Chest and lung exam reveals -on auscultation, normal breath sounds, no adventitious sounds and normal vocal resonance.  Breast Note: Left breast is examined. Large mobile mass in all 4 quadrants. Transverse incision laterally it is still intact without drainage or necrosis. After alcohol prep I opened this up a little bit and drained 20 or 30 cc of serous fluid and a little bit of debris. Dry gauze bandage was placed. She tolerated this reasonably well considering   Cardiovascular Cardiovascular examination reveals -normal heart sounds, regular rate and rhythm with no murmurs and femoral artery auscultation bilaterally reveals normal pulses, no bruits, no thrills.  Abdomen Inspection Inspection of the abdomen reveals - No Hernias. Palpation/Percussion Palpation and Percussion of the abdomen reveal - Soft, Non Tender, No Rigidity (guarding), No hepatosplenomegaly and No Palpable abdominal masses.  Neurologic Neurologic evaluation reveals -alert and oriented x 3 with no impairment of recent or remote memory, normal attention span and ability to concentrate, normal  sensation and normal coordination.  Musculoskeletal Normal Exam - Bilateral-Upper Extremity Strength Normal and Lower Extremity Strength Normal.    Assessment &  Plan  BREAST CANCER, LEFT (C50.912)  The final report on your left breast pathology is completed now  Since the pathology report from [facility] confirms a mixed malignancy. I think we should proceed with the left mastectomy as soon as possible. You agree with this plan. Indications, techniques, and risks have been discussed in detail several times now.   CELLULITIS OF FEMALE BREAST (N61.0) FAMILY HISTORY OF BREAST CANCER IN SISTER (Z80.3)

## 2017-09-14 ENCOUNTER — Encounter (HOSPITAL_COMMUNITY)
Admission: RE | Disposition: A | Discharge status: Home / Self Care | Payer: Self-pay | Source: Ambulatory Visit | Attending: General Surgery

## 2017-09-14 ENCOUNTER — Encounter (HOSPITAL_COMMUNITY)
Admission: RE | Admit: 2017-09-14 | Discharge: 2017-09-14 | Disposition: A | Discharge status: Home / Self Care | Payer: Medicare HMO | Source: Ambulatory Visit | Attending: General Surgery | Admitting: General Surgery

## 2017-09-14 ENCOUNTER — Ambulatory Visit (HOSPITAL_COMMUNITY)
Admission: RE | Admit: 2017-09-14 | Discharge: 2017-09-15 | Disposition: A | Discharge status: Home / Self Care | Payer: Medicare HMO | Source: Ambulatory Visit | Attending: General Surgery | Admitting: General Surgery

## 2017-09-14 ENCOUNTER — Encounter (HOSPITAL_COMMUNITY): Payer: Self-pay | Admitting: General Surgery

## 2017-09-14 ENCOUNTER — Ambulatory Visit (HOSPITAL_COMMUNITY): Payer: Medicare HMO | Admitting: Anesthesiology

## 2017-09-14 DIAGNOSIS — E785 Hyperlipidemia, unspecified: Secondary | ICD-10-CM | POA: Diagnosis not present

## 2017-09-14 DIAGNOSIS — C50812 Malignant neoplasm of overlapping sites of left female breast: Secondary | ICD-10-CM | POA: Diagnosis present

## 2017-09-14 DIAGNOSIS — I10 Essential (primary) hypertension: Secondary | ICD-10-CM | POA: Diagnosis not present

## 2017-09-14 DIAGNOSIS — N61 Mastitis without abscess: Secondary | ICD-10-CM | POA: Diagnosis not present

## 2017-09-14 DIAGNOSIS — Z79899 Other long term (current) drug therapy: Secondary | ICD-10-CM | POA: Insufficient documentation

## 2017-09-14 DIAGNOSIS — G8918 Other acute postprocedural pain: Secondary | ICD-10-CM | POA: Diagnosis not present

## 2017-09-14 DIAGNOSIS — C50912 Malignant neoplasm of unspecified site of left female breast: Secondary | ICD-10-CM | POA: Diagnosis not present

## 2017-09-14 DIAGNOSIS — F0390 Unspecified dementia without behavioral disturbance: Secondary | ICD-10-CM | POA: Diagnosis not present

## 2017-09-14 DIAGNOSIS — C50112 Malignant neoplasm of central portion of left female breast: Secondary | ICD-10-CM

## 2017-09-14 DIAGNOSIS — Z803 Family history of malignant neoplasm of breast: Secondary | ICD-10-CM | POA: Insufficient documentation

## 2017-09-14 DIAGNOSIS — Z171 Estrogen receptor negative status [ER-]: Secondary | ICD-10-CM | POA: Diagnosis present

## 2017-09-14 DIAGNOSIS — R69 Illness, unspecified: Secondary | ICD-10-CM | POA: Diagnosis not present

## 2017-09-14 HISTORY — PX: MASTECTOMY W/ SENTINEL NODE BIOPSY: SHX2001

## 2017-09-14 LAB — COMPREHENSIVE METABOLIC PANEL
ALBUMIN: 3.6 g/dL (ref 3.5–5.0)
ALT: 23 U/L (ref 14–54)
AST: 20 U/L (ref 15–41)
Alkaline Phosphatase: 77 U/L (ref 38–126)
Anion gap: 8 (ref 5–15)
BUN: 9 mg/dL (ref 6–20)
CO2: 25 mmol/L (ref 22–32)
Calcium: 9.2 mg/dL (ref 8.9–10.3)
Chloride: 105 mmol/L (ref 101–111)
Creatinine, Ser: 0.79 mg/dL (ref 0.44–1.00)
GFR calc Af Amer: >60 mL/min (ref >60)
Glucose, Bld: 127 mg/dL — ABNORMAL HIGH (ref 65–99)
Potassium: 4.1 mmol/L (ref 3.5–5.1)
Sodium: 138 mmol/L (ref 135–145)
Total Bilirubin: 0.7 mg/dL (ref 0.3–1.2)
Total Protein: 6.9 g/dL (ref 6.5–8.1)

## 2017-09-14 LAB — CBC WITH DIFFERENTIAL/PLATELET
BASOS ABS: 0 10*3/uL (ref 0.0–0.1)
BASOS PCT: 1 %
EOS PCT: 2 %
Eosinophils Absolute: 0.1 10*3/uL (ref 0.0–0.7)
HCT: 36.2 % (ref 36.0–46.0)
Hemoglobin: 11.4 g/dL — ABNORMAL LOW (ref 12.0–15.0)
Lymphocytes Relative: 20 %
Lymphs Abs: 1.5 10*3/uL (ref 0.7–4.0)
MCH: 27.1 pg (ref 26.0–34.0)
MCHC: 31.5 g/dL (ref 30.0–36.0)
MCV: 86.2 fL (ref 78.0–100.0)
MONO ABS: 0.6 10*3/uL (ref 0.1–1.0)
Monocytes Relative: 9 %
Neutro Abs: 5.2 10*3/uL (ref 1.7–7.7)
Neutrophils Relative %: 70 %
PLATELETS: 292 10*3/uL (ref 150–400)
RBC: 4.2 MIL/uL (ref 3.87–5.11)
RDW: 13.9 % (ref 11.5–15.5)
WBC: 7.4 10*3/uL (ref 4.0–10.5)

## 2017-09-14 LAB — PROTIME-INR
INR: 0.98
PROTHROMBIN TIME: 12.9 s (ref 11.4–15.2)

## 2017-09-14 LAB — APTT: APTT: 29 s (ref 24–36)

## 2017-09-14 SURGERY — MASTECTOMY WITH SENTINEL LYMPH NODE BIOPSY
Anesthesia: General | Site: Breast | Laterality: Left

## 2017-09-14 MED ORDER — TRAMADOL HCL 50 MG PO TABS
50.0000 mg | ORAL_TABLET | Freq: Four times a day (QID) | ORAL | Status: DC | PRN
Start: 2017-09-14 — End: 2017-09-15

## 2017-09-14 MED ORDER — CEFAZOLIN SODIUM-DEXTROSE 2-4 GM/100ML-% IV SOLN
2.0000 g | Freq: Three times a day (TID) | INTRAVENOUS | Status: AC
  Administered 2017-09-14: 2 g via INTRAVENOUS
  Filled 2017-09-14: qty 100

## 2017-09-14 MED ORDER — ONDANSETRON HCL 4 MG/2ML IJ SOLN
4.0000 mg | Freq: Once | INTRAMUSCULAR | Status: AC | PRN
  Administered 2017-09-14: 4 mg via INTRAVENOUS

## 2017-09-14 MED ORDER — 0.9 % SODIUM CHLORIDE (POUR BTL) OPTIME
TOPICAL | Status: DC | PRN
  Administered 2017-09-14 (×2): 1000 mL

## 2017-09-14 MED ORDER — METHYLENE BLUE 0.5 % INJ SOLN
INTRAVENOUS | Status: AC
  Filled 2017-09-14: qty 10

## 2017-09-14 MED ORDER — VITAMIN D 1000 UNITS PO TABS
1000.0000 [IU] | ORAL_TABLET | Freq: Every day | ORAL | Status: DC
  Administered 2017-09-14 – 2017-09-15 (×2): 1000 [IU] via ORAL
  Filled 2017-09-14 (×2): qty 1

## 2017-09-14 MED ORDER — TECHNETIUM TC 99M SULFUR COLLOID FILTERED
1.0000 | Freq: Once | INTRAVENOUS | Status: AC | PRN
  Administered 2017-09-14: 1 via INTRADERMAL

## 2017-09-14 MED ORDER — PROPOFOL 10 MG/ML IV BOLUS
INTRAVENOUS | Status: AC
  Filled 2017-09-14: qty 20

## 2017-09-14 MED ORDER — METHOCARBAMOL 500 MG PO TABS
500.0000 mg | ORAL_TABLET | Freq: Four times a day (QID) | ORAL | Status: DC | PRN
  Administered 2017-09-15: 500 mg via ORAL
  Filled 2017-09-14: qty 1

## 2017-09-14 MED ORDER — ICOSAPENT ETHYL 1 G PO CAPS: 2.0000 | ORAL_CAPSULE | Freq: Two times a day (BID) | ORAL | Status: DC

## 2017-09-14 MED ORDER — PROPOFOL 10 MG/ML IV BOLUS
INTRAVENOUS | Status: DC | PRN
  Administered 2017-09-14: 110 mg via INTRAVENOUS

## 2017-09-14 MED ORDER — EPHEDRINE SULFATE 50 MG/ML IJ SOLN
INTRAMUSCULAR | Status: DC | PRN
  Administered 2017-09-14: 10 mg via INTRAVENOUS

## 2017-09-14 MED ORDER — ONDANSETRON HCL 4 MG/2ML IJ SOLN
INTRAMUSCULAR | Status: AC
  Filled 2017-09-14: qty 2

## 2017-09-14 MED ORDER — GABAPENTIN 300 MG PO CAPS
ORAL_CAPSULE | ORAL | Status: AC
  Administered 2017-09-14: 300 mg
  Filled 2017-09-14: qty 1

## 2017-09-14 MED ORDER — DEXAMETHASONE SODIUM PHOSPHATE 10 MG/ML IJ SOLN
INTRAMUSCULAR | Status: AC
  Filled 2017-09-14: qty 2

## 2017-09-14 MED ORDER — ACETAMINOPHEN 500 MG PO TABS: 1000.0000 mg | ORAL_TABLET | Freq: Four times a day (QID) | ORAL | Status: DC | PRN

## 2017-09-14 MED ORDER — ACETAMINOPHEN 500 MG PO TABS: 1000.0000 mg | ORAL_TABLET | ORAL | Status: DC

## 2017-09-14 MED ORDER — BUTALBITAL-APAP-CAFFEINE 50-325-40 MG PO TABS
1.0000 | ORAL_TABLET | Freq: Two times a day (BID) | ORAL | Status: DC | PRN
  Administered 2017-09-15: 1 via ORAL
  Filled 2017-09-14: qty 1

## 2017-09-14 MED ORDER — HYDROCODONE-ACETAMINOPHEN 5-325 MG PO TABS
1.0000 | ORAL_TABLET | ORAL | Status: DC | PRN
  Administered 2017-09-14 – 2017-09-15 (×3): 1 via ORAL
  Filled 2017-09-14 (×3): qty 1

## 2017-09-14 MED ORDER — ONDANSETRON HCL 4 MG/2ML IJ SOLN: 4.0000 mg | Freq: Four times a day (QID) | INTRAMUSCULAR | Status: DC | PRN

## 2017-09-14 MED ORDER — SORBITOL 70 % SOLN
30.0000 mL | Freq: Every day | Status: DC | PRN
  Filled 2017-09-14: qty 30

## 2017-09-14 MED ORDER — ONDANSETRON HCL 4 MG/2ML IJ SOLN
INTRAMUSCULAR | Status: AC
  Filled 2017-09-14: qty 4

## 2017-09-14 MED ORDER — ATENOLOL 50 MG PO TABS
50.0000 mg | ORAL_TABLET | Freq: Two times a day (BID) | ORAL | Status: DC
  Filled 2017-09-14 (×2): qty 1

## 2017-09-14 MED ORDER — SUGAMMADEX SODIUM 200 MG/2ML IV SOLN
INTRAVENOUS | Status: AC
  Filled 2017-09-14: qty 2

## 2017-09-14 MED ORDER — HYDROMORPHONE HCL 2 MG/ML IJ SOLN: 1.0000 mg | INTRAMUSCULAR | Status: DC | PRN

## 2017-09-14 MED ORDER — CEFAZOLIN SODIUM-DEXTROSE 2-4 GM/100ML-% IV SOLN
INTRAVENOUS | Status: AC
  Filled 2017-09-14: qty 100

## 2017-09-14 MED ORDER — SENNOSIDES-DOCUSATE SODIUM 8.6-50 MG PO TABS: 1.0000 | ORAL_TABLET | Freq: Every evening | ORAL | Status: DC | PRN

## 2017-09-14 MED ORDER — ENOXAPARIN SODIUM 40 MG/0.4ML ~~LOC~~ SOLN
40.0000 mg | SUBCUTANEOUS | Status: DC
  Administered 2017-09-15: 40 mg via SUBCUTANEOUS
  Filled 2017-09-14: qty 0.4

## 2017-09-14 MED ORDER — VITAMIN C 500 MG PO TABS
500.0000 mg | ORAL_TABLET | Freq: Every day | ORAL | Status: DC
  Administered 2017-09-14 – 2017-09-15 (×2): 500 mg via ORAL
  Filled 2017-09-14 (×2): qty 1

## 2017-09-14 MED ORDER — CEFAZOLIN SODIUM-DEXTROSE 2-4 GM/100ML-% IV SOLN
2.0000 g | INTRAVENOUS | Status: AC
  Administered 2017-09-14: 2 g via INTRAVENOUS

## 2017-09-14 MED ORDER — MEPERIDINE HCL 50 MG/ML IJ SOLN: 6.2500 mg | INTRAMUSCULAR | Status: DC | PRN

## 2017-09-14 MED ORDER — FENTANYL CITRATE (PF) 250 MCG/5ML IJ SOLN
INTRAMUSCULAR | Status: AC
  Filled 2017-09-14: qty 5

## 2017-09-14 MED ORDER — CHLORHEXIDINE GLUCONATE CLOTH 2 % EX PADS: 6.0000 | MEDICATED_PAD | Freq: Once | CUTANEOUS | Status: DC

## 2017-09-14 MED ORDER — SODIUM CHLORIDE 0.9 % IJ SOLN
INTRAMUSCULAR | Status: AC
  Filled 2017-09-14: qty 10

## 2017-09-14 MED ORDER — ACETAMINOPHEN 500 MG PO TABS
ORAL_TABLET | ORAL | Status: AC
  Administered 2017-09-14: 1000 mg
  Filled 2017-09-14: qty 2

## 2017-09-14 MED ORDER — LACTATED RINGERS IV SOLN
INTRAVENOUS | Status: DC | PRN
  Administered 2017-09-14 (×2): via INTRAVENOUS

## 2017-09-14 MED ORDER — LACTATED RINGERS IV SOLN
INTRAVENOUS | Status: DC
  Administered 2017-09-14: 15:00:00 via INTRAVENOUS

## 2017-09-14 MED ORDER — LIDOCAINE 2% (20 MG/ML) 5 ML SYRINGE
INTRAMUSCULAR | Status: AC
  Filled 2017-09-14: qty 15

## 2017-09-14 MED ORDER — HYDROMORPHONE HCL 1 MG/ML IJ SOLN: 1.0000 mg | INTRAMUSCULAR | Status: DC | PRN

## 2017-09-14 MED ORDER — FENTANYL CITRATE (PF) 250 MCG/5ML IJ SOLN
INTRAMUSCULAR | Status: DC | PRN
  Administered 2017-09-14: 50 ug via INTRAVENOUS
  Administered 2017-09-14: 100 ug via INTRAVENOUS
  Administered 2017-09-14: 50 ug via INTRAVENOUS

## 2017-09-14 MED ORDER — ROCURONIUM BROMIDE 50 MG/5ML IV SOLN
INTRAVENOUS | Status: AC
  Filled 2017-09-14: qty 2

## 2017-09-14 MED ORDER — MIDAZOLAM HCL 5 MG/5ML IJ SOLN
INTRAMUSCULAR | Status: DC | PRN
  Administered 2017-09-14: 2 mg via INTRAVENOUS

## 2017-09-14 MED ORDER — LIDOCAINE HCL (CARDIAC) PF 100 MG/5ML IV SOSY
PREFILLED_SYRINGE | INTRAVENOUS | Status: DC | PRN
  Administered 2017-09-14: 100 mg via INTRATRACHEAL

## 2017-09-14 MED ORDER — SODIUM CHLORIDE 0.9 % IJ SOLN
INTRAVENOUS | Status: DC | PRN
  Administered 2017-09-14: 5 mL

## 2017-09-14 MED ORDER — FAMOTIDINE IN NACL 20-0.9 MG/50ML-% IV SOLN
20.0000 mg | Freq: Two times a day (BID) | INTRAVENOUS | Status: DC
  Administered 2017-09-14: 20 mg via INTRAVENOUS
  Filled 2017-09-14 (×3): qty 50

## 2017-09-14 MED ORDER — DEXAMETHASONE SODIUM PHOSPHATE 10 MG/ML IJ SOLN
INTRAMUSCULAR | Status: DC | PRN
  Administered 2017-09-14: 6 mg via INTRAVENOUS

## 2017-09-14 MED ORDER — ONDANSETRON HCL 4 MG/2ML IJ SOLN
INTRAMUSCULAR | Status: DC | PRN
  Administered 2017-09-14: 4 mg via INTRAVENOUS

## 2017-09-14 MED ORDER — ONDANSETRON 4 MG PO TBDP: 4.0000 mg | ORAL_TABLET | Freq: Four times a day (QID) | ORAL | Status: DC | PRN

## 2017-09-14 MED ORDER — HYDROMORPHONE HCL 2 MG/ML IJ SOLN: 0.2500 mg | INTRAMUSCULAR | Status: DC | PRN

## 2017-09-14 MED ORDER — PHENYLEPHRINE HCL 10 MG/ML IJ SOLN
INTRAMUSCULAR | Status: DC | PRN
  Administered 2017-09-14: 80 ug via INTRAVENOUS

## 2017-09-14 MED ORDER — PHENYLEPHRINE 40 MCG/ML (10ML) SYRINGE FOR IV PUSH (FOR BLOOD PRESSURE SUPPORT)
PREFILLED_SYRINGE | INTRAVENOUS | Status: AC
  Filled 2017-09-14: qty 20

## 2017-09-14 MED ORDER — BUPIVACAINE-EPINEPHRINE (PF) 0.5% -1:200000 IJ SOLN
INTRAMUSCULAR | Status: DC | PRN
  Administered 2017-09-14: 30 mL via PERINEURAL

## 2017-09-14 MED ORDER — IBUPROFEN 200 MG PO TABS: 200.0000 mg | ORAL_TABLET | Freq: Four times a day (QID) | ORAL | Status: DC | PRN

## 2017-09-14 MED ORDER — GABAPENTIN 300 MG PO CAPS: 300.0000 mg | ORAL_CAPSULE | ORAL | Status: DC

## 2017-09-14 MED ORDER — MIDAZOLAM HCL 2 MG/2ML IJ SOLN
INTRAMUSCULAR | Status: AC
  Filled 2017-09-14: qty 2

## 2017-09-14 MED ORDER — OMEGA-3-ACID ETHYL ESTERS 1 G PO CAPS
1.0000 g | ORAL_CAPSULE | Freq: Two times a day (BID) | ORAL | Status: DC
  Administered 2017-09-15: 1 g via ORAL
  Filled 2017-09-14 (×2): qty 1

## 2017-09-14 SURGICAL SUPPLY — 54 items
APPLIER CLIP 9.375 MED OPEN (MISCELLANEOUS) ×2
BINDER BREAST LRG (GAUZE/BANDAGES/DRESSINGS) IMPLANT
BINDER BREAST XLRG (GAUZE/BANDAGES/DRESSINGS) ×1 IMPLANT
CANISTER SUCT 3000ML PPV (MISCELLANEOUS) ×2 IMPLANT
CHLORAPREP W/TINT 26ML (MISCELLANEOUS) ×2 IMPLANT
CLIP APPLIE 9.375 MED OPEN (MISCELLANEOUS) ×1 IMPLANT
CONT SPEC 4OZ CLIKSEAL STRL BL (MISCELLANEOUS) ×4 IMPLANT
COVER PROBE W GEL 5X96 (DRAPES) ×2 IMPLANT
COVER SURGICAL LIGHT HANDLE (MISCELLANEOUS) ×2 IMPLANT
DERMABOND ADVANCED (GAUZE/BANDAGES/DRESSINGS) ×3
DERMABOND ADVANCED .7 DNX12 (GAUZE/BANDAGES/DRESSINGS) ×1 IMPLANT
DEVICE DISSECT PLASMABLAD 3.0S (MISCELLANEOUS) IMPLANT
DRAIN CHANNEL 19F RND (DRAIN) ×3 IMPLANT
DRAPE CHEST BREAST 15X10 FENES (DRAPES) ×1 IMPLANT
DRAPE HALF SHEET 40X57 (DRAPES) ×2 IMPLANT
DRAPE ORTHO SPLIT 87X125 STRL (DRAPES) IMPLANT
ELECT BLADE 4.0 EZ CLEAN MEGAD (MISCELLANEOUS) ×2
ELECT CAUTERY BLADE 6.4 (BLADE) ×2 IMPLANT
ELECT REM PT RETURN 9FT ADLT (ELECTROSURGICAL) ×2
ELECTRODE BLDE 4.0 EZ CLN MEGD (MISCELLANEOUS) ×1 IMPLANT
ELECTRODE REM PT RTRN 9FT ADLT (ELECTROSURGICAL) ×1 IMPLANT
EVACUATOR SILICONE 100CC (DRAIN) ×3 IMPLANT
GAUZE SPONGE 4X4 12PLY STRL LF (GAUZE/BANDAGES/DRESSINGS) ×1 IMPLANT
GLOVE EUDERMIC 7 POWDERFREE (GLOVE) ×2 IMPLANT
GOWN STRL REUS W/ TWL LRG LVL3 (GOWN DISPOSABLE) ×1 IMPLANT
GOWN STRL REUS W/ TWL XL LVL3 (GOWN DISPOSABLE) ×1 IMPLANT
GOWN STRL REUS W/TWL LRG LVL3 (GOWN DISPOSABLE) ×2
GOWN STRL REUS W/TWL XL LVL3 (GOWN DISPOSABLE) ×2
ILLUMINATOR WAVEGUIDE N/F (MISCELLANEOUS) IMPLANT
KIT BASIN OR (CUSTOM PROCEDURE TRAY) ×2 IMPLANT
KIT TURNOVER KIT B (KITS) ×2 IMPLANT
LIGHT WAVEGUIDE WIDE FLAT (MISCELLANEOUS) IMPLANT
NDL 18GX1X1/2 (RX/OR ONLY) (NEEDLE) ×1 IMPLANT
NDL FILTER BLUNT 18X1 1/2 (NEEDLE) ×1 IMPLANT
NDL HYPO 25GX1X1/2 BEV (NEEDLE) ×1 IMPLANT
NEEDLE 18GX1X1/2 (RX/OR ONLY) (NEEDLE) ×2 IMPLANT
NEEDLE FILTER BLUNT 18X 1/2SAF (NEEDLE) ×1
NEEDLE FILTER BLUNT 18X1 1/2 (NEEDLE) ×1 IMPLANT
NEEDLE HYPO 25GX1X1/2 BEV (NEEDLE) ×2 IMPLANT
NS IRRIG 1000ML POUR BTL (IV SOLUTION) ×2 IMPLANT
PACK GENERAL/GYN (CUSTOM PROCEDURE TRAY) ×2 IMPLANT
PAD ABD 8X10 STRL (GAUZE/BANDAGES/DRESSINGS) ×2 IMPLANT
PAD ARMBOARD 7.5X6 YLW CONV (MISCELLANEOUS) ×2 IMPLANT
PLASMABLADE 3.0S (MISCELLANEOUS)
SPECIMEN JAR X LARGE (MISCELLANEOUS) ×2 IMPLANT
SUT ETHILON 3 0 FSL (SUTURE) ×4 IMPLANT
SUT MNCRL AB 4-0 PS2 18 (SUTURE) ×3 IMPLANT
SUT SILK 2 0 PERMA HAND 18 BK (SUTURE) ×2 IMPLANT
SUT VIC AB 3-0 SH 18 (SUTURE) ×3 IMPLANT
SUT VICRYL AB 2 0 TIES (SUTURE) ×1 IMPLANT
SYR CONTROL 10ML LL (SYRINGE) ×2 IMPLANT
TOWEL OR 17X24 6PK STRL BLUE (TOWEL DISPOSABLE) ×2 IMPLANT
TOWEL OR 17X26 10 PK STRL BLUE (TOWEL DISPOSABLE) ×2 IMPLANT
TUBE CONNECTING 12X1/4 (SUCTIONS) IMPLANT

## 2017-09-14 NOTE — Interval H&P Note (Signed)
History and Physical Interval Note:  09/14/2017 5:43 AM  Robin Estrada  has presented today for surgery, with the diagnosis of LEFT BREAST CANCER  The various methods of treatment have been discussed with the patient and family. After consideration of risks, benefits and other options for treatment, the patient has consented to  Procedure(s): LEFT TOTAL MASTECTOMY WITH SENTINEL LYMPH NODE BIOPSY (Left) as a surgical intervention .  The patient's history has been reviewed, patient examined, no change in status, stable for surgery.  I have reviewed the patient's chart and labs.  Questions were answered to the patient's satisfaction.     Adin Hector

## 2017-09-14 NOTE — Op Note (Addendum)
Patient Name:           Robin Estrada   Date of Surgery:        09/14/2017  Pre op Diagnosis:      Cancer left breast  Post op Diagnosis:    Same  Procedure:                 Inject blue dye left breast                                      Left total mastectomy                                      Left axillary deep sentinel lymph node biopsy   Surgeon:                     Edsel Petrin. Dalbert Batman, M.D., FACS  Assistant:                      Carlena Hurl, PA   Indication for Assistant: Large breast tumor requiring assistant for exposure and hemostasis.  Operative Indications:   This is a 72 year old female. 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 She has a  malignancy involving all 4 quadrants of her left breast and is scheduled for left mastectomy and sentinel node biopsy The mass has grown in the last 2 months. It is still mobile and not fixed to the chest wall. I saw her on August 27, 2017 and she has some erythema around the incision but no abscess. We put her on Bactrim and that made her nauseated and she's been taking amoxicillin which she says she has at home but is running out. 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  Dr. Lyndon Code says this is an unusual  malignancy and must be removed and the consensus in breast conference was the same. She was resistant to surgery at first and slow to make decisions, but now is ready to go. They have a printed copy of the pathology report.        I previously referred her to medical oncology and radiation oncology, and she canceled those appointments We going to keep her on the OR schedule for left mastectomy with sentinel node biopsy  I have discussed the indications, techniques, and risk of the surgery in detail on several occasions.    Operative Findings:       She had a large, 20  cm or greater multilobulated partially cystic mass involving  all 4 quadrants of the left breast but this was quite mobile and did not involve the pectoralis muscle.  We were able to get a clean resection with primary closure.  I found 3 sentinel lymph nodes that were very hot and slightly blue but they were very soft and were not pathologically enlarged.  I felt that there was more bleeding than usual due to neovascularity of the tissues.  Procedure in Detail:          Following the induction of general LMA anesthesia a surgical timeout was performed.  Intravenous antibiotics were given.  Following alcohol prep I injected 5 cc of dilute methylene blue  into the left breast laterally and massaged the breast for a few minutes.    Left pectoral block and technetium radionuclide had been injected in the holding area    The entire anterolateral chest wall and axilla were then prepped and draped in a sterile fashion.  I spent some time with a marking pen planning the incision.  This was generally a transverse elliptical incision.  The incision was made with a knife.  Skin flaps were raised medially to the parasternal area, superiorly to the infraclavicular area, laterally to the anterior border of the latissimus dorsi muscle, and inferiorly to just below the inframammary crease.  Using the neoprobe I dissected up into the axilla and found 3 sentinel lymph nodes which were sent as separate specimens.  The breast was dissected off of the pectoralis major and minor muscles with electrocautery.  The lateral skin margin was marked with a silk suture.  I divided the axillary contents between Kelly clamps and ligated this with 2-0 chromic ties.  The specimen was passed off.  Hemostasis was excellent and achieved with electrocautery, Vicryl ties, and a clip applier.  The wound was copiously irrigated.  Hemostasis was meticulously obtained.  Once I was sure that the wound was completely dry up I placed two 19 French Blake  drains, one across the skin flaps and one up toward the axilla.  These were brought out through separate stab incisions inferolaterally, sutured to the skin with nylon sutures and connected to suction bulbs.     The mastectomy incision was closed in 2 layers, subcutaneous tissue with interrupted 3-0 Vicryl and skin with running subcuticular 4-0 Monocryl and Dermabond.  Clean bandages and a breast binder were placed.  The patient the patient tolerated the procedure well and was taken to PACU in stable condition.  EBL 100-150 cc.  Counts correct.  Complications none.    Addendum: I logged onto the [employer] and reviewed her prescription medication history     Aleisha Paone M. Dalbert Batman, M.D., FACS General and Minimally Invasive Surgery Breast and Colorectal Surgery  09/14/2017 9:47 AM

## 2017-09-14 NOTE — Anesthesia Preprocedure Evaluation (Signed)
Anesthesia Evaluation  Patient identified by MRN, date of birth, ID band Patient awake    Reviewed: Allergy & Precautions, NPO status , Patient's Chart, lab work & pertinent test results  Airway Mallampati: I  TM Distance: >3 FB Neck ROM: Full    Dental   Pulmonary    Pulmonary exam normal        Cardiovascular hypertension, Pt. on medications Normal cardiovascular exam     Neuro/Psych Dementia    GI/Hepatic   Endo/Other    Renal/GU      Musculoskeletal   Abdominal   Peds  Hematology   Anesthesia Other Findings   Reproductive/Obstetrics                             Anesthesia Physical Anesthesia Plan  ASA: II  Anesthesia Plan: General   Post-op Pain Management:  Regional for Post-op pain   Induction: Intravenous  PONV Risk Score and Plan: 3 and Ondansetron and Midazolam  Airway Management Planned: Oral ETT  Additional Equipment:   Intra-op Plan:   Post-operative Plan: Extubation in OR  Informed Consent: I have reviewed the patients History and Physical, chart, labs and discussed the procedure including the risks, benefits and alternatives for the proposed anesthesia with the patient or authorized representative who has indicated his/her understanding and acceptance.     Plan Discussed with: CRNA and Surgeon  Anesthesia Plan Comments:         Anesthesia Quick Evaluation

## 2017-09-14 NOTE — Anesthesia Postprocedure Evaluation (Signed)
Anesthesia Post Note  Patient: Robin Estrada  Procedure(s) Performed: LEFT TOTAL MASTECTOMY WITH SENTINEL LYMPH NODE BIOPSY (Left Breast)     Patient location during evaluation: PACU Anesthesia Type: General Level of consciousness: awake and alert Pain management: pain level controlled Vital Signs Assessment: post-procedure vital signs reviewed and stable Respiratory status: spontaneous breathing, nonlabored ventilation, respiratory function stable and patient connected to nasal cannula oxygen Cardiovascular status: blood pressure returned to baseline and stable Postop Assessment: no apparent nausea or vomiting Anesthetic complications: no    Last Vitals:  Vitals:   09/14/17 1055 09/14/17 1154  BP: (!) 158/76 (!) 162/75  Pulse: 65 66  Resp: 15   Temp: (!) 36.4 C   SpO2: 98% 97%    Last Pain:  Vitals:   09/14/17 1055  TempSrc:   PainSc: 0-No pain                 Calina Patrie DAVID

## 2017-09-14 NOTE — Anesthesia Procedure Notes (Signed)
Procedure Name: LMA Insertion Date/Time: 09/14/2017 7:45 AM Performed by: Mariea Clonts, CRNA Pre-anesthesia Checklist: Patient identified, Emergency Drugs available, Suction available and Patient being monitored Patient Re-evaluated:Patient Re-evaluated prior to induction Oxygen Delivery Method: Circle System Utilized Preoxygenation: Pre-oxygenation with 100% oxygen Induction Type: IV induction Ventilation: Mask ventilation without difficulty LMA: LMA inserted LMA Size: 4.0 Number of attempts: 1 Airway Equipment and Method: Bite block Placement Confirmation: positive ETCO2 Tube secured with: Tape Dental Injury: Teeth and Oropharynx as per pre-operative assessment

## 2017-09-14 NOTE — Transfer of Care (Signed)
Immediate Anesthesia Transfer of Care Note  Patient: Robin Estrada  Procedure(s) Performed: LEFT TOTAL MASTECTOMY WITH SENTINEL LYMPH NODE BIOPSY (Left Breast)  Patient Location: PACU  Anesthesia Type:GA combined with regional for post-op pain  Level of Consciousness: awake, alert  and oriented  Airway & Oxygen Therapy: Patient Spontanous Breathing and Patient connected to nasal cannula oxygen  Post-op Assessment: Report given to RN and Post -op Vital signs reviewed and stable  Post vital signs: Reviewed and stable  Last Vitals:  Vitals Value Taken Time  BP 152/77 09/14/2017  9:56 AM  Temp    Pulse 71 09/14/2017  9:58 AM  Resp 13 09/14/2017  9:58 AM  SpO2 100 % 09/14/2017  9:58 AM  Vitals shown include unvalidated device data.  Last Pain:  Vitals:   09/14/17 0556  TempSrc: Oral      Patients Stated Pain Goal: 2 (95/09/32 6712)  Complications: No apparent anesthesia complications

## 2017-09-14 NOTE — Anesthesia Procedure Notes (Signed)
Anesthesia Regional Block: Pectoralis block   Pre-Anesthetic Checklist: ,, timeout performed, Correct Patient, Correct Site, Correct Laterality, Correct Procedure, Correct Position, site marked, Risks and benefits discussed,  Surgical consent,  Pre-op evaluation,  At surgeon's request and post-op pain management  Laterality: Left  Prep: chloraprep       Needles:  Injection technique: Single-shot     Needle Length: 9cm  Needle Gauge: 21     Additional Needles:   Narrative:  Start time: 09/14/2017 7:10 AM End time: 09/14/2017 7:20 AM Injection made incrementally with aspirations every 5 mL.  Performed by: Personally  Anesthesiologist: Lillia Abed, MD  Additional Notes: Monitors applied. Patient sedated. Sterile prep and drape,hand hygiene and sterile gloves were used. Relevant anatomy identified.Needle position confirmed.Local anesthetic injected incrementally after negative aspiration. Local anesthetic spread visualized. Vascular puncture avoided. No complications. Image printed for medical record.The patient tolerated the procedure well.

## 2017-09-15 ENCOUNTER — Encounter (HOSPITAL_COMMUNITY): Payer: Self-pay | Admitting: General Surgery

## 2017-09-15 DIAGNOSIS — Z803 Family history of malignant neoplasm of breast: Secondary | ICD-10-CM | POA: Diagnosis not present

## 2017-09-15 DIAGNOSIS — Z79899 Other long term (current) drug therapy: Secondary | ICD-10-CM | POA: Diagnosis not present

## 2017-09-15 DIAGNOSIS — I10 Essential (primary) hypertension: Secondary | ICD-10-CM | POA: Diagnosis not present

## 2017-09-15 DIAGNOSIS — R69 Illness, unspecified: Secondary | ICD-10-CM | POA: Diagnosis not present

## 2017-09-15 DIAGNOSIS — N61 Mastitis without abscess: Secondary | ICD-10-CM | POA: Diagnosis not present

## 2017-09-15 DIAGNOSIS — C50812 Malignant neoplasm of overlapping sites of left female breast: Secondary | ICD-10-CM | POA: Diagnosis not present

## 2017-09-15 MED ORDER — HYDROCODONE-ACETAMINOPHEN 5-325 MG PO TABS: 1.0000 | ORAL_TABLET | Freq: Four times a day (QID) | ORAL | 0 refills | Status: DC | PRN

## 2017-09-15 NOTE — Discharge Instructions (Signed)
[facility] °[phone] ° °MASTECTOMY: POST OP INSTRUCTIONS ° °Always review your discharge instruction sheet given to you by the facility where your surgery was performed. °IF YOU HAVE DISABILITY OR FAMILY LEAVE FORMS, YOU MUST BRING THEM TO THE OFFICE FOR PROCESSING.   °DO NOT GIVE THEM TO YOUR DOCTOR. °A prescription for pain medication may be given to you upon discharge.  Take your pain medication as prescribed, if needed.  If narcotic pain medicine is not needed, then you may take acetaminophen (Tylenol) or ibuprofen (Advil) as needed. °1. Take your usually prescribed medications unless otherwise directed. °2. If you need a refill on your pain medication, please contact your pharmacy.  They will contact our office to request authorization.  Prescriptions will not be filled after 5pm or on week-ends. °3. You should follow a light diet the first few days after arrival home, such as soup and crackers, etc.  Resume your normal diet the day after surgery. °4. Most patients will experience some swelling and bruising on the chest and underarm.  Ice packs will help.  Swelling and bruising can take several days to resolve.  °5. It is common to experience some constipation if taking pain medication after surgery.  Increasing fluid intake and taking a stool softener (such as Colace) will usually help or prevent this problem from occurring.  A mild laxative (Milk of Magnesia or Miralax) should be taken according to package instructions if there are no bowel movements after 48 hours. °6. Unless discharge instructions indicate otherwise, leave your bandage dry and in place until your next appointment in 3-5 days.  You may take a limited sponge bath.  No tube baths or showers until the drains are removed.  You may have steri-strips (small skin tapes) in place directly over the incision.  These strips should be left on the skin for 7-10 days.  If your surgeon used skin glue on the incision, you may  shower in 24 hours.  The glue will flake off over the next 2-3 weeks.  Any sutures or staples will be removed at the office during your follow-up visit. °7. DRAINS:  If you have drains in place, it is important to keep a list of the amount of drainage produced each day in your drains.  Before leaving the hospital, you should be instructed on drain care.  Call our office if you have any questions about your drains. °8. ACTIVITIES:  You may resume regular (light) daily activities beginning the next day--such as daily self-care, walking, climbing stairs--gradually increasing activities as tolerated.  You may have sexual intercourse when it is comfortable.  Refrain from any heavy lifting or straining until approved by your doctor. °a. You may drive when you are no longer taking prescription pain medication, you can comfortably wear a seatbelt, and you can safely maneuver your car and apply brakes. °b. RETURN TO WORK:  __________________________________________________________ °9. You should see your doctor in the office for a follow-up appointment approximately 3-5 days after your surgery.  Your doctor’s nurse will typically make your follow-up appointment when she calls you with your pathology report.  Expect your pathology report 2-3 business days after your surgery.  You may call to check if you do not hear from us after three days.   °10. OTHER INSTRUCTIONS: ______________________________________________________________________________________________ ____________________________________________________________________________________________ °WHEN TO CALL YOUR DOCTOR: °1. Fever over 101.0 °2. Nausea and/or vomiting °3. Extreme swelling or bruising °4. Continued bleeding from incision. °5. Increased pain, redness, or drainage from the incision. °  The clinic staff is available to answer your questions during regular business hours.  Please don’t hesitate to call and ask to speak to one of the nurses for clinical  concerns.  If you have a medical emergency, go to the nearest emergency room or call 911.  A surgeon from [facility] is always on call at the hospital. °[address], [address], [place], [place]  [address] ? [address] [address], [place], [place]   [address] °[phone] ? [phone] ? FAX [phone] °Web site: [email or web address] °

## 2017-09-15 NOTE — Discharge Summary (Signed)
Patient ID: Robin Estrada [id number] 72 y.o. 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  Image guided biopsy showed atypical ductal hyperplasia.  After numerous discussions we proceeded with open incisional biopsy which revealed mostly solid but some cystic component.  Pathology was ambiguous but clearly malignant.,  [facility] with malignant spindle cell component and high grade DCIS". 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  Dr. Lyndon Code says this is an unusual  malignancy and must be removed and the consensus in breast conference was the same. She was resistant to surgery at first and slow to make decisions, but now is ready to go. They have a printed copy of the pathology report. I previously referred her to medical oncology and radiation oncology, and she canceled those appointments We going to keep her on the OR schedule for left mastectomy with sentinel node biopsy  I have discussed the indications, techniques, and risk of the surgery in detail on several occasions.    Hospital Course: On the day  of admission the patient was taken to the operating room and underwent injection of blue dye left breast, left total mastectomy, left axillary deep sentinel lymph node biopsy.  The tumor was large but mobile and had not invaded the chest wall at all.  Skin edges seemed clean and free.  There was no evidence of infection.  Primary closure was obtained.  Lymph node mapping went well and the lymph nodes did not appear pathologic. She was admitted overnight for observation and did well.  She was given a postop doses of antibiotics .  on postop day 1 she was comfortable and alert and asking good questions.  Pain control was good.  No arm swelling or arm numbness.  The left mastectomy incision looked good.  It was flat without hematoma or seroma.  Both drains were functioning with thin serosanguineous drainage.  She wanted to go home and she was discharged home She was discharged on postop day 1.  She was given a prescription for Norco for pain.  She was told to continue her usual medications.  She was told that she did not need any antibiotics. Diet and activities were discussed.  She was asked to make an appointment see me in 10 to 14 days.  Shoulder range of motion exercises were encouraged.  Wound  care was detailed.  Consults: None  Significant Diagnostic Studies: Surgical pathology  Treatments: surgery: Inject blue dye left breast, left total mastectomy, left axillary deep sentinel lymph node biopsy  Disposition: Home  Patient Instructions:  Allergies as of 09/15/2017      Reactions   Statins Nausea And Vomiting, Other (See Comments)   MUSCLE PAIN   Sulfa Antibiotics Nausea Only   Sulfamethoxazole-trimethoprim Nausea Only   " Severe Nausea "      Medication List    TAKE these medications   acetaminophen 500 MG tablet Commonly known as:  TYLENOL Take 1,000 mg by mouth every 6 (six) hours as needed for headache.   aspirin 81 MG tablet Take 81 mg by mouth daily.   atenolol 50 MG  tablet Commonly known as:  TENORMIN Take 1 tablet (50 mg total) by mouth 2 (two) times daily.   butalbital-acetaminophen-caffeine 50-325-40 MG tablet Commonly known as:  FIORICET Take 1-2 tablets by mouth 2 (two) times daily as needed for headache.   cholecalciferol 1000 units tablet Commonly known as:  VITAMIN D Take 1 tablet (1,000 Units total) by mouth daily.   HYDROcodone-acetaminophen 5-325 MG tablet Commonly known as:  NORCO Take 1-2 tablets by mouth every 6 (six) hours as needed for moderate pain or severe pain. What changed:  Another medication with the same name was added. Make sure you understand how and when to take each.   HYDROcodone-acetaminophen 5-325 MG tablet Commonly known as:  NORCO Take 1-2 tablets by mouth every 6 (six) hours as needed for moderate pain or severe pain. What changed:  You were already taking a medication with the same name, and this prescription was added. Make sure you understand how and when to take each.   ibuprofen 200 MG tablet Commonly known as:  ADVIL,MOTRIN Take 200 mg by mouth every 6 (six) hours as needed for headache.   Icosapent Ethyl 1 g Caps Commonly known as:  VASCEPA Take 2 capsules by mouth 2 (two) times daily.   vitamin C 500 MG tablet Commonly known as:  ASCORBIC ACID Take 500 mg by mouth daily.            Discharge Care Instructions  (From admission, onward)        Start     Ordered   09/15/17 0000  Discharge wound care:    Comments:  Keep the wound clean and dry  No antibiotics will be necessary  Change the dressing if it becomes wet or soiled  Place a new dressing after you take a shower  Keep a written record of the drainage   09/15/17 0756      Activity: Frequent ambulation.  No driving.  No heavy lifting.  Shoulder range of motion exercises encouraged Diet: low fat, low cholesterol diet Wound Care: as directed  Follow-up:  With Dr. Dalbert Batman in 10-14 days.  Signed: Edsel Petrin. Dalbert Batman, M.D.,  FACS General and minimally invasive surgery Breast and Colorectal Surgery  09/15/2017, 7:57 AM

## 2017-09-15 NOTE — Progress Notes (Signed)
Patient discharged to home with instructions and return demonstration done.

## 2017-09-16 ENCOUNTER — Ambulatory Visit: Payer: Medicare HMO | Admitting: Oncology

## 2017-09-17 ENCOUNTER — Ambulatory Visit: Payer: Medicare HMO | Admitting: Radiation Oncology

## 2017-09-17 ENCOUNTER — Ambulatory Visit: Payer: Medicare HMO

## 2017-09-22 NOTE — Progress Notes (Signed)
Inform patient of Pathology report,. You may have to speak to her son due to [place] language barrier. Her breast cancer is completely removed. Margins are negative. Lymph nodes are negative. No parasites were seen ( request of patient) I will discuss in detail at next office visit  Needs medical oncology referral.(previously cancelled by patient)  Let me know you reached her.  hmi
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
## 2017-09-28 ENCOUNTER — Inpatient Hospital Stay (HOSPITAL_BASED_OUTPATIENT_CLINIC_OR_DEPARTMENT_OTHER): Payer: Medicare HMO | Admitting: Oncology

## 2017-09-28 ENCOUNTER — Encounter: Payer: Self-pay | Admitting: Oncology

## 2017-09-28 DIAGNOSIS — C50812 Malignant neoplasm of overlapping sites of left female breast: Secondary | ICD-10-CM

## 2017-09-28 DIAGNOSIS — Z171 Estrogen receptor negative status [ER-]: Secondary | ICD-10-CM

## 2017-09-29 ENCOUNTER — Telehealth: Payer: Self-pay | Admitting: Oncology

## 2017-09-29 NOTE — Telephone Encounter (Signed)
Son called to make sure appt was cancelled from 5/20 for his mom with Dr. Jana Hakim. Will reschedule once seen by Dr. Dalbert Batman per the son.

## 2017-09-29 NOTE — Telephone Encounter (Signed)
No entry 

## 2017-10-19 ENCOUNTER — Telehealth: Payer: Self-pay | Admitting: Oncology

## 2017-10-19 NOTE — Telephone Encounter (Signed)
New referral received from Dr. Dalbert Batman from [facility] for dx of breast cancer. Spoke to the pt's son, Jonne Ply, and scheduled the pt to see Dr. Jana Hakim on 6/24 at [facility] to arrive 30 minutes early. Letter mailed.

## 2017-10-30 ENCOUNTER — Other Ambulatory Visit: Payer: Self-pay | Admitting: *Deleted

## 2017-10-30 DIAGNOSIS — C50812 Malignant neoplasm of overlapping sites of left female breast: Secondary | ICD-10-CM

## 2017-10-30 DIAGNOSIS — Z171 Estrogen receptor negative status [ER-]: Principal | ICD-10-CM

## 2017-11-02 ENCOUNTER — Inpatient Hospital Stay: Payer: Medicare HMO | Attending: Oncology | Admitting: Oncology

## 2017-11-02 ENCOUNTER — Inpatient Hospital Stay: Payer: Medicare HMO

## 2017-11-02 VITALS — BP 159/74 | HR 72 | Temp 98.4°F | Resp 18 | Ht 60.0 in | Wt 167.1 lb

## 2017-11-02 DIAGNOSIS — C50812 Malignant neoplasm of overlapping sites of left female breast: Secondary | ICD-10-CM | POA: Diagnosis not present

## 2017-11-02 DIAGNOSIS — Z9012 Acquired absence of left breast and nipple: Secondary | ICD-10-CM

## 2017-11-02 DIAGNOSIS — Z171 Estrogen receptor negative status [ER-]: Secondary | ICD-10-CM | POA: Insufficient documentation

## 2017-11-02 DIAGNOSIS — I10 Essential (primary) hypertension: Secondary | ICD-10-CM | POA: Diagnosis not present

## 2017-11-02 DIAGNOSIS — Z853 Personal history of malignant neoplasm of breast: Secondary | ICD-10-CM | POA: Insufficient documentation
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
## 2017-11-03 ENCOUNTER — Telehealth: Payer: Self-pay | Admitting: Oncology

## 2017-11-03 NOTE — Telephone Encounter (Signed)
Per 6/24 no los

## 2017-11-04 ENCOUNTER — Encounter: Payer: Self-pay | Admitting: Radiation Oncology

## 2017-11-05 ENCOUNTER — Other Ambulatory Visit: Payer: Self-pay | Admitting: *Deleted

## 2017-11-06 ENCOUNTER — Ambulatory Visit (HOSPITAL_COMMUNITY): Admission: RE | Admit: 2017-11-06 | Payer: Medicare HMO | Source: Ambulatory Visit

## 2017-11-06 ENCOUNTER — Ambulatory Visit (HOSPITAL_COMMUNITY): Payer: Medicare HMO

## 2017-11-06 ENCOUNTER — Ambulatory Visit: Payer: Medicare HMO

## 2017-11-09 ENCOUNTER — Encounter (HOSPITAL_COMMUNITY): Payer: Self-pay

## 2017-11-09 ENCOUNTER — Ambulatory Visit (HOSPITAL_COMMUNITY)
Admission: RE | Admit: 2017-11-09 | Discharge: 2017-11-09 | Disposition: A | Discharge status: Home / Self Care | Payer: Medicare HMO | Source: Ambulatory Visit | Attending: Oncology | Admitting: Oncology

## 2017-11-09 ENCOUNTER — Inpatient Hospital Stay: Payer: Medicare HMO | Attending: Oncology

## 2017-11-09 DIAGNOSIS — Z853 Personal history of malignant neoplasm of breast: Secondary | ICD-10-CM

## 2017-11-09 DIAGNOSIS — C50812 Malignant neoplasm of overlapping sites of left female breast: Secondary | ICD-10-CM

## 2017-11-09 DIAGNOSIS — Z171 Estrogen receptor negative status [ER-]: Principal | ICD-10-CM

## 2017-11-09 LAB — CMP (CANCER CENTER ONLY)
ALBUMIN: 4.3 g/dL (ref 3.5–5.0)
ALT: 34 U/L (ref 0–44)
AST: 23 U/L (ref 15–41)
Alkaline Phosphatase: 100 U/L (ref 38–126)
Anion gap: 8 (ref 5–15)
BUN: 17 mg/dL (ref 8–23)
CHLORIDE: 104 mmol/L (ref 98–111)
CO2: 29 mmol/L (ref 22–32)
CREATININE: 0.86 mg/dL (ref 0.44–1.00)
Calcium: 9.9 mg/dL (ref 8.9–10.3)
GFR, Estimated: >60 mL/min (ref >60)
GLUCOSE: 112 mg/dL — AB (ref 70–99)
Potassium: 4.5 mmol/L (ref 3.5–5.1)
SODIUM: 141 mmol/L (ref 135–145)
Total Bilirubin: 0.4 mg/dL (ref 0.3–1.2)
Total Protein: 7.2 g/dL (ref 6.5–8.1)

## 2017-11-09 LAB — CBC WITH DIFFERENTIAL (CANCER CENTER ONLY)
Basophils Absolute: 0 10*3/uL (ref 0.0–0.1)
Basophils Relative: 1 %
EOS ABS: 0.2 10*3/uL (ref 0.0–0.5)
Eosinophils Relative: 4 %
HEMATOCRIT: 39.6 % (ref 34.8–46.6)
HEMOGLOBIN: 12.3 g/dL (ref 11.6–15.9)
LYMPHS ABS: 1.5 10*3/uL (ref 0.9–3.3)
Lymphocytes Relative: 24 %
MCH: 27.6 pg (ref 25.1–34.0)
MCHC: 31.1 g/dL — ABNORMAL LOW (ref 31.5–36.0)
MCV: 89 fL (ref 79.5–101.0)
MONO ABS: 0.5 10*3/uL (ref 0.1–0.9)
MONOS PCT: 7 %
NEUTROS PCT: 64 %
Neutro Abs: 3.9 10*3/uL (ref 1.5–6.5)
Platelet Count: 269 10*3/uL (ref 145–400)
RBC: 4.45 MIL/uL (ref 3.70–5.45)
RDW: 14.4 % (ref 11.2–14.5)
WBC Count: 6.1 10*3/uL (ref 3.9–10.3)

## 2017-11-10 ENCOUNTER — Other Ambulatory Visit: Payer: Self-pay

## 2017-11-10 ENCOUNTER — Ambulatory Visit (HOSPITAL_COMMUNITY)
Admission: RE | Admit: 2017-11-10 | Discharge: 2017-11-10 | Disposition: A | Discharge status: Home / Self Care | Payer: Medicare HMO | Source: Ambulatory Visit | Attending: Oncology | Admitting: Oncology

## 2017-11-10 ENCOUNTER — Ambulatory Visit
Admission: RE | Admit: 2017-11-10 | Discharge: 2017-11-10 | Disposition: A | Discharge status: Home / Self Care | Payer: Medicare HMO | Source: Ambulatory Visit | Attending: Radiation Oncology | Admitting: Radiation Oncology

## 2017-11-10 ENCOUNTER — Encounter: Payer: Self-pay | Admitting: Radiation Oncology

## 2017-11-10 ENCOUNTER — Other Ambulatory Visit: Payer: Self-pay | Admitting: Oncology

## 2017-11-10 VITALS — BP 174/69 | HR 65 | Temp 98.0°F | Resp 16 | Ht 60.0 in | Wt 165.2 lb

## 2017-11-10 VITALS — BP 174/69 | HR 65 | Temp 98.0°F | Resp 16 | Wt 165.2 lb

## 2017-11-10 DIAGNOSIS — C50912 Malignant neoplasm of unspecified site of left female breast: Secondary | ICD-10-CM | POA: Diagnosis not present

## 2017-11-10 DIAGNOSIS — Z9010 Acquired absence of unspecified breast and nipple: Secondary | ICD-10-CM | POA: Diagnosis not present

## 2017-11-10 DIAGNOSIS — Z171 Estrogen receptor negative status [ER-]: Principal | ICD-10-CM

## 2017-11-10 DIAGNOSIS — Z853 Personal history of malignant neoplasm of breast: Secondary | ICD-10-CM | POA: Insufficient documentation

## 2017-11-10 DIAGNOSIS — C50812 Malignant neoplasm of overlapping sites of left female breast: Secondary | ICD-10-CM | POA: Insufficient documentation

## 2017-11-10 DIAGNOSIS — Z803 Family history of malignant neoplasm of breast: Secondary | ICD-10-CM | POA: Diagnosis not present

## 2017-11-10 DIAGNOSIS — Z9012 Acquired absence of left breast and nipple: Secondary | ICD-10-CM | POA: Insufficient documentation

## 2017-11-10 MED ORDER — IOHEXOL 300 MG/ML  SOLN
75.0000 mL | Freq: Once | INTRAMUSCULAR | Status: AC | PRN
  Administered 2017-11-10: 75 mL via INTRAVENOUS

## 2017-11-10 NOTE — Progress Notes (Signed)
Radiation Oncology         [phone] ________________________________  Name: Tayllor Breitenstein        MRN: [id number]  Date of Service: 11/10/2017 DOB: [birth year]  TD:DUKGURKYH, Evie Lacks, MD  Magrinat, Virgie Dad, MD     REFERRING PHYSICIAN: Magrinat, Virgie Dad, MD   DIAGNOSIS: The encounter diagnosis was Malignant neoplasm of overlapping sites of left breast in female, estrogen receptor negative ([place]).   HISTORY OF PRESENT ILLNESS: Ladona Rosten is a 72 y.o. female seen at the request of Dr. Jana Hakim for a recent diagnosis of left breast cancer.  She had a left breast that she had noted for several months prior to undergoing diagnostic mammogram in February 2019 revealing a large 13 cm lobulated mass in the left breast occupying all quadrants with focal skin thickening.  Ultrasound measured this at 14.8 x 6.8 cm with multiple fluid collections within it and floating internal debris and the left axilla showed mildly suspicious node with focal cortical thickening measuring 5 mm.  On the right, there were also subcentimeter masses consistent with benign oil cysts, and the right axilla a lobulated mass corresponding to concern for degenerative fibroadenoma.  A biopsy on 06/17/2017 revealed inflamed cyst with atypical ductal hyperplasia fibrotic and focally atypical stroma sampled nodes from the left axilla was negative for malignant.  She underwent an excisional biopsy on 08/14/2017 revealing biphasic neoplasm with malignant spindle cell component and high-grade DCIS.  The slides were reviewed at [facility] and extensive immunohistochemistry was performed and the results were suspicious for phyllodes tumor and malignant appearance of this as well as malignant spindle cell component and areas of squamous differentiation and high grade DCIS.  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  Her tumor was ER/PR negative and HER-2/neu was negative.  Her case has been discussed in conference on several occasions, and because of the size, triple negative component and tumor type, she is coming to discuss options of radiotherapy.  Dr. Jana Hakim does not recommend chemotherapy at this time.    PREVIOUS RADIATION THERAPY: No   PAST MEDICAL HISTORY:  Past Medical History:  Diagnosis Date  . Breast cancer, left ([place])   . Breast mass, left    Large  . Cancer ([place])    FIBROMYOMA  AGE 53-   BENIGN  RESOLVED  . Cancer of overlapping sites of left female breast ([place]) 09/14/2017  . Headache   . Heart murmur   . Hypertension   . Mitral valve prolapse   . Tuberculosis    AS CHILD  W/ TX       PAST SURGICAL HISTORY: Past Surgical History:  Procedure Laterality Date  . MASS EXCISION Left 08/14/2017   Procedure: PARTIAL EXCISION  LEFT BREAST  MASS ERAS PATHWAY;  Surgeon: Fanny Skates, MD;  Location: [facility];  Service: General;  Laterality: Left;  Marland Kitchen MASTECTOMY W/ SENTINEL NODE BIOPSY Left 09/14/2017   Procedure: LEFT TOTAL MASTECTOMY WITH SENTINEL LYMPH NODE BIOPSY;  Surgeon: Fanny Skates, MD;  Location: [facility];  Service: General;  Laterality: Left;  . NO PAST SURGERIES       FAMILY HISTORY:  Family History  Problem Relation Age of Onset  . Stroke Mother   . Stroke Maternal Grandmother   . Breast cancer  Sister      SOCIAL HISTORY:  reports that she has never smoked. She has never used smokeless tobacco. She reports that she drinks alcohol. She reports that she does not use drugs.   ALLERGIES: Hydrocodone-acetaminophen; Statins; Sulfa antibiotics; and Sulfamethoxazole-trimethoprim   MEDICATIONS:  Current Outpatient Medications  Medication Sig Dispense Refill  . acetaminophen (TYLENOL) 500 MG tablet Take 1,000 mg by mouth  every 6 (six) hours as needed for headache.    Marland Kitchen atenolol (TENORMIN) 50 MG tablet Take 1 tablet (50 mg total) by mouth 2 (two) times daily. 180 tablet 3  . cholecalciferol (VITAMIN D) 1000 units tablet Take 1 tablet (1,000 Units total) by mouth daily. 100 tablet 3  . ibuprofen (ADVIL,MOTRIN) 200 MG tablet Take 200 mg by mouth every 6 (six) hours as needed for headache.    . vitamin C (ASCORBIC ACID) 500 MG tablet Take 500 mg by mouth daily.    Marland Kitchen aspirin 81 MG tablet Take 81 mg by mouth daily.    . butalbital-acetaminophen-caffeine (FIORICET) 50-325-40 MG tablet Take 1-2 tablets by mouth 2 (two) times daily as needed for headache. (Patient not taking: Reported on 11/10/2017) 60 tablet 1   No current facility-administered medications for this encounter.      REVIEW OF SYSTEMS: On review of systems, the patient denies any concerns with her chest wall at this time.  No other complaints are verbalized.    PHYSICAL EXAM:  Wt Readings from Last 3 Encounters:  11/10/17 165 lb 3.2 oz (74.9 kg)  11/10/17 165 lb 3.2 oz (74.9 kg)  11/02/17 167 lb 1.6 oz (75.8 kg)   Temp Readings from Last 3 Encounters:  11/10/17 98 F (36.7 C) (Oral)  11/10/17 98 F (36.7 C)  11/02/17 98.4 F (36.9 C) (Oral)   BP Readings from Last 3 Encounters:  11/10/17 (!) 174/69  11/10/17 (!) 174/69  11/02/17 (!) 159/74   Pulse Readings from Last 3 Encounters:  11/10/17 65  11/10/17 65  11/02/17 72   Pain Assessment Pain Score: 0-No pain/10  In general this is a well appearing Caucasian [place] European female in no acute distress.  She is alert and oriented x4 and appropriate throughout the examination. Skin of her face neck and arms is intact without any evidence of gross lesions. Cardiopulmonary assessment is negative for acute distress and she exhibits normal effort.     ECOG = 0  0 - Asymptomatic (Fully active, able to carry on all predisease activities without restriction)  1 - Symptomatic but completely  ambulatory (Restricted in physically strenuous activity but ambulatory and able to carry out work of a light or sedentary nature. For example, light housework, office work)  2 - Symptomatic, <50% in bed during the day (Ambulatory and capable of all self care but unable to carry out any work activities. Up and about more than 50% of waking hours)  3 - Symptomatic, >50% in bed, but not bedbound (Capable of only limited self-care, confined to bed or chair 50% or more of waking hours)  4 - Bedbound (Completely disabled. Cannot carry on any self-care. Totally confined to bed or chair)  5 - Death   Eustace Pen MM, Creech RH, Tormey DC, et al. [phone]). "Toxicity and response criteria of the [facility]". [place] Oncol. 5 (6): 649-55    LABORATORY DATA:  Lab Results  Component Value Date   WBC 6.1 11/09/2017   HGB 12.3 11/09/2017   HCT 39.6 11/09/2017   MCV  89.0 11/09/2017   PLT 269 11/09/2017   Lab Results  Component Value Date   NA 141 11/09/2017   K 4.5 11/09/2017   CL 104 11/09/2017   CO2 29 11/09/2017   Lab Results  Component Value Date   ALT 34 11/09/2017   AST 23 11/09/2017   ALKPHOS 100 11/09/2017   BILITOT 0.4 11/09/2017      RADIOGRAPHY: Dg Chest 2 View  Result Date: 11/09/2017 CLINICAL DATA:  History of breast carcinoma EXAM: CHEST - 2 VIEW COMPARISON:  None. FINDINGS: Cardiac shadow is within normal limits. The lungs are well aerated bilaterally. No focal infiltrate or sizable effusion is seen. Mild degenerative changes of the thoracic spine are noted. IMPRESSION: No active cardiopulmonary disease. Electronically Signed   By: Inez Catalina M.D.   On: 11/09/2017 08:29       IMPRESSION/PLAN: 1. 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 Dr. Lisbeth Renshaw discusses the pathology findings and reviews the nature of left breast disease.  He spends time explaining the differentiation of phyllodes tumor compared to typical ductal or lobular  cancers.  Dr. Reece Levy recommends adjuvant radiotherapy for purposes of reducing her risk of recurrence.  He agrees with Dr. Virgie Dad concern that there could be up to a 20% risk of recurrence without local therapy.  We discussed the risks, benefits, short, and long term effects of radiotherapy, and the patient is interested in proceeding. Dr. Lisbeth Renshaw discusses the delivery and logistics of radiotherapy and anticipates a course of 6 1/2 weeks of radiotherapy to the chest wall and regional nodes with deep inspiration breath-hold technique.  At the conclusion of today's visit, the patient is still considering her options and may elect to forgo treatment however she is willing to move forward with scheduling simulation.  Our office will be contacting her to coordinate this for early next week.  In a visit lasting 90 minutes, greater than 50% of the time was spent face to face discussing her case, and coordinating the patient's care.  The above documentation reflects my direct findings during this shared patient visit. Please see the separate note by Dr. Lisbeth Renshaw on this date for the remainder of the patient's plan of care.    Carola Rhine, PAC

## 2017-11-10 NOTE — Progress Notes (Signed)
Called and left message with scan results

## 2017-11-16 ENCOUNTER — Encounter: Payer: Self-pay | Admitting: General Practice

## 2017-11-16 NOTE — Progress Notes (Signed)
[facility] Psychosocial Distress Screening Clinical Social Work  Clinical Social Work was referred by distress screening protocol.  The patient scored a 9 on the Psychosocial Distress Thermometer which indicates severe distress. Clinical Social Worker contacted patient by phone to assess for distress and other psychosocial needs. Per patient request, called son [name].  Left VM w information about Support Center resources and CSW contact information. Encouraged family to contact [facility] for needed resources and support.    ONCBCN DISTRESS SCREENING 11/10/2017  Screening Type Initial Screening  Distress experienced in past week (1-10) 9  Emotional problem type Adjusting to illness;Adjusting to appearance changes  Information Concerns Type Lack of info about diagnosis;Lack of info about treatment  Other Speak with Azzie Almas [phone]      Clinical Social Worker follow up needed: Yes.    If yes, follow up plan:  Await return call.   Beverely Pace, [place], LCSW Clinical Social Worker Phone:  [phone]

## 2017-11-17 ENCOUNTER — Ambulatory Visit
Admission: RE | Admit: 2017-11-17 | Discharge: 2017-11-17 | Disposition: A | Discharge status: Home / Self Care | Payer: Medicare HMO | Source: Ambulatory Visit | Attending: Radiation Oncology | Admitting: Radiation Oncology

## 2017-11-17 DIAGNOSIS — C50812 Malignant neoplasm of overlapping sites of left female breast: Secondary | ICD-10-CM | POA: Insufficient documentation

## 2017-11-17 DIAGNOSIS — Z510 Encounter for antineoplastic radiation therapy: Secondary | ICD-10-CM | POA: Insufficient documentation

## 2017-11-17 DIAGNOSIS — Z171 Estrogen receptor negative status [ER-]: Secondary | ICD-10-CM | POA: Insufficient documentation

## 2017-11-18 ENCOUNTER — Encounter: Payer: Self-pay | Admitting: Radiation Oncology

## 2017-11-18 NOTE — Progress Notes (Signed)
  Radiation Oncology         [phone] ________________________________  Name: Robin Estrada MRN: [id number]  Date: 11/17/2017  DOB: 09/20/1945  Diagnosis DIAGNOSIS:     ICD-10-CM   1. Malignant neoplasm of overlapping sites of left breast in female, estrogen receptor negative ([place]) C50.812    Z17.1      SIMULATION AND TREATMENT PLANNING NOTE  The patient presented for simulation prior to beginning her course of radiation treatment for her diagnosis of left-sided breast cancer. The patient was placed in a supine position on a breast board. A customized vac-lock bag was also constructed and this complex treatment device will be used on a daily basis during her treatment. In this fashion, a CT scan was obtained through the chest area and an isocenter was placed near the chest wall at the upper aspect of the right chest. A breath-hold technique has also been evaluated to determine if this significantly improves the spatial relationship between the target region and the heart. Based on this analysis, a breath-hold technique has not been ordered for the patient's treatment.  The patient will be planned to receive a course of radiation initially to a dose of 50.4 gray. This will consist of a 4 field technique targeting the left chest wall as well as the supraclavicular region. Therefore 2 customized medial and lateral tangent fields have been created targeting the chest wall, and also 2 additional customized fields have been designed to treat the supraclavicular region both with a left supraclavicular field and a left posterior axillary boost field. A forward planning/reduced field technique will also be evaluated to determine if this significantly improves the dose homogeneity of the overall plan. Therefore, additional customized blocks/fields may be necessary.  This initial treatment will be accomplished at 1.8 gray per fraction.   The initial plan will consist of a 3-D conformal technique. The  target volume/scar, heart and lungs have been contoured and dose volume histograms of each of these structures will be evaluated as part of the 3-D conformal treatment planning process.   It is anticipated that the patient will then receive a 10 gray boost to the surgical scar. This will be accomplished at 2 gray per fraction. The final anticipated total dose therefore will correspond to 60.4 gray.    _______________________________   Jodelle Gross, MD, PhD

## 2017-11-18 NOTE — Progress Notes (Signed)
  Radiation Oncology         [phone] ________________________________  Name: Talene Glastetter MRN: [id number]  Date: 11/17/2017  DOB: 1946/01/20  Optical Surface Tracking Plan:  Since intensity modulated radiotherapy (IMRT) and 3D conformal radiation treatment methods are predicated on accurate and precise positioning for treatment, intrafraction motion monitoring is medically necessary to ensure accurate and safe treatment delivery.  The ability to quantify intrafraction motion without excessive ionizing radiation dose can only be performed with optical surface tracking. Accordingly, surface imaging offers the opportunity to obtain 3D measurements of patient position throughout IMRT and 3D treatments without excessive radiation exposure.  I am ordering optical surface tracking for this patient's upcoming course of radiotherapy. ________________________________  Kyung Rudd, MD 11/18/2017 11:57 AM    Reference:   Ursula Alert, J, et al. Surface imaging-based analysis of intrafraction motion for breast radiotherapy patients.Journal of [place], n. 6, nov. 2014. ISSN [id number].   Available at: <http://www.jacmp.org/index.php/jacmp/article/view/4957>.

## 2017-11-19 ENCOUNTER — Telehealth: Payer: Self-pay | Admitting: General Practice

## 2017-11-19 NOTE — Telephone Encounter (Signed)
[facility] CSW Progress Note  Second call to patient/son Slava to complete Distress Screen protocol.  Unable to reach patient or family.  Left VM requesting call back.  Edwyna Shell, LCSW Clinical Social Worker Phone:  [phone]

## 2017-11-20 DIAGNOSIS — Z510 Encounter for antineoplastic radiation therapy: Secondary | ICD-10-CM | POA: Diagnosis not present

## 2017-11-20 DIAGNOSIS — C50812 Malignant neoplasm of overlapping sites of left female breast: Secondary | ICD-10-CM | POA: Diagnosis not present

## 2017-11-20 DIAGNOSIS — Z171 Estrogen receptor negative status [ER-]: Secondary | ICD-10-CM | POA: Diagnosis not present

## 2017-11-24 ENCOUNTER — Ambulatory Visit
Admission: RE | Admit: 2017-11-24 | Discharge: 2017-11-24 | Disposition: A | Discharge status: Home / Self Care | Payer: Medicare HMO | Source: Ambulatory Visit | Attending: Radiation Oncology | Admitting: Radiation Oncology

## 2017-11-24 DIAGNOSIS — C50812 Malignant neoplasm of overlapping sites of left female breast: Secondary | ICD-10-CM | POA: Diagnosis not present

## 2017-11-24 DIAGNOSIS — Z171 Estrogen receptor negative status [ER-]: Secondary | ICD-10-CM | POA: Diagnosis not present

## 2017-11-24 DIAGNOSIS — Z510 Encounter for antineoplastic radiation therapy: Secondary | ICD-10-CM | POA: Diagnosis not present

## 2017-11-24 NOTE — Progress Notes (Signed)
  Radiation Oncology         [phone] ________________________________  Name: Nishi Neiswonger MRN: [id number]  Date: 11/24/2017  DOB: 1945/10/07  Simulation Verification Note    ICD-10-CM   1. Malignant neoplasm of overlapping sites of left breast in female, estrogen receptor negative ([place]) C50.812    Z17.1     Status: outpatient  NARRATIVE: The patient was brought to the treatment unit and placed in the planned treatment position. The clinical setup was verified. Then port films were obtained and uploaded to the radiation oncology medical record software.  The treatment beams were carefully compared against the planned radiation fields. The position location and shape of the radiation fields was reviewed. They targeted volume of tissue appears to be appropriately covered by the radiation beams. Organs at risk appear to be excluded as planned.  Based on my personal review, I approved the simulation verification. The patient's treatment will proceed as planned.  -----------------------------------  Blair Promise, PhD, MD

## 2017-11-25 ENCOUNTER — Ambulatory Visit
Admission: RE | Admit: 2017-11-25 | Discharge: 2017-11-25 | Disposition: A | Discharge status: Home / Self Care | Payer: Medicare HMO | Source: Ambulatory Visit | Attending: Radiation Oncology | Admitting: Radiation Oncology

## 2017-11-25 DIAGNOSIS — Z171 Estrogen receptor negative status [ER-]: Secondary | ICD-10-CM | POA: Diagnosis not present

## 2017-11-25 DIAGNOSIS — C50812 Malignant neoplasm of overlapping sites of left female breast: Secondary | ICD-10-CM | POA: Diagnosis not present

## 2017-11-25 DIAGNOSIS — Z510 Encounter for antineoplastic radiation therapy: Secondary | ICD-10-CM | POA: Diagnosis not present

## 2017-11-26 ENCOUNTER — Ambulatory Visit
Admission: RE | Admit: 2017-11-26 | Discharge: 2017-11-26 | Disposition: A | Discharge status: Home / Self Care | Payer: Medicare HMO | Source: Ambulatory Visit | Attending: Radiation Oncology | Admitting: Radiation Oncology

## 2017-11-26 DIAGNOSIS — Z171 Estrogen receptor negative status [ER-]: Secondary | ICD-10-CM | POA: Diagnosis not present

## 2017-11-26 DIAGNOSIS — Z510 Encounter for antineoplastic radiation therapy: Secondary | ICD-10-CM | POA: Diagnosis not present

## 2017-11-26 DIAGNOSIS — C50812 Malignant neoplasm of overlapping sites of left female breast: Secondary | ICD-10-CM | POA: Diagnosis not present

## 2017-11-27 ENCOUNTER — Ambulatory Visit
Admission: RE | Admit: 2017-11-27 | Discharge: 2017-11-27 | Disposition: A | Discharge status: Home / Self Care | Payer: Medicare HMO | Source: Ambulatory Visit | Attending: Radiation Oncology | Admitting: Radiation Oncology

## 2017-11-27 DIAGNOSIS — C50812 Malignant neoplasm of overlapping sites of left female breast: Secondary | ICD-10-CM | POA: Diagnosis not present

## 2017-11-27 DIAGNOSIS — Z510 Encounter for antineoplastic radiation therapy: Secondary | ICD-10-CM | POA: Diagnosis not present

## 2017-11-27 DIAGNOSIS — Z171 Estrogen receptor negative status [ER-]: Principal | ICD-10-CM

## 2017-11-27 MED ORDER — ALRA NON-METALLIC DEODORANT (RAD-ONC)
1.0000 "application " | Freq: Once | TOPICAL | Status: AC
  Administered 2017-11-27: 1 via TOPICAL

## 2017-11-27 MED ORDER — RADIAPLEXRX EX GEL
Freq: Once | CUTANEOUS | Status: AC
  Administered 2017-11-27: 16:00:00 via TOPICAL

## 2017-11-30 ENCOUNTER — Ambulatory Visit
Admission: RE | Admit: 2017-11-30 | Discharge: 2017-11-30 | Disposition: A | Discharge status: Home / Self Care | Payer: Medicare HMO | Source: Ambulatory Visit | Attending: Radiation Oncology | Admitting: Radiation Oncology

## 2017-11-30 DIAGNOSIS — C50812 Malignant neoplasm of overlapping sites of left female breast: Secondary | ICD-10-CM | POA: Diagnosis not present

## 2017-11-30 DIAGNOSIS — Z171 Estrogen receptor negative status [ER-]: Secondary | ICD-10-CM | POA: Diagnosis not present

## 2017-11-30 DIAGNOSIS — Z510 Encounter for antineoplastic radiation therapy: Secondary | ICD-10-CM | POA: Diagnosis not present

## 2017-12-01 ENCOUNTER — Ambulatory Visit
Admission: RE | Admit: 2017-12-01 | Discharge: 2017-12-01 | Disposition: A | Discharge status: Home / Self Care | Payer: Medicare HMO | Source: Ambulatory Visit | Attending: Radiation Oncology | Admitting: Radiation Oncology

## 2017-12-01 DIAGNOSIS — C50812 Malignant neoplasm of overlapping sites of left female breast: Secondary | ICD-10-CM | POA: Diagnosis not present

## 2017-12-01 DIAGNOSIS — Z171 Estrogen receptor negative status [ER-]: Secondary | ICD-10-CM | POA: Diagnosis not present

## 2017-12-01 DIAGNOSIS — Z510 Encounter for antineoplastic radiation therapy: Secondary | ICD-10-CM | POA: Diagnosis not present

## 2017-12-02 ENCOUNTER — Ambulatory Visit
Admission: RE | Admit: 2017-12-02 | Discharge: 2017-12-02 | Disposition: A | Discharge status: Home / Self Care | Payer: Medicare HMO | Source: Ambulatory Visit | Attending: Radiation Oncology | Admitting: Radiation Oncology

## 2017-12-02 DIAGNOSIS — C50812 Malignant neoplasm of overlapping sites of left female breast: Secondary | ICD-10-CM | POA: Diagnosis not present

## 2017-12-02 DIAGNOSIS — Z510 Encounter for antineoplastic radiation therapy: Secondary | ICD-10-CM | POA: Diagnosis not present

## 2017-12-02 DIAGNOSIS — Z171 Estrogen receptor negative status [ER-]: Secondary | ICD-10-CM | POA: Diagnosis not present

## 2017-12-03 ENCOUNTER — Ambulatory Visit
Admission: RE | Admit: 2017-12-03 | Discharge: 2017-12-03 | Disposition: A | Discharge status: Home / Self Care | Payer: Medicare HMO | Source: Ambulatory Visit | Attending: Radiation Oncology | Admitting: Radiation Oncology

## 2017-12-03 DIAGNOSIS — Z510 Encounter for antineoplastic radiation therapy: Secondary | ICD-10-CM | POA: Diagnosis not present

## 2017-12-03 DIAGNOSIS — Z171 Estrogen receptor negative status [ER-]: Secondary | ICD-10-CM | POA: Diagnosis not present

## 2017-12-03 DIAGNOSIS — C50912 Malignant neoplasm of unspecified site of left female breast: Secondary | ICD-10-CM | POA: Diagnosis not present

## 2017-12-03 DIAGNOSIS — C50812 Malignant neoplasm of overlapping sites of left female breast: Secondary | ICD-10-CM | POA: Diagnosis not present

## 2017-12-04 ENCOUNTER — Ambulatory Visit
Admission: RE | Admit: 2017-12-04 | Discharge: 2017-12-04 | Disposition: A | Discharge status: Home / Self Care | Payer: Medicare HMO | Source: Ambulatory Visit | Attending: Radiation Oncology | Admitting: Radiation Oncology

## 2017-12-04 DIAGNOSIS — Z171 Estrogen receptor negative status [ER-]: Secondary | ICD-10-CM | POA: Diagnosis not present

## 2017-12-04 DIAGNOSIS — Z510 Encounter for antineoplastic radiation therapy: Secondary | ICD-10-CM | POA: Diagnosis not present

## 2017-12-04 DIAGNOSIS — C50812 Malignant neoplasm of overlapping sites of left female breast: Secondary | ICD-10-CM | POA: Diagnosis not present

## 2017-12-07 ENCOUNTER — Ambulatory Visit
Admission: RE | Admit: 2017-12-07 | Discharge: 2017-12-07 | Disposition: A | Discharge status: Home / Self Care | Payer: Medicare HMO | Source: Ambulatory Visit | Attending: Radiation Oncology | Admitting: Radiation Oncology

## 2017-12-07 DIAGNOSIS — C50812 Malignant neoplasm of overlapping sites of left female breast: Secondary | ICD-10-CM | POA: Diagnosis not present

## 2017-12-07 DIAGNOSIS — Z510 Encounter for antineoplastic radiation therapy: Secondary | ICD-10-CM | POA: Diagnosis not present

## 2017-12-07 DIAGNOSIS — Z171 Estrogen receptor negative status [ER-]: Secondary | ICD-10-CM | POA: Diagnosis not present

## 2017-12-08 ENCOUNTER — Ambulatory Visit
Admission: RE | Admit: 2017-12-08 | Discharge: 2017-12-08 | Disposition: A | Discharge status: Home / Self Care | Payer: Medicare HMO | Source: Ambulatory Visit | Attending: Radiation Oncology | Admitting: Radiation Oncology

## 2017-12-08 DIAGNOSIS — C50812 Malignant neoplasm of overlapping sites of left female breast: Secondary | ICD-10-CM | POA: Diagnosis not present

## 2017-12-08 DIAGNOSIS — Z171 Estrogen receptor negative status [ER-]: Secondary | ICD-10-CM | POA: Diagnosis not present

## 2017-12-08 DIAGNOSIS — Z510 Encounter for antineoplastic radiation therapy: Secondary | ICD-10-CM | POA: Diagnosis not present

## 2017-12-09 ENCOUNTER — Ambulatory Visit
Admission: RE | Admit: 2017-12-09 | Discharge: 2017-12-09 | Disposition: A | Discharge status: Home / Self Care | Payer: Medicare HMO | Source: Ambulatory Visit | Attending: Radiation Oncology | Admitting: Radiation Oncology

## 2017-12-09 DIAGNOSIS — C50812 Malignant neoplasm of overlapping sites of left female breast: Secondary | ICD-10-CM | POA: Diagnosis not present

## 2017-12-09 DIAGNOSIS — Z171 Estrogen receptor negative status [ER-]: Secondary | ICD-10-CM | POA: Diagnosis not present

## 2017-12-09 DIAGNOSIS — Z510 Encounter for antineoplastic radiation therapy: Secondary | ICD-10-CM | POA: Diagnosis not present

## 2017-12-10 ENCOUNTER — Ambulatory Visit
Admission: RE | Admit: 2017-12-10 | Discharge: 2017-12-10 | Disposition: A | Discharge status: Home / Self Care | Payer: Medicare HMO | Source: Ambulatory Visit | Attending: Radiation Oncology | Admitting: Radiation Oncology

## 2017-12-10 DIAGNOSIS — C50812 Malignant neoplasm of overlapping sites of left female breast: Secondary | ICD-10-CM | POA: Diagnosis not present

## 2017-12-10 DIAGNOSIS — Z510 Encounter for antineoplastic radiation therapy: Secondary | ICD-10-CM | POA: Diagnosis not present

## 2017-12-11 ENCOUNTER — Ambulatory Visit
Admission: RE | Admit: 2017-12-11 | Discharge: 2017-12-11 | Disposition: A | Discharge status: Home / Self Care | Payer: Medicare HMO | Source: Ambulatory Visit | Attending: Radiation Oncology | Admitting: Radiation Oncology

## 2017-12-11 DIAGNOSIS — C50812 Malignant neoplasm of overlapping sites of left female breast: Secondary | ICD-10-CM | POA: Diagnosis not present

## 2017-12-11 DIAGNOSIS — Z510 Encounter for antineoplastic radiation therapy: Secondary | ICD-10-CM | POA: Diagnosis not present

## 2017-12-14 ENCOUNTER — Ambulatory Visit
Admission: RE | Admit: 2017-12-14 | Discharge: 2017-12-14 | Disposition: A | Discharge status: Home / Self Care | Payer: Medicare HMO | Source: Ambulatory Visit | Attending: Radiation Oncology | Admitting: Radiation Oncology

## 2017-12-14 DIAGNOSIS — C50812 Malignant neoplasm of overlapping sites of left female breast: Secondary | ICD-10-CM | POA: Diagnosis not present

## 2017-12-14 DIAGNOSIS — Z510 Encounter for antineoplastic radiation therapy: Secondary | ICD-10-CM | POA: Diagnosis not present

## 2017-12-15 ENCOUNTER — Ambulatory Visit
Admission: RE | Admit: 2017-12-15 | Discharge: 2017-12-15 | Disposition: A | Discharge status: Home / Self Care | Payer: Medicare HMO | Source: Ambulatory Visit | Attending: Radiation Oncology | Admitting: Radiation Oncology

## 2017-12-15 DIAGNOSIS — Z510 Encounter for antineoplastic radiation therapy: Secondary | ICD-10-CM | POA: Diagnosis not present

## 2017-12-15 DIAGNOSIS — C50812 Malignant neoplasm of overlapping sites of left female breast: Secondary | ICD-10-CM | POA: Diagnosis not present

## 2017-12-16 ENCOUNTER — Ambulatory Visit
Admission: RE | Admit: 2017-12-16 | Discharge: 2017-12-16 | Disposition: A | Discharge status: Home / Self Care | Payer: Medicare HMO | Source: Ambulatory Visit | Attending: Radiation Oncology | Admitting: Radiation Oncology

## 2017-12-16 DIAGNOSIS — C50812 Malignant neoplasm of overlapping sites of left female breast: Secondary | ICD-10-CM | POA: Diagnosis not present

## 2017-12-16 DIAGNOSIS — Z510 Encounter for antineoplastic radiation therapy: Secondary | ICD-10-CM | POA: Diagnosis not present

## 2017-12-17 ENCOUNTER — Ambulatory Visit
Admission: RE | Admit: 2017-12-17 | Discharge: 2017-12-17 | Disposition: A | Discharge status: Home / Self Care | Payer: Medicare HMO | Source: Ambulatory Visit | Attending: Radiation Oncology | Admitting: Radiation Oncology

## 2017-12-17 DIAGNOSIS — Z510 Encounter for antineoplastic radiation therapy: Secondary | ICD-10-CM | POA: Diagnosis not present

## 2017-12-17 DIAGNOSIS — C50812 Malignant neoplasm of overlapping sites of left female breast: Secondary | ICD-10-CM | POA: Diagnosis not present

## 2017-12-18 ENCOUNTER — Telehealth: Payer: Self-pay | Admitting: Internal Medicine

## 2017-12-18 ENCOUNTER — Ambulatory Visit
Admission: RE | Admit: 2017-12-18 | Discharge: 2017-12-18 | Disposition: A | Discharge status: Home / Self Care | Payer: Medicare HMO | Source: Ambulatory Visit | Attending: Radiation Oncology | Admitting: Radiation Oncology

## 2017-12-18 DIAGNOSIS — Z510 Encounter for antineoplastic radiation therapy: Secondary | ICD-10-CM | POA: Diagnosis not present

## 2017-12-18 DIAGNOSIS — C50812 Malignant neoplasm of overlapping sites of left female breast: Secondary | ICD-10-CM | POA: Diagnosis not present

## 2017-12-18 NOTE — Telephone Encounter (Signed)
Attempted to contact pt's son, Robin Estrada, regarding refill request and symptoms; losartan was discontinued on 04/17/17 and is not on pt medication list; left message at [phone].

## 2017-12-18 NOTE — Telephone Encounter (Signed)
Pt's son, Robin Estrada calling stating that the pt is requesting to restart Losartan(Cozaar) 50mg  tab again. Medication was discontinued on 04/17/17.Pt is currently undergoing radiation treatments and with treatments the pt is experiencing headaches and increase in BP. Pt states that this medication helped with BP and headaches before. Pt is currently taking Atenolol daily and states that BP readings have been good, specific BP readings not give. Pt would like for prescription to be sent to [facility] on [address].

## 2017-12-18 NOTE — Telephone Encounter (Signed)
Copied from [place] [id number]. Topic: Quick Communication - See Telephone Encounter >> Dec 18, 2017  9:12 AM Vernona Rieger wrote: CRM for notification. See Telephone encounter for: 12/18/17.  Patient's son called and said that [facility] has been faxing the office over the last couple weeks for a refill on her losartan (COZAAR) 50 MG tablet [[id number]]  DISCONTINUED . Her son stated that she wants to start taking that again due to having treatments and it is causing her to have headaches.   Call back [phone] Jaynie Bream )

## 2017-12-19 MED ORDER — LOSARTAN POTASSIUM 50 MG PO TABS: 50.0000 mg | ORAL_TABLET | Freq: Every day | ORAL | 11 refills | Status: DC

## 2017-12-19 NOTE — Telephone Encounter (Signed)
Okay to restart losartan.  Thank you

## 2017-12-19 NOTE — Addendum Note (Signed)
Addended by: [name] on: 12/19/2017 02:11 PM   Modules accepted: Orders

## 2017-12-21 ENCOUNTER — Ambulatory Visit
Admission: RE | Admit: 2017-12-21 | Discharge: 2017-12-21 | Disposition: A | Discharge status: Home / Self Care | Payer: Medicare HMO | Source: Ambulatory Visit | Attending: Radiation Oncology | Admitting: Radiation Oncology

## 2017-12-21 DIAGNOSIS — C50812 Malignant neoplasm of overlapping sites of left female breast: Secondary | ICD-10-CM | POA: Diagnosis not present

## 2017-12-21 DIAGNOSIS — Z510 Encounter for antineoplastic radiation therapy: Secondary | ICD-10-CM | POA: Diagnosis not present

## 2017-12-21 NOTE — Telephone Encounter (Signed)
Notified pt son w/MD response.Marland KitchenJohny Estrada

## 2017-12-22 ENCOUNTER — Ambulatory Visit
Admission: RE | Admit: 2017-12-22 | Discharge: 2017-12-22 | Disposition: A | Discharge status: Home / Self Care | Payer: Medicare HMO | Source: Ambulatory Visit | Attending: Radiation Oncology | Admitting: Radiation Oncology

## 2017-12-22 DIAGNOSIS — Z510 Encounter for antineoplastic radiation therapy: Secondary | ICD-10-CM | POA: Diagnosis not present

## 2017-12-22 DIAGNOSIS — C50812 Malignant neoplasm of overlapping sites of left female breast: Secondary | ICD-10-CM

## 2017-12-22 DIAGNOSIS — Z171 Estrogen receptor negative status [ER-]: Principal | ICD-10-CM

## 2017-12-22 MED ORDER — SONAFINE EX EMUL
1.0000 "application " | Freq: Once | CUTANEOUS | Status: AC
  Administered 2017-12-22: 1 via TOPICAL

## 2017-12-23 ENCOUNTER — Ambulatory Visit
Admission: RE | Admit: 2017-12-23 | Discharge: 2017-12-23 | Disposition: A | Discharge status: Home / Self Care | Payer: Medicare HMO | Source: Ambulatory Visit | Attending: Radiation Oncology | Admitting: Radiation Oncology

## 2017-12-23 DIAGNOSIS — Z510 Encounter for antineoplastic radiation therapy: Secondary | ICD-10-CM | POA: Diagnosis not present

## 2017-12-23 DIAGNOSIS — C50812 Malignant neoplasm of overlapping sites of left female breast: Secondary | ICD-10-CM | POA: Diagnosis not present

## 2017-12-24 ENCOUNTER — Ambulatory Visit
Admission: RE | Admit: 2017-12-24 | Discharge: 2017-12-24 | Disposition: A | Discharge status: Home / Self Care | Payer: Medicare HMO | Source: Ambulatory Visit | Attending: Radiation Oncology | Admitting: Radiation Oncology

## 2017-12-24 DIAGNOSIS — Z510 Encounter for antineoplastic radiation therapy: Secondary | ICD-10-CM | POA: Diagnosis not present

## 2017-12-24 DIAGNOSIS — C50812 Malignant neoplasm of overlapping sites of left female breast: Secondary | ICD-10-CM | POA: Diagnosis not present

## 2017-12-25 ENCOUNTER — Ambulatory Visit
Admission: RE | Admit: 2017-12-25 | Discharge: 2017-12-25 | Disposition: A | Discharge status: Home / Self Care | Payer: Medicare HMO | Source: Ambulatory Visit | Attending: Radiation Oncology | Admitting: Radiation Oncology

## 2017-12-25 DIAGNOSIS — Z510 Encounter for antineoplastic radiation therapy: Secondary | ICD-10-CM | POA: Diagnosis not present

## 2017-12-25 DIAGNOSIS — C50812 Malignant neoplasm of overlapping sites of left female breast: Secondary | ICD-10-CM | POA: Diagnosis not present

## 2017-12-27 ENCOUNTER — Ambulatory Visit: Admission: RE | Admit: 2017-12-27 | Payer: Medicare HMO | Source: Ambulatory Visit

## 2017-12-28 ENCOUNTER — Ambulatory Visit
Admission: RE | Admit: 2017-12-28 | Discharge: 2017-12-28 | Disposition: A | Discharge status: Home / Self Care | Payer: Medicare HMO | Source: Ambulatory Visit | Attending: Radiation Oncology | Admitting: Radiation Oncology

## 2017-12-28 DIAGNOSIS — C50812 Malignant neoplasm of overlapping sites of left female breast: Secondary | ICD-10-CM | POA: Diagnosis not present

## 2017-12-28 DIAGNOSIS — Z510 Encounter for antineoplastic radiation therapy: Secondary | ICD-10-CM | POA: Diagnosis not present

## 2017-12-29 ENCOUNTER — Ambulatory Visit
Admission: RE | Admit: 2017-12-29 | Discharge: 2017-12-29 | Disposition: A | Discharge status: Home / Self Care | Payer: Medicare HMO | Source: Ambulatory Visit | Attending: Radiation Oncology | Admitting: Radiation Oncology

## 2017-12-29 ENCOUNTER — Ambulatory Visit: Payer: Medicare HMO | Admitting: Radiation Oncology

## 2017-12-29 DIAGNOSIS — C50812 Malignant neoplasm of overlapping sites of left female breast: Secondary | ICD-10-CM | POA: Diagnosis not present

## 2017-12-29 DIAGNOSIS — Z510 Encounter for antineoplastic radiation therapy: Secondary | ICD-10-CM | POA: Diagnosis not present

## 2017-12-30 ENCOUNTER — Ambulatory Visit
Admission: RE | Admit: 2017-12-30 | Discharge: 2017-12-30 | Disposition: A | Discharge status: Home / Self Care | Payer: Medicare HMO | Source: Ambulatory Visit | Attending: Radiation Oncology | Admitting: Radiation Oncology

## 2017-12-30 DIAGNOSIS — C50812 Malignant neoplasm of overlapping sites of left female breast: Secondary | ICD-10-CM | POA: Diagnosis not present

## 2017-12-30 DIAGNOSIS — Z510 Encounter for antineoplastic radiation therapy: Secondary | ICD-10-CM | POA: Diagnosis not present

## 2017-12-31 ENCOUNTER — Ambulatory Visit
Admission: RE | Admit: 2017-12-31 | Discharge: 2017-12-31 | Disposition: A | Discharge status: Home / Self Care | Payer: Medicare HMO | Source: Ambulatory Visit | Attending: Radiation Oncology | Admitting: Radiation Oncology

## 2017-12-31 DIAGNOSIS — C50812 Malignant neoplasm of overlapping sites of left female breast: Secondary | ICD-10-CM | POA: Diagnosis not present

## 2017-12-31 DIAGNOSIS — Z510 Encounter for antineoplastic radiation therapy: Secondary | ICD-10-CM | POA: Diagnosis not present

## 2018-01-01 ENCOUNTER — Ambulatory Visit
Admission: RE | Admit: 2018-01-01 | Discharge: 2018-01-01 | Disposition: A | Discharge status: Home / Self Care | Payer: Medicare HMO | Source: Ambulatory Visit | Attending: Radiation Oncology | Admitting: Radiation Oncology

## 2018-01-01 DIAGNOSIS — C50812 Malignant neoplasm of overlapping sites of left female breast: Secondary | ICD-10-CM | POA: Diagnosis not present

## 2018-01-01 DIAGNOSIS — Z510 Encounter for antineoplastic radiation therapy: Secondary | ICD-10-CM | POA: Diagnosis not present

## 2018-01-04 ENCOUNTER — Ambulatory Visit
Admission: RE | Admit: 2018-01-04 | Discharge: 2018-01-04 | Disposition: A | Discharge status: Home / Self Care | Payer: Medicare HMO | Source: Ambulatory Visit | Attending: Radiation Oncology | Admitting: Radiation Oncology

## 2018-01-04 DIAGNOSIS — Z510 Encounter for antineoplastic radiation therapy: Secondary | ICD-10-CM | POA: Diagnosis not present

## 2018-01-05 ENCOUNTER — Ambulatory Visit
Admission: RE | Admit: 2018-01-05 | Discharge: 2018-01-05 | Disposition: A | Discharge status: Home / Self Care | Payer: Medicare HMO | Source: Ambulatory Visit | Attending: Radiation Oncology | Admitting: Radiation Oncology

## 2018-01-05 DIAGNOSIS — C50812 Malignant neoplasm of overlapping sites of left female breast: Secondary | ICD-10-CM | POA: Diagnosis not present

## 2018-01-05 DIAGNOSIS — Z510 Encounter for antineoplastic radiation therapy: Secondary | ICD-10-CM | POA: Diagnosis not present

## 2018-01-06 ENCOUNTER — Ambulatory Visit
Admission: RE | Admit: 2018-01-06 | Discharge: 2018-01-06 | Disposition: A | Discharge status: Home / Self Care | Payer: Medicare HMO | Source: Ambulatory Visit | Attending: Radiation Oncology | Admitting: Radiation Oncology

## 2018-01-06 DIAGNOSIS — Z510 Encounter for antineoplastic radiation therapy: Secondary | ICD-10-CM | POA: Diagnosis not present

## 2018-01-07 ENCOUNTER — Ambulatory Visit
Admission: RE | Admit: 2018-01-07 | Discharge: 2018-01-07 | Disposition: A | Discharge status: Home / Self Care | Payer: Medicare HMO | Source: Ambulatory Visit | Attending: Radiation Oncology | Admitting: Radiation Oncology

## 2018-01-07 DIAGNOSIS — C50812 Malignant neoplasm of overlapping sites of left female breast: Secondary | ICD-10-CM

## 2018-01-07 DIAGNOSIS — Z171 Estrogen receptor negative status [ER-]: Principal | ICD-10-CM

## 2018-01-07 DIAGNOSIS — Z510 Encounter for antineoplastic radiation therapy: Secondary | ICD-10-CM | POA: Diagnosis not present

## 2018-01-07 MED ORDER — SILVER SULFADIAZINE 1 % EX CREA
TOPICAL_CREAM | Freq: Every day | CUTANEOUS | Status: DC
  Administered 2018-01-07: 09:00:00 via TOPICAL

## 2018-01-08 ENCOUNTER — Encounter: Payer: Self-pay | Admitting: Radiation Oncology

## 2018-01-08 ENCOUNTER — Ambulatory Visit
Admission: RE | Admit: 2018-01-08 | Discharge: 2018-01-08 | Disposition: A | Discharge status: Home / Self Care | Payer: Medicare HMO | Source: Ambulatory Visit | Attending: Radiation Oncology | Admitting: Radiation Oncology

## 2018-01-08 DIAGNOSIS — Z510 Encounter for antineoplastic radiation therapy: Secondary | ICD-10-CM | POA: Diagnosis not present

## 2018-01-08 DIAGNOSIS — C50812 Malignant neoplasm of overlapping sites of left female breast: Secondary | ICD-10-CM | POA: Diagnosis not present

## 2018-01-26 NOTE — Progress Notes (Signed)
  Radiation Oncology         [phone] ________________________________  Name: Robin Estrada MRN: [id number]  Date: 01/08/2018  DOB: 1946/01/15  End of Treatment Note  Diagnosis:   72 y.o. 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 This was delivered using a 3-D conformal, 4 field technique. The patient then received a boost to the mastectomy scar. This delivered an additional 10 Gy in 5 fractions using an en face electron field. The total dose was 60.4 Gy.  Narrative: The patient tolerated radiation treatment relatively well.   The patient had some expected skin irritation with dermatitis as she progressed during treatment. Moist desquamation was present in the upper axilla with surrounding bright erythema. The patient has been given Silvadene cream to begin using.   Plan: The patient has completed radiation treatment. The patient will return to radiation oncology clinic for routine followup in one month. I advised the patient to call or return sooner if they have any questions or concerns related to their recovery or treatment. ________________________________  Jodelle Gross, MD, PhD  This document serves as a record of services personally performed by Kyung Rudd, MD. It was created on his behalf by Rae Lips, a trained medical scribe. The creation of this record is based on the scribe's personal observations and the provider's statements to them. This document has been checked and approved by the attending provider.

## 2018-02-09 ENCOUNTER — Ambulatory Visit: Payer: Self-pay | Admitting: Radiation Oncology

## 2018-02-15 ENCOUNTER — Telehealth: Payer: Self-pay | Admitting: Oncology

## 2018-02-15 ENCOUNTER — Encounter: Payer: Self-pay | Admitting: Radiation Oncology

## 2018-02-15 ENCOUNTER — Ambulatory Visit
Admission: RE | Admit: 2018-02-15 | Discharge: 2018-02-15 | Disposition: A | Discharge status: Home / Self Care | Payer: Medicare HMO | Source: Ambulatory Visit | Attending: Radiation Oncology | Admitting: Radiation Oncology

## 2018-02-15 ENCOUNTER — Other Ambulatory Visit: Payer: Self-pay

## 2018-02-15 VITALS — BP 188/83 | HR 64 | Temp 97.8°F | Resp 20 | Ht 60.0 in | Wt 170.2 lb

## 2018-02-15 DIAGNOSIS — Z7982 Long term (current) use of aspirin: Secondary | ICD-10-CM | POA: Diagnosis not present

## 2018-02-15 DIAGNOSIS — Z882 Allergy status to sulfonamides status: Secondary | ICD-10-CM | POA: Insufficient documentation

## 2018-02-15 DIAGNOSIS — Z888 Allergy status to other drugs, medicaments and biological substances status: Secondary | ICD-10-CM | POA: Diagnosis not present

## 2018-02-15 DIAGNOSIS — Z853 Personal history of malignant neoplasm of breast: Secondary | ICD-10-CM

## 2018-02-15 DIAGNOSIS — Z79899 Other long term (current) drug therapy: Secondary | ICD-10-CM | POA: Diagnosis not present

## 2018-02-15 DIAGNOSIS — I10 Essential (primary) hypertension: Secondary | ICD-10-CM | POA: Diagnosis not present

## 2018-02-15 DIAGNOSIS — C50812 Malignant neoplasm of overlapping sites of left female breast: Secondary | ICD-10-CM

## 2018-02-15 DIAGNOSIS — I890 Lymphedema, not elsewhere classified: Secondary | ICD-10-CM | POA: Insufficient documentation

## 2018-02-15 DIAGNOSIS — Z885 Allergy status to narcotic agent status: Secondary | ICD-10-CM | POA: Insufficient documentation

## 2018-02-15 DIAGNOSIS — Z171 Estrogen receptor negative status [ER-]: Secondary | ICD-10-CM

## 2018-02-15 NOTE — Telephone Encounter (Signed)
Pt called to r/s appts   °

## 2018-02-15 NOTE — Addendum Note (Signed)
Encounter addended by: Malena Edman, RN on: 02/15/2018 10:38 AM  Actions taken: Charge Capture section accepted

## 2018-02-15 NOTE — Progress Notes (Signed)
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 This was delivered using a 3-D conformal, 4 field technique. The patient then received a boost to the mastectomy scar. This delivered an additional 10 Gy in 5 fractions using an en face electron field. The total dose was 60.4 Gy.  Narrative:  The patient returns today for routine follow-up. During treatment she did very well with radiotherapy and did not have significant desquamation.                             On review of systems, the patient states she's doing well. She is having some left upper extremity swelling. She denies concerns with her skin.  ALLERGIES:  is allergic to hydrocodone-acetaminophen; statins; sulfa antibiotics; and sulfamethoxazole-trimethoprim.  Meds: Current Outpatient Medications  Medication Sig Dispense Refill  . acetaminophen (TYLENOL) 500 MG tablet Take 1,000 mg by mouth every 6 (six) hours as needed for headache.    Marland Kitchen aspirin 81 MG tablet Take 81 mg by mouth daily.    Marland Kitchen atenolol (TENORMIN) 50 MG tablet Take 1 tablet (50 mg total) by mouth 2 (two) times daily. 180 tablet 3  . butalbital-acetaminophen-caffeine (FIORICET) 50-325-40 MG tablet Take 1-2 tablets by mouth 2 (two) times daily as needed for headache. 60 tablet 1  . cholecalciferol (VITAMIN D) 1000 units tablet Take 1 tablet (1,000 Units total) by mouth daily. 100 tablet 3  . losartan (COZAAR) 50 MG tablet Take 1 tablet (50 mg total) by mouth daily. 30 tablet 11  . vitamin C  (ASCORBIC ACID) 500 MG tablet Take 500 mg by mouth daily.    Marland Kitchen ibuprofen (ADVIL,MOTRIN) 200 MG tablet Take 200 mg by mouth every 6 (six) hours as needed for headache.     No current facility-administered medications for this encounter.     Physical Findings:  height is 5' (1.524 m) and weight is 170 lb 3.2 oz (77.2 kg). Her oral temperature is 97.8 F (36.6 C). Her blood pressure is 188/83 (abnormal) and her pulse is 64. Her respiration is 20 and oxygen saturation is 97%.  Pain Assessment Pain Score: 0-No pain/10 In general this is a well appearing caucasian female in no acute distress. She's alert and oriented x4 and appropriate throughout the examination. Cardiopulmonary assessment is negative for acute distress and she exhibits normal effort. The left chest wall was examined and reveals only very mild hyperpigmentation without desquamation. There is mild left sided edema of her upper extremity.   Lab Findings: Lab Results  Component Value Date   WBC 6.1 11/09/2017   HGB 12.3 11/09/2017   HCT 39.6 11/09/2017   MCV 89.0 11/09/2017   PLT 269 11/09/2017     Radiographic Findings: No results found.  Impression/Plan: 1. 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 The patient has been doing well since completion of radiotherapy. We discussed that we would be happy to continue to follow her as needed,  but she will also continue to follow up with Dr. Jana Hakim in medical oncology. She was counseled on skin care as well as measures to avoid sun exposure to this area.  2. LUE lymphedema. I reviewed the rationale to proceed with an evaluation with PT. She is in agreement. 3. HTN. The patient is asymptomatic and will continue her medication for HTN and follow up with her PCP.      Carola Rhine, PAC

## 2018-02-16 ENCOUNTER — Inpatient Hospital Stay: Payer: Medicare HMO

## 2018-02-16 ENCOUNTER — Inpatient Hospital Stay: Payer: Medicare HMO | Admitting: Oncology

## 2018-02-24 DIAGNOSIS — Z803 Family history of malignant neoplasm of breast: Secondary | ICD-10-CM | POA: Diagnosis not present

## 2018-02-24 DIAGNOSIS — I10 Essential (primary) hypertension: Secondary | ICD-10-CM | POA: Diagnosis not present

## 2018-02-24 DIAGNOSIS — C50912 Malignant neoplasm of unspecified site of left female breast: Secondary | ICD-10-CM | POA: Diagnosis not present

## 2018-04-05 DIAGNOSIS — H11153 Pinguecula, bilateral: Secondary | ICD-10-CM | POA: Diagnosis not present

## 2018-04-05 DIAGNOSIS — H2513 Age-related nuclear cataract, bilateral: Secondary | ICD-10-CM | POA: Diagnosis not present

## 2018-04-05 DIAGNOSIS — H02401 Unspecified ptosis of right eyelid: Secondary | ICD-10-CM | POA: Diagnosis not present

## 2018-04-05 DIAGNOSIS — H353132 Nonexudative age-related macular degeneration, bilateral, intermediate dry stage: Secondary | ICD-10-CM | POA: Diagnosis not present

## 2018-04-05 DIAGNOSIS — H25013 Cortical age-related cataract, bilateral: Secondary | ICD-10-CM | POA: Diagnosis not present

## 2018-04-05 DIAGNOSIS — H18413 Arcus senilis, bilateral: Secondary | ICD-10-CM | POA: Diagnosis not present

## 2018-04-05 DIAGNOSIS — H25033 Anterior subcapsular polar age-related cataract, bilateral: Secondary | ICD-10-CM | POA: Diagnosis not present

## 2018-04-15 DIAGNOSIS — Z0100 Encounter for examination of eyes and vision without abnormal findings: Secondary | ICD-10-CM | POA: Diagnosis not present
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
## 2018-04-28 ENCOUNTER — Telehealth: Payer: Self-pay | Admitting: Oncology

## 2018-04-28 ENCOUNTER — Inpatient Hospital Stay (HOSPITAL_BASED_OUTPATIENT_CLINIC_OR_DEPARTMENT_OTHER): Payer: Medicare HMO | Admitting: Oncology

## 2018-04-28 ENCOUNTER — Inpatient Hospital Stay: Payer: Medicare HMO | Attending: Oncology

## 2018-04-28 VITALS — BP 168/76 | HR 77 | Temp 98.4°F | Resp 18 | Wt 169.3 lb

## 2018-04-28 DIAGNOSIS — C50812 Malignant neoplasm of overlapping sites of left female breast: Secondary | ICD-10-CM

## 2018-04-28 DIAGNOSIS — I10 Essential (primary) hypertension: Secondary | ICD-10-CM

## 2018-04-28 DIAGNOSIS — Z923 Personal history of irradiation: Secondary | ICD-10-CM | POA: Diagnosis not present

## 2018-04-28 DIAGNOSIS — Z9012 Acquired absence of left breast and nipple: Secondary | ICD-10-CM | POA: Insufficient documentation

## 2018-04-28 DIAGNOSIS — Z79899 Other long term (current) drug therapy: Secondary | ICD-10-CM | POA: Diagnosis not present

## 2018-04-28 DIAGNOSIS — Z171 Estrogen receptor negative status [ER-]: Secondary | ICD-10-CM | POA: Insufficient documentation

## 2018-04-28 DIAGNOSIS — Z853 Personal history of malignant neoplasm of breast: Secondary | ICD-10-CM | POA: Diagnosis not present

## 2018-04-28 DIAGNOSIS — Z7982 Long term (current) use of aspirin: Secondary | ICD-10-CM | POA: Diagnosis not present

## 2018-04-28 LAB — CBC WITH DIFFERENTIAL/PLATELET
ABS IMMATURE GRANULOCYTES: 0.01 10*3/uL (ref 0.00–0.07)
BASOS ABS: 0 10*3/uL (ref 0.0–0.1)
Basophils Relative: 1 %
Eosinophils Absolute: 0.2 10*3/uL (ref 0.0–0.5)
Eosinophils Relative: 3 %
HCT: 38.6 % (ref 36.0–46.0)
HEMOGLOBIN: 12.3 g/dL (ref 12.0–15.0)
IMMATURE GRANULOCYTES: 0 %
LYMPHS ABS: 1.1 10*3/uL (ref 0.7–4.0)
LYMPHS PCT: 19 %
MCH: 28.6 pg (ref 26.0–34.0)
MCHC: 31.9 g/dL (ref 30.0–36.0)
MCV: 89.8 fL (ref 80.0–100.0)
MONOS PCT: 9 %
Monocytes Absolute: 0.6 10*3/uL (ref 0.1–1.0)
NEUTROS PCT: 68 %
NRBC: 0 % (ref 0.0–0.2)
Neutro Abs: 4 10*3/uL (ref 1.7–7.7)
Platelets: 236 10*3/uL (ref 150–400)
RBC: 4.3 MIL/uL (ref 3.87–5.11)
RDW: 13.2 % (ref 11.5–15.5)
WBC: 5.9 10*3/uL (ref 4.0–10.5)

## 2018-04-28 LAB — COMPREHENSIVE METABOLIC PANEL
ALBUMIN: 3.9 g/dL (ref 3.5–5.0)
ALK PHOS: 88 U/L (ref 38–126)
ALT: 22 U/L (ref 0–44)
ANION GAP: 11 (ref 5–15)
AST: 22 U/L (ref 15–41)
BUN: 17 mg/dL (ref 8–23)
CHLORIDE: 105 mmol/L (ref 98–111)
CO2: 25 mmol/L (ref 22–32)
CREATININE: 0.84 mg/dL (ref 0.44–1.00)
Calcium: 9.4 mg/dL (ref 8.9–10.3)
GFR calc Af Amer: >60 mL/min (ref >60)
GFR calc non Af Amer: >60 mL/min (ref >60)
GLUCOSE: 105 mg/dL — AB (ref 70–99)
Potassium: 4.3 mmol/L (ref 3.5–5.1)
SODIUM: 141 mmol/L (ref 135–145)
Total Bilirubin: 0.3 mg/dL (ref 0.3–1.2)
Total Protein: 7 g/dL (ref 6.5–8.1)

## 2018-04-28 NOTE — Telephone Encounter (Signed)
Per 12/18 patient decline avs and calendar

## 2018-09-02 ENCOUNTER — Inpatient Hospital Stay: Payer: Medicare HMO

## 2018-09-02 ENCOUNTER — Inpatient Hospital Stay: Payer: Medicare HMO | Admitting: Oncology

## 2018-09-14 ENCOUNTER — Telehealth: Payer: Self-pay | Admitting: Internal Medicine

## 2018-09-15 MED ORDER — ATENOLOL 50 MG PO TABS: 50.0000 mg | ORAL_TABLET | Freq: Two times a day (BID) | ORAL | 0 refills | Status: DC

## 2018-09-15 NOTE — Telephone Encounter (Signed)
Son caled to follow up on this refill request.  Advised him pt would need a virtual visit for refillls, as she has not been seen since 06/30/2017. Attemped to call the office several times, and I guess he hung up. Please call to schedule pt appt wit Dr Alain Marion for med refill

## 2018-09-15 NOTE — Telephone Encounter (Signed)
Spoke with pt's son to scheduled appt. RX sent to [facility] due to pt running out before appt.

## 2018-09-15 NOTE — Addendum Note (Signed)
Addended by: Terence Lux B on: 09/15/2018 01:41 PM   Modules accepted: Orders

## 2018-09-29 ENCOUNTER — Ambulatory Visit (INDEPENDENT_AMBULATORY_CARE_PROVIDER_SITE_OTHER): Payer: Medicare HMO | Admitting: Internal Medicine

## 2018-09-29 ENCOUNTER — Encounter: Payer: Self-pay | Admitting: Internal Medicine

## 2018-09-29 DIAGNOSIS — R413 Other amnesia: Secondary | ICD-10-CM

## 2018-09-29 DIAGNOSIS — I10 Essential (primary) hypertension: Secondary | ICD-10-CM | POA: Diagnosis not present

## 2018-09-29 DIAGNOSIS — C50812 Malignant neoplasm of overlapping sites of left female breast: Secondary | ICD-10-CM

## 2018-09-29 DIAGNOSIS — F0781 Postconcussional syndrome: Secondary | ICD-10-CM | POA: Diagnosis not present

## 2018-09-29 DIAGNOSIS — R5382 Chronic fatigue, unspecified: Secondary | ICD-10-CM | POA: Diagnosis not present

## 2018-09-29 DIAGNOSIS — R69 Illness, unspecified: Secondary | ICD-10-CM | POA: Diagnosis not present

## 2018-09-29 DIAGNOSIS — E559 Vitamin D deficiency, unspecified: Secondary | ICD-10-CM | POA: Diagnosis not present

## 2018-09-29 DIAGNOSIS — Z171 Estrogen receptor negative status [ER-]: Secondary | ICD-10-CM

## 2018-09-29 MED ORDER — ATENOLOL 50 MG PO TABS: ORAL_TABLET | ORAL | 3 refills | Status: DC

## 2018-09-29 MED ORDER — TRIAMTERENE-HCTZ 37.5-25 MG PO TABS: 1.0000 | ORAL_TABLET | Freq: Every day | ORAL | 3 refills | Status: DC

## 2018-09-29 NOTE — Assessment & Plan Note (Signed)
Rest more.  Reduce activity times.

## 2018-09-29 NOTE — Assessment & Plan Note (Signed)
Unchanged.  Rest more

## 2018-09-29 NOTE — Assessment & Plan Note (Signed)
On vitamin D

## 2018-09-29 NOTE — Assessment & Plan Note (Signed)
Follow-up with Dr. Jana Hakim

## 2018-09-29 NOTE — Assessment & Plan Note (Signed)
Stable  No change
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
## 2018-09-29 NOTE — Assessment & Plan Note (Signed)
Atenolol 100 mg in the morning and 50 mg at night. Added Maxide 37.5/25 mg daily.

## 2018-10-22 ENCOUNTER — Telehealth: Payer: Self-pay | Admitting: Oncology

## 2018-10-22 NOTE — Telephone Encounter (Signed)
Called patient regarding upcoming Webex appointment, patient will need interpreter at the time of the appointment but patient's son will set up Webex for patient.

## 2018-10-24 NOTE — Progress Notes (Signed)
We made several calls to Ms. is I have through her son's phone and left multiple messages.  They also called our nurse and left a message saying that they were trying to contact us.  I sent him an email and also opened a WebEx visit with his email.  Unfortunately all of that failed  Accordingly I am writing the patient a letter letting her know that I am scheduling her for a chest x-ray later this month.  She will then return to see me in November.

## 2018-10-25 ENCOUNTER — Encounter: Payer: Self-pay | Admitting: Oncology

## 2018-10-25 ENCOUNTER — Inpatient Hospital Stay: Payer: Medicare HMO

## 2018-10-25 ENCOUNTER — Inpatient Hospital Stay: Payer: Medicare HMO | Attending: Oncology | Admitting: Oncology

## 2018-10-25 DIAGNOSIS — Z853 Personal history of malignant neoplasm of breast: Secondary | ICD-10-CM

## 2018-10-25 DIAGNOSIS — Z171 Estrogen receptor negative status [ER-]: Secondary | ICD-10-CM

## 2018-10-25 DIAGNOSIS — I341 Nonrheumatic mitral (valve) prolapse: Secondary | ICD-10-CM

## 2018-10-25 DIAGNOSIS — C50812 Malignant neoplasm of overlapping sites of left female breast: Secondary | ICD-10-CM

## 2019-01-24 ENCOUNTER — Telehealth: Payer: Self-pay | Admitting: Internal Medicine

## 2019-01-24 NOTE — Telephone Encounter (Signed)
rx refill atenolol (TENORMIN) 50 MG tablet  PHARMACY [id number] - [place],  - [address] [place] [phone] (Phone) [phone] (Fax)

## 2019-01-25 MED ORDER — ATENOLOL 50 MG PO TABS: ORAL_TABLET | ORAL | 3 refills | Status: DC

## 2019-01-25 NOTE — Telephone Encounter (Signed)
RX sent

## 2019-10-15 IMAGING — CR DG CHEST 2V
2 series · 2 of 2 positions shown · non-contrast
Comparison: None.

CLINICAL DATA: History of breast carcinoma

EXAM:
CHEST - 2 VIEW

[w chest pa]
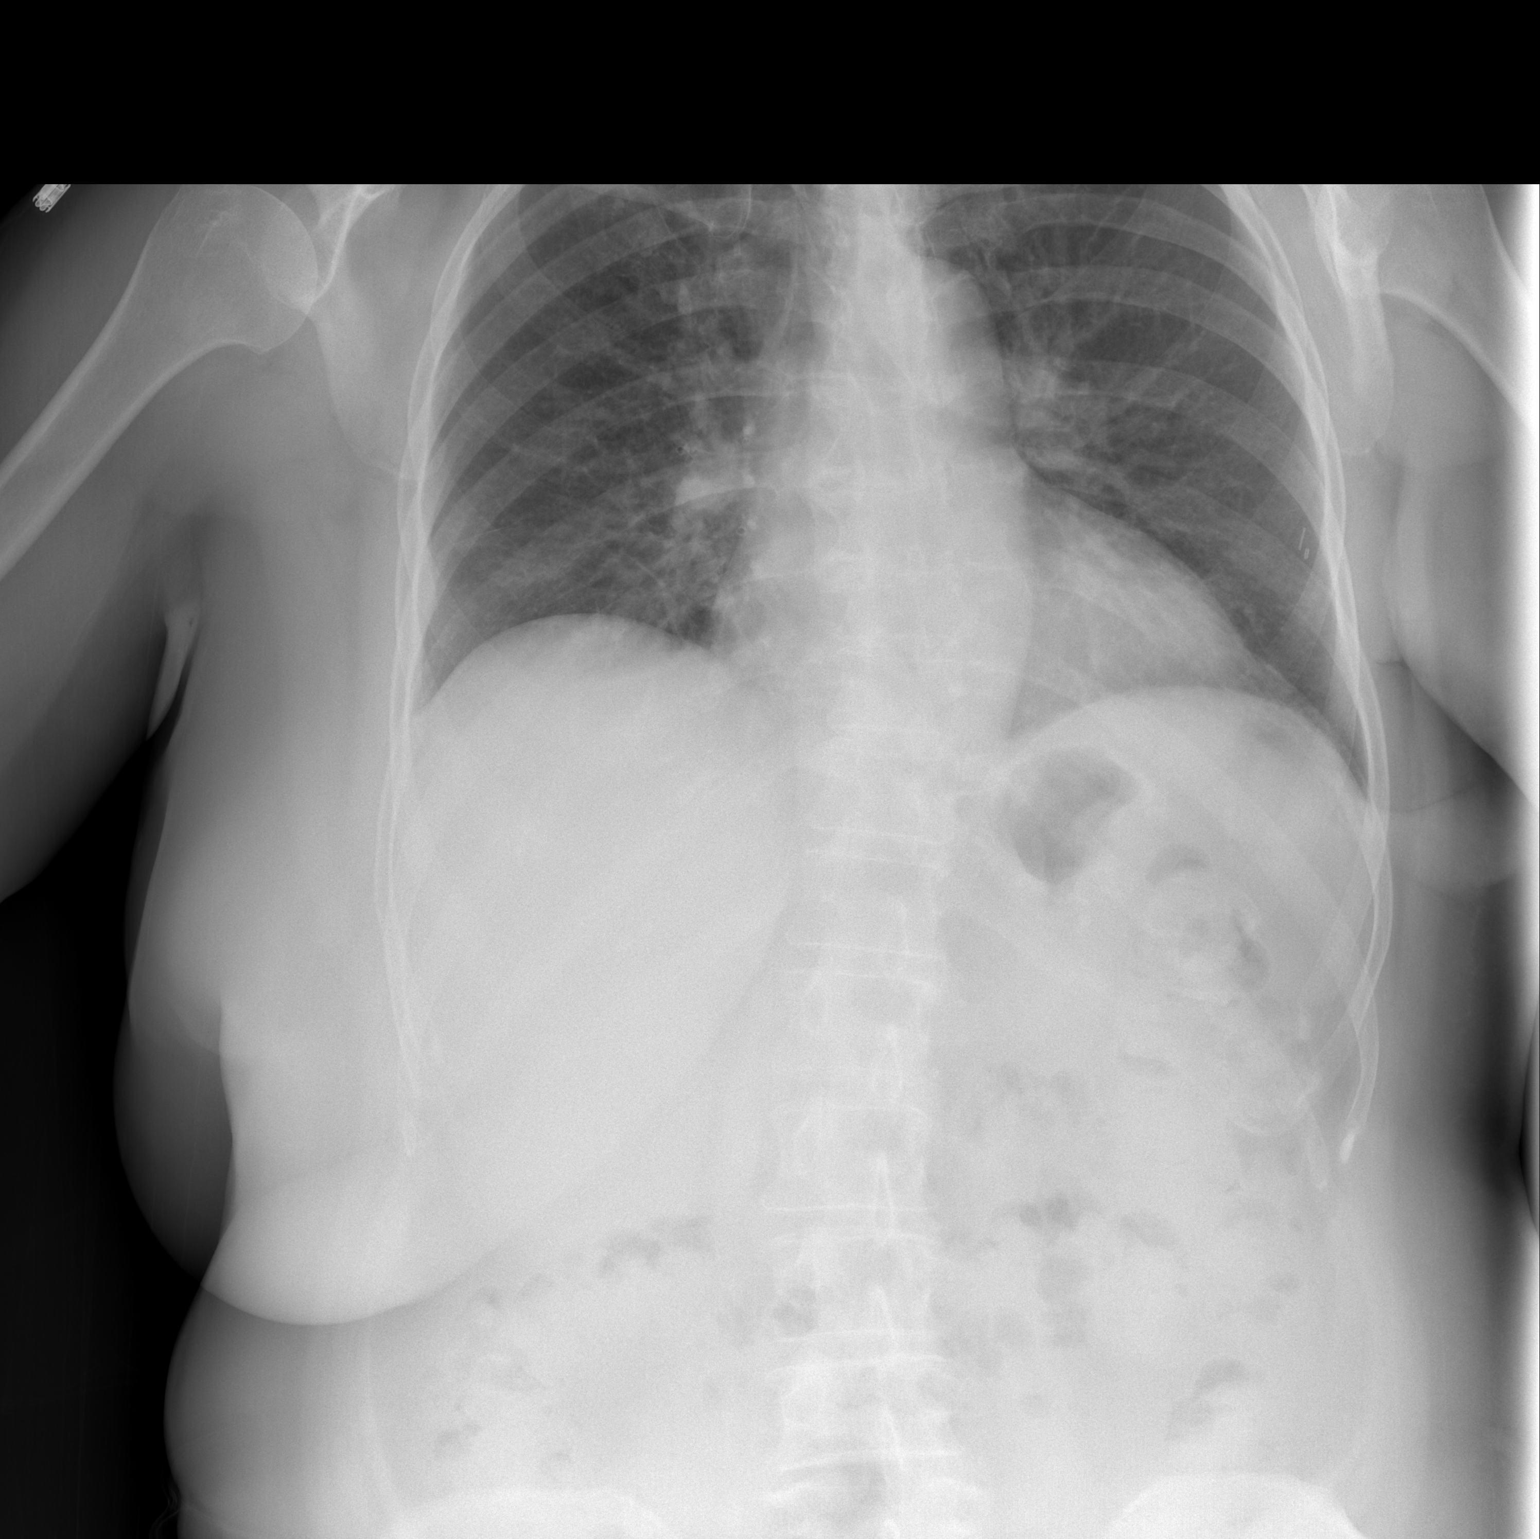

[w chest lat]
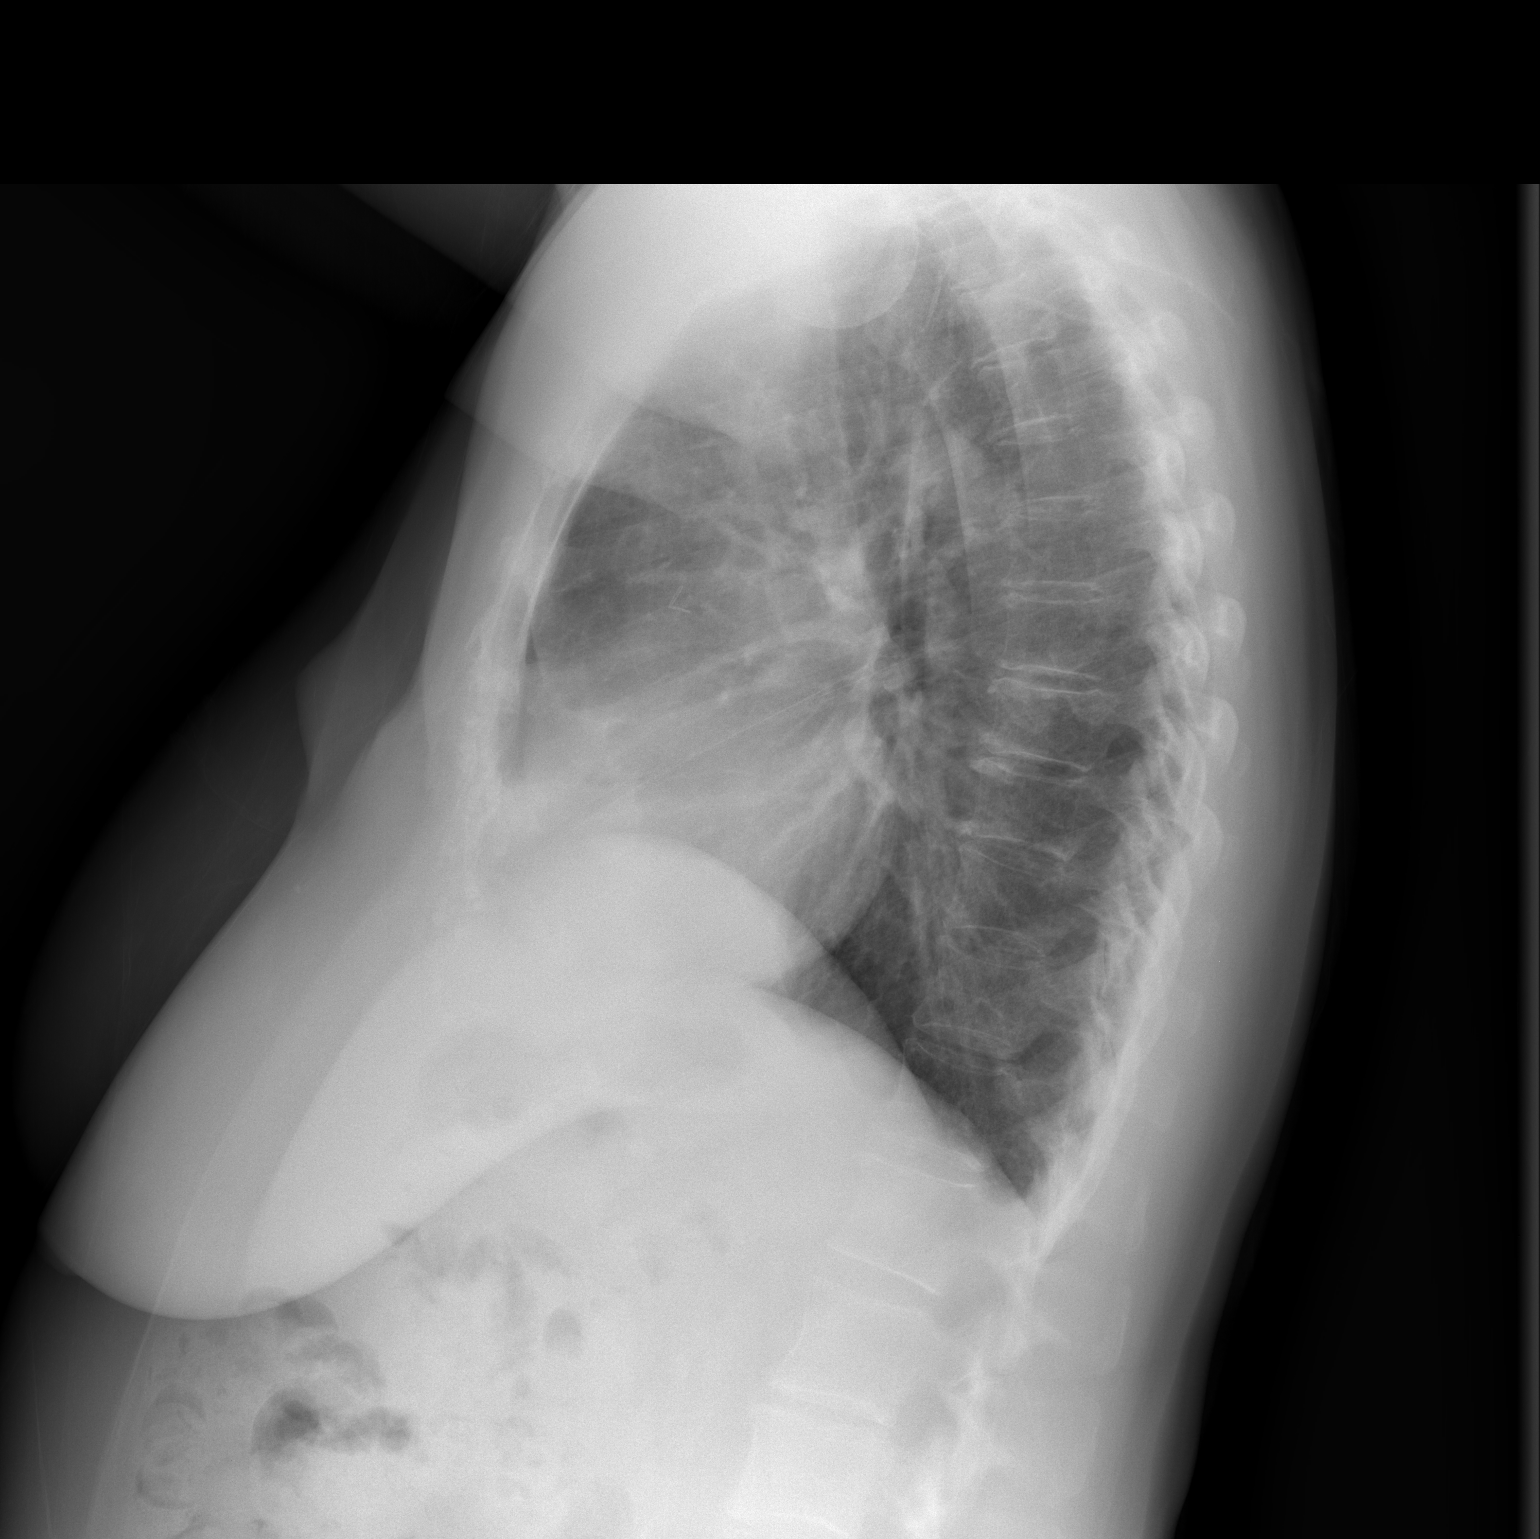

[2 of 2 positions shown; findings below may reference images not displayed]

FINDINGS: Cardiac shadow is within normal limits. The lungs are well aerated
bilaterally. No focal infiltrate or sizable effusion is seen. Mild
degenerative changes of the thoracic spine are noted.
IMPRESSION: No active cardiopulmonary disease.

## 2019-10-16 IMAGING — CT CT CHEST W/ CM
2 of 3 series · 15 of 36 positions shown, 18 images · IV contrast (omnipaque)
Comparison: None.

CLINICAL DATA: Malignant phyllodes tumor of the left breast. Recent
mastectomy.

EXAM:
CT CHEST WITH CONTRAST
TECHNIQUE: Multidetector CT imaging of the chest was performed during
intravenous contrast administration.
CONTRAST:  75mL OMNIPAQUE IOHEXOL 300 MG/ML  SOLN

[Series 2: axial st · axial · 0.76mm/px · z∈[+1325,+1579]mm · 12 of 149 slices shown, 15 images]
[im 11/149  mediastinal]
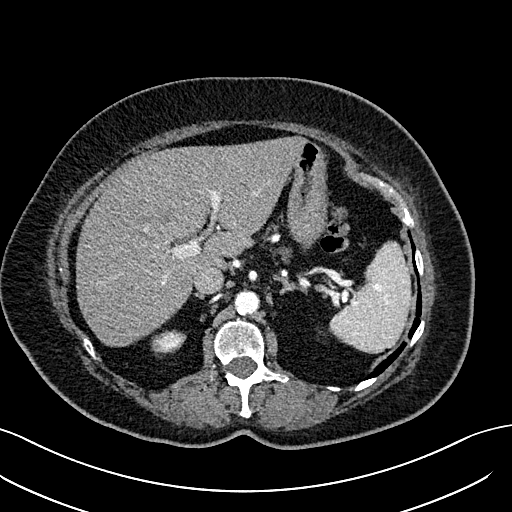
[im 11/149  lung]
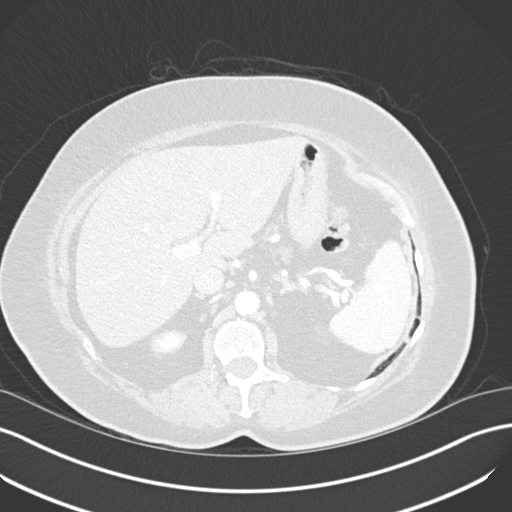
[im 22/149  lung]
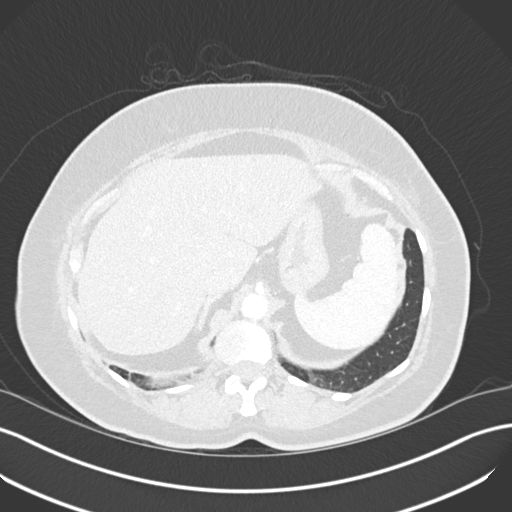
[im 33/149  lung]
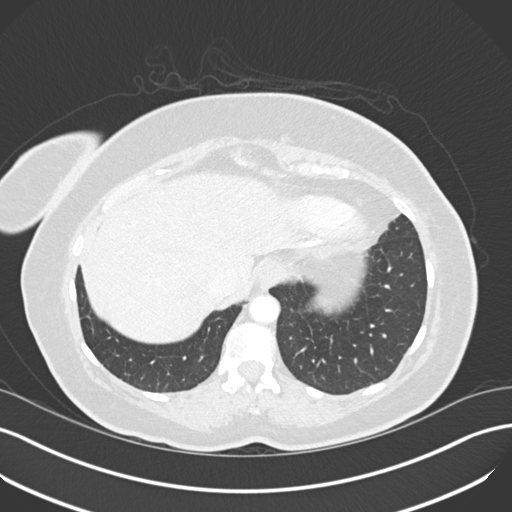
[im 44/149  lung]
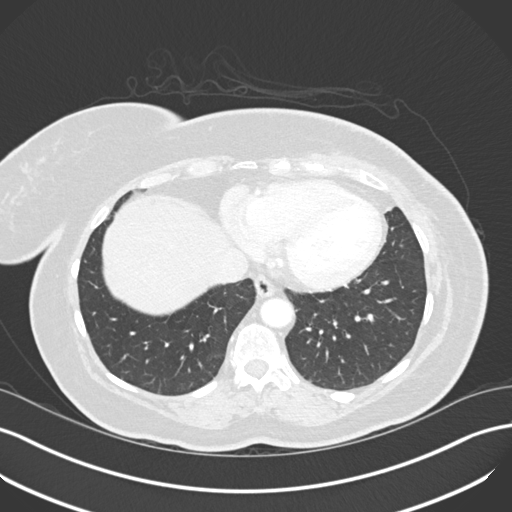
[im 55/149  mediastinal]
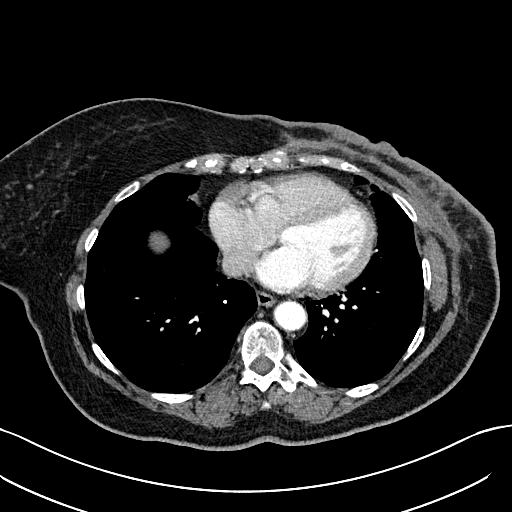
[im 55/149  lung]
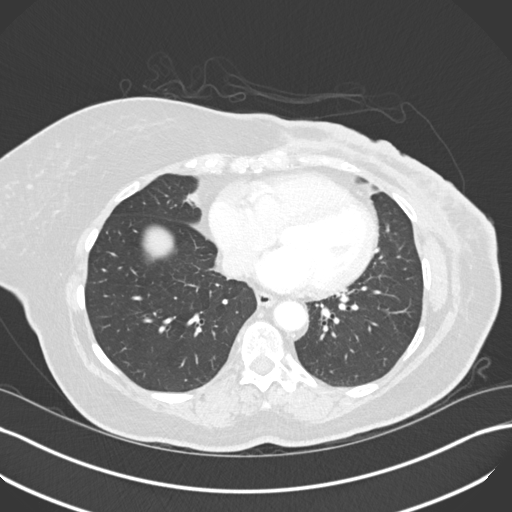
[im 66/149  lung]
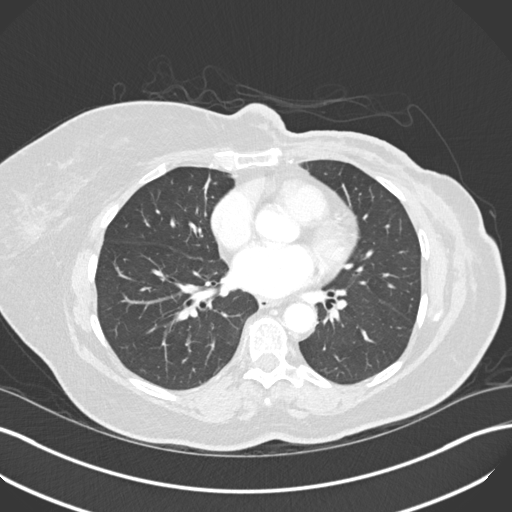
[im 83/149  lung]
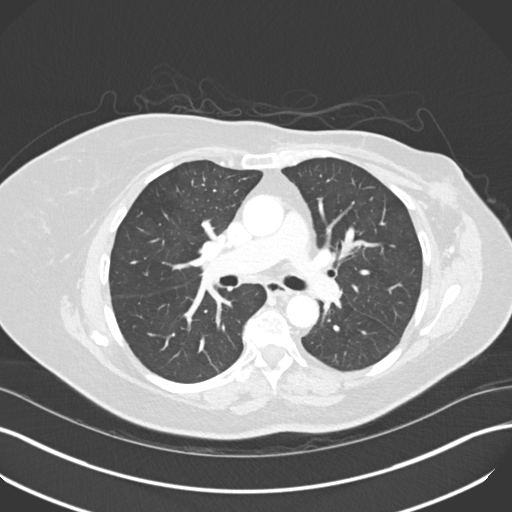
[im 94/149  lung]
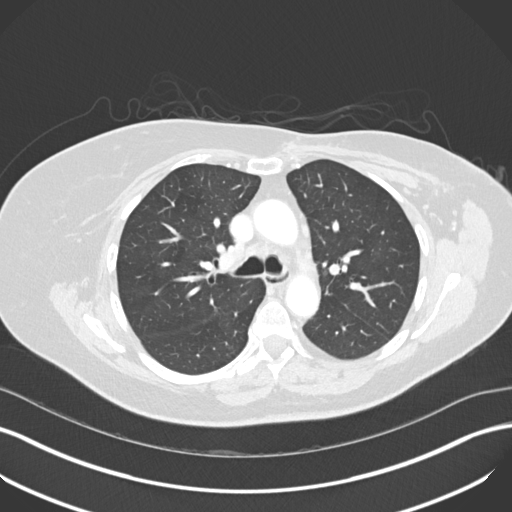
[im 105/149  mediastinal]
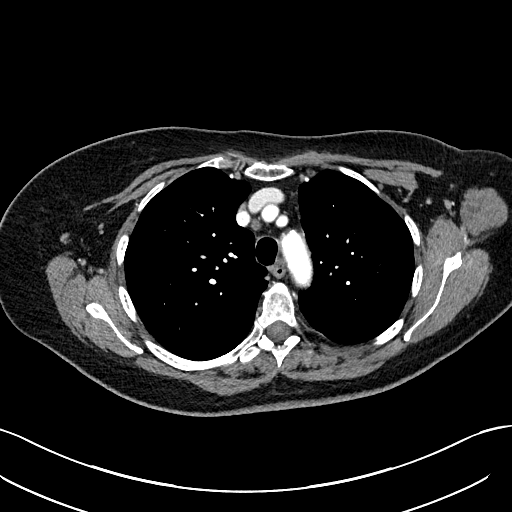
[im 105/149  lung]
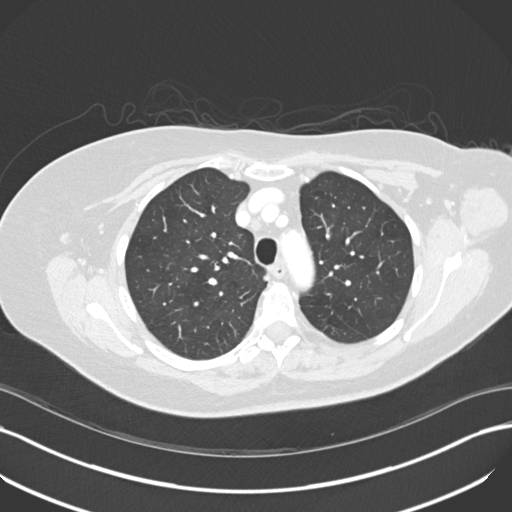
[im 116/149  lung]
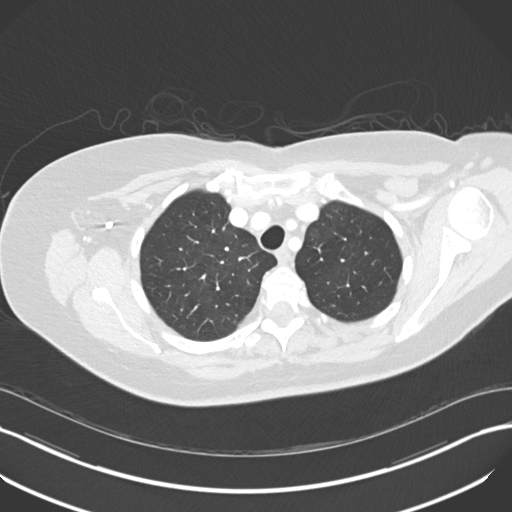
[im 127/149  lung]
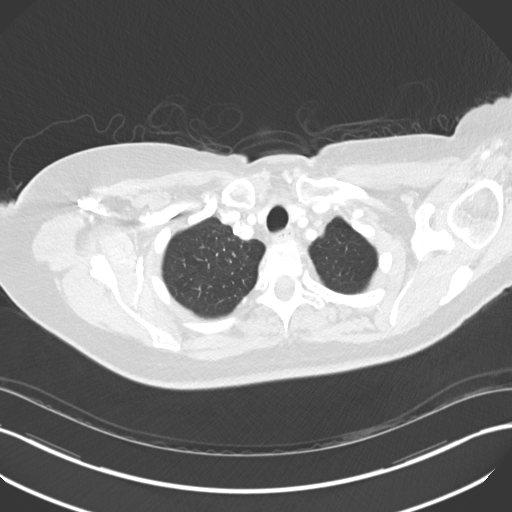
[im 138/149  lung]
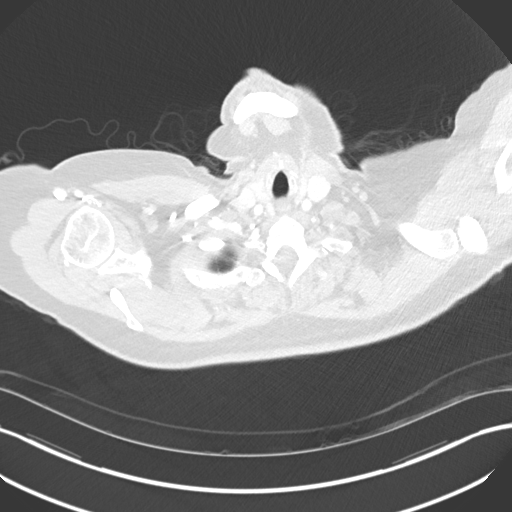

[Series 6: coronal · coronal · 0.62mm/px · 3 of 124 slices shown]
[im 25/124  lung]
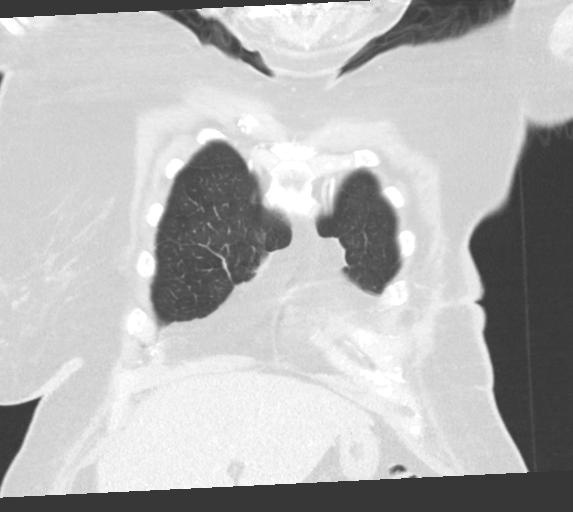
[im 50/124  lung]
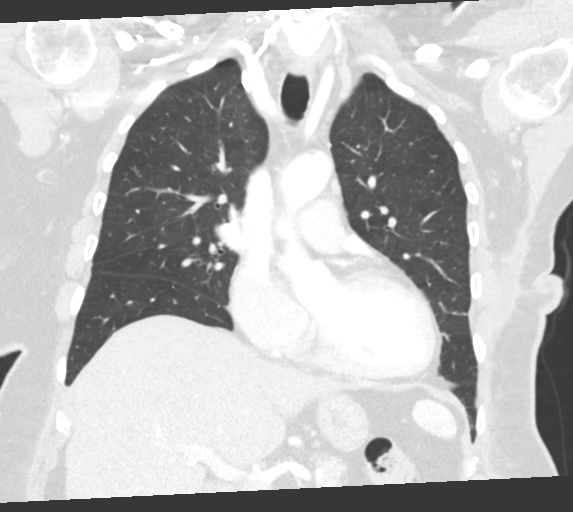
[im 74/124  lung]
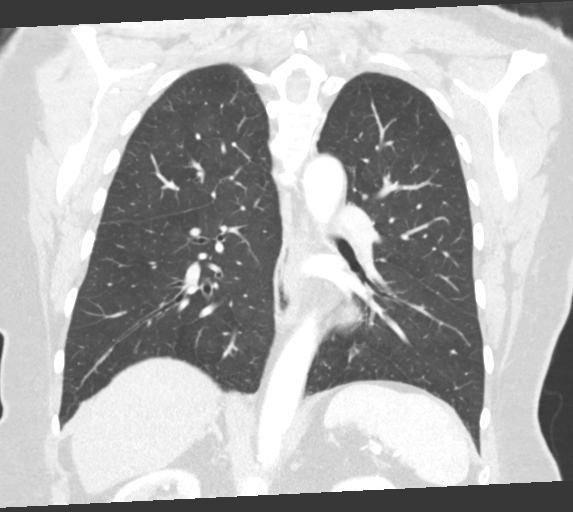

[15 of 36 positions shown; findings below may reference images not displayed]

FINDINGS: Cardiovascular: The heart is normal in size. No pericardial
effusion. The aorta is normal in caliber. No dissection. The branch
vessels are patent. No significant atherosclerotic calcifications.
No definite coronary artery calcifications.

Mediastinum/Nodes: Small scattered mediastinal lymph nodes none
measuring greater than 8 mm. The esophagus is grossly normal.

Lungs/Pleura: No findings suspicious for pulmonary metastatic
disease. 3 mm nodule in the right upper lobe is most likely benign.
No acute pulmonary findings. No pleural effusion or pleural nodules.

Upper Abdomen: No significant upper abdominal findings. No hepatic
or adrenal gland lesions are identified. No upper abdominal
adenopathy. Left renal artery calcification noted.

Chest wall/musculoskeletal: Extensive surgical changes involving the
left chest lady to a left mastectomy. Small elongated postoperative
fluid collection is noted along with marked nodular skin thickening
probably at the site of the scar. I do not see any obvious findings
for residual chest wall tumor. Evidence of left axillary lymph node
dissection with small postop fluid collections and small clips.

16 mm lesion in the left breast with an adjacent calcification or
clip. No worrisome right breast lesions. No axillary or
supraclavicular adenopathy bilaterally. Small scattered lymph nodes
are noted.

No significant bony findings.
IMPRESSION: 1. Expected surgical changes involving the left chest wall from a
recent mastectomy with postoperative fluid collections and skin
thickening and nodularity likely at the incision site. No findings
suspicious for residual or recurrent tumor on this baseline
postoperative chest CT.
2. Small scattered mediastinal and hilar lymph nodes but no mass or
overt adenopathy. Attention on follow-up is suggested.
3. No findings suspicious for pulmonary, upper abdominal or osseous
metastatic disease.

## 2020-04-23 ENCOUNTER — Other Ambulatory Visit: Payer: Self-pay | Admitting: Internal Medicine

## 2020-04-24 ENCOUNTER — Other Ambulatory Visit: Payer: Self-pay | Admitting: Internal Medicine

## 2020-04-24 MED ORDER — ATENOLOL 50 MG PO TABS: ORAL_TABLET | ORAL | 0 refills | Status: DC

## 2020-07-11 ENCOUNTER — Other Ambulatory Visit: Payer: Self-pay | Admitting: Internal Medicine

## 2020-07-22 ENCOUNTER — Other Ambulatory Visit: Payer: Self-pay

## 2020-07-23 MED ORDER — ATENOLOL 50 MG PO TABS: ORAL_TABLET | ORAL | 0 refills | Status: DC

## 2020-07-23 NOTE — Telephone Encounter (Signed)
Appointment scheduled Patient son advised short refill sent to pharmacy

## 2020-08-01 ENCOUNTER — Other Ambulatory Visit: Payer: Self-pay

## 2020-08-02 ENCOUNTER — Ambulatory Visit (INDEPENDENT_AMBULATORY_CARE_PROVIDER_SITE_OTHER): Payer: Medicare HMO | Admitting: Internal Medicine

## 2020-08-02 ENCOUNTER — Encounter: Payer: Self-pay | Admitting: Internal Medicine

## 2020-08-02 ENCOUNTER — Other Ambulatory Visit: Payer: Self-pay

## 2020-08-02 DIAGNOSIS — I10 Essential (primary) hypertension: Secondary | ICD-10-CM

## 2020-08-02 DIAGNOSIS — E785 Hyperlipidemia, unspecified: Secondary | ICD-10-CM | POA: Diagnosis not present

## 2020-08-02 DIAGNOSIS — E559 Vitamin D deficiency, unspecified: Secondary | ICD-10-CM

## 2020-08-02 DIAGNOSIS — L570 Actinic keratosis: Secondary | ICD-10-CM | POA: Insufficient documentation

## 2020-08-02 LAB — COMPREHENSIVE METABOLIC PANEL
ALT: 26 U/L (ref 0–35)
AST: 21 U/L (ref 0–37)
Albumin: 4.6 g/dL (ref 3.5–5.2)
Alkaline Phosphatase: 83 U/L (ref 39–117)
BUN: 13 mg/dL (ref 6–23)
CO2: 29 mEq/L (ref 19–32)
Calcium: 9.5 mg/dL (ref 8.4–10.5)
Chloride: 103 mEq/L (ref 96–112)
Creatinine, Ser: 0.83 mg/dL (ref 0.40–1.20)
GFR: 68.73 mL/min (ref >60.00)
Glucose, Bld: 124 mg/dL — ABNORMAL HIGH (ref 70–99)
Potassium: 4.7 mEq/L (ref 3.5–5.1)
Sodium: 139 mEq/L (ref 135–145)
Total Bilirubin: 0.6 mg/dL (ref 0.2–1.2)
Total Protein: 7.4 g/dL (ref 6.0–8.3)

## 2020-08-02 LAB — URINALYSIS
Bilirubin Urine: NEGATIVE
Hgb urine dipstick: NEGATIVE
Ketones, ur: NEGATIVE
Leukocytes,Ua: NEGATIVE
Nitrite: NEGATIVE
Specific Gravity, Urine: <=1.005 — AB (ref 1.000–1.030)
Total Protein, Urine: NEGATIVE
Urine Glucose: NEGATIVE
Urobilinogen, UA: 0.2 (ref 0.0–1.0)
pH: 7.5 (ref 5.0–8.0)

## 2020-08-02 LAB — CBC WITH DIFFERENTIAL/PLATELET
Basophils Absolute: 0.1 10*3/uL (ref 0.0–0.1)
Basophils Relative: 0.8 % (ref 0.0–3.0)
Eosinophils Absolute: 0.2 10*3/uL (ref 0.0–0.7)
Eosinophils Relative: 2.6 % (ref 0.0–5.0)
HCT: 39.8 % (ref 36.0–46.0)
Hemoglobin: 13.2 g/dL (ref 12.0–15.0)
Lymphocytes Relative: 15.2 % (ref 12.0–46.0)
Lymphs Abs: 1.1 10*3/uL (ref 0.7–4.0)
MCHC: 33.1 g/dL (ref 30.0–36.0)
MCV: 86 fl (ref 78.0–100.0)
Monocytes Absolute: 0.6 10*3/uL (ref 0.1–1.0)
Monocytes Relative: 8.4 % (ref 3.0–12.0)
Neutro Abs: 5.5 10*3/uL (ref 1.4–7.7)
Neutrophils Relative %: 73 % (ref 43.0–77.0)
Platelets: 284 10*3/uL (ref 150.0–400.0)
RBC: 4.63 Mil/uL (ref 3.87–5.11)
RDW: 14.7 % (ref 11.5–15.5)
WBC: 7.5 10*3/uL (ref 4.0–10.5)

## 2020-08-02 LAB — LIPID PANEL
Cholesterol: 270 mg/dL — ABNORMAL HIGH (ref 0–200)
HDL: 53.5 mg/dL (ref >39.00)
LDL Cholesterol: 182 mg/dL — ABNORMAL HIGH (ref 0–99)
NonHDL: 216.28
Total CHOL/HDL Ratio: 5
Triglycerides: 173 mg/dL — ABNORMAL HIGH (ref 0.0–149.0)
VLDL: 34.6 mg/dL (ref 0.0–40.0)

## 2020-08-02 LAB — TSH: TSH: 2.17 u[IU]/mL (ref 0.35–4.50)

## 2020-08-02 MED ORDER — ATENOLOL 100 MG PO TABS: 100.0000 mg | ORAL_TABLET | Freq: Every day | ORAL | 3 refills | Status: DC

## 2020-08-02 MED ORDER — MEGARED OMEGA-3 KRILL OIL 500 MG PO CAPS: 1.0000 | ORAL_CAPSULE | Freq: Every morning | ORAL | 3 refills | Status: DC

## 2020-08-02 MED ORDER — ATENOLOL 100 MG PO TABS: 100.0000 mg | ORAL_TABLET | Freq: Two times a day (BID) | ORAL | 3 refills | Status: DC

## 2020-08-02 MED ORDER — OLMESARTAN MEDOXOMIL-HCTZ 40-25 MG PO TABS: 1.0000 | ORAL_TABLET | Freq: Every day | ORAL | 3 refills | Status: DC

## 2020-08-02 NOTE — Patient Instructions (Signed)
   Postprocedure instructions :     Keep the wounds clean. You can wash them with liquid soap and water. Pat dry with gauze or a Kleenex tissue  Before applying antibiotic ointment and a Band-Aid.   You need to report immediately  if  any signs of infection develop.    

## 2020-08-02 NOTE — Assessment & Plan Note (Signed)
On Vit D 

## 2020-08-02 NOTE — Progress Notes (Signed)
Subjective:  Patient ID: Robin Estrada, female    DOB: 1945/06/03  Age: 75 y.o. MRN: [id number]  CC: Annual Exam   HPI Delanie Tirrell presents for HTN, breast cancer, skin lesion Not taking Losartan, Maxzide  Outpatient Medications Prior to Visit  Medication Sig Dispense Refill  . acetaminophen (TYLENOL) 500 MG tablet Take 1,000 mg by mouth every 6 (six) hours as needed for headache.    Marland Kitchen aspirin 81 MG tablet Take 1 tablet (81 mg total) by mouth 2 (two) times a week. 30 tablet   . atenolol (TENORMIN) 50 MG tablet 100 mg in am and 50 mg at hs 45 tablet 0  . cholecalciferol (VITAMIN D) 1000 units tablet Take 1 tablet (1,000 Units total) by mouth daily. 100 tablet 3  . ibuprofen (ADVIL,MOTRIN) 200 MG tablet Take 200 mg by mouth every 6 (six) hours as needed for headache.    . vitamin C (ASCORBIC ACID) 500 MG tablet Take 1 tablet (500 mg total) by mouth every other day.    . butalbital-acetaminophen-caffeine (FIORICET) 50-325-40 MG tablet Take 1-2 tablets by mouth 2 (two) times daily as needed for headache. (Patient not taking: Reported on 08/02/2020) 60 tablet 1  . losartan (COZAAR) 50 MG tablet Take 1 tablet (50 mg total) by mouth daily. (Patient not taking: Reported on 08/02/2020) 30 tablet 11  . triamterene-hydrochlorothiazide (MAXZIDE-25) 37.5-25 MG tablet Take 1 tablet by mouth daily. 90 tablet 3   No facility-administered medications prior to visit.    ROS: Review of Systems  Constitutional: Positive for fatigue. Negative for activity change, appetite change, chills and unexpected weight change.  HENT: Negative for congestion, mouth sores and sinus pressure.   Eyes: Negative for visual disturbance.  Respiratory: Negative for cough and chest tightness.   Gastrointestinal: Negative for abdominal pain and nausea.  Genitourinary: Negative for difficulty urinating, frequency and vaginal pain.  Musculoskeletal: Negative for back pain and gait problem.  Skin: Negative for pallor and  rash.  Neurological: Positive for headaches. Negative for dizziness, tremors, weakness and numbness.  Psychiatric/Behavioral: Negative for confusion and sleep disturbance.    Objective:  BP (!) 182/104 (BP Location: Right Arm)   Pulse 69   Temp 97.7 F (36.5 C) (Oral)   Ht 5' (1.524 m)   Wt 178 lb 3.2 oz (80.8 kg)   SpO2 95%   BMI 34.80 kg/m   BP Readings from Last 3 Encounters:  08/02/20 (!) 182/104  04/28/18 (!) 168/76  02/15/18 (!) 188/83    Wt Readings from Last 3 Encounters:  08/02/20 178 lb 3.2 oz (80.8 kg)  04/28/18 169 lb 4.8 oz (76.8 kg)  02/15/18 170 lb 3.2 oz (77.2 kg)    Physical Exam Constitutional:      General: She is not in acute distress.    Appearance: She is well-developed. She is obese.  HENT:     Head: Normocephalic.     Right Ear: External ear normal.     Left Ear: External ear normal.     Nose: Nose normal.  Eyes:     General:        Right eye: No discharge.        Left eye: No discharge.     Conjunctiva/sclera: Conjunctivae normal.     Pupils: Pupils are equal, round, and reactive to light.  Neck:     Thyroid: No thyromegaly.     Vascular: No JVD.     Trachea: No tracheal deviation.  Cardiovascular:  Rate and Rhythm: Normal rate and regular rhythm.     Heart sounds: Normal heart sounds.  Pulmonary:     Effort: No respiratory distress.     Breath sounds: No stridor. No wheezing.  Abdominal:     General: Bowel sounds are normal. There is no distension.     Palpations: Abdomen is soft. There is no mass.     Tenderness: There is no abdominal tenderness. There is no guarding or rebound.  Musculoskeletal:        General: No tenderness.     Cervical back: Normal range of motion and neck supple.  Lymphadenopathy:     Cervical: No cervical adenopathy.  Skin:    Findings: No erythema or rash.  Neurological:     Mental Status: She is oriented to person, place, and time.     Cranial Nerves: No cranial nerve deficit.     Motor: No  abnormal muscle tone.     Coordination: Coordination normal.     Gait: Gait normal.     Deep Tendon Reflexes: Reflexes normal.  Psychiatric:        Behavior: Behavior normal.        Thought Content: Thought content normal.        Judgment: Judgment normal.    AK - forehead   Procedure Note :     Procedure : Cryosurgery   Indication: Actinic keratosis(es)   Risks including unsuccessful procedure , bleeding, infection, bruising, scar, a need for a repeat  procedure and others were explained to the patient in detail as well as the benefits. Informed consent was obtained verbally.   1  lesion(s)  on forehead   was/were treated with liquid nitrogen on a Q-tip in a usual fasion . Band-Aid was applied and antibiotic ointment was given for a later use.   Tolerated well. Complications none.   Postprocedure instructions :     Keep the wounds clean. You can wash them with liquid soap and water. Pat dry with gauze or a Kleenex tissue  Before applying antibiotic ointment and a Band-Aid.   You need to report immediately  if  any signs of infection develop.    Lab Results  Component Value Date   WBC 5.9 04/28/2018   HGB 12.3 04/28/2018   HCT 38.6 04/28/2018   PLT 236 04/28/2018   GLUCOSE 105 (H) 04/28/2018   CHOL 239 (H) 05/22/2017   TRIG 131.0 05/22/2017   HDL 53.20 05/22/2017   LDLCALC 160 (H) 05/22/2017   ALT 22 04/28/2018   AST 22 04/28/2018   NA 141 04/28/2018   K 4.3 04/28/2018   CL 105 04/28/2018   CREATININE 0.84 04/28/2018   BUN 17 04/28/2018   CO2 25 04/28/2018   TSH 2.45 05/22/2017   INR 0.98 09/14/2017   HGBA1C 6.4 05/22/2017    No results found.  Assessment & Plan:     Follow-up: No follow-ups on file.  Walker Kehr, MD

## 2020-08-02 NOTE — Assessment & Plan Note (Signed)
Risks associated with treatment noncompliance were discussed. Compliance was encouraged. Start Olmesartan HCT Cont Atenolol

## 2020-08-02 NOTE — Assessment & Plan Note (Signed)
Krill oil

## 2020-08-02 NOTE — Assessment & Plan Note (Addendum)
Forehead Please see procedure

## 2020-08-30 ENCOUNTER — Other Ambulatory Visit: Payer: Self-pay

## 2020-08-30 ENCOUNTER — Encounter: Payer: Self-pay | Admitting: Internal Medicine

## 2020-08-30 ENCOUNTER — Ambulatory Visit (INDEPENDENT_AMBULATORY_CARE_PROVIDER_SITE_OTHER): Payer: Medicare HMO | Admitting: Internal Medicine

## 2020-08-30 DIAGNOSIS — R739 Hyperglycemia, unspecified: Secondary | ICD-10-CM | POA: Insufficient documentation

## 2020-08-30 DIAGNOSIS — E559 Vitamin D deficiency, unspecified: Secondary | ICD-10-CM

## 2020-08-30 DIAGNOSIS — I10 Essential (primary) hypertension: Secondary | ICD-10-CM

## 2020-08-30 LAB — BASIC METABOLIC PANEL
BUN: 12 mg/dL (ref 6–23)
CO2: 31 mEq/L (ref 19–32)
Calcium: 9.8 mg/dL (ref 8.4–10.5)
Chloride: 102 mEq/L (ref 96–112)
Creatinine, Ser: 0.83 mg/dL (ref 0.40–1.20)
GFR: 68.69 mL/min (ref >60.00)
Glucose, Bld: 122 mg/dL — ABNORMAL HIGH (ref 70–99)
Potassium: 4.8 mEq/L (ref 3.5–5.1)
Sodium: 140 mEq/L (ref 135–145)

## 2020-08-30 LAB — CORTISOL: Cortisol, Plasma: 8.5 ug/dL

## 2020-08-30 LAB — HEMOGLOBIN A1C: Hgb A1c MFr Bld: 6.4 % (ref 4.6–6.5)

## 2020-08-30 LAB — MAGNESIUM: Magnesium: 2.1 mg/dL (ref 1.5–2.5)

## 2020-08-30 MED ORDER — AMLODIPINE BESYLATE 2.5 MG PO TABS: 2.5000 mg | ORAL_TABLET | Freq: Every day | ORAL | 3 refills | Status: DC

## 2020-08-30 MED ORDER — TRIAMTERENE-HCTZ 37.5-25 MG PO TABS: 1.0000 | ORAL_TABLET | Freq: Every day | ORAL | 3 refills | Status: DC

## 2020-08-30 NOTE — Assessment & Plan Note (Signed)
On Vit D 

## 2020-08-30 NOTE — Assessment & Plan Note (Signed)
Heck A1c

## 2020-08-30 NOTE — Assessment & Plan Note (Signed)
Poor compliance

## 2020-08-30 NOTE — Progress Notes (Signed)
   Subjective:  Patient ID: Robin Estrada, female    DOB: 1945-08-07  Age: 75 y.o. MRN: [id number]  CC: Follow-up (4 week f/u)   HPI Robin Estrada presents for HTN. Not taking Losartan  Outpatient Medications Prior to Visit  Medication Sig Dispense Refill  . acetaminophen (TYLENOL) 500 MG tablet Take 1,000 mg by mouth every 6 (six) hours as needed for headache.    Marland Kitchen aspirin 81 MG tablet Take 1 tablet (81 mg total) by mouth 2 (two) times a week. 30 tablet   . atenolol (TENORMIN) 100 MG tablet Take 1 tablet (100 mg total) by mouth 2 (two) times daily. 180 tablet 3  . cholecalciferol (VITAMIN D) 1000 units tablet Take 1 tablet (1,000 Units total) by mouth daily. 100 tablet 3  . MegaRed Omega-3 Krill Oil 500 MG CAPS Take 1 capsule by mouth every morning. 100 capsule 3  . olmesartan-hydrochlorothiazide (BENICAR HCT) 40-25 MG tablet Take 1 tablet by mouth daily. 90 tablet 3  . vitamin C (ASCORBIC ACID) 500 MG tablet Take 1 tablet (500 mg total) by mouth every other day.     No facility-administered medications prior to visit.    ROS: Review of Systems  Constitutional: Negative for activity change, appetite change, chills, fatigue and unexpected weight change.  HENT: Negative for congestion, mouth sores and sinus pressure.   Eyes: Negative for visual disturbance.  Respiratory: Negative for cough and chest tightness.   Gastrointestinal: Negative for abdominal pain and nausea.  Genitourinary: Negative for difficulty urinating, frequency and vaginal pain.  Musculoskeletal: Negative for back pain and gait problem.  Skin: Negative for pallor and rash.  Neurological: Negative for dizziness, tremors, weakness, numbness and headaches.  Psychiatric/Behavioral: Negative for confusion and sleep disturbance.    Objective:  BP (!) 162/88 (BP Location: Left Arm)   Pulse 67   Temp 98.3 F (36.8 C) (Oral)   Wt 177 lb 9.6 oz (80.6 kg)   SpO2 96%   BMI 34.69 kg/m   BP Readings from Last 3  Encounters:  08/30/20 (!) 162/88  08/02/20 (!) 182/104  04/28/18 (!) 168/76    Wt Readings from Last 3 Encounters:  08/30/20 177 lb 9.6 oz (80.6 kg)  08/02/20 178 lb 3.2 oz (80.8 kg)  04/28/18 169 lb 4.8 oz (76.8 kg)    Physical Exam  Lab Results  Component Value Date   WBC 7.5 08/02/2020   HGB 13.2 08/02/2020   HCT 39.8 08/02/2020   PLT 284.0 08/02/2020   GLUCOSE 124 (H) 08/02/2020   CHOL 270 (H) 08/02/2020   TRIG 173.0 (H) 08/02/2020   HDL 53.50 08/02/2020   LDLCALC 182 (H) 08/02/2020   ALT 26 08/02/2020   AST 21 08/02/2020   NA 139 08/02/2020   K 4.7 08/02/2020   CL 103 08/02/2020   CREATININE 0.83 08/02/2020   BUN 13 08/02/2020   CO2 29 08/02/2020   TSH 2.17 08/02/2020   INR 0.98 09/14/2017   HGBA1C 6.4 05/22/2017    No results found.  Assessment & Plan:   There are no diagnoses linked to this encounter.   No orders of the defined types were placed in this encounter.    Follow-up: No follow-ups on file.  Walker Kehr, MD

## 2020-11-29 ENCOUNTER — Encounter: Payer: Self-pay | Admitting: Internal Medicine

## 2020-11-29 ENCOUNTER — Ambulatory Visit (INDEPENDENT_AMBULATORY_CARE_PROVIDER_SITE_OTHER): Payer: Medicare HMO | Admitting: Internal Medicine

## 2020-11-29 ENCOUNTER — Other Ambulatory Visit: Payer: Self-pay

## 2020-11-29 DIAGNOSIS — Z171 Estrogen receptor negative status [ER-]: Secondary | ICD-10-CM

## 2020-11-29 DIAGNOSIS — R413 Other amnesia: Secondary | ICD-10-CM

## 2020-11-29 DIAGNOSIS — E559 Vitamin D deficiency, unspecified: Secondary | ICD-10-CM

## 2020-11-29 DIAGNOSIS — I10 Essential (primary) hypertension: Secondary | ICD-10-CM | POA: Diagnosis not present

## 2020-11-29 DIAGNOSIS — C50812 Malignant neoplasm of overlapping sites of left female breast: Secondary | ICD-10-CM

## 2020-11-29 MED ORDER — CLONIDINE HCL 0.1 MG PO TABS: 0.1000 mg | ORAL_TABLET | Freq: Three times a day (TID) | ORAL | 3 refills | Status: DC | PRN

## 2020-11-29 NOTE — Assessment & Plan Note (Addendum)
Better On Lion's mane

## 2020-11-29 NOTE — Assessment & Plan Note (Signed)
On Vit D 

## 2020-11-29 NOTE — Progress Notes (Signed)
Subjective:  Patient ID: Robin Estrada, female    DOB: 1945-07-26  Age: 75 y.o. 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  Note that slides from this biopsy were reviewed by Dr Emmaline Kluver at [facility].  An extensive panel of immunohistochemistry was performed.  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    Outpatient Medications Prior to Visit  Medication Sig Dispense Refill   acetaminophen (TYLENOL) 500 MG tablet Take 1,000 mg by mouth every 6 (six) hours as needed for headache.     aspirin 81 MG tablet Take 1 tablet (81 mg total) by mouth 2 (two) times a week. 30 tablet    atenolol (TENORMIN) 100 MG tablet Take 1 tablet (100 mg total) by mouth 2 (two) times daily. 180 tablet 3   cholecalciferol (VITAMIN D) 1000 units tablet Take 1 tablet (1,000 Units total) by mouth daily. 100 tablet 3   MegaRed Omega-3 Krill Oil 500 MG CAPS Take 1 capsule by mouth every morning. 100 capsule 3   triamterene-hydrochlorothiazide (MAXZIDE-25) 37.5-25 MG tablet Take 1 tablet by mouth daily. 90 tablet 3   vitamin C (ASCORBIC ACID) 500 MG tablet Take 1 tablet (500 mg total) by mouth every other day.     amLODipine (NORVASC) 2.5 MG tablet Take 1 tablet (2.5 mg total) by mouth daily. (Patient not taking: Reported on 11/29/2020) 90 tablet 3   No facility-administered medications prior to visit.    ROS: Review of Systems  Constitutional:   Negative for activity change, appetite change, chills, fatigue and unexpected weight change.  HENT:  Negative for congestion, mouth sores and sinus pressure.   Eyes:  Negative for visual disturbance.  Respiratory:  Negative for cough and chest tightness.   Gastrointestinal:  Negative for abdominal pain and nausea.  Genitourinary:  Negative for difficulty urinating, frequency and vaginal pain.  Musculoskeletal:  Negative for back pain and gait problem.  Skin:  Negative for pallor and rash.  Neurological:  Negative for dizziness, tremors, weakness, numbness and headaches.  Psychiatric/Behavioral:  Negative for confusion and sleep disturbance. The patient is not nervous/anxious.    Objective:  BP (!) 168/78 (BP Location: Left Arm)   Pulse 71   Temp 97.8 F (36.6 C) (Oral)   Ht 5' (1.524 m)   Wt 171 lb (77.6 kg)   SpO2 98%   BMI 33.40 kg/m   BP Readings from Last 3 Encounters:  11/29/20 (!) 168/78  08/30/20 (!) 162/88  08/02/20 (!) 182/104    Wt Readings from Last 3 Encounters:  11/29/20 171 lb (77.6 kg)  08/30/20 177 lb 9.6 oz (80.6 kg)  08/02/20 178 lb 3.2 oz (80.8 kg)    Physical Exam Constitutional:      General: She is not in acute distress.    Appearance: She is well-developed.  HENT:     Head: Normocephalic.     Right Ear: External ear normal.     Left  Ear: External ear normal.     Nose: Nose normal.  Eyes:     General:        Right eye: No discharge.        Left eye: No discharge.     Conjunctiva/sclera: Conjunctivae normal.     Pupils: Pupils are equal, round, and reactive to light.  Neck:     Thyroid: No thyromegaly.     Vascular: No JVD.     Trachea: No tracheal deviation.  Cardiovascular:     Rate and Rhythm: Normal rate and regular rhythm.     Heart sounds: Normal heart sounds.  Pulmonary:     Effort: No respiratory distress.     Breath sounds: No stridor. No wheezing.  Abdominal:     General: Bowel sounds are normal. There is no distension.      Palpations: Abdomen is soft. There is no mass.     Tenderness: There is no abdominal tenderness. There is no guarding or rebound.  Musculoskeletal:        General: No tenderness.     Cervical back: Normal range of motion and neck supple. No rigidity.  Lymphadenopathy:     Cervical: No cervical adenopathy.  Skin:    Findings: No erythema or rash.  Neurological:     Cranial Nerves: No cranial nerve deficit.     Motor: No abnormal muscle tone.     Coordination: Coordination normal.     Deep Tendon Reflexes: Reflexes normal.  Psychiatric:        Behavior: Behavior normal.        Thought Content: Thought content normal.        Judgment: Judgment normal.    Lab Results  Component Value Date   WBC 7.5 08/02/2020   HGB 13.2 08/02/2020   HCT 39.8 08/02/2020   PLT 284.0 08/02/2020   GLUCOSE 122 (H) 08/30/2020   CHOL 270 (H) 08/02/2020   TRIG 173.0 (H) 08/02/2020   HDL 53.50 08/02/2020   LDLCALC 182 (H) 08/02/2020   ALT 26 08/02/2020   AST 21 08/02/2020   NA 140 08/30/2020   K 4.8 08/30/2020   CL 102 08/30/2020   CREATININE 0.83 08/30/2020   BUN 12 08/30/2020   CO2 31 08/30/2020   TSH 2.17 08/02/2020   INR 0.98 09/14/2017   HGBA1C 6.4 08/30/2020    No results found.  Assessment & Plan:    Walker Kehr, MD

## 2020-11-29 NOTE — Assessment & Plan Note (Addendum)
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  Note that slides from this biopsy were reviewed by Dr Emmaline Kluver at [facility].  An extensive panel of immunohistochemistry was performed.  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  The pt refused mammogram, oncology f/u etc  Risks associated with diagnostic and follow up noncompliance were discussed. Compliance was encouraged.

## 2020-11-29 NOTE — Assessment & Plan Note (Signed)
Pt is on Atenolol 100 mg bid Pt is taking Maxzide once a week  Start Clonidine prn

## 2021-02-26 DIAGNOSIS — H40053 Ocular hypertension, bilateral: Secondary | ICD-10-CM | POA: Diagnosis not present

## 2021-02-26 DIAGNOSIS — H353131 Nonexudative age-related macular degeneration, bilateral, early dry stage: Secondary | ICD-10-CM | POA: Diagnosis not present

## 2021-03-11 ENCOUNTER — Telehealth (INDEPENDENT_AMBULATORY_CARE_PROVIDER_SITE_OTHER): Payer: Medicare HMO | Admitting: Internal Medicine

## 2021-03-11 ENCOUNTER — Encounter: Payer: Self-pay | Admitting: Internal Medicine

## 2021-03-11 DIAGNOSIS — R059 Cough, unspecified: Secondary | ICD-10-CM | POA: Insufficient documentation

## 2021-03-11 DIAGNOSIS — I10 Essential (primary) hypertension: Secondary | ICD-10-CM | POA: Diagnosis not present

## 2021-03-11 DIAGNOSIS — R5382 Chronic fatigue, unspecified: Secondary | ICD-10-CM

## 2021-03-11 MED ORDER — CLONIDINE 0.1 MG/24HR TD PTWK: 0.1000 mg | MEDICATED_PATCH | TRANSDERMAL | 12 refills | Status: DC

## 2021-03-11 MED ORDER — HYDROCODONE BIT-HOMATROP MBR 5-1.5 MG/5ML PO SOLN: 5.0000 mL | Freq: Four times a day (QID) | ORAL | 0 refills | Status: DC | PRN

## 2021-03-11 MED ORDER — AZITHROMYCIN 250 MG PO TABS: ORAL_TABLET | ORAL | 0 refills | Status: DC

## 2021-03-11 NOTE — Progress Notes (Signed)
Virtual Visit via Video Note  I connected with Robin Estrada on 03/16/21 at  8:30 AM EDT by a video enabled telemedicine application and verified that I am speaking with the correct person using two identifiers.   I discussed the limitations of evaluation and management by telemedicine and the availability of in person appointments. The patient expressed understanding and agreed to proceed.  I was located at our [facility] office. The patient was at home. There was no one else present in the visit.  No chief complaint on file.    History of Present Illness:  Patient is complaining of persistent cough, fatigue, congestion of several weeks duration.  She had COVID-like symptoms over 1 month ago.  She did not do a COVID test. ROS Dry cough, sometimes productive.  Sinus congestion.  Insomnia due to cough.  Fatigue  Observations/Objective: The patient appears to be in no acute distress, looks a little tired  Assessment and Plan:  Problem List Items Addressed This Visit     Cough in adult    ?post-COVID x 1 month Zpac Hycodan syr prn Pt declined CXR  COVID IgG      Relevant Orders   SAR CoV2 Serology (COVID 19)AB(IGG)IA   C-reactive protein   CBC with Differential/Platelet   Comprehensive metabolic panel   TSH   Urinalysis   Essential hypertension    Change to Catapress      Relevant Medications   cloNIDine (CATAPRES-TTS-1) 0.1 mg/24hr patch   Fatigue    ?post-COVID Check TSH, COVID IgG      Relevant Orders   SAR CoV2 Serology (COVID 19)AB(IGG)IA   C-reactive protein   CBC with Differential/Platelet   Comprehensive metabolic panel   TSH   Urinalysis     Meds ordered this encounter  Medications   HYDROcodone bit-homatropine (HYCODAN) 5-1.5 MG/5ML syrup    Sig: Take 5 mLs by mouth every 6 (six) hours as needed for cough.    Dispense:  240 mL    Refill:  0   azithromycin (ZITHROMAX Z-PAK) 250 MG tablet    Sig: As directed    Dispense:  6 tablet     Refill:  0   cloNIDine (CATAPRES-TTS-1) 0.1 mg/24hr patch    Sig: Place 1 patch (0.1 mg total) onto the skin once a week.    Dispense:  4 patch    Refill:  12      Follow Up Instructions:    I discussed the assessment and treatment plan with the patient. The patient was provided an opportunity to ask questions and all were answered. The patient agreed with the plan and demonstrated an understanding of the instructions.   The patient was advised to call back or seek an in-person evaluation if the symptoms worsen or if the condition fails to improve as anticipated.  I provided face-to-face time during this encounter. We were at different locations.   Walker Kehr, MD

## 2021-03-11 NOTE — Assessment & Plan Note (Signed)
Change to Catapress

## 2021-03-11 NOTE — Assessment & Plan Note (Signed)
?  post-COVID Check TSH, COVID IgG

## 2021-03-11 NOTE — Assessment & Plan Note (Addendum)
?  post-COVID x 1 month Zpac Hycodan syr prn Pt declined CXR  COVID IgG

## 2021-04-01 ENCOUNTER — Other Ambulatory Visit: Payer: Self-pay

## 2021-04-01 ENCOUNTER — Other Ambulatory Visit (INDEPENDENT_AMBULATORY_CARE_PROVIDER_SITE_OTHER): Payer: Medicare HMO

## 2021-04-01 DIAGNOSIS — R059 Cough, unspecified: Secondary | ICD-10-CM | POA: Diagnosis not present

## 2021-04-01 DIAGNOSIS — R5382 Chronic fatigue, unspecified: Secondary | ICD-10-CM

## 2021-04-01 LAB — CBC WITH DIFFERENTIAL/PLATELET
Basophils Absolute: 0.2 10*3/uL — ABNORMAL HIGH (ref 0.0–0.1)
Basophils Relative: 2 % (ref 0.0–3.0)
Eosinophils Absolute: 0.2 10*3/uL (ref 0.0–0.7)
Eosinophils Relative: 2.6 % (ref 0.0–5.0)
HCT: 26.3 % — ABNORMAL LOW (ref 36.0–46.0)
Hemoglobin: 8.3 g/dL — ABNORMAL LOW (ref 12.0–15.0)
Lymphocytes Relative: 11.9 % — ABNORMAL LOW (ref 12.0–46.0)
Lymphs Abs: 0.9 10*3/uL (ref 0.7–4.0)
MCHC: 31.2 g/dL (ref 30.0–36.0)
MCV: 68.6 fl — ABNORMAL LOW (ref 78.0–100.0)
Monocytes Absolute: 0.6 10*3/uL (ref 0.1–1.0)
Monocytes Relative: 7.4 % (ref 3.0–12.0)
Neutro Abs: 6 10*3/uL (ref 1.4–7.7)
Neutrophils Relative %: 76.1 % (ref 43.0–77.0)
Platelets: 509 10*3/uL — ABNORMAL HIGH (ref 150.0–400.0)
RBC: 3.83 Mil/uL — ABNORMAL LOW (ref 3.87–5.11)
RDW: 19.6 % — ABNORMAL HIGH (ref 11.5–15.5)
WBC: 7.9 10*3/uL (ref 4.0–10.5)

## 2021-04-01 LAB — COMPREHENSIVE METABOLIC PANEL WITH GFR
ALT: 9 U/L (ref 0–35)
AST: 14 U/L (ref 0–37)
Albumin: 4.2 g/dL (ref 3.5–5.2)
Alkaline Phosphatase: 92 U/L (ref 39–117)
BUN: 13 mg/dL (ref 6–23)
CO2: 24 meq/L (ref 19–32)
Calcium: 9.3 mg/dL (ref 8.4–10.5)
Chloride: 97 meq/L (ref 96–112)
Creatinine, Ser: 0.96 mg/dL (ref 0.40–1.20)
GFR: 57.45 mL/min — ABNORMAL LOW (ref >60.00)
Glucose, Bld: 111 mg/dL — ABNORMAL HIGH (ref 70–99)
Potassium: 3.4 meq/L — ABNORMAL LOW (ref 3.5–5.1)
Sodium: 133 meq/L — ABNORMAL LOW (ref 135–145)
Total Bilirubin: 0.4 mg/dL (ref 0.2–1.2)
Total Protein: 7.3 g/dL (ref 6.0–8.3)

## 2021-04-01 LAB — TSH: TSH: 3.21 u[IU]/mL (ref 0.35–5.50)

## 2021-04-01 LAB — C-REACTIVE PROTEIN: CRP: 9.1 mg/dL (ref 0.5–20.0)

## 2021-04-02 LAB — URINALYSIS
Bilirubin Urine: NEGATIVE
Hgb urine dipstick: NEGATIVE
Leukocytes,Ua: NEGATIVE
Nitrite: NEGATIVE
Specific Gravity, Urine: <=1.005 — AB (ref 1.000–1.030)
Total Protein, Urine: NEGATIVE
Urine Glucose: NEGATIVE
Urobilinogen, UA: 0.2 (ref 0.0–1.0)
pH: 6 (ref 5.0–8.0)

## 2021-04-02 LAB — SARS-COV-2 ANTIBODY(IGG)SPIKE,SEMI-QUANTITATIVE: SARS COV1 AB(IGG)SPIKE,SEMI QN: <1 index (ref <1.00)

## 2021-04-10 ENCOUNTER — Encounter: Payer: Self-pay | Admitting: Internal Medicine

## 2021-04-14 ENCOUNTER — Other Ambulatory Visit: Payer: Self-pay | Admitting: Internal Medicine

## 2021-04-14 DIAGNOSIS — D649 Anemia, unspecified: Secondary | ICD-10-CM

## 2021-05-02 ENCOUNTER — Emergency Department (HOSPITAL_COMMUNITY): Payer: Medicare HMO

## 2021-05-02 ENCOUNTER — Inpatient Hospital Stay (HOSPITAL_COMMUNITY)
Admission: EM | Admit: 2021-05-02 | Discharge: 2021-05-26 | DRG: 542 | Disposition: A | Discharge status: Home / Self Care | Payer: Medicare HMO | Attending: Internal Medicine | Admitting: Internal Medicine

## 2021-05-02 ENCOUNTER — Other Ambulatory Visit: Payer: Self-pay

## 2021-05-02 DIAGNOSIS — Z171 Estrogen receptor negative status [ER-]: Secondary | ICD-10-CM

## 2021-05-02 DIAGNOSIS — J439 Emphysema, unspecified: Secondary | ICD-10-CM

## 2021-05-02 DIAGNOSIS — Z515 Encounter for palliative care: Secondary | ICD-10-CM

## 2021-05-02 DIAGNOSIS — K922 Gastrointestinal hemorrhage, unspecified: Secondary | ICD-10-CM

## 2021-05-02 DIAGNOSIS — R918 Other nonspecific abnormal finding of lung field: Secondary | ICD-10-CM

## 2021-05-02 DIAGNOSIS — Z9012 Acquired absence of left breast and nipple: Secondary | ICD-10-CM

## 2021-05-02 DIAGNOSIS — D500 Iron deficiency anemia secondary to blood loss (chronic): Secondary | ICD-10-CM

## 2021-05-02 DIAGNOSIS — J95811 Postprocedural pneumothorax: Secondary | ICD-10-CM

## 2021-05-02 DIAGNOSIS — Z853 Personal history of malignant neoplasm of breast: Secondary | ICD-10-CM

## 2021-05-02 DIAGNOSIS — Z803 Family history of malignant neoplasm of breast: Secondary | ICD-10-CM

## 2021-05-02 DIAGNOSIS — D62 Acute posthemorrhagic anemia: Secondary | ICD-10-CM | POA: Diagnosis present

## 2021-05-02 DIAGNOSIS — E878 Other disorders of electrolyte and fluid balance, not elsewhere classified: Secondary | ICD-10-CM | POA: Diagnosis present

## 2021-05-02 DIAGNOSIS — Z823 Family history of stroke: Secondary | ICD-10-CM

## 2021-05-02 DIAGNOSIS — D509 Iron deficiency anemia, unspecified: Secondary | ICD-10-CM | POA: Diagnosis present

## 2021-05-02 DIAGNOSIS — R634 Abnormal weight loss: Secondary | ICD-10-CM | POA: Diagnosis not present

## 2021-05-02 DIAGNOSIS — C50919 Malignant neoplasm of unspecified site of unspecified female breast: Secondary | ICD-10-CM | POA: Diagnosis present

## 2021-05-02 DIAGNOSIS — K5730 Diverticulosis of large intestine without perforation or abscess without bleeding: Secondary | ICD-10-CM | POA: Diagnosis present

## 2021-05-02 DIAGNOSIS — Z20822 Contact with and (suspected) exposure to covid-19: Secondary | ICD-10-CM | POA: Diagnosis present

## 2021-05-02 DIAGNOSIS — C787 Secondary malignant neoplasm of liver and intrahepatic bile duct: Secondary | ICD-10-CM | POA: Diagnosis present

## 2021-05-02 DIAGNOSIS — E538 Deficiency of other specified B group vitamins: Secondary | ICD-10-CM | POA: Diagnosis present

## 2021-05-02 DIAGNOSIS — R1010 Upper abdominal pain, unspecified: Secondary | ICD-10-CM

## 2021-05-02 DIAGNOSIS — E8809 Other disorders of plasma-protein metabolism, not elsewhere classified: Secondary | ICD-10-CM | POA: Diagnosis present

## 2021-05-02 DIAGNOSIS — E876 Hypokalemia: Secondary | ICD-10-CM | POA: Diagnosis present

## 2021-05-02 DIAGNOSIS — R531 Weakness: Secondary | ICD-10-CM | POA: Diagnosis not present

## 2021-05-02 DIAGNOSIS — Z8611 Personal history of tuberculosis: Secondary | ICD-10-CM

## 2021-05-02 DIAGNOSIS — I160 Hypertensive urgency: Secondary | ICD-10-CM | POA: Diagnosis present

## 2021-05-02 DIAGNOSIS — K5903 Drug induced constipation: Secondary | ICD-10-CM | POA: Diagnosis not present

## 2021-05-02 DIAGNOSIS — K862 Cyst of pancreas: Secondary | ICD-10-CM | POA: Diagnosis present

## 2021-05-02 DIAGNOSIS — Z923 Personal history of irradiation: Secondary | ICD-10-CM

## 2021-05-02 DIAGNOSIS — I5033 Acute on chronic diastolic (congestive) heart failure: Secondary | ICD-10-CM | POA: Clinically undetermined

## 2021-05-02 DIAGNOSIS — M4854XA Collapsed vertebra, not elsewhere classified, thoracic region, initial encounter for fracture: Secondary | ICD-10-CM | POA: Diagnosis present

## 2021-05-02 DIAGNOSIS — I110 Hypertensive heart disease with heart failure: Secondary | ICD-10-CM | POA: Diagnosis present

## 2021-05-02 DIAGNOSIS — D75839 Thrombocytosis, unspecified: Secondary | ICD-10-CM | POA: Diagnosis present

## 2021-05-02 DIAGNOSIS — Z603 Acculturation difficulty: Secondary | ICD-10-CM | POA: Diagnosis present

## 2021-05-02 DIAGNOSIS — I10 Essential (primary) hypertension: Secondary | ICD-10-CM | POA: Diagnosis not present

## 2021-05-02 DIAGNOSIS — C7951 Secondary malignant neoplasm of bone: Principal | ICD-10-CM | POA: Diagnosis present

## 2021-05-02 DIAGNOSIS — D72829 Elevated white blood cell count, unspecified: Secondary | ICD-10-CM | POA: Diagnosis present

## 2021-05-02 DIAGNOSIS — Z885 Allergy status to narcotic agent status: Secondary | ICD-10-CM

## 2021-05-02 DIAGNOSIS — I341 Nonrheumatic mitral (valve) prolapse: Secondary | ICD-10-CM | POA: Diagnosis present

## 2021-05-02 DIAGNOSIS — K254 Chronic or unspecified gastric ulcer with hemorrhage: Secondary | ICD-10-CM | POA: Diagnosis present

## 2021-05-02 DIAGNOSIS — I700 Atherosclerosis of aorta: Secondary | ICD-10-CM

## 2021-05-02 DIAGNOSIS — R519 Headache, unspecified: Secondary | ICD-10-CM | POA: Diagnosis not present

## 2021-05-02 DIAGNOSIS — K8689 Other specified diseases of pancreas: Secondary | ICD-10-CM | POA: Diagnosis present

## 2021-05-02 DIAGNOSIS — Z888 Allergy status to other drugs, medicaments and biological substances status: Secondary | ICD-10-CM

## 2021-05-02 DIAGNOSIS — Z9889 Other specified postprocedural states: Secondary | ICD-10-CM

## 2021-05-02 DIAGNOSIS — K769 Liver disease, unspecified: Secondary | ICD-10-CM | POA: Diagnosis present

## 2021-05-02 DIAGNOSIS — M4804 Spinal stenosis, thoracic region: Secondary | ICD-10-CM | POA: Diagnosis not present

## 2021-05-02 DIAGNOSIS — Z743 Need for continuous supervision: Secondary | ICD-10-CM | POA: Diagnosis not present

## 2021-05-02 DIAGNOSIS — R778 Other specified abnormalities of plasma proteins: Secondary | ICD-10-CM

## 2021-05-02 DIAGNOSIS — E785 Hyperlipidemia, unspecified: Secondary | ICD-10-CM | POA: Diagnosis present

## 2021-05-02 DIAGNOSIS — M8458XA Pathological fracture in neoplastic disease, other specified site, initial encounter for fracture: Secondary | ICD-10-CM | POA: Diagnosis present

## 2021-05-02 DIAGNOSIS — Z882 Allergy status to sulfonamides status: Secondary | ICD-10-CM

## 2021-05-02 DIAGNOSIS — E871 Hypo-osmolality and hyponatremia: Secondary | ICD-10-CM | POA: Diagnosis present

## 2021-05-02 DIAGNOSIS — D120 Benign neoplasm of cecum: Secondary | ICD-10-CM | POA: Diagnosis present

## 2021-05-02 DIAGNOSIS — R7989 Other specified abnormal findings of blood chemistry: Secondary | ICD-10-CM

## 2021-05-02 DIAGNOSIS — K449 Diaphragmatic hernia without obstruction or gangrene: Secondary | ICD-10-CM | POA: Diagnosis present

## 2021-05-02 DIAGNOSIS — T402X5A Adverse effect of other opioids, initial encounter: Secondary | ICD-10-CM | POA: Diagnosis not present

## 2021-05-02 DIAGNOSIS — D125 Benign neoplasm of sigmoid colon: Secondary | ICD-10-CM | POA: Diagnosis present

## 2021-05-02 DIAGNOSIS — K253 Acute gastric ulcer without hemorrhage or perforation: Secondary | ICD-10-CM | POA: Diagnosis present

## 2021-05-02 DIAGNOSIS — Z79899 Other long term (current) drug therapy: Secondary | ICD-10-CM

## 2021-05-02 DIAGNOSIS — K7469 Other cirrhosis of liver: Secondary | ICD-10-CM | POA: Diagnosis present

## 2021-05-02 DIAGNOSIS — Z8603 Personal history of neoplasm of uncertain behavior: Secondary | ICD-10-CM | POA: Diagnosis not present

## 2021-05-02 DIAGNOSIS — C785 Secondary malignant neoplasm of large intestine and rectum: Secondary | ICD-10-CM | POA: Diagnosis present

## 2021-05-02 DIAGNOSIS — C7801 Secondary malignant neoplasm of right lung: Secondary | ICD-10-CM | POA: Diagnosis present

## 2021-05-02 DIAGNOSIS — Z7982 Long term (current) use of aspirin: Secondary | ICD-10-CM

## 2021-05-02 DIAGNOSIS — R6510 Systemic inflammatory response syndrome (SIRS) of non-infectious origin without acute organ dysfunction: Secondary | ICD-10-CM | POA: Diagnosis present

## 2021-05-02 LAB — URINALYSIS, ROUTINE W REFLEX MICROSCOPIC
Bilirubin Urine: NEGATIVE
Glucose, UA: NEGATIVE mg/dL
Hgb urine dipstick: NEGATIVE
Ketones, ur: 5 mg/dL — AB
Leukocytes,Ua: NEGATIVE
Nitrite: NEGATIVE
Protein, ur: NEGATIVE mg/dL
Specific Gravity, Urine: 1.008 (ref 1.005–1.030)
pH: 7 (ref 5.0–8.0)

## 2021-05-02 NOTE — ED Triage Notes (Signed)
Pt brought in by EMS for generalized weakness and poor appetite.

## 2021-05-02 NOTE — ED Provider Notes (Signed)
Emergency Medicine Provider Triage Evaluation Note  Robin Estrada , a 75 y.o. female  was evaluated in triage.  Pt complains of feeling unwell x 3 mos.  Pt reports she has seen her MD and he believes that this is a COVID sequela onset in September.  Pt reports headache, heightened senses, loss of appetite, weight loss that have been ongoing since that time.  Pt reports eating very little over the last 3 months.   Review of Systems  Positive: Generalized weakness Negative: Fever, chills  Physical Exam  BP (!) 186/76    Pulse 82    Temp 97.7 F (36.5 C) (Oral)    Resp 16    SpO2 98%  Gen:   Awake, no distress   Resp:  Normal effort  MSK:   Moves extremities without difficulty  Other:  pale  Medical Decision Making  Medically screening exam initiated at 11:18 PM.  Appropriate orders placed.  Skie Vitrano was informed that the remainder of the evaluation will be completed by another provider, this initial triage assessment does not replace that evaluation, and the importance of remaining in the ED until their evaluation is complete.  Generalized weakness   Loni Muse Gwenlyn Perking 35/57/32 2025    Delora Fuel, MD 42/70/62 [phone]

## 2021-05-03 ENCOUNTER — Emergency Department (HOSPITAL_COMMUNITY): Payer: Medicare HMO

## 2021-05-03 ENCOUNTER — Encounter (HOSPITAL_COMMUNITY): Payer: Self-pay | Admitting: Radiology

## 2021-05-03 ENCOUNTER — Inpatient Hospital Stay (HOSPITAL_COMMUNITY): Payer: Medicare HMO

## 2021-05-03 DIAGNOSIS — I160 Hypertensive urgency: Secondary | ICD-10-CM | POA: Insufficient documentation

## 2021-05-03 DIAGNOSIS — E878 Other disorders of electrolyte and fluid balance, not elsewhere classified: Secondary | ICD-10-CM | POA: Diagnosis present

## 2021-05-03 DIAGNOSIS — D509 Iron deficiency anemia, unspecified: Secondary | ICD-10-CM

## 2021-05-03 DIAGNOSIS — I10 Essential (primary) hypertension: Secondary | ICD-10-CM | POA: Diagnosis not present

## 2021-05-03 DIAGNOSIS — K862 Cyst of pancreas: Secondary | ICD-10-CM | POA: Diagnosis not present

## 2021-05-03 DIAGNOSIS — D62 Acute posthemorrhagic anemia: Secondary | ICD-10-CM

## 2021-05-03 DIAGNOSIS — K449 Diaphragmatic hernia without obstruction or gangrene: Secondary | ICD-10-CM | POA: Diagnosis not present

## 2021-05-03 DIAGNOSIS — K259 Gastric ulcer, unspecified as acute or chronic, without hemorrhage or perforation: Secondary | ICD-10-CM | POA: Diagnosis not present

## 2021-05-03 DIAGNOSIS — R911 Solitary pulmonary nodule: Secondary | ICD-10-CM | POA: Diagnosis not present

## 2021-05-03 DIAGNOSIS — Z7189 Other specified counseling: Secondary | ICD-10-CM

## 2021-05-03 DIAGNOSIS — C3411 Malignant neoplasm of upper lobe, right bronchus or lung: Secondary | ICD-10-CM | POA: Diagnosis not present

## 2021-05-03 DIAGNOSIS — K922 Gastrointestinal hemorrhage, unspecified: Secondary | ICD-10-CM | POA: Diagnosis present

## 2021-05-03 DIAGNOSIS — R778 Other specified abnormalities of plasma proteins: Secondary | ICD-10-CM | POA: Diagnosis not present

## 2021-05-03 DIAGNOSIS — K769 Liver disease, unspecified: Secondary | ICD-10-CM | POA: Diagnosis not present

## 2021-05-03 DIAGNOSIS — I5032 Chronic diastolic (congestive) heart failure: Secondary | ICD-10-CM | POA: Diagnosis not present

## 2021-05-03 DIAGNOSIS — K7469 Other cirrhosis of liver: Secondary | ICD-10-CM | POA: Diagnosis not present

## 2021-05-03 DIAGNOSIS — Z853 Personal history of malignant neoplasm of breast: Secondary | ICD-10-CM

## 2021-05-03 DIAGNOSIS — C785 Secondary malignant neoplasm of large intestine and rectum: Secondary | ICD-10-CM | POA: Diagnosis not present

## 2021-05-03 DIAGNOSIS — J439 Emphysema, unspecified: Secondary | ICD-10-CM | POA: Diagnosis not present

## 2021-05-03 DIAGNOSIS — K254 Chronic or unspecified gastric ulcer with hemorrhage: Secondary | ICD-10-CM | POA: Diagnosis not present

## 2021-05-03 DIAGNOSIS — I110 Hypertensive heart disease with heart failure: Secondary | ICD-10-CM | POA: Diagnosis present

## 2021-05-03 DIAGNOSIS — D500 Iron deficiency anemia secondary to blood loss (chronic): Secondary | ICD-10-CM | POA: Diagnosis not present

## 2021-05-03 DIAGNOSIS — Z171 Estrogen receptor negative status [ER-]: Secondary | ICD-10-CM | POA: Diagnosis not present

## 2021-05-03 DIAGNOSIS — C50812 Malignant neoplasm of overlapping sites of left female breast: Secondary | ICD-10-CM | POA: Diagnosis not present

## 2021-05-03 DIAGNOSIS — R531 Weakness: Secondary | ICD-10-CM | POA: Diagnosis not present

## 2021-05-03 DIAGNOSIS — K2970 Gastritis, unspecified, without bleeding: Secondary | ICD-10-CM | POA: Diagnosis not present

## 2021-05-03 DIAGNOSIS — R918 Other nonspecific abnormal finding of lung field: Secondary | ICD-10-CM | POA: Diagnosis not present

## 2021-05-03 DIAGNOSIS — E876 Hypokalemia: Secondary | ICD-10-CM | POA: Diagnosis not present

## 2021-05-03 DIAGNOSIS — C7801 Secondary malignant neoplasm of right lung: Secondary | ICD-10-CM | POA: Diagnosis not present

## 2021-05-03 DIAGNOSIS — C3490 Malignant neoplasm of unspecified part of unspecified bronchus or lung: Secondary | ICD-10-CM | POA: Diagnosis not present

## 2021-05-03 DIAGNOSIS — E871 Hypo-osmolality and hyponatremia: Secondary | ICD-10-CM | POA: Diagnosis not present

## 2021-05-03 DIAGNOSIS — C801 Malignant (primary) neoplasm, unspecified: Secondary | ICD-10-CM | POA: Diagnosis not present

## 2021-05-03 DIAGNOSIS — J90 Pleural effusion, not elsewhere classified: Secondary | ICD-10-CM | POA: Diagnosis not present

## 2021-05-03 DIAGNOSIS — Z8603 Personal history of neoplasm of uncertain behavior: Secondary | ICD-10-CM | POA: Diagnosis not present

## 2021-05-03 DIAGNOSIS — D125 Benign neoplasm of sigmoid colon: Secondary | ICD-10-CM | POA: Diagnosis not present

## 2021-05-03 DIAGNOSIS — R519 Headache, unspecified: Secondary | ICD-10-CM | POA: Diagnosis not present

## 2021-05-03 DIAGNOSIS — Z20822 Contact with and (suspected) exposure to covid-19: Secondary | ICD-10-CM | POA: Diagnosis not present

## 2021-05-03 DIAGNOSIS — R112 Nausea with vomiting, unspecified: Secondary | ICD-10-CM | POA: Diagnosis not present

## 2021-05-03 DIAGNOSIS — M4854XA Collapsed vertebra, not elsewhere classified, thoracic region, initial encounter for fracture: Secondary | ICD-10-CM

## 2021-05-03 DIAGNOSIS — C180 Malignant neoplasm of cecum: Secondary | ICD-10-CM | POA: Diagnosis not present

## 2021-05-03 DIAGNOSIS — R6510 Systemic inflammatory response syndrome (SIRS) of non-infectious origin without acute organ dysfunction: Secondary | ICD-10-CM | POA: Diagnosis not present

## 2021-05-03 DIAGNOSIS — K635 Polyp of colon: Secondary | ICD-10-CM | POA: Diagnosis not present

## 2021-05-03 DIAGNOSIS — K253 Acute gastric ulcer without hemorrhage or perforation: Secondary | ICD-10-CM | POA: Diagnosis not present

## 2021-05-03 DIAGNOSIS — R059 Cough, unspecified: Secondary | ICD-10-CM | POA: Diagnosis not present

## 2021-05-03 DIAGNOSIS — R590 Localized enlarged lymph nodes: Secondary | ICD-10-CM | POA: Diagnosis not present

## 2021-05-03 DIAGNOSIS — E785 Hyperlipidemia, unspecified: Secondary | ICD-10-CM | POA: Diagnosis present

## 2021-05-03 DIAGNOSIS — K869 Disease of pancreas, unspecified: Secondary | ICD-10-CM | POA: Diagnosis not present

## 2021-05-03 DIAGNOSIS — C787 Secondary malignant neoplasm of liver and intrahepatic bile duct: Secondary | ICD-10-CM | POA: Diagnosis not present

## 2021-05-03 DIAGNOSIS — R1010 Upper abdominal pain, unspecified: Secondary | ICD-10-CM | POA: Diagnosis not present

## 2021-05-03 DIAGNOSIS — Z515 Encounter for palliative care: Secondary | ICD-10-CM

## 2021-05-03 DIAGNOSIS — C7951 Secondary malignant neoplasm of bone: Secondary | ICD-10-CM | POA: Diagnosis not present

## 2021-05-03 DIAGNOSIS — D120 Benign neoplasm of cecum: Secondary | ICD-10-CM | POA: Diagnosis not present

## 2021-05-03 DIAGNOSIS — M4804 Spinal stenosis, thoracic region: Secondary | ICD-10-CM | POA: Diagnosis not present

## 2021-05-03 DIAGNOSIS — C187 Malignant neoplasm of sigmoid colon: Secondary | ICD-10-CM | POA: Diagnosis not present

## 2021-05-03 DIAGNOSIS — I5033 Acute on chronic diastolic (congestive) heart failure: Secondary | ICD-10-CM | POA: Diagnosis not present

## 2021-05-03 DIAGNOSIS — D72829 Elevated white blood cell count, unspecified: Secondary | ICD-10-CM | POA: Diagnosis not present

## 2021-05-03 DIAGNOSIS — Z603 Acculturation difficulty: Secondary | ICD-10-CM | POA: Diagnosis present

## 2021-05-03 DIAGNOSIS — M899 Disorder of bone, unspecified: Secondary | ICD-10-CM | POA: Diagnosis not present

## 2021-05-03 DIAGNOSIS — G9520 Unspecified cord compression: Secondary | ICD-10-CM | POA: Diagnosis not present

## 2021-05-03 DIAGNOSIS — D638 Anemia in other chronic diseases classified elsewhere: Secondary | ICD-10-CM | POA: Diagnosis not present

## 2021-05-03 DIAGNOSIS — R6881 Early satiety: Secondary | ICD-10-CM | POA: Diagnosis not present

## 2021-05-03 DIAGNOSIS — J984 Other disorders of lung: Secondary | ICD-10-CM | POA: Diagnosis not present

## 2021-05-03 DIAGNOSIS — I700 Atherosclerosis of aorta: Secondary | ICD-10-CM | POA: Diagnosis not present

## 2021-05-03 DIAGNOSIS — K5731 Diverticulosis of large intestine without perforation or abscess with bleeding: Secondary | ICD-10-CM | POA: Diagnosis not present

## 2021-05-03 DIAGNOSIS — M898X9 Other specified disorders of bone, unspecified site: Secondary | ICD-10-CM | POA: Diagnosis not present

## 2021-05-03 DIAGNOSIS — M8458XA Pathological fracture in neoplastic disease, other specified site, initial encounter for fracture: Secondary | ICD-10-CM | POA: Diagnosis present

## 2021-05-03 DIAGNOSIS — I341 Nonrheumatic mitral (valve) prolapse: Secondary | ICD-10-CM | POA: Diagnosis present

## 2021-05-03 DIAGNOSIS — E8809 Other disorders of plasma-protein metabolism, not elsewhere classified: Secondary | ICD-10-CM

## 2021-05-03 DIAGNOSIS — D75839 Thrombocytosis, unspecified: Secondary | ICD-10-CM

## 2021-05-03 DIAGNOSIS — C50919 Malignant neoplasm of unspecified site of unspecified female breast: Secondary | ICD-10-CM | POA: Diagnosis not present

## 2021-05-03 DIAGNOSIS — R634 Abnormal weight loss: Secondary | ICD-10-CM | POA: Diagnosis not present

## 2021-05-03 DIAGNOSIS — I5089 Other heart failure: Secondary | ICD-10-CM | POA: Diagnosis not present

## 2021-05-03 DIAGNOSIS — M8448XA Pathological fracture, other site, initial encounter for fracture: Secondary | ICD-10-CM | POA: Diagnosis not present

## 2021-05-03 HISTORY — DX: Gastrointestinal hemorrhage, unspecified: K92.2

## 2021-05-03 LAB — CBC WITH DIFFERENTIAL/PLATELET
Abs Immature Granulocytes: 0.09 10*3/uL — ABNORMAL HIGH (ref 0.00–0.07)
Basophils Absolute: 0.1 10*3/uL (ref 0.0–0.1)
Basophils Relative: 1 %
Eosinophils Absolute: 0.1 10*3/uL (ref 0.0–0.5)
Eosinophils Relative: 1 %
HCT: 23.2 % — ABNORMAL LOW (ref 36.0–46.0)
Hemoglobin: 6.9 g/dL — CL (ref 12.0–15.0)
Immature Granulocytes: 1 %
Lymphocytes Relative: 9 %
Lymphs Abs: 1.3 10*3/uL (ref 0.7–4.0)
MCH: 20.9 pg — ABNORMAL LOW (ref 26.0–34.0)
MCHC: 28.9 g/dL — ABNORMAL LOW (ref 30.0–36.0)
MCV: 72.3 fL — ABNORMAL LOW (ref 80.0–100.0)
Monocytes Absolute: 0.9 10*3/uL (ref 0.1–1.0)
Monocytes Relative: 6 %
Neutro Abs: 11.6 10*3/uL — ABNORMAL HIGH (ref 1.7–7.7)
Neutrophils Relative %: 82 %
Platelets: 679 10*3/uL — ABNORMAL HIGH (ref 150–400)
RBC: 3.21 MIL/uL — ABNORMAL LOW (ref 3.87–5.11)
RDW: 19.9 % — ABNORMAL HIGH (ref 11.5–15.5)
WBC: 14 10*3/uL — ABNORMAL HIGH (ref 4.0–10.5)
nRBC: 0 % (ref 0.0–0.2)

## 2021-05-03 LAB — COMPREHENSIVE METABOLIC PANEL
ALT: 16 U/L (ref 0–44)
AST: 18 U/L (ref 15–41)
Albumin: 3.1 g/dL — ABNORMAL LOW (ref 3.5–5.0)
Alkaline Phosphatase: 103 U/L (ref 38–126)
Anion gap: 12 (ref 5–15)
BUN: 7 mg/dL — ABNORMAL LOW (ref 8–23)
CO2: 22 mmol/L (ref 22–32)
Calcium: 8.9 mg/dL (ref 8.9–10.3)
Chloride: 94 mmol/L — ABNORMAL LOW (ref 98–111)
Creatinine, Ser: 0.62 mg/dL (ref 0.44–1.00)
GFR, Estimated: >60 mL/min (ref >60)
Glucose, Bld: 151 mg/dL — ABNORMAL HIGH (ref 70–99)
Potassium: 3.4 mmol/L — ABNORMAL LOW (ref 3.5–5.1)
Sodium: 128 mmol/L — ABNORMAL LOW (ref 135–145)
Total Bilirubin: 0.5 mg/dL (ref 0.3–1.2)
Total Protein: 6.7 g/dL (ref 6.5–8.1)

## 2021-05-03 LAB — TROPONIN I (HIGH SENSITIVITY)
Troponin I (High Sensitivity): 32 ng/L — ABNORMAL HIGH (ref <18)
Troponin I (High Sensitivity): 39 ng/L — ABNORMAL HIGH (ref <18)

## 2021-05-03 LAB — MAGNESIUM: Magnesium: 1.9 mg/dL (ref 1.7–2.4)

## 2021-05-03 LAB — RESP PANEL BY RT-PCR (FLU A&B, COVID) ARPGX2
Influenza A by PCR: NEGATIVE
Influenza B by PCR: NEGATIVE
SARS Coronavirus 2 by RT PCR: NEGATIVE

## 2021-05-03 LAB — VITAMIN B12: Vitamin B-12: 282 pg/mL (ref 180–914)

## 2021-05-03 LAB — ABO/RH: ABO/RH(D): B POS

## 2021-05-03 LAB — PREALBUMIN: Prealbumin: 8.8 mg/dL — ABNORMAL LOW (ref 18–38)

## 2021-05-03 LAB — FERRITIN: Ferritin: 22 ng/mL (ref 11–307)

## 2021-05-03 LAB — RETICULOCYTES
Immature Retic Fract: 38.7 % — ABNORMAL HIGH (ref 2.3–15.9)
RBC.: 2.67 MIL/uL — ABNORMAL LOW (ref 3.87–5.11)
Retic Count, Absolute: 74.8 10*3/uL (ref 19.0–186.0)
Retic Ct Pct: 2.8 % (ref 0.4–3.1)

## 2021-05-03 LAB — OSMOLALITY: Osmolality: 273 mOsm/kg — ABNORMAL LOW (ref 275–295)

## 2021-05-03 LAB — IRON AND TIBC
Iron: 37 ug/dL (ref 28–170)
Saturation Ratios: 10 % — ABNORMAL LOW (ref 10.4–31.8)
TIBC: 363 ug/dL (ref 250–450)
UIBC: 326 ug/dL

## 2021-05-03 LAB — LIPASE, BLOOD: Lipase: 30 U/L (ref 11–51)

## 2021-05-03 LAB — POC OCCULT BLOOD, ED: Fecal Occult Bld: POSITIVE — AB

## 2021-05-03 LAB — FOLATE: Folate: 10.6 ng/mL (ref >5.9)

## 2021-05-03 MED ORDER — PANTOPRAZOLE INFUSION (NEW) - SIMPLE MED
8.0000 mg/h | INTRAVENOUS | Status: DC
  Filled 2021-05-03: qty 100

## 2021-05-03 MED ORDER — IOHEXOL 300 MG/ML  SOLN
100.0000 mL | Freq: Once | INTRAMUSCULAR | Status: AC | PRN
  Administered 2021-05-03: 05:00:00 100 mL via INTRAVENOUS

## 2021-05-03 MED ORDER — SODIUM CHLORIDE 0.9 % IV SOLN: INTRAVENOUS | Status: DC

## 2021-05-03 MED ORDER — ONDANSETRON HCL 4 MG/2ML IJ SOLN
4.0000 mg | Freq: Four times a day (QID) | INTRAMUSCULAR | Status: DC | PRN
  Administered 2021-05-09: 13:00:00 4 mg via INTRAVENOUS

## 2021-05-03 MED ORDER — PANTOPRAZOLE SODIUM 40 MG IV SOLR: 40.0000 mg | Freq: Two times a day (BID) | INTRAVENOUS | Status: DC

## 2021-05-03 MED ORDER — ACETAMINOPHEN 325 MG PO TABS
650.0000 mg | ORAL_TABLET | Freq: Four times a day (QID) | ORAL | Status: DC | PRN
  Filled 2021-05-03: qty 2

## 2021-05-03 MED ORDER — POTASSIUM CHLORIDE CRYS ER 20 MEQ PO TBCR
40.0000 meq | EXTENDED_RELEASE_TABLET | ORAL | Status: AC
  Administered 2021-05-03: 12:00:00 40 meq via ORAL
  Filled 2021-05-03: qty 2

## 2021-05-03 MED ORDER — MORPHINE SULFATE (PF) 4 MG/ML IV SOLN
4.0000 mg | Freq: Once | INTRAVENOUS | Status: AC
  Administered 2021-05-03: 05:00:00 4 mg via INTRAVENOUS
  Filled 2021-05-03: qty 1

## 2021-05-03 MED ORDER — MORPHINE SULFATE (PF) 4 MG/ML IV SOLN
4.0000 mg | Freq: Once | INTRAVENOUS | Status: AC
  Administered 2021-05-03: 06:00:00 4 mg via INTRAVENOUS
  Filled 2021-05-03: qty 1

## 2021-05-03 MED ORDER — ALBUTEROL SULFATE (2.5 MG/3ML) 0.083% IN NEBU
2.5000 mg | INHALATION_SOLUTION | Freq: Four times a day (QID) | RESPIRATORY_TRACT | Status: DC | PRN
  Administered 2021-05-08: 08:00:00 2.5 mg via RESPIRATORY_TRACT
  Filled 2021-05-03: qty 3

## 2021-05-03 MED ORDER — ACETAMINOPHEN 650 MG RE SUPP: 650.0000 mg | Freq: Four times a day (QID) | RECTAL | Status: DC | PRN

## 2021-05-03 MED ORDER — HYDROCODONE-ACETAMINOPHEN 5-325 MG PO TABS
1.0000 | ORAL_TABLET | Freq: Four times a day (QID) | ORAL | Status: DC | PRN
  Administered 2021-05-03 – 2021-05-12 (×9): 1 via ORAL
  Filled 2021-05-03 (×11): qty 1

## 2021-05-03 MED ORDER — ATENOLOL 25 MG PO TABS
100.0000 mg | ORAL_TABLET | Freq: Two times a day (BID) | ORAL | Status: DC
  Administered 2021-05-03 – 2021-05-26 (×44): 100 mg via ORAL
  Filled 2021-05-03 (×48): qty 4

## 2021-05-03 MED ORDER — PANTOPRAZOLE SODIUM 40 MG IV SOLR
40.0000 mg | Freq: Two times a day (BID) | INTRAVENOUS | Status: DC
  Administered 2021-05-03 – 2021-05-04 (×3): 40 mg via INTRAVENOUS
  Filled 2021-05-03 (×3): qty 40

## 2021-05-03 MED ORDER — ONDANSETRON HCL 4 MG PO TABS: 4.0000 mg | ORAL_TABLET | Freq: Four times a day (QID) | ORAL | Status: DC | PRN

## 2021-05-03 MED ORDER — IOHEXOL 300 MG/ML  SOLN
75.0000 mL | Freq: Once | INTRAMUSCULAR | Status: AC | PRN
  Administered 2021-05-03: 10:00:00 75 mL via INTRAVENOUS

## 2021-05-03 MED ORDER — SODIUM CHLORIDE 0.9% FLUSH
3.0000 mL | Freq: Two times a day (BID) | INTRAVENOUS | Status: DC
  Administered 2021-05-03 – 2021-05-24 (×36): 3 mL via INTRAVENOUS

## 2021-05-03 MED ORDER — ONDANSETRON HCL 4 MG/2ML IJ SOLN
4.0000 mg | Freq: Once | INTRAMUSCULAR | Status: AC
  Administered 2021-05-03: 05:00:00 4 mg via INTRAVENOUS
  Filled 2021-05-03: qty 2

## 2021-05-03 MED ORDER — PANTOPRAZOLE 80MG IVPB - SIMPLE MED
80.0000 mg | Freq: Once | INTRAVENOUS | Status: DC
  Filled 2021-05-03: qty 100

## 2021-05-03 MED ORDER — HYDRALAZINE HCL 20 MG/ML IJ SOLN
10.0000 mg | INTRAMUSCULAR | Status: DC | PRN
  Filled 2021-05-03: qty 1

## 2021-05-03 MED ORDER — MORPHINE SULFATE (PF) 2 MG/ML IV SOLN
2.0000 mg | INTRAVENOUS | Status: DC | PRN
  Administered 2021-05-05 – 2021-05-06 (×2): 2 mg via INTRAVENOUS
  Filled 2021-05-03 (×3): qty 1

## 2021-05-03 NOTE — ED Notes (Addendum)
Pt here with with son who speaks [place] and translates. Pt complains of weakness since October and increased shob with exertion. Pt does not want any blood products

## 2021-05-03 NOTE — ED Notes (Signed)
Lab called, hemoglobin 6.9.

## 2021-05-03 NOTE — H&P (Addendum)
History and Physical    Robin Estrada POE:[id number] DOB: 1946-04-28 DOA: 05/02/2021  Referring MD/NP/PA: Shela Leff, MD PCP: [name], MD  Patient coming from: Home via EMS  Chief Complaint: Weakness  I have personally briefly reviewed patient's old medical records in [place]   HPI: Robin Estrada is a 75 y.o. 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  History is obtained with use of interpreter services, but limited due to language barrier.  Patient notes a progressive decline over the last 3 months where she reports that she has had loss of appetite along with weight loss of 15 kg.  Of the last 2 to 3 days she reports a more acute decline where she is felt unwell and was unable to go upstairs to the second floor due to feeling so weak.  She lives at home with her son and his family.  Notes that symptoms usually only occur with activity and she is okay at rest.  Associated symptoms include some shortness of breath with exertion, lower back pain that has been worse here recently, constipation, and abdominal pain with eating.  To her knowledge she has not noticed any blood in her stools and previously had a cough that she reports had resolved.  She uses ibuprofen but only intermittently and not on a regular basis.  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  He also reports that since August or so she has had this lingering nonproductive cough, but never had a history of smoking or alcohol abuse.   ED Course: Upon admission into the emergency department patient was seen to be afebrile, respirations 16-29, blood pressures 164/89-193/72, and all other  vital signs maintained.  Labs significant for WBC 14, hemoglobin 6.9, platelets 679, sodium 128, potassium 3.4, chloride 94, albumin 3.1, and high-sensitivity troponin 32->39.  Stool guaiac was noted to be positive.  Chest x-ray noted right upper lobe mass concerning for primary pulmonary neoplasm.    Urinalysis did not note any significant signs of infection.  Influenza and COVID-19 screening were negative.  CT scan of the head was unremarkable. CT scan of the abdomen pelvis significant for diffuse metastatic osseous disease with T11 pathologic compression fracture, heterogeneous enhancement of the liver is new since imaging from 2019 small layering pleural effusions, small 1.8 cm cystic mass to tail of the pancreas, and aortic atherosclerosis.  Patient declined blood transfusions.  Patient was given Protonix drip.  Accepted to a telemetry bed.  Review of Systems  Constitutional:  Positive for malaise/fatigue and weight loss. Negative for fever.  HENT:  Negative for ear discharge.   Eyes:  Negative for photophobia.  Respiratory:  Positive for cough and shortness of breath. Negative for hemoptysis.   Cardiovascular:  Negative for chest pain and leg swelling.  Gastrointestinal:  Positive for abdominal pain and constipation. Negative for blood in stool and diarrhea.  Genitourinary:  Negative for dysuria.  Musculoskeletal:  Positive for back pain.  Skin:  Negative for rash.  Neurological:  Positive for weakness and headaches.  Psychiatric/Behavioral:  Negative for substance abuse.    Past Medical History:  Diagnosis Date   Breast mass, left    Large   Cancer ([facility])  FIBROMYOMA  AGE 43-   BENIGN  RESOLVED   Headache    Heart murmur    Hypertension    Mitral valve prolapse    Tuberculosis    AS CHILD  W/ TX    Past Surgical History:  Procedure Laterality Date   MASS EXCISION Left 08/14/2017   Procedure: PARTIAL EXCISION  LEFT BREAST  MASS ERAS PATHWAY;  Surgeon: Fanny Skates, MD;   Location: [facility];  Service: General;  Laterality: Left;   MASTECTOMY W/ SENTINEL NODE BIOPSY Left 09/14/2017   Procedure: LEFT TOTAL MASTECTOMY WITH SENTINEL LYMPH NODE BIOPSY;  Surgeon: Fanny Skates, MD;  Location: [facility];  Service: General;  Laterality: Left;   NO PAST SURGERIES       reports that she has never smoked. She has never used smokeless tobacco. She reports current alcohol use. She reports that she does not use drugs.  Allergies  Allergen Reactions   Hydrocodone-Acetaminophen Nausea Only   Statins Nausea And Vomiting and Other (See Comments)    MUSCLE PAIN   Amlodipine     fatigue   Clonidine Derivatives     headache   Losartan     abd pain   Sulfa Antibiotics Nausea Only   Sulfamethoxazole-Trimethoprim Nausea Only    " Severe Nausea "    Family History  Problem Relation Age of Onset   Stroke Mother    Stroke Maternal Grandmother    Breast cancer Sister     Prior to Admission medications   Medication Sig Start Date End Date Taking? Authorizing Provider  acetaminophen (TYLENOL) 500 MG tablet Take 1,000 mg by mouth every 6 (six) hours as needed for headache.   Yes [provider]  aspirin 81 MG tablet Take 81 mg by mouth daily. 04/29/18  Yes Magrinat, Virgie Dad, MD  atenolol (TENORMIN) 100 MG tablet Take 1 tablet (100 mg total) by mouth 2 (two) times daily. 08/02/20  Yes Plotnikov, Evie Lacks, MD  cholecalciferol (VITAMIN D) 1000 units tablet Take 1 tablet (1,000 Units total) by mouth daily. 08/01/16  Yes Plotnikov, Evie Lacks, MD  triamterene-hydrochlorothiazide (MAXZIDE-25) 37.5-25 MG tablet Take 1 tablet by mouth daily. 08/30/20  Yes Plotnikov, Evie Lacks, MD  vitamin C (ASCORBIC ACID) 500 MG tablet Take 1 tablet (500 mg total) by mouth every other day. 04/28/18  Yes Magrinat, Virgie Dad, MD  azithromycin (ZITHROMAX Z-PAK) 250 MG tablet As directed Patient not taking: Reported on 05/03/2021 03/11/21   Plotnikov, Evie Lacks, MD  cloNIDine (CATAPRES) 0.1 MG tablet  Take 1 tablet (0.1 mg total) by mouth 3 (three) times daily as needed (if systolic PT>465). Patient not taking: Reported on 05/03/2021 11/29/20   Plotnikov, Evie Lacks, MD  cloNIDine (CATAPRES-TTS-1) 0.1 mg/24hr patch Place 1 patch (0.1 mg total) onto the skin once a week. Patient not taking: Reported on 05/03/2021 03/11/21   Plotnikov, Evie Lacks, MD  HYDROcodone bit-homatropine (HYCODAN) 5-1.5 MG/5ML syrup Take 5 mLs by mouth every 6 (six) hours as needed for cough. Patient not taking: Reported on 05/03/2021 03/11/21   Plotnikov, Evie Lacks, MD  MegaRed Omega-3 Krill Oil 500 MG CAPS Take 1 capsule by mouth every morning. Patient not taking: Reported on 05/03/2021 08/02/20   Plotnikov, Evie Lacks, MD    Physical Exam:  Constitutional: Elderly female who appears to be in no acute distress at this time able to follow commands Vitals:   05/03/21 0448 05/03/21 0500 05/03/21 0530 05/03/21 0545  BP: (!) 177/72 (!) 180/68 (!) 168/65 Marland Kitchen)  175/57  Pulse: 80 77 76 67  Resp: (!) 26 (!) 24 (!) 21 (!) 25  Temp:      TempSrc:      SpO2: 97% 96% 99% 96%  Weight:      Height:       Eyes: PERRL, lids and conjunctivae normal ENMT: Mucous membranes are moist. Posterior pharynx clear of any exudate or lesions.  Neck: normal, supple, no masses, no thyromegaly Respiratory: Normal respiratory effort without significant wheezes or rhonchi appreciated.  O2 saturation maintained on room air. Cardiovascular: Regular rate and rhythm, no murmurs / rubs / gallops. No extremity edema. 2+ pedal pulses.  Left mastectomy Abdomen: Mild epigastric tenderness appreciated. Musculoskeletal: no clubbing / cyanosis. No joint deformity upper and lower extremities. Good ROM, no contractures. Normal muscle tone.  Skin: Pallor present.  No other lesions appreciated. Neurologic: CN 2-12 grossly intact.  Able to move all extremities. Psychiatric: Normal judgment and insight. Alert and oriented x 3. Normal mood.     Labs on  Admission: I have personally reviewed following labs and imaging studies  CBC: Recent Labs  Lab 05/02/21 2343  WBC 14.0*  NEUTROABS 11.6*  HGB 6.9*  HCT 23.2*  MCV 72.3*  PLT 945*   Basic Metabolic Panel: Recent Labs  Lab 05/02/21 2343  NA 128*  K 3.4*  CL 94*  CO2 22  GLUCOSE 151*  BUN 7*  CREATININE 0.62  CALCIUM 8.9  MG 1.9   GFR: Estimated Creatinine Clearance: 51.9 mL/min (by C-G formula based on SCr of 0.62 mg/dL). Liver Function Tests: Recent Labs  Lab 05/02/21 2343  AST 18  ALT 16  ALKPHOS 103  BILITOT 0.5  PROT 6.7  ALBUMIN 3.1*   Recent Labs  Lab 05/02/21 2343  LIPASE 30   No results for input(s): AMMONIA in the last 168 hours. Coagulation Profile: No results for input(s): INR, PROTIME in the last 168 hours. Cardiac Enzymes: No results for input(s): CKTOTAL, CKMB, CKMBINDEX, TROPONINI in the last 168 hours. BNP (last 3 results) No results for input(s): PROBNP in the last 8760 hours. HbA1C: No results for input(s): HGBA1C in the last 72 hours. CBG: No results for input(s): GLUCAP in the last 168 hours. Lipid Profile: No results for input(s): CHOL, HDL, LDLCALC, TRIG, CHOLHDL, LDLDIRECT in the last 72 hours. Thyroid Function Tests: No results for input(s): TSH, T4TOTAL, FREET4, T3FREE, THYROIDAB in the last 72 hours. Anemia Panel: Recent Labs    05/03/21 0401 05/03/21 0453  VITAMINB12  --  282  FOLATE 10.6  --   FERRITIN  --  22  TIBC  --  363  IRON  --  37  RETICCTPCT 2.8  --    Urine analysis:    Component Value Date/Time   COLORURINE STRAW (A) 05/02/2021 2318   APPEARANCEUR CLEAR 05/02/2021 2318   LABSPEC 1.008 05/02/2021 2318   PHURINE 7.0 05/02/2021 2318   GLUCOSEU NEGATIVE 05/02/2021 2318   GLUCOSEU NEGATIVE 04/01/2021 0830   HGBUR NEGATIVE 05/02/2021 2318   BILIRUBINUR NEGATIVE 05/02/2021 2318   KETONESUR 5 (A) 05/02/2021 2318   PROTEINUR NEGATIVE 05/02/2021 2318   UROBILINOGEN 0.2 04/01/2021 0830   NITRITE NEGATIVE  05/02/2021 2318   LEUKOCYTESUR NEGATIVE 05/02/2021 2318   Sepsis Labs: Recent Results (from the past 240 hour(s))  Resp Panel by RT-PCR (Flu A&B, Covid) Nasopharyngeal Swab     Status: None   Collection Time: 05/02/21 11:18 PM   Specimen: Nasopharyngeal Swab; Nasopharyngeal(NP) swabs in vial transport medium  Result  Value Ref Range Status   SARS Coronavirus 2 by RT PCR NEGATIVE NEGATIVE Final    Comment: (NOTE) SARS-CoV-2 target nucleic acids are NOT DETECTED.  The SARS-CoV-2 RNA is generally detectable in upper respiratory specimens during the acute phase of infection. The lowest concentration of SARS-CoV-2 viral copies this assay can detect is 138 copies/mL. A negative result does not preclude SARS-Cov-2 infection and should not be used as the sole basis for treatment or other patient management decisions. A negative result may occur with  improper specimen collection/handling, submission of specimen other than nasopharyngeal swab, presence of viral mutation(s) within the areas targeted by this assay, and inadequate number of viral copies(<138 copies/mL). A negative result must be combined with clinical observations, patient history, and epidemiological information. The expected result is Negative.  Fact Sheet for Patients:  EntrepreneurPulse.com.au  Fact Sheet for Healthcare Providers:  IncredibleEmployment.be  This test is no t yet approved or cleared by the [place] FDA and  has been authorized for detection and/or diagnosis of SARS-CoV-2 by FDA under an Emergency Use Authorization (EUA). This EUA will remain  in effect (meaning this test can be used) for the duration of the COVID-19 declaration under Section 564(b)(1) of the Act, 21 U.S.C.section 360bbb-3(b)(1), unless the authorization is terminated  or revoked sooner.       Influenza A by PCR NEGATIVE NEGATIVE Final   Influenza B by PCR NEGATIVE NEGATIVE Final    Comment:  (NOTE) The Xpert Xpress SARS-CoV-2/FLU/RSV plus assay is intended as an aid in the diagnosis of influenza from Nasopharyngeal swab specimens and should not be used as a sole basis for treatment. Nasal washings and aspirates are unacceptable for Xpert Xpress SARS-CoV-2/FLU/RSV testing.  Fact Sheet for Patients: EntrepreneurPulse.com.au  Fact Sheet for Healthcare Providers: IncredibleEmployment.be  This test is not yet approved or cleared by the [place] FDA and has been authorized for detection and/or diagnosis of SARS-CoV-2 by FDA under an Emergency Use Authorization (EUA). This EUA will remain in effect (meaning this test can be used) for the duration of the COVID-19 declaration under Section 564(b)(1) of the Act, 21 U.S.C. section 360bbb-3(b)(1), unless the authorization is terminated or revoked.  Performed at [facility], [place] [address], [place], [place] [address]      Radiological Exams on Admission: DG Chest 2 View  Result Date: 05/03/2021 CLINICAL DATA:  Weakness history of breast mass, initial encounter EXAM: CHEST - 2 VIEW COMPARISON:  11/09/2017 FINDINGS: Check shadow is mildly enlarged. The lungs are well aerated bilaterally. There is a 3.9 x 3.0 cm rounded soft tissue mass lesion projecting in the posterior aspect of the right upper lobe along the major fissure. This was not seen on the prior exam and is felt to represent a primary pulmonary neoplasm till proven otherwise. No bony abnormality is noted. IMPRESSION: Right upper lobe mass. CT of the chest with contrast is recommended for further evaluation. Electronically Signed   By: Inez Catalina M.D.   On: 05/03/2021 00:09   CT Head Wo Contrast  Result Date: 05/03/2021 CLINICAL DATA:  75 year old female with headache weakness, decreased P.O. Right side flank pain for 3 months. EXAM: CT HEAD WITHOUT CONTRAST TECHNIQUE: Contiguous axial images were obtained from the base of the  skull through the vertex without intravenous contrast. COMPARISON:  Head CT 06/02/2015. FINDINGS: Brain: Cerebral volume is within normal limits for age. No midline shift, ventriculomegaly, mass effect, evidence of mass lesion, intracranial hemorrhage or evidence of cortically based acute  infarction. Gray-white matter differentiation is within normal limits throughout the brain. Vascular: Mild Calcified atherosclerosis at the skull base. No suspicious intracranial vascular hyperdensity. Skull: Stable, negative. Sinuses/Orbits: Visualized paranasal sinuses and mastoids are clear. Tympanic cavities are clear. Other: Visualized orbits and scalp soft tissues are within normal limits. IMPRESSION: Normal for age noncontrast Head CT. Electronically Signed   By: Genevie Ann M.D.   On: 05/03/2021 04:49   CT ABDOMEN PELVIS W CONTRAST  Result Date: 05/03/2021 CLINICAL DATA:  75 year old female with headache weakness, decreased P.O. Right side flank pain for 3 months. 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 EXAM: CT ABDOMEN AND PELVIS WITH CONTRAST TECHNIQUE: Multidetector CT imaging of the abdomen and pelvis was performed using the standard protocol following bolus administration of intravenous contrast. CONTRAST:  141mL OMNIPAQUE IOHEXOL 300 MG/ML  SOLN COMPARISON:  Chest CT 11/10/2017. FINDINGS: Lower chest: Small layering pleural effusions are new since 2019, larger on the right with simple fluid density. No pericardial effusion. Cardiac size now at the upper limits of normal. Mild lung base atelectasis. Hepatobiliary: Coarse, heterogeneous liver enhancement is new since 2019. This has an infiltrative appearance, without a discrete liver mass or lesion. Gallbladder remains within normal limits. Pancreas: Small circumscribed simple fluid density cystic mass at the tail of the pancreas is 18 mm on series 5, image 27. Elsewhere the pancreatic parenchyma appears somewhat  atrophied. No regional inflammation. Spleen: Negative. Adrenals/Urinary Tract: Normal adrenal glands. Symmetric renal enhancement and contrast excretion. Distended but otherwise unremarkable bladder. Stomach/Bowel: Redundant but decompressed large bowel in the abdomen and pelvis. There is retained stool in the right colon. Normal appendix on coronal image 45. Cecum appears mildly distended with contrast. Terminal ileum is decompressed. No dilated small bowel. Negative stomach and duodenum. No free air. No abdominal free fluid. Vascular/Lymphatic: Aortoiliac calcified atherosclerosis. Major arterial structures in the abdomen and pelvis are patent. Portal venous system is patent. No lymphadenopathy. Reproductive: Small uterine fibroids, otherwise within normal limits. Other: There is trace free fluid in the cul-de-sac on series 5, image 73. Musculoskeletal: Lytic lesion with posterior expansion, retropulsion of tumor from the T11 vertebral body (sagittal image 58). Mild to moderate associated spinal stenosis there (series 5, image 19). Mild associated T11 loss of height, compression. Right side T8 vertebral body roughly 11 mm lytic lesion is also new since 2019 on sagittal image 57. Similar lytic lesions in the L4 and S1 bodies. Visible ribs appear intact. There is a 2 cm lytic lesion of the medial left iliac bone with probable early extraosseous extension on series 5, image 60. Elsewhere the pelvis appears intact. IMPRESSION: 1. Fairly widespread osseous metastatic disease, with lytic lesions in the spine and pelvis. Pathologic T11 compression fracture with epidural extension of tumor resulting in mild to moderate spinal stenosis. If there are myelopathic symptoms consider T11 cord compression. 2. Heterogeneous enhancement of the liver is new since 2019. The appearance is infiltrative and in light of #1 hepatic metastatic disease cannot be excluded although no discrete liver mass. Alternatively consider hepatitis or  other intrinsic hepatocellular disease. 3. Small layering pleural effusions. Trace free fluid in the pelvis. 4. Small 1.8 cm cystic mass at the tail of the pancreas, with some background pancreatic atrophy. This is indeterminate and meets consensus criteria for re-imaging q6 months x4 to evaluate growth versus stability. 5. Aortic Atherosclerosis (ICD10-I70.0). Electronically Signed   By: Genevie Ann M.D.   On: 05/03/2021 04:59    EKG:  Independently reviewed.  Normal sinus rhythm at 77 bpm with rightward axis   Assessment/Plan  Acute blood loss anemia secondary to GI bleed: Patient presented with complaints of weakness.  Hemoglobin 6.9 with MCV 72.3 and MCH 20.9, but had been 8.3 on 11/21 and prior to that within normal limits.  Stool guaiacs were noted to be positive.  No significant NSAID use reported, but takes a daily aspirin.  Patient had been typed and screened, but declined any blood transfusions at this time as she did not feel warranted.  No patient's son stated something about her being worried about hepatitis,  but reassured him that we checked blood products for this.  She had already been typed and screened. -Admit to telemetry bed -Clear liquid diet for now -Hold aspirin -Continue to monitor H&H -Protonix IV -Reevaluate need of blood products -GI consulted, will follow-up for any further recommendation  Lung Mass   pancreatic lesion: Acute.  Patient has had a persistent cough over the last 3 to 4 months and also noted epigastric discomfort.  Chest x-ray noted a 3.9 x 3 cm right lung mass, 1.8 cm cystic mass at the tail of the pancreas, and concern for infiltrative changes of the liver.  Patient had prior history of TB that was reported treated in childhood. -Check CT scan of the chest with contrast -Consult to pulmonology for possible bronchoscopy and biopsy  Metastatic disease  pathologic compression fracture T11: Patient reported to have possibly fallen by son.  Found to have  metastatic disease to bone with pathologic compression fracture of T11. -Hydrocodone as needed for pain  -PT/OT to eval and treat -Palliative care consulted  Leukocytosis SIRS: WBC elevated 14 with tachypnea meeting SIRS criteria.  Question if this is related with fracture.  No clear signs of infection noted at this time on chest x-ray.  Urinalysis noted large leukocytes, rare bacteria, 7-10 WBCs/hpf. -Continue to monitor  Hypertensive urgency: Acute.  On admission blood pressures elevated up to 193/72.  Home blood pressure regimen includes atenolol 100 mg twice daily and Maxide 37.5-25 mg daily.  It seems she previously had been on clonidine but this was stopped at some point in time. -Continue atenolol -Hydralazine IV as needed  Elevated troponin: Acute.  High-sensitivity troponins 32->39.  Denies any complaints of chest pain at this time.  EKG without significant ischemic changes.  Possibly related with demand in setting of elevated blood pressures. -Continue to monitor  Hyponatremia  hypochloremia: Acute.  Sodium noted to be as low as 128 with chloride 94 on admission.  Note she is on triamterene hydrochlorothiazide which likely is contributing to symptoms in addition to her having decreased p.o. intake and this lung mass. -Continue normal saline IV fluids at 75 mL/h -Serial monitoring of sodium levels  Abnormal UA: Patient was noted to have large leukocytes, but rare bacteria and only 7-10 WBCs.  No significant complaints of dysuria. -Follow-up urine culture  Hypokalemia: Acute.  Sodium noted to be 3.4.  Given 40 mEq of potassium chloride.  Likely related with diuretic patient takes for blood pressure. -Continue to monitor and replace as needed  Thrombocytosis: Platelet count elevated at 629.  Suspect likely reactive in nature.   -Continue to monitor  History of breast cancer: Patient had been previously followed by Dr. Jana Hakim for malignant phyllodes tumor and underwent left  mastectomy with radiation therapy. -Will need consult to oncology once able to obtain tissue biopsy  Hypoalbuminemia: Acute.  Patient notes history of poor appetite over  the last 3 months.  Albumin noted to be low at 3.1. -Add-on prealbumin  DVT prophylaxis: SCDs Code Status: Full Family Communication: Son updated over the phone Disposition Plan: To be determined Consults called: Pulmonology, GI, palliative Admission status: Inpatient, require more than 2 midnight stay the setting of hyponatremia with concern for malignancy with metastatic lesion  Norval Morton MD [facility]   If 7PM-7AM, please contact night-coverage   05/03/2021, 7:11 AM

## 2021-05-03 NOTE — Consult Note (Addendum)
[facility] Consult: 11:16 AM 05/03/2021  LOS: 0 days    Referring Provider: Dr. Harvest Forest Primary Care Physician:  Alain Marion, Evie Lacks, MD Primary Gastroenterologist: None.    Reason for Consultation:  microcytic anemia.  FOBT +   HPI: Robin Estrada is a 75 y.o. female.  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  Mitral valve prolapse.  Hypertension.  Tuberculosis treated as a child  No previous GI interactions or issues.  No previous EGD or colonoscopy.    Several weeks of progressive "feeling badly".  Progressive postprandial vomiting, estimates 15 pound weight loss.  No dysphagia.  Less frequent bowel movements, previously daily, now 2-3 times a week.  No bloody or black stools.  No unusual or excessive bleeding or bruising.  Dizziness.  Some shortness of breath with exertion.  Worsening mid to lower back pain.  Using Tylenol without relief. After office visit with PCP, symptoms, which were milder at the time, are attributed to post COVID sequela.  Video visit with PCP 03/11/2020.    Dr. Alain Marion prescribed hydrocodone cough syrup, Z-Pak, COVID IgG (was negative).  Patient declined chest x-ray.  Came to ED with progressive symptoms.  Lab work with hgb 6.9, 1 month ago Hgb was 8.3, 9 months ago it was 13.2.  MCV 72.  Elevated platelets, WBCs 14.   FOBT + Sodium 128.  Potassium 3.4.  Glucose 151.  BUN actually below normal at 7, normal creatinine. Other than albumin 3.1, LFTs are normal. Iron normal 37, low iron sats at 10%.  Ferritin 22.  Normal TIBC, folate, B12.  Review of chest with a mass in her right upper lobe. CT chest, abdomen, pelvis: Widespread mets with lytic lesions in the spine and pelvis, T11 compression fracture with possible cord compression.  New enhancement in the liver with  infiltrative appearance but no distinct liver masses.  Pleural effusions.  Indeterminate 1.8 cm cystic mass in the tail of pancreas and pancreatic atrophy.  CT of head is nonacute, no evidence for mets and normal for age study  Broached the idea that patient might need colonoscopy and EGD.  She is reluctant to undergo colonoscopy but agreeable to EGD.  Family history of breast cancer in a sister.  No history of liver disease, colorectal cancers, ulcers, anemia. Patient does not drink alcohol.  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    Past Medical History:  Diagnosis Date   Breast mass, left    Large   Cancer ([place])    FIBROMYOMA  AGE 47-   BENIGN  RESOLVED   Headache    Heart murmur    Hypertension    Mitral valve prolapse    Tuberculosis    AS CHILD  W/ TX    Past Surgical History:  Procedure Laterality Date   MASS EXCISION Left 08/14/2017   Procedure: PARTIAL EXCISION  LEFT BREAST  MASS ERAS PATHWAY;  Surgeon: Fanny Skates, MD;  Location: [facility];  Service: General;  Laterality: Left;   MASTECTOMY W/ SENTINEL NODE BIOPSY Left 09/14/2017   Procedure: LEFT TOTAL MASTECTOMY  WITH SENTINEL LYMPH NODE BIOPSY;  Surgeon: Fanny Skates, MD;  Location: [facility];  Service: General;  Laterality: Left;   NO PAST SURGERIES      Prior to Admission medications   Medication Sig Start Date End Date Taking? Authorizing Provider  acetaminophen (TYLENOL) 500 MG tablet Take 1,000 mg by mouth every 6 (six) hours as needed for headache.   Yes [provider]  aspirin 81 MG tablet Take 81 mg by mouth daily. 04/29/18  Yes Magrinat, Virgie Dad, MD  atenolol (TENORMIN) 100 MG tablet Take 1 tablet (100 mg total) by mouth 2 (two) times daily. 08/02/20  Yes Plotnikov, Evie Lacks, MD  cholecalciferol (VITAMIN D) 1000 units tablet Take 1 tablet (1,000 Units total) by mouth daily. 08/01/16  Yes Plotnikov, Evie Lacks, MD  triamterene-hydrochlorothiazide (MAXZIDE-25) 37.5-25 MG tablet Take 1 tablet by mouth daily. 08/30/20  Yes  Plotnikov, Evie Lacks, MD  vitamin C (ASCORBIC ACID) 500 MG tablet Take 1 tablet (500 mg total) by mouth every other day. 04/28/18  Yes Magrinat, Virgie Dad, MD  azithromycin (ZITHROMAX Z-PAK) 250 MG tablet As directed Patient not taking: Reported on 05/03/2021 03/11/21   Plotnikov, Evie Lacks, MD  cloNIDine (CATAPRES) 0.1 MG tablet Take 1 tablet (0.1 mg total) by mouth 3 (three) times daily as needed (if systolic WI>203). Patient not taking: Reported on 05/03/2021 11/29/20   Plotnikov, Evie Lacks, MD  cloNIDine (CATAPRES-TTS-1) 0.1 mg/24hr patch Place 1 patch (0.1 mg total) onto the skin once a week. Patient not taking: Reported on 05/03/2021 03/11/21   Plotnikov, Evie Lacks, MD  HYDROcodone bit-homatropine (HYCODAN) 5-1.5 MG/5ML syrup Take 5 mLs by mouth every 6 (six) hours as needed for cough. Patient not taking: Reported on 05/03/2021 03/11/21   Plotnikov, Evie Lacks, MD  MegaRed Omega-3 Krill Oil 500 MG CAPS Take 1 capsule by mouth every morning. Patient not taking: Reported on 05/03/2021 08/02/20   Plotnikov, Evie Lacks, MD    Scheduled Meds:  atenolol  100 mg Oral BID   pantoprazole  40 mg Intravenous Q12H   potassium chloride  40 mEq Oral STAT   sodium chloride flush  3 mL Intravenous Q12H   Infusions:  sodium chloride     PRN Meds: acetaminophen **OR** acetaminophen, albuterol, hydrALAZINE, HYDROcodone-acetaminophen, ondansetron **OR** ondansetron (ZOFRAN) IV   Allergies as of 05/02/2021 - Review Complete 05/02/2021  Allergen Reaction Noted   Hydrocodone-acetaminophen Nausea Only 11/02/2017   Statins Nausea And Vomiting and Other (See Comments) 08/01/2016   Amlodipine  11/29/2020   Losartan  09/29/2018   Sulfa antibiotics Nausea Only 09/01/2017   Sulfamethoxazole-trimethoprim Nausea Only 09/01/2017    Family History  Problem Relation Age of Onset   Stroke Mother    Stroke Maternal Grandmother    Breast cancer Sister     Social History   Socioeconomic History   Marital  status: Divorced    Spouse name: Not on file   Number of children: 2   Years of education: Not on file   Highest education level: Not on file  Occupational History   Not on file  Tobacco Use   Smoking status: Never   Smokeless tobacco: Never  Vaping Use   Vaping Use: Never used  Substance and Sexual Activity   Alcohol use: Yes    Alcohol/week: 0.0 standard drinks    Comment: OCC   Drug use: No   Sexual activity: Not on file  Other Topics Concern   Not on file  Social History Narrative  02-15-18 Unable to ask abuse questions son with her today.   Social Determinants of Health   Financial Resource Strain: Not on file  Food Insecurity: Not on file  Transportation Needs: Not on file  Physical Activity: Not on file  Stress: Not on file  Social Connections: Not on file  Intimate Partner Violence: Not on file    REVIEW OF SYSTEMS: See HPI for pertinent details of review of systems. No oliguria, no dark urine.  No unusual bleeding or bruising.  No limb swelling.  No vision change.  No syncope.   PHYSICAL EXAM: Vital signs in last 24 hours: Vitals:   05/03/21 0545 05/03/21 0715  BP: (!) 175/57 (!) 163/89  Pulse: 67 80  Resp: (!) 25 (!) 23  Temp:    SpO2: 96% 99%   Wt Readings from Last 3 Encounters:  05/02/21 67.1 kg  11/29/20 77.6 kg  08/30/20 80.6 kg   Patient interviewed with video based translation in this [place] speaking patient General: Patient is pale but comfortable resting on the bed.  Gets uncomfortable with movement.  Alert Head: No facial asymmetry or swelling.  No signs of head trauma. Eyes: Conjunctiva slightly pale. Ears: Not hard of hearing Nose: No congestion or discharge Mouth: Good dentition.  Oral mucosa moist, pink, clear. Neck: No JVD, no masses, no thyromegaly Lungs: Clear bilaterally Heart: RRR.  No MRG.  S1, S2 present Abdomen: Soft.  Not tender, not distended.   Rectal: No masses, external hemorrhoidal tags.  No stool or fecal debris  to Hemoccult Musc/Skeltl: No gross joint deformities, swelling or redness Extremities: No CCE Neurologic: Oriented x3.  Appropriate.  Moves all 4 limbs with full strength.  No tremors. Skin: Pale.  No suspicious lesions Tattoos: None Nodes: No cervical adenopathy Psych: Calm, cooperative, pleasant.  Intake/Output from previous day: No intake/output data recorded. Intake/Output this shift: No intake/output data recorded.  LAB RESULTS: Recent Labs    05/02/21 2343  WBC 14.0*  HGB 6.9*  HCT 23.2*  PLT 679*   BMET Lab Results  Component Value Date   NA 128 (L) 05/02/2021   NA 133 (L) 04/01/2021   NA 140 08/30/2020   K 3.4 (L) 05/02/2021   K 3.4 (L) 04/01/2021   K 4.8 08/30/2020   CL 94 (L) 05/02/2021   CL 97 04/01/2021   CL 102 08/30/2020   CO2 22 05/02/2021   CO2 24 04/01/2021   CO2 31 08/30/2020   GLUCOSE 151 (H) 05/02/2021   GLUCOSE 111 (H) 04/01/2021   GLUCOSE 122 (H) 08/30/2020   BUN 7 (L) 05/02/2021   BUN 13 04/01/2021   BUN 12 08/30/2020   CREATININE 0.62 05/02/2021   CREATININE 0.96 04/01/2021   CREATININE 0.83 08/30/2020   CALCIUM 8.9 05/02/2021   CALCIUM 9.3 04/01/2021   CALCIUM 9.8 08/30/2020   LFT Recent Labs    05/02/21 2343  PROT 6.7  ALBUMIN 3.1*  AST 18  ALT 16  ALKPHOS 103  BILITOT 0.5   PT/INR Lab Results  Component Value Date   INR 0.98 09/14/2017   Hepatitis Panel No results for input(s): HEPBSAG, HCVAB, HEPAIGM, HEPBIGM in the last 72 hours. C-Diff No components found for: CDIFF Lipase     Component Value Date/Time   LIPASE 30 05/02/2021 2343    Drugs of Abuse  No results found for: LABOPIA, COCAINSCRNUR, LABBENZ, AMPHETMU, THCU, LABBARB   RADIOLOGY STUDIES: DG Chest 2 View  Result Date: 05/03/2021 CLINICAL DATA:  Weakness history of breast  mass, initial encounter EXAM: CHEST - 2 VIEW COMPARISON:  11/09/2017 FINDINGS: Check shadow is mildly enlarged. The lungs are well aerated bilaterally. There is a 3.9 x 3.0 cm  rounded soft tissue mass lesion projecting in the posterior aspect of the right upper lobe along the major fissure. This was not seen on the prior exam and is felt to represent a primary pulmonary neoplasm till proven otherwise. No bony abnormality is noted. IMPRESSION: Right upper lobe mass. CT of the chest with contrast is recommended for further evaluation. Electronically Signed   By: Inez Catalina M.D.   On: 05/03/2021 00:09   CT Head Wo Contrast  Result Date: 05/03/2021 CLINICAL DATA:  75 year old female with headache weakness, decreased P.O. Right side flank pain for 3 months. EXAM: CT HEAD WITHOUT CONTRAST TECHNIQUE: Contiguous axial images were obtained from the base of the skull through the vertex without intravenous contrast. COMPARISON:  Head CT 06/02/2015. FINDINGS: Brain: Cerebral volume is within normal limits for age. No midline shift, ventriculomegaly, mass effect, evidence of mass lesion, intracranial hemorrhage or evidence of cortically based acute infarction. Gray-white matter differentiation is within normal limits throughout the brain. Vascular: Mild Calcified atherosclerosis at the skull base. No suspicious intracranial vascular hyperdensity. Skull: Stable, negative. Sinuses/Orbits: Visualized paranasal sinuses and mastoids are clear. Tympanic cavities are clear. Other: Visualized orbits and scalp soft tissues are within normal limits. IMPRESSION: Normal for age noncontrast Head CT. Electronically Signed   By: Genevie Ann M.D.   On: 05/03/2021 04:49   CT CHEST W CONTRAST  Result Date: 05/03/2021 CLINICAL DATA:  Concern for lung nodule.  Abnormal x-ray EXAM: CT CHEST WITH CONTRAST TECHNIQUE: Multidetector CT imaging of the chest was performed during intravenous contrast administration. CONTRAST:  82mL OMNIPAQUE IOHEXOL 300 MG/ML  SOLN COMPARISON:  Chest x-ray earlier the same day FINDINGS: Cardiovascular: Heart is enlarged. No pericardial effusion identified. Main pulmonary artery is normal  caliber. Thoracic aorta is normal caliber with mild atherosclerotic plaques. Mediastinum/Nodes: A few enlarged mediastinal lymph nodes measuring up to 11 mm in short axis in the superior mediastinum and 10 mm in short axis precarinal. Enlarged right hilar lymph node measuring 11 mm in short axis. No axillary lymphadenopathy identified. Subcentimeter right thyroid lobe nodule noted. Lungs/Pleura: Lungs are hyperinflated with mild emphysematous changes and mild hazy mosaic attenuation throughout. There is a solid centrally necrotic mass identified in the posterior aspect of the right upper lobe which measures 2.7 x 3.8 x 2.8 cm and partially abuts the major fissure and the posterolateral pleura. The mass demonstrates lobulated slightly irregular borders. 6 mm pleural-based pulmonary nodule in the posterior aspect of the right upper lobe, superior to the mass. A few small adjacent pulmonary nodular densities in the left upper lobe subpleural measuring up to 4 mm in size. Small right and trace left pleural effusions. No pneumothorax. Upper Abdomen: No acute process identified in the visualized upper abdomen. Nodular contour of the liver suggesting cirrhosis. At least 3 cystic density lesions in the pancreas measuring up to 16 mm in the tail the pancreas. Musculoskeletal: Multiple lytic likely metastatic bone lesions identified in the spine and ribs. Largest lytic lesion is in the T11 vertebral body which is expansile and extends 8 mm dorsal to the posterior vertebral body margin and causes moderate to severe spinal canal stenosis. IMPRESSION: 1. Solid and centrally necrotic lung mass in the right upper lobe as described, consistent with primary malignancy. 2. Emphysematous changes of the lungs. A few additional small pulmonary  nodules bilaterally. 3. Small right and trace left pleural effusions. 4. Mediastinal and right hilar mild adenopathy, possibly metastatic. 5. Multiple lytic osseous bone metastases. Most notably a  dorsally expansile lesion in the T11 vertebral body which causes moderate to severe spinal canal stenosis. Correlate clinically for need for MRI evaluation. 6. Chronic findings in the abdomen including hepatic cirrhosis and at least 3 hypodense cystic lesions in the pancreas which may represent cysts, pseudocysts, or side branch IPMN. Aortic Atherosclerosis (ICD10-I70.0) and Emphysema (ICD10-J43.9). Electronically Signed   By: Ofilia Neas M.D.   On: 05/03/2021 10:34   CT ABDOMEN PELVIS W CONTRAST  Result Date: 05/03/2021 CLINICAL DATA:  74 year old female with headache weakness, decreased P.O. Right side flank pain for 3 months. 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 EXAM: CT ABDOMEN AND PELVIS WITH CONTRAST TECHNIQUE: Multidetector CT imaging of the abdomen and pelvis was performed using the standard protocol following bolus administration of intravenous contrast. CONTRAST:  115mL OMNIPAQUE IOHEXOL 300 MG/ML  SOLN COMPARISON:  Chest CT 11/10/2017. FINDINGS: Lower chest: Small layering pleural effusions are new since 2019, larger on the right with simple fluid density. No pericardial effusion. Cardiac size now at the upper limits of normal. Mild lung base atelectasis. Hepatobiliary: Coarse, heterogeneous liver enhancement is new since 2019. This has an infiltrative appearance, without a discrete liver mass or lesion. Gallbladder remains within normal limits. Pancreas: Small circumscribed simple fluid density cystic mass at the tail of the pancreas is 18 mm on series 5, image 27. Elsewhere the pancreatic parenchyma appears somewhat atrophied. No regional inflammation. Spleen: Negative. Adrenals/Urinary Tract: Normal adrenal glands. Symmetric renal enhancement and contrast excretion. Distended but otherwise unremarkable bladder. Stomach/Bowel: Redundant but decompressed large bowel in the abdomen and pelvis. There is retained stool in the right colon.  Normal appendix on coronal image 45. Cecum appears mildly distended with contrast. Terminal ileum is decompressed. No dilated small bowel. Negative stomach and duodenum. No free air. No abdominal free fluid. Vascular/Lymphatic: Aortoiliac calcified atherosclerosis. Major arterial structures in the abdomen and pelvis are patent. Portal venous system is patent. No lymphadenopathy. Reproductive: Small uterine fibroids, otherwise within normal limits. Other: There is trace free fluid in the cul-de-sac on series 5, image 73. Musculoskeletal: Lytic lesion with posterior expansion, retropulsion of tumor from the T11 vertebral body (sagittal image 58). Mild to moderate associated spinal stenosis there (series 5, image 19). Mild associated T11 loss of height, compression. Right side T8 vertebral body roughly 11 mm lytic lesion is also new since 2019 on sagittal image 57. Similar lytic lesions in the L4 and S1 bodies. Visible ribs appear intact. There is a 2 cm lytic lesion of the medial left iliac bone with probable early extraosseous extension on series 5, image 60. Elsewhere the pelvis appears intact. IMPRESSION: 1. Fairly widespread osseous metastatic disease, with lytic lesions in the spine and pelvis. Pathologic T11 compression fracture with epidural extension of tumor resulting in mild to moderate spinal stenosis. If there are myelopathic symptoms consider T11 cord compression. 2. Heterogeneous enhancement of the liver is new since 2019. The appearance is infiltrative and in light of #1 hepatic metastatic disease cannot be excluded although no discrete liver mass. Alternatively consider hepatitis or other intrinsic hepatocellular disease. 3. Small layering pleural effusions. Trace free fluid in the pelvis. 4. Small 1.8 cm cystic mass at the tail of the pancreas, with some background pancreatic atrophy. This is indeterminate and meets consensus criteria for re-imaging q6 months  x4 to evaluate growth versus stability. 5.  Aortic Atherosclerosis (ICD10-I70.0). Electronically Signed   By: Genevie Ann M.D.   On: 05/03/2021 04:59      IMPRESSION:      Lung mass.  Lytic spinal/pelvic lesions with T11 compression fracture, possible cord compression.  Infiltrative appearance to liver but no specific masses.  Indeterminant cystic mass tail of pancreas.       Postprandial nausea, vomiting and early satiety.  History of breast cancer 2019.  Progressive anemia, microcytic.  Iron studies with low iron saturation and low normal iron and ferritin.  Anemia first noted and now progressive from a month ago.    PLAN:      EGD??, Dr Jerilynn Mages to see pt.  Again pt not willing to undergo colonoscopy due to back pain and not wanting to go through the bowel prep.  Leave Protonix 40 mg iv bid in place.  Leave clear liquid diet in place.   Azucena Freed  05/03/2021, 11:16 AM Phone [phone]

## 2021-05-03 NOTE — H&P (View-Only) (Signed)
[facility] Consult: 11:16 AM 05/03/2021  LOS: 0 days    Referring Provider: Dr. Harvest Forest Primary Care Physician:  Alain Marion, Evie Lacks, MD Primary Gastroenterologist: None.    Reason for Consultation:  microcytic anemia.  FOBT +   HPI: Robin Estrada is a 75 y.o. female.  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  Mitral valve prolapse.  Hypertension.  Tuberculosis treated as a child  No previous GI interactions or issues.  No previous EGD or colonoscopy.    Several weeks of progressive "feeling badly".  Progressive postprandial vomiting, estimates 15 pound weight loss.  No dysphagia.  Less frequent bowel movements, previously daily, now 2-3 times a week.  No bloody or black stools.  No unusual or excessive bleeding or bruising.  Dizziness.  Some shortness of breath with exertion.  Worsening mid to lower back pain.  Using Tylenol without relief. After office visit with PCP, symptoms, which were milder at the time, are attributed to post COVID sequela.  Video visit with PCP 03/11/2020.    Dr. Alain Marion prescribed hydrocodone cough syrup, Z-Pak, COVID IgG (was negative).  Patient declined chest x-ray.  Came to ED with progressive symptoms.  Lab work with hgb 6.9, 1 month ago Hgb was 8.3, 9 months ago it was 13.2.  MCV 72.  Elevated platelets, WBCs 14.   FOBT + Sodium 128.  Potassium 3.4.  Glucose 151.  BUN actually below normal at 7, normal creatinine. Other than albumin 3.1, LFTs are normal. Iron normal 37, low iron sats at 10%.  Ferritin 22.  Normal TIBC, folate, B12.  Review of chest with a mass in her right upper lobe. CT chest, abdomen, pelvis: Widespread mets with lytic lesions in the spine and pelvis, T11 compression fracture with possible cord compression.  New enhancement in the liver with  infiltrative appearance but no distinct liver masses.  Pleural effusions.  Indeterminate 1.8 cm cystic mass in the tail of pancreas and pancreatic atrophy.  CT of head is nonacute, no evidence for mets and normal for age study  Broached the idea that patient might need colonoscopy and EGD.  She is reluctant to undergo colonoscopy but agreeable to EGD.  Family history of breast cancer in a sister.  No history of liver disease, colorectal cancers, ulcers, anemia. Patient does not drink alcohol.  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    Past Medical History:  Diagnosis Date   Breast mass, left    Large   Cancer ([place])    FIBROMYOMA  AGE 25-   BENIGN  RESOLVED   Headache    Heart murmur    Hypertension    Mitral valve prolapse    Tuberculosis    AS CHILD  W/ TX    Past Surgical History:  Procedure Laterality Date   MASS EXCISION Left 08/14/2017   Procedure: PARTIAL EXCISION  LEFT BREAST  MASS ERAS PATHWAY;  Surgeon: Fanny Skates, MD;  Location: [place];  Service: General;  Laterality: Left;   MASTECTOMY W/ SENTINEL NODE BIOPSY Left 09/14/2017   Procedure: LEFT TOTAL MASTECTOMY  WITH SENTINEL LYMPH NODE BIOPSY;  Surgeon: Fanny Skates, MD;  Location: [place];  Service: General;  Laterality: Left;   NO PAST SURGERIES      Prior to Admission medications   Medication Sig Start Date End Date Taking? Authorizing Provider  acetaminophen (TYLENOL) 500 MG tablet Take 1,000 mg by mouth every 6 (six) hours as needed for headache.   Yes [provider]  aspirin 81 MG tablet Take 81 mg by mouth daily. 04/29/18  Yes Magrinat, Virgie Dad, MD  atenolol (TENORMIN) 100 MG tablet Take 1 tablet (100 mg total) by mouth 2 (two) times daily. 08/02/20  Yes Plotnikov, Evie Lacks, MD  cholecalciferol (VITAMIN D) 1000 units tablet Take 1 tablet (1,000 Units total) by mouth daily. 08/01/16  Yes Plotnikov, Evie Lacks, MD  triamterene-hydrochlorothiazide (MAXZIDE-25) 37.5-25 MG tablet Take 1 tablet by mouth daily. 08/30/20  Yes  Plotnikov, Evie Lacks, MD  vitamin C (ASCORBIC ACID) 500 MG tablet Take 1 tablet (500 mg total) by mouth every other day. 04/28/18  Yes Magrinat, Virgie Dad, MD  azithromycin (ZITHROMAX Z-PAK) 250 MG tablet As directed Patient not taking: Reported on 05/03/2021 03/11/21   Plotnikov, Evie Lacks, MD  cloNIDine (CATAPRES) 0.1 MG tablet Take 1 tablet (0.1 mg total) by mouth 3 (three) times daily as needed (if systolic JQ>734). Patient not taking: Reported on 05/03/2021 11/29/20   Plotnikov, Evie Lacks, MD  cloNIDine (CATAPRES-TTS-1) 0.1 mg/24hr patch Place 1 patch (0.1 mg total) onto the skin once a week. Patient not taking: Reported on 05/03/2021 03/11/21   Plotnikov, Evie Lacks, MD  HYDROcodone bit-homatropine (HYCODAN) 5-1.5 MG/5ML syrup Take 5 mLs by mouth every 6 (six) hours as needed for cough. Patient not taking: Reported on 05/03/2021 03/11/21   Plotnikov, Evie Lacks, MD  MegaRed Omega-3 Krill Oil 500 MG CAPS Take 1 capsule by mouth every morning. Patient not taking: Reported on 05/03/2021 08/02/20   Plotnikov, Evie Lacks, MD    Scheduled Meds:  atenolol  100 mg Oral BID   pantoprazole  40 mg Intravenous Q12H   potassium chloride  40 mEq Oral STAT   sodium chloride flush  3 mL Intravenous Q12H   Infusions:  sodium chloride     PRN Meds: acetaminophen **OR** acetaminophen, albuterol, hydrALAZINE, HYDROcodone-acetaminophen, ondansetron **OR** ondansetron (ZOFRAN) IV   Allergies as of 05/02/2021 - Review Complete 05/02/2021  Allergen Reaction Noted   Hydrocodone-acetaminophen Nausea Only 11/02/2017   Statins Nausea And Vomiting and Other (See Comments) 08/01/2016   Amlodipine  11/29/2020   Losartan  09/29/2018   Sulfa antibiotics Nausea Only 09/01/2017   Sulfamethoxazole-trimethoprim Nausea Only 09/01/2017    Family History  Problem Relation Age of Onset   Stroke Mother    Stroke Maternal Grandmother    Breast cancer Sister     Social History   Socioeconomic History   Marital  status: Divorced    Spouse name: Not on file   Number of children: 2   Years of education: Not on file   Highest education level: Not on file  Occupational History   Not on file  Tobacco Use   Smoking status: Never   Smokeless tobacco: Never  Vaping Use   Vaping Use: Never used  Substance and Sexual Activity   Alcohol use: Yes    Alcohol/week: 0.0 standard drinks    Comment: OCC   Drug use: No   Sexual activity: Not on file  Other Topics Concern   Not on file  Social History Narrative  02-15-18 Unable to ask abuse questions son with her today.   Social Determinants of Health   Financial Resource Strain: Not on file  Food Insecurity: Not on file  Transportation Needs: Not on file  Physical Activity: Not on file  Stress: Not on file  Social Connections: Not on file  Intimate Partner Violence: Not on file    REVIEW OF SYSTEMS: See HPI for pertinent details of review of systems. No oliguria, no dark urine.  No unusual bleeding or bruising.  No limb swelling.  No vision change.  No syncope.   PHYSICAL EXAM: Vital signs in last 24 hours: Vitals:   05/03/21 0545 05/03/21 0715  BP: (!) 175/57 (!) 163/89  Pulse: 67 80  Resp: (!) 25 (!) 23  Temp:    SpO2: 96% 99%   Wt Readings from Last 3 Encounters:  05/02/21 67.1 kg  11/29/20 77.6 kg  08/30/20 80.6 kg   Patient interviewed with video based translation in this [place] speaking patient General: Patient is pale but comfortable resting on the bed.  Gets uncomfortable with movement.  Alert Head: No facial asymmetry or swelling.  No signs of head trauma. Eyes: Conjunctiva slightly pale. Ears: Not hard of hearing Nose: No congestion or discharge Mouth: Good dentition.  Oral mucosa moist, pink, clear. Neck: No JVD, no masses, no thyromegaly Lungs: Clear bilaterally Heart: RRR.  No MRG.  S1, S2 present Abdomen: Soft.  Not tender, not distended.   Rectal: No masses, external hemorrhoidal tags.  No stool or fecal debris  to Hemoccult Musc/Skeltl: No gross joint deformities, swelling or redness Extremities: No CCE Neurologic: Oriented x3.  Appropriate.  Moves all 4 limbs with full strength.  No tremors. Skin: Pale.  No suspicious lesions Tattoos: None Nodes: No cervical adenopathy Psych: Calm, cooperative, pleasant.  Intake/Output from previous day: No intake/output data recorded. Intake/Output this shift: No intake/output data recorded.  LAB RESULTS: Recent Labs    05/02/21 2343  WBC 14.0*  HGB 6.9*  HCT 23.2*  PLT 679*   BMET Lab Results  Component Value Date   NA 128 (L) 05/02/2021   NA 133 (L) 04/01/2021   NA 140 08/30/2020   K 3.4 (L) 05/02/2021   K 3.4 (L) 04/01/2021   K 4.8 08/30/2020   CL 94 (L) 05/02/2021   CL 97 04/01/2021   CL 102 08/30/2020   CO2 22 05/02/2021   CO2 24 04/01/2021   CO2 31 08/30/2020   GLUCOSE 151 (H) 05/02/2021   GLUCOSE 111 (H) 04/01/2021   GLUCOSE 122 (H) 08/30/2020   BUN 7 (L) 05/02/2021   BUN 13 04/01/2021   BUN 12 08/30/2020   CREATININE 0.62 05/02/2021   CREATININE 0.96 04/01/2021   CREATININE 0.83 08/30/2020   CALCIUM 8.9 05/02/2021   CALCIUM 9.3 04/01/2021   CALCIUM 9.8 08/30/2020   LFT Recent Labs    05/02/21 2343  PROT 6.7  ALBUMIN 3.1*  AST 18  ALT 16  ALKPHOS 103  BILITOT 0.5   PT/INR Lab Results  Component Value Date   INR 0.98 09/14/2017   Hepatitis Panel No results for input(s): HEPBSAG, HCVAB, HEPAIGM, HEPBIGM in the last 72 hours. C-Diff No components found for: CDIFF Lipase     Component Value Date/Time   LIPASE 30 05/02/2021 2343    Drugs of Abuse  No results found for: LABOPIA, COCAINSCRNUR, LABBENZ, AMPHETMU, THCU, LABBARB   RADIOLOGY STUDIES: DG Chest 2 View  Result Date: 05/03/2021 CLINICAL DATA:  Weakness history of breast  mass, initial encounter EXAM: CHEST - 2 VIEW COMPARISON:  11/09/2017 FINDINGS: Check shadow is mildly enlarged. The lungs are well aerated bilaterally. There is a 3.9 x 3.0 cm  rounded soft tissue mass lesion projecting in the posterior aspect of the right upper lobe along the major fissure. This was not seen on the prior exam and is felt to represent a primary pulmonary neoplasm till proven otherwise. No bony abnormality is noted. IMPRESSION: Right upper lobe mass. CT of the chest with contrast is recommended for further evaluation. Electronically Signed   By: Inez Catalina M.D.   On: 05/03/2021 00:09   CT Head Wo Contrast  Result Date: 05/03/2021 CLINICAL DATA:  75 year old female with headache weakness, decreased P.O. Right side flank pain for 3 months. EXAM: CT HEAD WITHOUT CONTRAST TECHNIQUE: Contiguous axial images were obtained from the base of the skull through the vertex without intravenous contrast. COMPARISON:  Head CT 06/02/2015. FINDINGS: Brain: Cerebral volume is within normal limits for age. No midline shift, ventriculomegaly, mass effect, evidence of mass lesion, intracranial hemorrhage or evidence of cortically based acute infarction. Gray-white matter differentiation is within normal limits throughout the brain. Vascular: Mild Calcified atherosclerosis at the skull base. No suspicious intracranial vascular hyperdensity. Skull: Stable, negative. Sinuses/Orbits: Visualized paranasal sinuses and mastoids are clear. Tympanic cavities are clear. Other: Visualized orbits and scalp soft tissues are within normal limits. IMPRESSION: Normal for age noncontrast Head CT. Electronically Signed   By: Genevie Ann M.D.   On: 05/03/2021 04:49   CT CHEST W CONTRAST  Result Date: 05/03/2021 CLINICAL DATA:  Concern for lung nodule.  Abnormal x-ray EXAM: CT CHEST WITH CONTRAST TECHNIQUE: Multidetector CT imaging of the chest was performed during intravenous contrast administration. CONTRAST:  45mL OMNIPAQUE IOHEXOL 300 MG/ML  SOLN COMPARISON:  Chest x-ray earlier the same day FINDINGS: Cardiovascular: Heart is enlarged. No pericardial effusion identified. Main pulmonary artery is normal  caliber. Thoracic aorta is normal caliber with mild atherosclerotic plaques. Mediastinum/Nodes: A few enlarged mediastinal lymph nodes measuring up to 11 mm in short axis in the superior mediastinum and 10 mm in short axis precarinal. Enlarged right hilar lymph node measuring 11 mm in short axis. No axillary lymphadenopathy identified. Subcentimeter right thyroid lobe nodule noted. Lungs/Pleura: Lungs are hyperinflated with mild emphysematous changes and mild hazy mosaic attenuation throughout. There is a solid centrally necrotic mass identified in the posterior aspect of the right upper lobe which measures 2.7 x 3.8 x 2.8 cm and partially abuts the major fissure and the posterolateral pleura. The mass demonstrates lobulated slightly irregular borders. 6 mm pleural-based pulmonary nodule in the posterior aspect of the right upper lobe, superior to the mass. A few small adjacent pulmonary nodular densities in the left upper lobe subpleural measuring up to 4 mm in size. Small right and trace left pleural effusions. No pneumothorax. Upper Abdomen: No acute process identified in the visualized upper abdomen. Nodular contour of the liver suggesting cirrhosis. At least 3 cystic density lesions in the pancreas measuring up to 16 mm in the tail the pancreas. Musculoskeletal: Multiple lytic likely metastatic bone lesions identified in the spine and ribs. Largest lytic lesion is in the T11 vertebral body which is expansile and extends 8 mm dorsal to the posterior vertebral body margin and causes moderate to severe spinal canal stenosis. IMPRESSION: 1. Solid and centrally necrotic lung mass in the right upper lobe as described, consistent with primary malignancy. 2. Emphysematous changes of the lungs. A few additional small pulmonary  nodules bilaterally. 3. Small right and trace left pleural effusions. 4. Mediastinal and right hilar mild adenopathy, possibly metastatic. 5. Multiple lytic osseous bone metastases. Most notably a  dorsally expansile lesion in the T11 vertebral body which causes moderate to severe spinal canal stenosis. Correlate clinically for need for MRI evaluation. 6. Chronic findings in the abdomen including hepatic cirrhosis and at least 3 hypodense cystic lesions in the pancreas which may represent cysts, pseudocysts, or side branch IPMN. Aortic Atherosclerosis (ICD10-I70.0) and Emphysema (ICD10-J43.9). Electronically Signed   By: Ofilia Neas M.D.   On: 05/03/2021 10:34   CT ABDOMEN PELVIS W CONTRAST  Result Date: 05/03/2021 CLINICAL DATA:  75 year old female with headache weakness, decreased P.O. Right side flank pain for 3 months. 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 EXAM: CT ABDOMEN AND PELVIS WITH CONTRAST TECHNIQUE: Multidetector CT imaging of the abdomen and pelvis was performed using the standard protocol following bolus administration of intravenous contrast. CONTRAST:  122mL OMNIPAQUE IOHEXOL 300 MG/ML  SOLN COMPARISON:  Chest CT 11/10/2017. FINDINGS: Lower chest: Small layering pleural effusions are new since 2019, larger on the right with simple fluid density. No pericardial effusion. Cardiac size now at the upper limits of normal. Mild lung base atelectasis. Hepatobiliary: Coarse, heterogeneous liver enhancement is new since 2019. This has an infiltrative appearance, without a discrete liver mass or lesion. Gallbladder remains within normal limits. Pancreas: Small circumscribed simple fluid density cystic mass at the tail of the pancreas is 18 mm on series 5, image 27. Elsewhere the pancreatic parenchyma appears somewhat atrophied. No regional inflammation. Spleen: Negative. Adrenals/Urinary Tract: Normal adrenal glands. Symmetric renal enhancement and contrast excretion. Distended but otherwise unremarkable bladder. Stomach/Bowel: Redundant but decompressed large bowel in the abdomen and pelvis. There is retained stool in the right colon.  Normal appendix on coronal image 45. Cecum appears mildly distended with contrast. Terminal ileum is decompressed. No dilated small bowel. Negative stomach and duodenum. No free air. No abdominal free fluid. Vascular/Lymphatic: Aortoiliac calcified atherosclerosis. Major arterial structures in the abdomen and pelvis are patent. Portal venous system is patent. No lymphadenopathy. Reproductive: Small uterine fibroids, otherwise within normal limits. Other: There is trace free fluid in the cul-de-sac on series 5, image 73. Musculoskeletal: Lytic lesion with posterior expansion, retropulsion of tumor from the T11 vertebral body (sagittal image 58). Mild to moderate associated spinal stenosis there (series 5, image 19). Mild associated T11 loss of height, compression. Right side T8 vertebral body roughly 11 mm lytic lesion is also new since 2019 on sagittal image 57. Similar lytic lesions in the L4 and S1 bodies. Visible ribs appear intact. There is a 2 cm lytic lesion of the medial left iliac bone with probable early extraosseous extension on series 5, image 60. Elsewhere the pelvis appears intact. IMPRESSION: 1. Fairly widespread osseous metastatic disease, with lytic lesions in the spine and pelvis. Pathologic T11 compression fracture with epidural extension of tumor resulting in mild to moderate spinal stenosis. If there are myelopathic symptoms consider T11 cord compression. 2. Heterogeneous enhancement of the liver is new since 2019. The appearance is infiltrative and in light of #1 hepatic metastatic disease cannot be excluded although no discrete liver mass. Alternatively consider hepatitis or other intrinsic hepatocellular disease. 3. Small layering pleural effusions. Trace free fluid in the pelvis. 4. Small 1.8 cm cystic mass at the tail of the pancreas, with some background pancreatic atrophy. This is indeterminate and meets consensus criteria for re-imaging q6 months  x4 to evaluate growth versus stability. 5.  Aortic Atherosclerosis (ICD10-I70.0). Electronically Signed   By: Genevie Ann M.D.   On: 05/03/2021 04:59      IMPRESSION:      Lung mass.  Lytic spinal/pelvic lesions with T11 compression fracture, possible cord compression.  Infiltrative appearance to liver but no specific masses.  Indeterminant cystic mass tail of pancreas.       Postprandial nausea, vomiting and early satiety.  History of breast cancer 2019.  Progressive anemia, microcytic.  Iron studies with low iron saturation and low normal iron and ferritin.  Anemia first noted and now progressive from a month ago.    PLAN:      EGD??, Dr Jerilynn Mages to see pt.  Again pt not willing to undergo colonoscopy due to back pain and not wanting to go through the bowel prep.  Leave Protonix 40 mg iv bid in place.  Leave clear liquid diet in place.   Azucena Freed  05/03/2021, 11:16 AM Phone [phone]

## 2021-05-03 NOTE — ED Notes (Signed)
Pt ambulated tp restroom with one stand by assist for safety. Pt slightly unsteady.

## 2021-05-03 NOTE — Consult Note (Signed)
NAMEMarayah Estrada, MRN:  [id number], DOB:  03-09-1946, LOS: 0 ADMISSION DATE:  05/02/2021, CONSULTATION DATE:  05/03/2021 REFERRING MD:  Norval Morton, MD, CHIEF COMPLAINT:  lung mass   History of Present Illness:  This is a 75 yo woman with past medical history of breast cancer s/p mastectomy and radiation in 2017 who presents with several months of decreased appetite, weight loss, nausea, not feeling well. She has had a couple outpatient treamtents with PCP for pneumonia vs URI with family to improve. Interview conducted with help of russian video interpreter. She denies currently any cough, shortness of breath or hemoptysis. She is a lifelong never smoker. She grew up in [place]. She presented with worsening weakness and anorexia today. She had a CT Chest A/P which shows right lung mass with hilar adenopathy and diffuse osseous metastatic disease. PCCM consulted to assist with diagnosis of lung mass - concerning for primary lung malignancy.   Pertinent  Medical History  Breast Cancer s/p mastectomy and radiation HTN HLD   Significant Hospital Events: Including procedures, antibiotic start and stop dates in addition to other pertinent events     Interim History / Subjective:    Objective   Blood pressure (!) 130/45, pulse 75, temperature 97.7 F (36.5 C), temperature source Oral, resp. rate 20, height 5' 0.63" (1.54 m), weight 67.1 kg, SpO2 96 %.       No intake or output data in the 24 hours ending 05/03/21 1348 Filed Weights   05/02/21 2325  Weight: 67.1 kg    Examination: General: chronically ill appearing, no acute distress HENT: mmm Lungs: diminished bilaterally Cardiovascular: RRR Abdomen: soft Extremities: no edema Neuro: normal speech, russian speaking, no focal asymmetry MSK:  no rashes   CT Chest A/P personally reviewed Shows right lung mass with hilar adenopathy and diffuse osseous metastatic disease.   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 Discussed that there is a need for tissue diagnosis before discussing prognosis, treatment options. Discussed bronchoscopy with [facility] with her. Discussed risks, benefits and alternatives. Percutaneous biopsy is not an option due to location, and bone biopsy will not provide genetic material for treatment. She is agreeable to proceed with bronchoscopy. Due to availability of cytology technicians for [facility], this will be Tuesday. We discussed going home and being scheduled as an outpatient and she is agreeable to proceed with waiting inpatient.    She is on the schedule and needs to be NPO at midnight on Monday night.   Lenice Llamas, MD Pulmonary and [place] 05/03/2021 1:59 PM Pager: see AMION  If no response to pager, please call critical care on call (see AMION) until 7pm After 7:00 pm call Elink     Best Practice (right click and "Reselect all SmartList Selections" daily)  Per primary  Labs   CBC: Recent Labs  Lab 05/02/21 2343  WBC 14.0*  NEUTROABS 11.6*  HGB 6.9*  HCT 23.2*  MCV 72.3*  PLT 679*    Basic Metabolic Panel: Recent Labs  Lab 05/02/21 2343  NA 128*  K 3.4*  CL 94*  CO2 22  GLUCOSE 151*  BUN 7*  CREATININE 0.62  CALCIUM 8.9  MG 1.9   GFR: Estimated Creatinine Clearance: 51.9 mL/min (by C-G formula  based on SCr of 0.62 mg/dL). Recent Labs  Lab 05/02/21 2343  WBC 14.0*    Liver Function Tests: Recent Labs  Lab 05/02/21 2343  AST 18  ALT 16  ALKPHOS 103  BILITOT 0.5  PROT 6.7  ALBUMIN 3.1*   Recent Labs  Lab 05/02/21 2343  LIPASE 30   No results for input(s): AMMONIA in the last 168 hours.  ABG No results found for: PHART, PCO2ART, PO2ART, HCO3, TCO2, ACIDBASEDEF, O2SAT    Coagulation Profile: No results for input(s): INR, PROTIME in the last 168 hours.  Cardiac Enzymes: No results for input(s): CKTOTAL, CKMB, CKMBINDEX, TROPONINI in the last 168 hours.  HbA1C: Hgb A1c MFr Bld  Date/Time Value Ref Range Status  08/30/2020 10:04 AM 6.4 4.6 - 6.5 % Final    Comment:    Glycemic Control Guidelines for People with Diabetes:Non Diabetic:  <6%Goal of Therapy: <7%Additional Action Suggested:  >8%   05/22/2017 08:26 AM 6.4 4.6 - 6.5 % Final    Comment:    Glycemic Control Guidelines for People with Diabetes:Non Diabetic:  <6%Goal of Therapy: <7%Additional Action Suggested:  >8%     CBG: No results for input(s): GLUCAP in the last 168 hours.  Review of Systems:   +weight loss, night sweats, decreased appetite +flank and back pain - dyspnea, hemoptysis - breast mass Otherwise 10 point ROS reviewed and negative  Past Medical History:  She,  has a past medical history of Breast mass, left, Cancer ([place]), Headache, Heart murmur, Hypertension, Mitral valve prolapse, and Tuberculosis.   Surgical History:   Past Surgical History:  Procedure Laterality Date   MASS EXCISION Left 08/14/2017   Procedure: PARTIAL EXCISION  LEFT BREAST  MASS ERAS PATHWAY;  Surgeon: Fanny Skates, MD;  Location: [facility];  Service: General;  Laterality: Left;   MASTECTOMY W/ SENTINEL NODE BIOPSY Left 09/14/2017   Procedure: LEFT TOTAL MASTECTOMY WITH SENTINEL LYMPH NODE BIOPSY;  Surgeon: Fanny Skates, MD;  Location: [facility];  Service: General;  Laterality: Left;   NO PAST SURGERIES       Social History:   reports that she has never smoked. She has never used smokeless tobacco. She reports current alcohol use. She reports that she does not use drugs.   Family History:  Her family history includes Breast cancer in her sister; Stroke in her maternal grandmother and mother.   Allergies Allergies  Allergen Reactions   Hydrocodone-Acetaminophen Nausea Only   Statins Nausea And  Vomiting and Other (See Comments)    MUSCLE PAIN   Amlodipine     fatigue   Clonidine Derivatives     headache   Losartan     abd pain   Sulfa Antibiotics Nausea Only   Sulfamethoxazole-Trimethoprim Nausea Only    " Severe Nausea "     Home Medications  Prior to Admission medications   Medication Sig Start Date End Date Taking? Authorizing Provider  acetaminophen (TYLENOL) 500 MG tablet Take 1,000 mg by mouth every 6 (six) hours as needed for headache.   Yes [provider]  aspirin 81 MG tablet Take 81 mg by mouth daily. 04/29/18  Yes Magrinat, Virgie Dad, MD  atenolol (TENORMIN) 100 MG tablet Take 1 tablet (100 mg total) by mouth 2 (two) times daily. 08/02/20  Yes Plotnikov, Evie Lacks, MD  cholecalciferol (VITAMIN D) 1000 units tablet Take 1 tablet (1,000 Units total) by mouth daily. 08/01/16  Yes Plotnikov, Evie Lacks, MD  triamterene-hydrochlorothiazide (MAXZIDE-25) 37.5-25 MG tablet Take 1  tablet by mouth daily. 08/30/20  Yes Plotnikov, Evie Lacks, MD  vitamin C (ASCORBIC ACID) 500 MG tablet Take 1 tablet (500 mg total) by mouth every other day. 04/28/18  Yes Magrinat, Virgie Dad, MD  azithromycin (ZITHROMAX Z-PAK) 250 MG tablet As directed Patient not taking: Reported on 05/03/2021 03/11/21   Plotnikov, Evie Lacks, MD  cloNIDine (CATAPRES) 0.1 MG tablet Take 1 tablet (0.1 mg total) by mouth 3 (three) times daily as needed (if systolic RN>165). Patient not taking: Reported on 05/03/2021 11/29/20   Plotnikov, Evie Lacks, MD  cloNIDine (CATAPRES-TTS-1) 0.1 mg/24hr patch Place 1 patch (0.1 mg total) onto the skin once a week. Patient not taking: Reported on 05/03/2021 03/11/21   Plotnikov, Evie Lacks, MD  HYDROcodone bit-homatropine (HYCODAN) 5-1.5 MG/5ML syrup Take 5 mLs by mouth every 6 (six) hours as needed for cough. Patient not taking: Reported on 05/03/2021 03/11/21   Plotnikov, Evie Lacks, MD  MegaRed Omega-3 Krill Oil 500 MG CAPS Take 1 capsule by mouth every morning. Patient not  taking: Reported on 05/03/2021 08/02/20   Plotnikov, Evie Lacks, MD

## 2021-05-03 NOTE — Progress Notes (Signed)
75 y.o. [place] speaking female in the ED. History of Malignant phyllodes tumor of the left breast s/p mastectomy. Presented to the ED with decreased PO intake, headache, weakness and right side flank pain X 3 months. She has a chest xray that shows a RUL mass. CT Chest ordered. After review of procedure request by IR Attending Dr. Annamaria Boots  Who recommend team consult for Pulmonology for possible bronchoscopy. Patient is not a candidate for percutaneous access. This was communicated directly to the Team.
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
## 2021-05-03 NOTE — ED Notes (Signed)
Pt well informed by MD and RN about need for blood. Pt understands and refuses blood products.

## 2021-05-03 NOTE — Consult Note (Addendum)
Consultation Note Date: 05/03/2021   Patient Name: Robin Estrada  DOB: 07-May-1946  MRN: [id number]  Age / Sex: 75 y.o., female  PCP: Plotnikov, Evie Lacks, MD Referring Physician: Norval Morton, MD  Reason for Consultation: Establishing goals of care  HPI/Patient Profile: 75 y.o. 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 In the ED, hemoglobin was 6.9 and stool guaiac noted to be positive. Imaging showed right upper lobe mass concerning for primary pulmonary neoplasm as well as diffuse metastatic osseous disease with T11 pathologic compression fracture. She declined blood products in the ED. Admitted to [facility].   Clinical Assessment and Goals of Care: I have reviewed medical records including EPIC notes, labs and imaging, examined the patient and met at bedside with patient  to discuss diagnosis, prognosis, GOC, EOL wishes, disposition, and options. Interpreter services utilized via video chat/I-pad [employer] name [place], [phone]).  I introduced Palliative Medicine as specialized medical care for people living with serious illness. It focuses on providing relief from the symptoms and stress of a serious illness.   We discussed a brief life review of the patient. 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 She is divorced. She has one daughter who lives in [place] and one son who lives in [place]. She immigrated to the [place] in 2007 to help her son/Slava. He owned a Government social research officer and had small children at the time. Patient has continued to live with her son and his family. As far as functional status, there has been a  decline over the past months but prior to that she was completely independent.  We discussed patient's current illness and what it means in the larger context of her ongoing co-morbidities. I provided education on the natural disease trajectory of advanced cancer, emphasizing that functional status is generally preserved until late in the disease course, followed by a precipitous decline over weeks to months. Discussed that the onset of decline usually suggests worsening disease. Patient tells me she understands her prognosis is terminal, stating "I'm finished".   Discussed that biopsy is needed to determine treatment options. Discussed that percutaneous biopsy is not an option, so plan is for bronchoscopy next Tuesday 12/27. Regarding treatment options, patient tells me she would consider radiation therapy as she has done this before. She tells me she would not want chemotherapy. She shares that her sister was on chemotherapy for several years before she died and told her she regretted it. She feels chemotherapy is "not living" and would make her feel worse.   I attempted to elicit values and goals of care important to the patient.  Quality of life is very important to her. The difference between aggressive medical intervention and comfort care was considered in light of the patient's goals of care.   We  did discuss code status. Encouraged patient to consider DNR/DNI status understanding evidenced based poor outcomes in similar hospitalized patients, as the cause of the arrest is likely associated with chronic/terminal disease rather than a reversible acute cardio-pulmonary event. She seems to indicate that she would agree that DNR is appropriate however she is not yet ready to make that decision.  Questions and concerns were addressed.  The family was encouraged to call with questions or concerns.    Primary decision maker: [name] She state he could make medical  decision for her if needed.     SUMMARY OF RECOMMENDATIONS   Continue current care Biopsy/bronch next Tuesday 12/27 Patient indicates she would do radiation, but is not interested in chemotherapy PMT will continue to follow  Code Status/Advance Care Planning: Full code  Symptom Management:  Vicodin 5-325 every 6 hours as needed for moderate pain Morphine 2 mg every 4 hours as needed for severe pain  Additional Recommendations (Limitations, Scope, Preferences): No Blood Transfusions  Prognosis:  Unable to determine  Discharge Planning: To Be Determined      Primary Diagnoses: Present on Admission:  GI bleed  Acute blood loss anemia  Hypertensive urgency  Leukocytosis  Hypokalemia  Thrombocytosis  Hypoalbuminemia  Pathologic compression fracture of thoracic vertebra (HCC)  Lung mass  Cystic mass of pancreas   I have reviewed the medical record, interviewed the patient and family, and examined the patient. The following aspects are pertinent.  Past Medical History:  Diagnosis Date   Breast mass, left    Large   Cancer (HCC)    FIBROMYOMA  AGE 36-   BENIGN  RESOLVED   Headache    Heart murmur    Hypertension    Mitral valve prolapse    Tuberculosis    AS CHILD  W/ TX     Allergies  Allergen Reactions   Hydrocodone-Acetaminophen Nausea Only   Statins Nausea And Vomiting and Other (See Comments)    MUSCLE PAIN   Amlodipine     fatigue   Clonidine Derivatives     headache   Losartan     abd pain   Sulfa Antibiotics Nausea Only   Sulfamethoxazole-Trimethoprim Nausea Only    " Severe Nausea "   Review of Systems  Constitutional:  Positive for fatigue.  Musculoskeletal:  Positive for back pain.   Physical Exam Constitutional:      General: She is not in acute distress.    Appearance: She is ill-appearing.  Pulmonary:     Effort: Pulmonary effort is normal.  Neurological:     Mental Status: She is alert and oriented to person, place, and time.      Motor: Weakness present.    Vital Signs: BP (!) 156/72    Pulse 68    Temp 97.7 F (36.5 C) (Oral)    Resp (!) 26    Ht 5' 0.63" (1.54 m)    Wt 67.1 kg    SpO2 94%    BMI 28.31 kg/m  Pain Scale: 0-10   Pain Score: 4    SpO2: SpO2: 94 % O2 Device:SpO2: 94 % O2 Flow Rate: .O2 Flow Rate (L/min): 0 L/min   Palliative Assessment/Data: PPS 30-40%     Time In: 1530 Time Out: 1642 Time Total: 72 minutes Greater than 50%  of this time was spent counseling and coordinating care related to the above assessment and plan.  Signed by: Lavena Bullion, NP  Please contact Palliative Medicine Team phone at [phone] for questions and concerns.  For individual provider: See Shea Evans

## 2021-05-04 ENCOUNTER — Encounter (HOSPITAL_COMMUNITY): Payer: Self-pay | Admitting: Internal Medicine

## 2021-05-04 ENCOUNTER — Inpatient Hospital Stay (HOSPITAL_COMMUNITY): Payer: Medicare HMO | Admitting: Anesthesiology

## 2021-05-04 ENCOUNTER — Encounter (HOSPITAL_COMMUNITY)
Admission: EM | Disposition: A | Discharge status: Home / Self Care | Payer: Self-pay | Source: Home / Self Care | Attending: Family Medicine

## 2021-05-04 DIAGNOSIS — I5033 Acute on chronic diastolic (congestive) heart failure: Secondary | ICD-10-CM | POA: Diagnosis not present

## 2021-05-04 DIAGNOSIS — D62 Acute posthemorrhagic anemia: Secondary | ICD-10-CM | POA: Diagnosis not present

## 2021-05-04 DIAGNOSIS — Z20822 Contact with and (suspected) exposure to covid-19: Secondary | ICD-10-CM | POA: Diagnosis not present

## 2021-05-04 DIAGNOSIS — K259 Gastric ulcer, unspecified as acute or chronic, without hemorrhage or perforation: Secondary | ICD-10-CM

## 2021-05-04 DIAGNOSIS — K2970 Gastritis, unspecified, without bleeding: Secondary | ICD-10-CM

## 2021-05-04 DIAGNOSIS — K862 Cyst of pancreas: Secondary | ICD-10-CM | POA: Diagnosis not present

## 2021-05-04 DIAGNOSIS — Z515 Encounter for palliative care: Secondary | ICD-10-CM | POA: Diagnosis not present

## 2021-05-04 DIAGNOSIS — I10 Essential (primary) hypertension: Secondary | ICD-10-CM

## 2021-05-04 DIAGNOSIS — C7951 Secondary malignant neoplasm of bone: Secondary | ICD-10-CM | POA: Diagnosis not present

## 2021-05-04 DIAGNOSIS — K922 Gastrointestinal hemorrhage, unspecified: Secondary | ICD-10-CM | POA: Diagnosis not present

## 2021-05-04 HISTORY — PX: ESOPHAGOGASTRODUODENOSCOPY: SHX5428

## 2021-05-04 HISTORY — PX: BIOPSY: SHX5522

## 2021-05-04 LAB — CBC
HCT: 22.6 % — ABNORMAL LOW (ref 36.0–46.0)
Hemoglobin: 6.7 g/dL — CL (ref 12.0–15.0)
MCH: 21.4 pg — ABNORMAL LOW (ref 26.0–34.0)
MCHC: 29.6 g/dL — ABNORMAL LOW (ref 30.0–36.0)
MCV: 72.2 fL — ABNORMAL LOW (ref 80.0–100.0)
Platelets: 536 10*3/uL — ABNORMAL HIGH (ref 150–400)
RBC: 3.13 MIL/uL — ABNORMAL LOW (ref 3.87–5.11)
RDW: 20.1 % — ABNORMAL HIGH (ref 11.5–15.5)
WBC: 11 10*3/uL — ABNORMAL HIGH (ref 4.0–10.5)
nRBC: 0 % (ref 0.0–0.2)

## 2021-05-04 LAB — BASIC METABOLIC PANEL
Anion gap: 10 (ref 5–15)
BUN: 7 mg/dL — ABNORMAL LOW (ref 8–23)
CO2: 24 mmol/L (ref 22–32)
Calcium: 8.4 mg/dL — ABNORMAL LOW (ref 8.9–10.3)
Chloride: 98 mmol/L (ref 98–111)
Creatinine, Ser: 0.7 mg/dL (ref 0.44–1.00)
GFR, Estimated: >60 mL/min (ref >60)
Glucose, Bld: 120 mg/dL — ABNORMAL HIGH (ref 70–99)
Potassium: 3.4 mmol/L — ABNORMAL LOW (ref 3.5–5.1)
Sodium: 132 mmol/L — ABNORMAL LOW (ref 135–145)

## 2021-05-04 LAB — URINE CULTURE: Culture: <10000 — AB

## 2021-05-04 LAB — HEMOGLOBIN AND HEMATOCRIT, BLOOD
HCT: 23.1 % — ABNORMAL LOW (ref 36.0–46.0)
Hemoglobin: 6.9 g/dL — CL (ref 12.0–15.0)

## 2021-05-04 SURGERY — EGD (ESOPHAGOGASTRODUODENOSCOPY)
Anesthesia: Monitor Anesthesia Care

## 2021-05-04 MED ORDER — PROPOFOL 500 MG/50ML IV EMUL
INTRAVENOUS | Status: DC | PRN
  Administered 2021-05-04: 100 ug/kg/min via INTRAVENOUS

## 2021-05-04 MED ORDER — LIDOCAINE HCL (CARDIAC) PF 100 MG/5ML IV SOSY
PREFILLED_SYRINGE | INTRAVENOUS | Status: DC | PRN
  Administered 2021-05-04: 40 mg via INTRAVENOUS

## 2021-05-04 MED ORDER — SODIUM CHLORIDE 0.9 % IV SOLN
125.0000 mg | Freq: Once | INTRAVENOUS | Status: AC
  Administered 2021-05-04: 14:00:00 125 mg via INTRAVENOUS
  Filled 2021-05-04: qty 10

## 2021-05-04 MED ORDER — ADULT MULTIVITAMIN W/MINERALS CH
1.0000 | ORAL_TABLET | Freq: Every day | ORAL | Status: DC
Start: 2021-05-04 — End: 2021-05-26
  Administered 2021-05-05 – 2021-05-26 (×18): 1 via ORAL
  Filled 2021-05-04 (×20): qty 1

## 2021-05-04 MED ORDER — PEG-KCL-NACL-NASULF-NA ASC-C 100 G PO SOLR: 0.5000 | Freq: Once | ORAL | Status: DC

## 2021-05-04 MED ORDER — PEG-KCL-NACL-NASULF-NA ASC-C 100 G PO SOLR: 1.0000 | Freq: Once | ORAL | Status: DC

## 2021-05-04 MED ORDER — POLYETHYLENE GLYCOL 3350 17 G PO PACK
17.0000 g | PACK | Freq: Once | ORAL | Status: AC
  Administered 2021-05-04: 22:00:00 17 g via ORAL
  Filled 2021-05-04: qty 1

## 2021-05-04 MED ORDER — PEG-KCL-NACL-NASULF-NA ASC-C 100 G PO SOLR
0.5000 | Freq: Once | ORAL | Status: DC
  Filled 2021-05-04: qty 1

## 2021-05-04 MED ORDER — PROPOFOL 10 MG/ML IV BOLUS
INTRAVENOUS | Status: DC | PRN
  Administered 2021-05-04: 30 mg via INTRAVENOUS

## 2021-05-04 MED ORDER — ENSURE ENLIVE PO LIQD
237.0000 mL | Freq: Two times a day (BID) | ORAL | Status: DC
  Administered 2021-05-05 – 2021-05-13 (×8): 237 mL via ORAL

## 2021-05-04 MED ORDER — PROSOURCE PLUS PO LIQD
30.0000 mL | Freq: Two times a day (BID) | ORAL | Status: DC
  Administered 2021-05-05 – 2021-05-13 (×11): 30 mL via ORAL
  Filled 2021-05-04 (×13): qty 30

## 2021-05-04 MED ORDER — SODIUM CHLORIDE 0.9 % IV SOLN
50.0000 mg | Freq: Once | INTRAVENOUS | Status: DC
  Filled 2021-05-04: qty 1

## 2021-05-04 MED ORDER — PANTOPRAZOLE SODIUM 40 MG PO TBEC
40.0000 mg | DELAYED_RELEASE_TABLET | Freq: Two times a day (BID) | ORAL | Status: DC
  Administered 2021-05-05 – 2021-05-26 (×40): 40 mg via ORAL
  Filled 2021-05-04 (×43): qty 1

## 2021-05-04 MED ORDER — BISACODYL 5 MG PO TBEC
10.0000 mg | DELAYED_RELEASE_TABLET | Freq: Once | ORAL | Status: AC
  Administered 2021-05-04: 22:00:00 10 mg via ORAL
  Filled 2021-05-04: qty 2

## 2021-05-04 MED ORDER — LACTATED RINGERS IV SOLN: INTRAVENOUS | Status: DC | PRN

## 2021-05-04 NOTE — Anesthesia Procedure Notes (Signed)
Procedure Name: MAC Date/Time: 05/04/2021 10:03 AM Performed by: Leonor Liv, CRNA Pre-anesthesia Checklist: Patient identified, Emergency Drugs available, Suction available, Patient being monitored and Timeout performed Patient Re-evaluated:Patient Re-evaluated prior to induction Oxygen Delivery Method: Circle system utilized and Nasal cannula Placement Confirmation: positive ETCO2 Dental Injury: Teeth and Oropharynx as per pre-operative assessment

## 2021-05-04 NOTE — Assessment & Plan Note (Addendum)
Blood pressure initially elevated secondary to volume overload.  BNP of 404, status post IV Lasix.  Continue metoprolol.  Also on as needed clonidine and once a week Maxide.  Since then blood pressure has been much more stable.

## 2021-05-04 NOTE — Assessment & Plan Note (Addendum)
Patient initially was refusing blood transfusion, only excepting IV iron.  After discussion with her son, she changed her mind and received 1 unit packed red blood cells on the night of 12/26.  Hemoglobin trending downward.  Recheck labs in the morning.  Transfuse for hemoglobin below 7.

## 2021-05-04 NOTE — Evaluation (Signed)
Physical Therapy Evaluation Patient Details Name: Robin Estrada MRN: [id number] DOB: December 24, 1945 Today's Date: 05/04/2021  History of Present Illness  75 y.o. female presents to [facility] hospital on 05/02/2021 with complaints of weakness, DOE. 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  Clinical Impression  Pt presents to PT with deficits in activity tolerance, power, gait, endurance. Pt is able to mobilize within the room without physical assistance, declining mobility out of the room. Pt demonstrates reduced endurance, fatiguing rather quickly compared to baseline. PT encourages frequent ambulation from the patient, to aide in improving activity tolerance. PT will continue to follow in order to challenge dynamic gait and balance further.       Recommendations for follow up therapy are one component of a multi-disciplinary discharge planning process, led by the attending physician.  Recommendations may be updated based on patient status, additional functional criteria and insurance authorization.  Follow Up Recommendations Outpatient PT    Assistance Recommended at Discharge None  Functional Status Assessment Patient has had a recent decline in their functional status and demonstrates the ability to make significant improvements in function in a reasonable and predictable amount of time.  Equipment Recommendations  None recommended by PT    Recommendations for Other Services       Precautions / Restrictions Precautions Precautions: Fall Precaution Comments: anemic Restrictions Weight Bearing Restrictions: No      Mobility  Bed Mobility Overal bed mobility: Modified Independent                  Transfers Overall transfer level: Independent                       Ambulation/Gait Ambulation/Gait assistance: Supervision Gait Distance (Feet): 100 Feet Assistive device: None Gait Pattern/deviations: WFL(Within Functional Limits) Gait velocity: functional Gait velocity interpretation: 1.31 - 2.62 ft/sec, indicative of limited community ambulator   General Gait Details: pt ambulates 2 laps around room, reporting headache and fatigue near end of walker  Stairs            Wheelchair Mobility    Modified Rankin (Stroke Patients Only)       Balance Overall balance assessment: Mild deficits observed, not formally tested                                           Pertinent Vitals/Pain Pain Assessment: 0-10 Pain Score: 3  Pain Location: low back Pain Descriptors / Indicators: Aching Pain Intervention(s): Monitored during session    Home Living Family/patient expects to be discharged to:: Private residence Living Arrangements: Children;Other relatives Available Help at Discharge: Family Type of Home: House Home Access: Stairs to enter Entrance Stairs-Rails: None Entrance Stairs-Number of Steps: 2 Alternate Level Stairs-Number of Steps: 15 Home Layout: Two level Home Equipment: None      Prior Function Prior Level of Function : Independent/Modified Independent                     Hand Dominance        Extremity/Trunk Assessment   Upper Extremity Assessment Upper Extremity Assessment: Overall WFL for tasks assessed    Lower Extremity Assessment Lower Extremity Assessment:  Overall [facility] for tasks assessed    Cervical / Trunk Assessment Cervical / Trunk Assessment: Normal  Communication   Communication: Prefers language other than English ([place] video interpreter utilized)  Cognition Arousal/Alertness: Awake/alert Behavior During Therapy: WFL for tasks assessed/performed Overall Cognitive Status: Within Functional Limits for tasks assessed                                           General Comments General comments (skin integrity, edema, etc.): VSS on RA    Exercises     Assessment/Plan    PT Assessment Patient needs continued PT services  PT Problem List Decreased strength;Decreased activity tolerance;Decreased balance;Decreased mobility;Cardiopulmonary status limiting activity       PT Treatment Interventions DME instruction;Gait training;Stair training;Functional mobility training;Therapeutic activities;Therapeutic exercise;Balance training;Neuromuscular re-education;Patient/family education    PT Goals (Current goals can be found in the Care Plan section)  Acute Rehab PT Goals Patient Stated Goal: patient goal seems to be to discharge to assisted living, or return to her prior level of function PT Goal Formulation: With patient Time For Goal Achievement: 05/18/21 Potential to Achieve Goals: Fair Additional Goals Additional Goal #1: Pt will score >19/24 on DGI to indicate a reduced risk for falls    Frequency Min 3X/week   Barriers to discharge        Co-evaluation               AM-PAC PT "6 Clicks" Mobility  Outcome Measure Help needed turning from your back to your side while in a flat bed without using bedrails?: None Help needed moving from lying on your back to sitting on the side of a flat bed without using bedrails?: None Help needed moving to and from a bed to a chair (including a wheelchair)?: None Help needed standing up from a chair using your arms (e.g., wheelchair or bedside chair)?: None Help needed to walk in hospital room?: A Little Help needed climbing 3-5 steps with a railing? : A Little 6 Click Score: 22    End of Session   Activity Tolerance: Patient tolerated treatment well Patient left: in bed;with call bell/phone within reach Nurse Communication: Mobility status PT Visit Diagnosis: Other abnormalities of gait and mobility (R26.89);Muscle weakness (generalized) (M62.81)    Time:  5686-1683 PT Time Calculation (min) (ACUTE ONLY): 43 min   Charges:   PT Evaluation $PT Eval Moderate Complexity: 1 Mod          Zenaida Niece, PT, DPT Acute Rehabilitation Pager: [phone] Office [phone]   Zenaida Niece 05/04/2021, 4:04 PM

## 2021-05-04 NOTE — Anesthesia Postprocedure Evaluation (Signed)
Anesthesia Post Note  Patient: Robin Estrada  Procedure(s) Performed: ESOPHAGOGASTRODUODENOSCOPY (EGD) BIOPSY     Patient location during evaluation: PACU Anesthesia Type: MAC Level of consciousness: awake and alert Pain management: pain level controlled Vital Signs Assessment: post-procedure vital signs reviewed and stable Respiratory status: spontaneous breathing, nonlabored ventilation, respiratory function stable and patient connected to nasal cannula oxygen Cardiovascular status: stable and blood pressure returned to baseline Postop Assessment: no apparent nausea or vomiting Anesthetic complications: no   No notable events documented.  Last Vitals:  Vitals:   05/04/21 0945 05/04/21 1025  BP: (!) 192/52 139/61  Pulse: 67 64  Resp: 20 (!) 25  Temp: 36.7 C 36.8 C  SpO2: 95% 98%    Last Pain:  Vitals:   05/04/21 0945  TempSrc: Oral  PainSc: 0-No pain                 Trew Sunde S

## 2021-05-04 NOTE — Interval H&P Note (Signed)
History and Physical Interval Note:  05/04/2021 10:10 AM  Robin Estrada  has presented today for surgery, with the diagnosis of Microcytic iron deficient anemia, nausea, vomiting, early satiety.  The various methods of treatment have been discussed with the patient and family. After consideration of risks, benefits and other options for treatment, the patient has consented to  Procedure(s): ESOPHAGOGASTRODUODENOSCOPY (EGD) (N/A) as a surgical intervention.  The patient's history has been reviewed, patient examined, no change in status, stable for surgery.  I have reviewed the patient's chart and labs.  Questions were answered to the patient's satisfaction.    Lengthy discussion with the patient using interpreter services.  The risks and benefits of endoscopic evaluation were discussed with the patient; these include but are not limited to the risk of perforation, infection, bleeding, missed lesions, lack of diagnosis, severe illness requiring hospitalization, as well as anesthesia and sedation related illnesses.  The patient and/or family is agreeable to proceed.    [employer]

## 2021-05-04 NOTE — Progress Notes (Signed)
Initial Nutrition Assessment  DOCUMENTATION CODES:   Not applicable  INTERVENTION:  -Ensure Enlive po BID, each supplement provides 350 kcal and 20 grams of protein -PROSource PLUS PO 51ms BID, each supplement provides 100 kcals and 15 grams of protein -MVI with minerals daily  NUTRITION DIAGNOSIS:   Inadequate oral intake related to poor appetite as evidenced by per patient/family report.  GOAL:   Patient will meet greater than or equal to 90% of their needs  MONITOR:   PO intake, Supplement acceptance, Diet advancement, Weight trends, Labs, I & O's  REASON FOR ASSESSMENT:   Malnutrition Screening Tool    ASSESSMENT:   75y.o. 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 In the ED, hemoglobin was 6.9 and stool guaiac noted to be positive. Imaging showed right upper lobe mass concerning for primary pulmonary neoplasm as well as diffuse metastatic osseous disease with T11 pathologic compression fracture. She declined blood products in the ED. [facility]  RD working remotely and unable to reach pt via phone. Pt undergoing EGD. Will attempt to obtain diet/wt hx at follow-up. CCM was consulted for evaluation of lung mass. Pt to have biopsy/bronch Tuesday 12/27. Note PMT following and pt indicated she would do radiation but no chemo.   Weight history reviewed. Pt weight 77.6 kg on 11/29/20 and 67/1 kg today. This indicates a clinically significant 13.5% wt loss x5 months. Suspect pt meets criteria for malnutrition but unable to diagnose without diet hx and/or NFPE.  UOP 1x unmeasured occurrence x24 hours I/O: +9339msince admit  Medications: Scheduled Meds:  atenolol  100 mg Oral BID   bisacodyl  10 mg Oral Once   pantoprazole  40 mg  Oral BID AC   [START ON 05/05/2021] peg 3350 powder  0.5 kit Oral Once   And   [START ON 05/05/2021] peg 3350 powder  0.5 kit Oral Once   polyethylene glycol  17 g Oral Once   sodium chloride flush  3 mL Intravenous Q12H   Labs: Recent Labs  Lab 05/02/21 2343 05/04/21 0629  NA 128* 132*  K 3.4* 3.4*  CL 94* 98  CO2 22 24  BUN 7* 7*  CREATININE 0.62 0.70  CALCIUM 8.9 8.4*  MG 1.9  --   GLUCOSE 151* 120*  Hgb: 6.7 (L)   NUTRITION - FOCUSED PHYSICAL EXAM: Unable to perform at this time as RD is working remotely. Will attempt at follow-up.   Diet Order:   Diet Order             Diet clear liquid Room service appropriate? Yes; Fluid consistency: Thin  Diet effective now           DIET SOFT Room service appropriate? Yes; Fluid consistency: Thin  Diet effective now                   EDUCATION NEEDS:   No education needs have been identified at this time  Skin:  Skin Assessment: Reviewed RN Assessment  Last BM:  12/22  Height:   Ht Readings from Last 1 Encounters:  05/04/21 5' 5" (1.651 m)    Weight:   Wt Readings from Last 1 Encounters:  05/04/21 67.1 kg    BMI:  Body mass index is 24.62 kg/m.  Estimated Nutritional Needs:  Kcal:  1700-1900 ° °Protein:  85-95 grams ° °Fluid:  >1.7L ° ° ° ° ° A., MS, RD, LDN (she/her/hers) °RD pager number and weekend/on-call pager number located in Amion. ° °

## 2021-05-04 NOTE — Anesthesia Preprocedure Evaluation (Signed)
Anesthesia Evaluation  Patient identified by MRN, date of birth, ID band Patient awake    Reviewed: Allergy & Precautions, NPO status , Patient's Chart, lab work & pertinent test results  Airway Mallampati: II  TM Distance: >3 FB Neck ROM: Full    Dental no notable dental hx.    Pulmonary neg pulmonary ROS,    Pulmonary exam normal breath sounds clear to auscultation       Cardiovascular hypertension, Pt. on home beta blockers and Pt. on medications Normal cardiovascular exam Rhythm:Regular Rate:Normal     Neuro/Psych negative neurological ROS  negative psych ROS   GI/Hepatic negative GI ROS, Neg liver ROS,   Endo/Other  negative endocrine ROS  Renal/GU negative Renal ROS  negative genitourinary   Musculoskeletal negative musculoskeletal ROS (+)   Abdominal   Peds negative pediatric ROS (+)  Hematology  (+) anemia ,   Anesthesia Other Findings   Reproductive/Obstetrics negative OB ROS                             Anesthesia Physical Anesthesia Plan  ASA: 3  Anesthesia Plan: MAC   Post-op Pain Management:    Induction: Intravenous  PONV Risk Score and Plan: 2 and Propofol infusion and Treatment may vary due to age or medical condition  Airway Management Planned: Simple Face Mask  Additional Equipment:   Intra-op Plan:   Post-operative Plan:   Informed Consent: I have reviewed the patients History and Physical, chart, labs and discussed the procedure including the risks, benefits and alternatives for the proposed anesthesia with the patient or authorized representative who has indicated his/her understanding and acceptance.     Dental advisory given  Plan Discussed with: CRNA and Surgeon  Anesthesia Plan Comments:         Anesthesia Quick Evaluation

## 2021-05-04 NOTE — Assessment & Plan Note (Addendum)
Unclear etiology.  Findings on EGD not proportionate for blood loss.  Colonoscopy done removing several polyps and noted diverticula.  No masses or active bleeding noted.

## 2021-05-04 NOTE — Assessment & Plan Note (Addendum)
CA 19-9 level and lipase levels normal.

## 2021-05-04 NOTE — Assessment & Plan Note (Addendum)
Possible primary versus metastatic disease.  Status post bronchoscopy with multiple areas biopsied.  Pathology returned unremarkable for the sites.  Large hilar mass unable to be reached by bronchoscopy and patient needs interventional radiology ultrasound-guided biopsy.  She is considering this.

## 2021-05-04 NOTE — Assessment & Plan Note (Signed)
Replace as needed

## 2021-05-04 NOTE — Assessment & Plan Note (Addendum)
Suspect stress margination.  No antibiotics and white blood cell count has normalized as of 12/30 although trended back up on 1/1 which I suspect is from CHF.Robin Estrada

## 2021-05-04 NOTE — Progress Notes (Signed)
OT Cancellation Note  Patient Details Name: Robin Estrada MRN: [id number] DOB: 05/13/1945   Cancelled Treatment:    Reason Eval/Treat Not Completed: Patient at procedure or test/ unavailable. Pt off floor for EGD at this time, OT will follow up as time allows to complete Evaluation.   [name], OTR/L Acute Rehabilitation  [name] [name] 05/04/2021, 9:50 AM

## 2021-05-04 NOTE — Transfer of Care (Signed)
Immediate Anesthesia Transfer of Care Note  Patient: Robin Estrada  Procedure(s) Performed: ESOPHAGOGASTRODUODENOSCOPY (EGD) BIOPSY  Patient Location: PACU  Anesthesia Type:MAC  Level of Consciousness: drowsy  Airway & Oxygen Therapy: Patient Spontanous Breathing and Patient connected to nasal cannula oxygen  Post-op Assessment: Report given to RN and Post -op Vital signs reviewed and stable  Post vital signs: Reviewed and stable  Last Vitals:  Vitals Value Taken Time  BP 139/61 05/04/21 1024  Temp    Pulse 60 05/04/21 1026  Resp 18 05/04/21 1026  SpO2 99 % 05/04/21 1026  Vitals shown include unvalidated device data.  Last Pain:  Vitals:   05/04/21 0945  TempSrc: Oral  PainSc: 0-No pain         Complications: No notable events documented.

## 2021-05-04 NOTE — Progress Notes (Signed)
Patient refused bed alarm, socks and fall wrist band. Falls education done.

## 2021-05-04 NOTE — Assessment & Plan Note (Addendum)
Decreased p.o. intake likely in the setting of malignancy.  Appreciate nutrition help.  Liberalize diet.  Multivitamin, Ensure and Prosource added

## 2021-05-04 NOTE — Op Note (Signed)
[facility] Patient Name: Robin Estrada Procedure Date : 05/04/2021 MRN: [id number] Attending MD: Justice Britain , MD Date of Birth: Jul 13, 1945 CSN: [id number] Age: 75 Admit Type: Inpatient Procedure:                Upper GI endoscopy Indications:              Iron deficiency anemia, Nausea with vomiting Providers:                Justice Britain, MD, Carmie End, RN,                            Benetta Spar, Technician, Trixie Deis, CRNA Referring MD:             [facility], Kenney Plotnikov MD, MD Medicines:                Monitored Anesthesia Care Complications:            No immediate complications. Estimated Blood Loss:     Estimated blood loss was minimal. Procedure:                Pre-Anesthesia Assessment:                           - Prior to the procedure, a History and Physical                            was performed, and patient medications and                            allergies were reviewed. The patient's tolerance of                            previous anesthesia was also reviewed. The risks                            and benefits of the procedure and the sedation                            options and risks were discussed with the patient.                            All questions were answered, and informed consent                            was obtained. Prior Anticoagulants: The patient has                            taken no previous anticoagulant or antiplatelet                            agents except for aspirin. ASA Grade Assessment:                            III - A patient with severe systemic disease. After  reviewing the risks and benefits, the patient was                            deemed in satisfactory condition to undergo the                            procedure.                           After obtaining informed consent, the endoscope was                            passed under direct  vision. Throughout the                            procedure, the patient's blood pressure, pulse, and                            oxygen saturations were monitored continuously. The                            [id number]-H190 ([id number]) Olympus endoscope was introduced                            through the mouth, and advanced to the second part                            of duodenum. The upper GI endoscopy was                            accomplished without difficulty. The patient                            tolerated the procedure. Scope In: Scope Out: Findings:      No gross lesions were noted in the entire esophagus.      A non-obstructing Schatzki ring was found at the gastroesophageal       junction.      The Z-line was regular and was found 34 cm from the incisors.      A 4 cm hiatal hernia was present.      One non-bleeding superficial gastric ulcer with a clean ulcer base       (Forrest Class III) was found in the prepyloric region of the stomach.       The lesion was 9 mm in largest dimension.      Localized moderate inflammation characterized by erosions and friability       was found in the gastric body and in the gastric antrum.      No gross lesions were noted in the entire examined stomach. Biopsies       were taken with a cold forceps for histology and Helicobacter pylori       testing.      No gross lesions were noted in the duodenal bulb, in the first portion       of the duodenum and in the second portion of the duodenum. Biopsies for       histology were  taken with a cold forceps for evaluation of celiac       disease. Impression:               - No gross lesions in esophagus. Schatzki ring                            noted. Z-line regular, 34 cm from the incisors.                           - 4 cm hiatal hernia.                           - Non-bleeding gastric ulcer with a clean ulcer                            base (Forrest Class III) in the prepylorus.                             Gastritis and gastric erosions in                            body/antrum/prepylorus. No other gross lesions in                            the stomach - biopsied for HP evaluation.                           - No gross lesions in the duodenal bulb, in the                            first portion of the duodenum and in the second                            portion of the duodenum. Biopsied. Recommendation:           - The patient will be observed post-procedure,                            until all discharge criteria are met.                           - Return patient to hospital ward for ongoing care.                           - Patient has a contact number available for                            emergencies. The signs and symptoms of potential                            delayed complications were discussed with the                            patient. Return to normal activities tomorrow.  Written discharge instructions were provided to the                            patient.                           - Resume previous diet.                           - PPI BID should be continued but can be PO.                           - Minimize NSAIDs as able.                           - If patient is amenable, then can consider                            Colonoscopy though she will need to be prepped for                            this and with her back pain she is not sure if she                            would be able to tolerate this.                           - Any attempt at colonoscopy would have to be in                            the hospital-based setting if she has a Hgb<8 (we                            cannot pursue these patients in the [facility] outpatient                            center).                           - Strongly recommend IV Iron infusion (she is                            deferring on blood products). Oral Iron would be                             reasonable, but would not start as inpatient until                            known whether we will pursue colonoscopy or not.                           - If she defers on colonoscopy for now then GI will  signoff.                           - The findings and recommendations were discussed                            with the patient.                           - The findings and recommendations were discussed                            with the referring physician. Procedure Code(s):        --- Professional ---                           [phone], Esophagogastroduodenoscopy, flexible,                            transoral; with biopsy, single or multiple Diagnosis Code(s):        --- Professional ---                           K44.9, Diaphragmatic hernia without obstruction or                            gangrene                           K25.9, Gastric ulcer, unspecified as acute or                            chronic, without hemorrhage or perforation                           K29.70, Gastritis, unspecified, without bleeding                           D50.9, Iron deficiency anemia, unspecified                           R11.2, Nausea with vomiting, unspecified CPT copyright 2019 American Medical Association. All rights reserved. The codes documented in this report are preliminary and upon coder review may  be revised to meet current compliance requirements. Justice Britain, MD 05/04/2021 10:28:22 AM Number of Addenda: 0

## 2021-05-04 NOTE — Assessment & Plan Note (Signed)
Likely reactive

## 2021-05-04 NOTE — Progress Notes (Signed)
[facility] Progress Note  Patient: Robin Estrada    BCW:[id number]  DOA: 05/02/2021    Date of Service: the patient was seen and examined on 05/04/2021  Brief hospital course: 75 year old [place] female with history of breast cancer and hypertension admitted on 12/22 with complaints of anorexia and weight loss x3 months and found to have hemoglobin at 6.9 with Hemoccult positive.  Patient refused blood transfusion.  CT of abdomen pelvis with contrast notes a cystic lesion of tail of pancreas with infiltrative disease of the liver and chest x-ray notes right upper lobe mass.  Admitted to hospital service.  GI consulted and patient underwent EGD on 12/24, noting gastritis and ulcer, not consistent with level of hemoglobin.  Palliative care consulted and patient is understanding of her overall condition and likely diagnosis and prognosis.  She is considering being a DNR, but is not ready to make that decision at this time.  GI offered colonoscopy which patient is amenable to and will be done on Monday, 12/26.  Critical care also consulted and have plans for bronchoscopy on Tuesday, 12/27.  Plan for today post EGD is for IV iron, which patient is amenable to.  Assessment and Plan: Cardiovascular and Mediastinum Essential hypertension Assessment & Plan Blood pressure slightly elevated.  Continue metoprolol.  Also on as needed clonidine and once a week Maxide  Digestive Cystic mass of pancreas Assessment & Plan Possible primary.  Have ordered CA 19-9  * GI bleed Assessment & Plan Unclear etiology.  Findings on EGD not proportionate for blood loss.  Colonoscopy on Monday.  Musculoskeletal and Integument Pathologic compression fracture of thoracic vertebra [facility]) Assessment & Plan As needed hydrocodone.  PT recommends outpatient PT  Hematopoietic and Hemostatic Thrombocytosis Assessment & Plan Likely reactive  Other Lung mass Assessment & Plan Possible primary versus  metastatic disease.  For bronchoscopy on Tuesday  Hypoalbuminemia Assessment & Plan Decreased p.o. intake likely in the setting of malignancy.  Nutrition to see  Hypokalemia Assessment & Plan Replace as needed  Leukocytosis Assessment & Plan Suspect stress margination.  No antibiotics and white blood cell count down to 11 today.  Acute blood loss anemia Assessment & Plan Patient refuses blood transfusion.  Will accept IV iron.  As above, colonoscopy on Monday    Body mass index is 24.62 kg/m.  Nutrition Problem: Inadequate oral intake Etiology: poor appetite     Consultants: Gastroenterology Critical care/pulmonary Palliative care  Procedures: Planned colonoscopy 12/26 Plan bronchoscopy 12/27 Status post EGD  Antimicrobials: None  Code Status: Full code   Subjective: Patient seen post EGD.  No complaints although not much of an appetite  Objective: Vital signs stable except for systolic blood pressure elevated in the 160s Vitals:   05/04/21 1110 05/04/21 1552  BP: (!) 166/59 (!) 142/56  Pulse: 65 68  Resp: (!) 21 16  Temp: 98.2 F (36.8 C) 98.1 F (36.7 C)  SpO2: 96% 94%    Intake/Output Summary (Last 24 hours) at 05/04/2021 1746 Last data filed at 05/04/2021 1500 Gross per 24 hour  Intake 1796.25 ml  Output 2 ml  Net 1794.25 ml   Filed Weights   05/02/21 2325 05/04/21 0945  Weight: 67.1 kg 67.1 kg   Body mass index is 24.62 kg/m.  Exam:  General: Alert and oriented x3, no acute distress HEENT: Normocephalic atraumatic, mucous membranes are moist Cardiovascular: Regular rate and rhythm, S1-S2 Respiratory: Clear to auscultation bilaterally Abdomen: Soft, nontender, nondistended, positive bowel sounds Musculoskeletal: No clubbing or cyanosis  or edema Skin: No skin break, tears or lesions Psychiatry: Appropriate, no evidence of psychoses Neurology: No focal deficits  Data Reviewed: Hemoglobin remains low at 6.7, white count  improving  Disposition:  Status is: Inpatient  Remains inpatient appropriate because: Work-up ongoing with procedure scheduled for Monday and Tuesday    Family Communication: Left message for son  DVT Prophylaxis: SCDs Start: 05/03/21 8004    Author: Annita Brod ,MD 05/04/2021 5:46 PM  To reach On-call, see care teams to locate the attending and reach out via [email or web address]. Between 7PM-7AM, please contact night-coverage If you still have difficulty reaching the attending provider, please page the [facility] (Director on Call) for [facility] on amion for assistance.

## 2021-05-04 NOTE — Hospital Course (Addendum)
74 year old [place] female with history of breast cancer and hypertension admitted on 12/22 with complaints of anorexia and weight loss x3 months and found to have hemoglobin at 6.9 with Hemoccult positive.  CT of abdomen pelvis with contrast notes a cystic lesion of tail of pancreas with infiltrative disease of the liver and chest x-ray notes right upper lobe mass.  Admitted to hospital service.  GI consulted and patient underwent EGD on 12/24, noting gastritis and ulcer, not consistent with level of hemoglobin.  Palliative care consulted and patient is understanding of her overall condition and likely diagnosis and prognosis.  For now, she wishes to be a full code.  Initially refused blood transfusion, but after discussion with son, amenable.  Patient received 1 unit packed red blood cells on the night of 12/26.  Also has received IV iron.  Underwent bronchoscopy on the morning of 12/27 with multiple areas biopsied however pathology came back unremarkable in these areas.  The largest mass in the hilum unable to be biopsied.  Patient underwent colonoscopy on 12/29.  Several polyps removed, but no masses for active areas of bleeding noted.  Current plan is for ultrasound-guided biopsy by interventional radiology on Tuesday, 1/3.  Hospital course noted for some volume overload.

## 2021-05-04 NOTE — Progress Notes (Signed)
°   05/04/21 0741  Provider Notification  Provider Name/Title Sendil MD  Date Provider Notified 05/04/21  Time Provider Notified 0740  Notification Type Page  Notification Reason Critical result  Test performed and critical result Hgb 6.7  Date Critical Result Received 05/04/21  Time Critical Result Received 0731  Provider response Other (Comment) (MD aware. pt refused blood transfusion yesterday. MD will talk to pt again.)  Date of Provider Response 05/04/21  Time of Provider Response [phone]

## 2021-05-04 NOTE — Assessment & Plan Note (Addendum)
Still causing some amount of pain.  As needed hydrocodone, IV morphine.  Started on Duragesic patch although she continues to have breakthrough pain.  PT recommends outpatient PT. if diagnosis of cancer confirmed, radiation therapy may be helpful

## 2021-05-05 DIAGNOSIS — I5032 Chronic diastolic (congestive) heart failure: Secondary | ICD-10-CM

## 2021-05-05 DIAGNOSIS — R918 Other nonspecific abnormal finding of lung field: Secondary | ICD-10-CM | POA: Diagnosis not present

## 2021-05-05 DIAGNOSIS — I5033 Acute on chronic diastolic (congestive) heart failure: Secondary | ICD-10-CM | POA: Clinically undetermined

## 2021-05-05 DIAGNOSIS — D62 Acute posthemorrhagic anemia: Secondary | ICD-10-CM | POA: Diagnosis not present

## 2021-05-05 DIAGNOSIS — I10 Essential (primary) hypertension: Secondary | ICD-10-CM | POA: Diagnosis not present

## 2021-05-05 LAB — BASIC METABOLIC PANEL
Anion gap: 9 (ref 5–15)
BUN: 10 mg/dL (ref 8–23)
CO2: 23 mmol/L (ref 22–32)
Calcium: 8.8 mg/dL — ABNORMAL LOW (ref 8.9–10.3)
Chloride: 102 mmol/L (ref 98–111)
Creatinine, Ser: 0.7 mg/dL (ref 0.44–1.00)
GFR, Estimated: >60 mL/min (ref >60)
Glucose, Bld: 138 mg/dL — ABNORMAL HIGH (ref 70–99)
Potassium: 3.7 mmol/L (ref 3.5–5.1)
Sodium: 134 mmol/L — ABNORMAL LOW (ref 135–145)

## 2021-05-05 LAB — CBC
HCT: 24.2 % — ABNORMAL LOW (ref 36.0–46.0)
Hemoglobin: 7 g/dL — ABNORMAL LOW (ref 12.0–15.0)
MCH: 21.2 pg — ABNORMAL LOW (ref 26.0–34.0)
MCHC: 28.9 g/dL — ABNORMAL LOW (ref 30.0–36.0)
MCV: 73.3 fL — ABNORMAL LOW (ref 80.0–100.0)
Platelets: 582 10*3/uL — ABNORMAL HIGH (ref 150–400)
RBC: 3.3 MIL/uL — ABNORMAL LOW (ref 3.87–5.11)
RDW: 20.5 % — ABNORMAL HIGH (ref 11.5–15.5)
WBC: 11.5 10*3/uL — ABNORMAL HIGH (ref 4.0–10.5)
nRBC: 0 % (ref 0.0–0.2)

## 2021-05-05 LAB — BRAIN NATRIURETIC PEPTIDE: B Natriuretic Peptide: 403.9 pg/mL — ABNORMAL HIGH (ref 0.0–100.0)

## 2021-05-05 MED ORDER — SODIUM CHLORIDE 0.9 % IV SOLN: INTRAVENOUS | Status: DC

## 2021-05-05 NOTE — Assessment & Plan Note (Addendum)
Noted on echocardiogram from 3 years ago.  Given hypertensive urgency issues, suspect that blood transfusion has led to increased volume and volume overload.  BNP at 404 and received IV Lasix.  Patient still noticing some expiratory wheezing and BNP today at 729.  Have increased Lasix.  Suspect mild leukocytosis due to stress margination from volume overload.

## 2021-05-05 NOTE — Progress Notes (Signed)
Brief GI progress note  Unfortunately I am being told by the hospital system that I cannot perform nonemergent procedures on 12/26.  As the patient is not having active bleeding her procedure is considered a nonurgent procedure.  The earliest a colonoscopy can be offered would be 12/27 now though it looks like the patient is also scheduled for bronchoscopy on 12/27.  I have updated the primary medical service and they will try to find out if there is a particular time for which the patient is scheduled though I suspect we will not know until Tuesday for that.  Her colonoscopy can be performed at any time point during her hospitalization.  Her bronchoscopy seems to be the most important with the pulmonary mass to be evaluated next.  I am canceling her preparation for tonight.   Justice Britain, MD [facility] Office # [phone]

## 2021-05-05 NOTE — Progress Notes (Signed)
[facility] Progress Note  Patient: Robin Estrada    ZLD:[id number]  DOA: 05/02/2021    Date of Service: the patient was seen and examined on 05/05/2021  Brief hospital course: 75 year old [name] female with history of breast cancer and hypertension admitted on 12/22 with complaints of anorexia and weight loss x3 months and found to have hemoglobin at 6.9 with Hemoccult positive.  CT of abdomen pelvis with contrast notes a cystic lesion of tail of pancreas with infiltrative disease of the liver and chest x-ray notes right upper lobe mass.  Admitted to hospital service.  GI consulted and patient underwent EGD on 12/24, noting gastritis and ulcer, not consistent with level of hemoglobin.  Palliative care consulted and patient is understanding of her overall condition and likely diagnosis and prognosis.  She is considering being a DNR, but is not ready to make that decision at this time.  She has refused a blood transfusion, but amenable to IV iron.  GI offered colonoscopy which patient is amenable to and will be done on Monday, 12/26.  Critical care also consulted and have plans for bronchoscopy on Tuesday, 12/27.   Assessment and Plan: Cardiovascular and Mediastinum Chronic diastolic CHF (congestive heart failure) (HCC) Assessment & Plan Noted on echocardiogram from 3 years ago.  Given hypertensive urgency issues, suspect that blood transfusion has led to increased volume and volume overload.  Checking BNP.  Essential hypertension Assessment & Plan Blood pressure slightly elevated.  Continue metoprolol.  Also on as needed clonidine and once a week Maxide.  Checking BNP.  Digestive Cystic mass of pancreas Assessment & Plan Possible primary.  Have ordered CA 19-9  * GI bleed Assessment & Plan Unclear etiology.  Findings on EGD not proportionate for blood loss.  Colonoscopy on Monday.  Musculoskeletal and Integument Pathologic compression fracture of thoracic vertebra  [facility]) Assessment & Plan As needed hydrocodone.  PT recommends outpatient PT  Hematopoietic and Hemostatic Thrombocytosis Assessment & Plan Likely reactive  Other Leukocytosis Assessment & Plan Suspect stress margination.  No antibiotics and white blood cell count down to 11 on the following day.   Hypokalemia Assessment & Plan Replace as needed  Hypoalbuminemia Assessment & Plan Decreased p.o. intake likely in the setting of malignancy.  Nutrition to see  Lung mass Assessment & Plan Possible primary versus metastatic disease.  For bronchoscopy on Tuesday  Acute blood loss anemia Assessment & Plan Patient refuses blood transfusion.  Will accept IV iron.  As above, colonoscopy on Monday.  Hemoglobin actually improved from previous day.    Body mass index is 24.62 kg/m.  Nutrition Problem: Inadequate oral intake Etiology: poor appetite     Consultants: Gastroenterology Critical care/pulmonary Palliative care  Procedures: Planned colonoscopy 12/26 Plan bronchoscopy 12/27 Status post EGD  Antimicrobials: None  Code Status: Full code   Subjective: No complaints.  Objective: Vitals:   05/05/21 0427 05/05/21 0759  BP: (!) 142/50 (!) 143/49  Pulse: 67 67  Resp: 15 16  Temp: 97.8 F (36.6 C) 98.1 F (36.7 C)  SpO2: 98% 97%    Intake/Output Summary (Last 24 hours) at 05/05/2021 1506 Last data filed at 05/05/2021 1300 Gross per 24 hour  Intake 460 ml  Output --  Net 460 ml    Filed Weights   05/02/21 2325 05/04/21 0945  Weight: 67.1 kg 67.1 kg   Body mass index is 24.62 kg/m.  Exam:  General: Alert and oriented x3, no acute distress HEENT: Normocephalic atraumatic, mucous membranes are moist Cardiovascular: Regular  rate and rhythm, S1-S2 Respiratory: Clear to auscultation bilaterally Abdomen: Soft, nontender, nondistended, positive bowel sounds Musculoskeletal: No clubbing or cyanosis or edema Skin: No skin break, tears or  lesions Psychiatry: Appropriate, no evidence of psychoses Neurology: No focal deficits  Data Reviewed: Today's labs reviewed.  Hemoglobin slightly improved.  White blood cell count on high end of normal  Disposition:  Status is: Inpatient  Remains inpatient appropriate because: Work-up ongoing with procedure scheduled for Monday and Tuesday    Family Communication: Left message for son  DVT Prophylaxis: SCDs Start: 05/03/21 4621    Author: Annita Brod ,MD 05/05/2021 3:06 PM  To reach On-call, see care teams to locate the attending and reach out via [email or web address]. Between 7PM-7AM, please contact night-coverage If you still have difficulty reaching the attending provider, please page the [facility] (Director on Call) for [facility] on amion for assistance.

## 2021-05-06 ENCOUNTER — Encounter (HOSPITAL_COMMUNITY): Payer: Self-pay | Admitting: Gastroenterology

## 2021-05-06 DIAGNOSIS — I10 Essential (primary) hypertension: Secondary | ICD-10-CM | POA: Diagnosis not present

## 2021-05-06 DIAGNOSIS — D62 Acute posthemorrhagic anemia: Secondary | ICD-10-CM | POA: Diagnosis not present

## 2021-05-06 DIAGNOSIS — K862 Cyst of pancreas: Secondary | ICD-10-CM | POA: Diagnosis not present

## 2021-05-06 DIAGNOSIS — K922 Gastrointestinal hemorrhage, unspecified: Secondary | ICD-10-CM | POA: Diagnosis not present

## 2021-05-06 LAB — BPAM RBC
Blood Product Expiration Date: 202301022359
Unit Type and Rh: 7300

## 2021-05-06 LAB — CBC
HCT: 22.1 % — ABNORMAL LOW (ref 36.0–46.0)
Hemoglobin: 6.4 g/dL — CL (ref 12.0–15.0)
MCH: 21.2 pg — ABNORMAL LOW (ref 26.0–34.0)
MCHC: 29 g/dL — ABNORMAL LOW (ref 30.0–36.0)
MCV: 73.2 fL — ABNORMAL LOW (ref 80.0–100.0)
Platelets: 541 10*3/uL — ABNORMAL HIGH (ref 150–400)
RBC: 3.02 MIL/uL — ABNORMAL LOW (ref 3.87–5.11)
RDW: 20.6 % — ABNORMAL HIGH (ref 11.5–15.5)
WBC: 11.5 10*3/uL — ABNORMAL HIGH (ref 4.0–10.5)
nRBC: 0 % (ref 0.0–0.2)

## 2021-05-06 LAB — COMPREHENSIVE METABOLIC PANEL
ALT: 9 U/L (ref 0–44)
AST: 18 U/L (ref 15–41)
Albumin: 2.6 g/dL — ABNORMAL LOW (ref 3.5–5.0)
Alkaline Phosphatase: 104 U/L (ref 38–126)
Anion gap: 10 (ref 5–15)
BUN: 11 mg/dL (ref 8–23)
CO2: 22 mmol/L (ref 22–32)
Calcium: 8.3 mg/dL — ABNORMAL LOW (ref 8.9–10.3)
Chloride: 102 mmol/L (ref 98–111)
Creatinine, Ser: 0.68 mg/dL (ref 0.44–1.00)
GFR, Estimated: >60 mL/min (ref >60)
Glucose, Bld: 128 mg/dL — ABNORMAL HIGH (ref 70–99)
Potassium: 4.1 mmol/L (ref 3.5–5.1)
Sodium: 134 mmol/L — ABNORMAL LOW (ref 135–145)
Total Bilirubin: UNDETERMINED mg/dL (ref 0.3–1.2)
Total Protein: 5.6 g/dL — ABNORMAL LOW (ref 6.5–8.1)

## 2021-05-06 LAB — PREPARE RBC (CROSSMATCH)

## 2021-05-06 LAB — TYPE AND SCREEN
ABO/RH(D): B POS
Antibody Screen: NEGATIVE
Unit division: 0

## 2021-05-06 LAB — CANCER ANTIGEN 19-9: CA 19-9: 7 U/mL (ref 0–35)

## 2021-05-06 LAB — LIPASE, BLOOD: Lipase: 24 U/L (ref 11–51)

## 2021-05-06 MED ORDER — MORPHINE SULFATE (PF) 2 MG/ML IV SOLN
2.0000 mg | INTRAVENOUS | Status: DC | PRN
  Administered 2021-05-06: 13:00:00 2 mg via INTRAVENOUS

## 2021-05-06 MED ORDER — SODIUM CHLORIDE 0.9% IV SOLUTION: Freq: Once | INTRAVENOUS | Status: AC

## 2021-05-06 MED ORDER — MORPHINE SULFATE (PF) 2 MG/ML IV SOLN
2.0000 mg | INTRAVENOUS | Status: DC | PRN
  Administered 2021-05-07 – 2021-05-12 (×7): 2 mg via INTRAVENOUS
  Filled 2021-05-06 (×8): qty 1

## 2021-05-06 MED ORDER — MORPHINE SULFATE (PF) 2 MG/ML IV SOLN
2.0000 mg | Freq: Once | INTRAVENOUS | Status: AC | PRN
  Administered 2021-05-06: 09:00:00 2 mg via INTRAVENOUS
  Filled 2021-05-06: qty 1

## 2021-05-06 MED ORDER — FUROSEMIDE 10 MG/ML IJ SOLN
20.0000 mg | Freq: Once | INTRAMUSCULAR | Status: AC
  Administered 2021-05-07: 01:00:00 20 mg via INTRAVENOUS
  Filled 2021-05-06: qty 2

## 2021-05-06 MED ORDER — LIDOCAINE 5 % EX PTCH
1.0000 | MEDICATED_PATCH | CUTANEOUS | Status: DC
  Administered 2021-05-10 – 2021-05-25 (×16): 1 via TRANSDERMAL
  Filled 2021-05-06 (×20): qty 1

## 2021-05-06 MED ORDER — FUROSEMIDE 10 MG/ML IJ SOLN
40.0000 mg | Freq: Once | INTRAMUSCULAR | Status: AC
  Administered 2021-05-06: 11:00:00 40 mg via INTRAVENOUS
  Filled 2021-05-06: qty 4

## 2021-05-06 NOTE — Progress Notes (Signed)
Attempted to collect unit of blood from blood bank but notified that blood not ready yet. Advised to return back around 1930hrs. Will pass on information to oncoming nurse

## 2021-05-06 NOTE — Progress Notes (Signed)
Pt noted with critical hemoglobin level-6.4. Pt refusing blood transfusion. On call X. Blount,NP aware.

## 2021-05-06 NOTE — Progress Notes (Signed)
OT Cancellation Note  Patient Details Name: Robin Estrada MRN: [id number] DOB: 06/01/1945   Cancelled Treatment:    Reason Eval/Treat Not Completed: Fatigue/lethargy limiting ability to participate;Pain limiting ability to participate: Pt reports back pain, just receiving Morphine, and now asking to sleep and declining OT Evaluation. Pt requests that OT return after noon "then maybe". Will make best efforts to return per pt's wishes.   Julien Girt 05/06/2021, 9:52 AM

## 2021-05-06 NOTE — Progress Notes (Addendum)
[facility] Progress Note  Patient: Robin Estrada    VPX:[id number]  DOA: 05/02/2021    Date of Service: the patient was seen and examined on 05/06/2021  Brief hospital course: 75 year old [place] female with history of breast cancer and hypertension admitted on 12/22 with complaints of anorexia and weight loss x3 months and found to have hemoglobin at 6.9 with Hemoccult positive.  CT of abdomen pelvis with contrast notes a cystic lesion of tail of pancreas with infiltrative disease of the liver and chest x-ray notes right upper lobe mass.  Admitted to hospital service.  GI consulted and patient underwent EGD on 12/24, noting gastritis and ulcer, not consistent with level of hemoglobin.  Palliative care consulted and patient is understanding of her overall condition and likely diagnosis and prognosis.  She is considering being a DNR, but is not ready to make that decision at this time.  She has refused a blood transfusion, but amenable to IV iron.  GI offered colonoscopy which patient is amenable to and will possibly done on Tuesday after bronchoscopy or on Wednesday.  Critical care also consulted and have plans for bronchoscopy on Tuesday, 12/27.   Assessment and Plan: Cardiovascular and Mediastinum Acute on chronic diastolic CHF (congestive heart failure) (HCC) Assessment & Plan Noted on echocardiogram from 3 years ago.  Given hypertensive urgency issues, suspect that blood transfusion has led to increased volume and volume overload.  BNP at 404.  Have started gentle IV Lasix  Essential hypertension Assessment & Plan Blood pressure slightly elevated secondary to volume overload.  BNP of 404.  Gentle IV Lasix..  Continue metoprolol.  Also on as needed clonidine and once a week Maxide.   Digestive Cystic mass of pancreas Assessment & Plan CA 19-9 level normal.  Her back pain could be from thoracic compression fracture or from pancreatitis.  Checking lipase as well  * GI  bleed Assessment & Plan Unclear etiology.  Findings on EGD not proportionate for blood loss.  Colonoscopy possibly tomorrow depending on when bronchoscopy is.  Otherwise on Wednesday.  Musculoskeletal and Integument Pathologic compression fracture of thoracic vertebra [facility]) Assessment & Plan As needed hydrocodone.  PT recommends outpatient PT  Hematopoietic and Hemostatic Thrombocytosis Assessment & Plan Likely reactive  Other Leukocytosis Assessment & Plan Suspect stress margination.  No antibiotics and white blood cell count down to 11 on the following day.  Labs from today are pending.  Hypokalemia Assessment & Plan Replace as needed  Hypoalbuminemia Assessment & Plan Decreased p.o. intake likely in the setting of malignancy.  Nutrition to see  Lung mass Assessment & Plan Possible primary versus metastatic disease.  For bronchoscopy on Tuesday  Acute blood loss anemia Assessment & Plan Patient initially was refusing blood transfusion, only excepting IV iron.  She has changed this and says that she will accept a transfusion from her son.  Her son had relayed that if his blood could not be ready in time, she will take blood from outside donors.  However when I discussed this with the patient, the patient said that she had to think it over and talk more with her son before committing to doing that.  Addendum: Pt's son arrived and after discussion, she is amenable to transfusion.  Formal consent obtained and will order one unit transfused tonight with Lasix after.     Body mass index is 24.62 kg/m.  Nutrition Problem: Inadequate oral intake Etiology: poor appetite     Consultants: Gastroenterology Critical care/pulmonary Palliative care  Procedures:  Planned colonoscopy 12/26 Plan bronchoscopy 12/27 Status post EGD  Antimicrobials: None  Code Status: Full code   Subjective: Patient complains of significant lower back pain  Objective: Vitals:   05/06/21  0424 05/06/21 0900  BP: (!) 134/41 (!) 139/53  Pulse: 72 76  Resp:  17  Temp: 98.9 F (37.2 C) 97.9 F (36.6 C)  SpO2: 95% 100%    Intake/Output Summary (Last 24 hours) at 05/06/2021 1412 Last data filed at 05/06/2021 0900 Gross per 24 hour  Intake 300 ml  Output --  Net 300 ml    Filed Weights   05/02/21 2325 05/04/21 0945  Weight: 67.1 kg 67.1 kg   Body mass index is 24.62 kg/m.  Exam:  General: Alert and oriented x3, mild distress from pain HEENT: Normocephalic atraumatic, mucous membranes are moist Cardiovascular: Regular rate and rhythm, S1-S2 Respiratory: Clear to auscultation bilaterally Abdomen: Soft, nontender, nondistended, positive bowel sounds Musculoskeletal: No clubbing or cyanosis or edema Skin: No skin break, tears or lesions Psychiatry: Appropriate, no evidence of psychoses Neurology: No focal deficits  Data Reviewed: Today's labs reviewed.  Hemoglobin slightly improved.  White blood cell count on high end of normal  Disposition:  Status is: Inpatient  Remains inpatient appropriate because: Work-up ongoing with procedures planned for Tuesday and possibly Wednesday    Family Communication: Patient declined for me to call her son today.  DVT Prophylaxis: SCDs Start: 05/03/21 3762    Author: Annita Brod ,MD 05/06/2021 2:12 PM  To reach On-call, see care teams to locate the attending and reach out via [email or web address]. Between 7PM-7AM, please contact night-coverage If you still have difficulty reaching the attending provider, please page the [facility] (Director on Call) for [facility] on amion for assistance.

## 2021-05-06 NOTE — Progress Notes (Signed)
Pt's son communicated to this nurse that he had a conversation with his mother and that she is agreeable to a blood transfusion. Requesting to speak to the MD, who was notified via secure chat

## 2021-05-06 NOTE — Progress Notes (Signed)
OT Cancellation Note  Patient Details Name: Felica Chargois MRN: [id number] DOB: 06-23-1945   Cancelled Treatment:    Reason Eval/Treat Not Completed: Medical issues which prohibited therapy: Most recent draw has Hgb at 6.4. Per chart, pt refusing transfusion.  Physician notified re: recommendations for moving forward vs hold OT Evaluation today.  Will await response.   Julien Girt 05/06/2021, 8:23 AM

## 2021-05-06 NOTE — Progress Notes (Signed)
Nutrition Follow-up  DOCUMENTATION CODES:   Not applicable  INTERVENTION:   Recommend liberalizing diet to regular. Received ok from MD.  Encourage good PO intake  Continue Multivitamin w/ minerals daily Continue Ensure Enlive po BID, each supplement provides 350 kcal and 20 grams of protein Continue 30 ml ProSource Plus BID, each supplement provides 100 kcals and 15 grams protein.   NUTRITION DIAGNOSIS:   Inadequate oral intake related to poor appetite as evidenced by per patient/family report. - Ongoing  GOAL:   Patient will meet greater than or equal to 90% of their needs - Ongoing   MONITOR:   PO intake, Supplement acceptance, Weight trends  REASON FOR ASSESSMENT:   Malnutrition Screening Tool    ASSESSMENT:   75 y.o. 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 In the ED, hemoglobin was 6.9 and stool guaiac noted to be positive. Imaging showed right upper lobe mass concerning for primary pulmonary neoplasm as well as diffuse metastatic osseous disease with T11 pathologic compression fracture. She declined blood products in the ED. Admitted to [facility].  RD received another consult for assessment of nutrition status.    Pt request RD leave due to being on the phone. Pt declining OT twice today.   Per EMR, pt intake includes: 12/24: Lunch 0%, Dinner 75% 12/25: Breakfast 0%, Lunch 0%, Dinner 75%  Per meds history, pt has been accepting all ONS while on a diet and can accept them.   Pt with planned for bronchoscopy on 12/27 and colonoscopy during admission.   Medications reviewed and include: Lasix, MVI, Protonix Labs reviewed: Sodium 134  NUTRITION - FOCUSED PHYSICAL EXAM:  Deferred to follow-up.   Diet Order:   Diet Order              Diet Heart Room service appropriate? Yes; Fluid consistency: Thin  Diet effective now                   EDUCATION NEEDS:   No education needs have been identified at this time  Skin:  Skin Assessment: Reviewed RN Assessment  Last BM:  05/02/2021  Height:   Ht Readings from Last 1 Encounters:  05/04/21 5\' 5"  (1.651 m)    Weight:   Wt Readings from Last 1 Encounters:  05/04/21 67.1 kg    Ideal Body Weight:  56.8 kg  BMI:  Body mass index is 24.62 kg/m.  Estimated Nutritional Needs:   Kcal:  1700-1900  Protein:  85-95 grams  Fluid:  >1.7L    Robin Estrada, RD, LDN Clinical Dietitian See Shea Evans for contact information.

## 2021-05-07 ENCOUNTER — Encounter (HOSPITAL_COMMUNITY): Payer: Self-pay | Admitting: Internal Medicine

## 2021-05-07 ENCOUNTER — Inpatient Hospital Stay (HOSPITAL_COMMUNITY): Payer: Medicare HMO | Admitting: Anesthesiology

## 2021-05-07 ENCOUNTER — Inpatient Hospital Stay (HOSPITAL_COMMUNITY): Payer: Medicare HMO

## 2021-05-07 ENCOUNTER — Encounter (HOSPITAL_COMMUNITY)
Admission: EM | Disposition: A | Discharge status: Home / Self Care | Payer: Self-pay | Source: Home / Self Care | Attending: Family Medicine

## 2021-05-07 DIAGNOSIS — K922 Gastrointestinal hemorrhage, unspecified: Secondary | ICD-10-CM | POA: Diagnosis not present

## 2021-05-07 DIAGNOSIS — I10 Essential (primary) hypertension: Secondary | ICD-10-CM | POA: Diagnosis not present

## 2021-05-07 DIAGNOSIS — D509 Iron deficiency anemia, unspecified: Secondary | ICD-10-CM | POA: Diagnosis present

## 2021-05-07 DIAGNOSIS — C7951 Secondary malignant neoplasm of bone: Secondary | ICD-10-CM | POA: Diagnosis not present

## 2021-05-07 DIAGNOSIS — K862 Cyst of pancreas: Secondary | ICD-10-CM | POA: Diagnosis not present

## 2021-05-07 DIAGNOSIS — R918 Other nonspecific abnormal finding of lung field: Secondary | ICD-10-CM | POA: Diagnosis not present

## 2021-05-07 DIAGNOSIS — K253 Acute gastric ulcer without hemorrhage or perforation: Secondary | ICD-10-CM | POA: Diagnosis not present

## 2021-05-07 DIAGNOSIS — Z20822 Contact with and (suspected) exposure to covid-19: Secondary | ICD-10-CM | POA: Diagnosis not present

## 2021-05-07 DIAGNOSIS — D62 Acute posthemorrhagic anemia: Secondary | ICD-10-CM | POA: Diagnosis not present

## 2021-05-07 DIAGNOSIS — Z515 Encounter for palliative care: Secondary | ICD-10-CM | POA: Diagnosis not present

## 2021-05-07 DIAGNOSIS — I5033 Acute on chronic diastolic (congestive) heart failure: Secondary | ICD-10-CM | POA: Diagnosis not present

## 2021-05-07 HISTORY — PX: HEMOSTASIS CONTROL: SHX6838

## 2021-05-07 HISTORY — PX: BRONCHIAL WASHINGS: SHX5105

## 2021-05-07 HISTORY — PX: BRONCHIAL NEEDLE ASPIRATION BIOPSY: SHX5106

## 2021-05-07 HISTORY — PX: BRONCHIAL BRUSHINGS: SHX5108

## 2021-05-07 HISTORY — PX: BRONCHIAL BIOPSY: SHX5109

## 2021-05-07 HISTORY — PX: VIDEO BRONCHOSCOPY WITH ENDOBRONCHIAL ULTRASOUND: SHX6177

## 2021-05-07 LAB — TYPE AND SCREEN
ABO/RH(D): B POS
Antibody Screen: NEGATIVE
Unit division: 0

## 2021-05-07 LAB — COMPREHENSIVE METABOLIC PANEL
ALT: 10 U/L (ref 0–44)
AST: 10 U/L — ABNORMAL LOW (ref 15–41)
Albumin: 2.7 g/dL — ABNORMAL LOW (ref 3.5–5.0)
Alkaline Phosphatase: 102 U/L (ref 38–126)
Anion gap: 11 (ref 5–15)
BUN: 9 mg/dL (ref 8–23)
CO2: 27 mmol/L (ref 22–32)
Calcium: 8.6 mg/dL — ABNORMAL LOW (ref 8.9–10.3)
Chloride: 97 mmol/L — ABNORMAL LOW (ref 98–111)
Creatinine, Ser: 0.66 mg/dL (ref 0.44–1.00)
GFR, Estimated: >60 mL/min (ref >60)
Glucose, Bld: 136 mg/dL — ABNORMAL HIGH (ref 70–99)
Potassium: 3.2 mmol/L — ABNORMAL LOW (ref 3.5–5.1)
Sodium: 135 mmol/L (ref 135–145)
Total Bilirubin: 0.7 mg/dL (ref 0.3–1.2)
Total Protein: 6 g/dL — ABNORMAL LOW (ref 6.5–8.1)

## 2021-05-07 LAB — CBC
HCT: 26.4 % — ABNORMAL LOW (ref 36.0–46.0)
Hemoglobin: 7.9 g/dL — ABNORMAL LOW (ref 12.0–15.0)
MCH: 21.8 pg — ABNORMAL LOW (ref 26.0–34.0)
MCHC: 29.9 g/dL — ABNORMAL LOW (ref 30.0–36.0)
MCV: 72.7 fL — ABNORMAL LOW (ref 80.0–100.0)
Platelets: 518 10*3/uL — ABNORMAL HIGH (ref 150–400)
RBC: 3.63 MIL/uL — ABNORMAL LOW (ref 3.87–5.11)
RDW: 20.5 % — ABNORMAL HIGH (ref 11.5–15.5)
WBC: 13.8 10*3/uL — ABNORMAL HIGH (ref 4.0–10.5)
nRBC: 0 % (ref 0.0–0.2)

## 2021-05-07 LAB — BPAM RBC
Blood Product Expiration Date: 202301022359
ISSUE DATE / TIME: 202212262207
Unit Type and Rh: 7300

## 2021-05-07 SURGERY — BRONCHOSCOPY, WITH EBUS
Anesthesia: General

## 2021-05-07 MED ORDER — PEG-KCL-NACL-NASULF-NA ASC-C 100 G PO SOLR: 1.0000 | Freq: Once | ORAL | Status: DC

## 2021-05-07 MED ORDER — FENTANYL CITRATE (PF) 100 MCG/2ML IJ SOLN: 25.0000 ug | INTRAMUSCULAR | Status: DC | PRN

## 2021-05-07 MED ORDER — CHLORHEXIDINE GLUCONATE 0.12 % MT SOLN
OROMUCOSAL | Status: AC
  Administered 2021-05-07: 09:00:00 15 mL via OROMUCOSAL
  Filled 2021-05-07: qty 15

## 2021-05-07 MED ORDER — PROPOFOL 10 MG/ML IV BOLUS
INTRAVENOUS | Status: DC | PRN
  Administered 2021-05-07: 100 mg via INTRAVENOUS

## 2021-05-07 MED ORDER — ACETAMINOPHEN 500 MG PO TABS: 1000.0000 mg | ORAL_TABLET | Freq: Once | ORAL | Status: DC

## 2021-05-07 MED ORDER — LACTATED RINGERS IV SOLN: INTRAVENOUS | Status: DC

## 2021-05-07 MED ORDER — METOCLOPRAMIDE HCL 5 MG/ML IJ SOLN
10.0000 mg | Freq: Four times a day (QID) | INTRAMUSCULAR | Status: AC
  Administered 2021-05-08 (×2): 10 mg via INTRAVENOUS
  Filled 2021-05-07 (×2): qty 2

## 2021-05-07 MED ORDER — SUGAMMADEX SODIUM 200 MG/2ML IV SOLN
INTRAVENOUS | Status: DC | PRN
  Administered 2021-05-07 (×2): 100 mg via INTRAVENOUS

## 2021-05-07 MED ORDER — PEG-KCL-NACL-NASULF-NA ASC-C 100 G PO SOLR
0.5000 | Freq: Once | ORAL | Status: AC
  Administered 2021-05-08: 100 g via ORAL
  Filled 2021-05-07: qty 1

## 2021-05-07 MED ORDER — CHLORHEXIDINE GLUCONATE 0.12 % MT SOLN: 15.0000 mL | Freq: Once | OROMUCOSAL | Status: AC

## 2021-05-07 MED ORDER — FENTANYL CITRATE (PF) 250 MCG/5ML IJ SOLN
INTRAMUSCULAR | Status: DC | PRN
  Administered 2021-05-07: 50 ug via INTRAVENOUS

## 2021-05-07 MED ORDER — AMISULPRIDE (ANTIEMETIC) 5 MG/2ML IV SOLN
10.0000 mg | Freq: Once | INTRAVENOUS | Status: DC | PRN
  Filled 2021-05-07: qty 4

## 2021-05-07 MED ORDER — PROMETHAZINE HCL 25 MG/ML IJ SOLN: 6.2500 mg | INTRAMUSCULAR | Status: DC | PRN

## 2021-05-07 MED ORDER — LIDOCAINE 2% (20 MG/ML) 5 ML SYRINGE
INTRAMUSCULAR | Status: DC | PRN
  Administered 2021-05-07: 100 mg via INTRAVENOUS

## 2021-05-07 MED ORDER — PEG-KCL-NACL-NASULF-NA ASC-C 100 G PO SOLR
0.5000 | Freq: Once | ORAL | Status: AC
  Administered 2021-05-08: 17:00:00 100 g via ORAL
  Filled 2021-05-07: qty 1

## 2021-05-07 MED ORDER — BISACODYL 5 MG PO TBEC
20.0000 mg | DELAYED_RELEASE_TABLET | Freq: Once | ORAL | Status: AC
  Administered 2021-05-08: 14:00:00 20 mg via ORAL
  Filled 2021-05-07: qty 4

## 2021-05-07 MED ORDER — ONDANSETRON HCL 4 MG/2ML IJ SOLN
INTRAMUSCULAR | Status: DC | PRN
  Administered 2021-05-07: 4 mg via INTRAVENOUS

## 2021-05-07 MED ORDER — ROCURONIUM BROMIDE 10 MG/ML (PF) SYRINGE
PREFILLED_SYRINGE | INTRAVENOUS | Status: DC | PRN
  Administered 2021-05-07: 80 mg via INTRAVENOUS

## 2021-05-07 NOTE — Transfer of Care (Signed)
Immediate Anesthesia Transfer of Care Note  Patient: Robin Estrada  Procedure(s) Performed: VIDEO BRONCHOSCOPY WITH ENDOBRONCHIAL ULTRASOUND BRONCHIAL BRUSHINGS BRONCHIAL WASHINGS BRONCHIAL NEEDLE ASPIRATION BIOPSIES HEMOSTASIS CONTROL BRONCHIAL BIOPSIES  Patient Location: PACU and Endoscopy Unit  Anesthesia Type:General  Level of Consciousness: awake and alert   Airway & Oxygen Therapy: Patient Spontanous Breathing and Patient connected to face mask oxygen  Post-op Assessment: Report given to RN and Post -op Vital signs reviewed and stable  Post vital signs: Reviewed and stable  Last Vitals:  Vitals Value Taken Time  BP    Temp    Pulse 63 05/07/21 1042  Resp 20 05/07/21 1042  SpO2 91 % 05/07/21 1042  Vitals shown include unvalidated device data.  Last Pain:  Vitals:   05/07/21 0827  TempSrc:   PainSc: 0-No pain      Patients Stated Pain Goal: 0 (94/44/61 9012)  Complications: No notable events documented.

## 2021-05-07 NOTE — Progress Notes (Signed)
[facility] Progress Note  Patient: Robin Estrada    WEX:[id number]  DOA: 05/02/2021    Date of Service: the patient was seen and examined on 05/07/2021  Brief hospital course: 75 year old [place] female with history of breast cancer and hypertension admitted on 12/22 with complaints of anorexia and weight loss x3 months and found to have hemoglobin at 6.9 with Hemoccult positive.  CT of abdomen pelvis with contrast notes a cystic lesion of tail of pancreas with infiltrative disease of the liver and chest x-ray notes right upper lobe mass.  Admitted to hospital service.  GI consulted and patient underwent EGD on 12/24, noting gastritis and ulcer, not consistent with level of hemoglobin.  Palliative care consulted and patient is understanding of her overall condition and likely diagnosis and prognosis.  For now, she wishes to be a full code.  Initially refused blood transfusion, but after discussion with son, amenable.  Patient received 1 unit packed red blood cells on the night of 12/26.  Also has received IV iron.  Underwent bronchoscopy on the morning of 12/27 with multiple areas biopsied and pathology pending.  Plan for colonoscopy on 12/28.   Assessment and Plan: Cardiovascular and Mediastinum Acute on chronic diastolic CHF (congestive heart failure) (HCC) Assessment & Plan Noted on echocardiogram from 3 years ago.  Given hypertensive urgency issues, suspect that blood transfusion has led to increased volume and volume overload.  BNP at 404.  Have started gentle IV Lasix  Essential hypertension Assessment & Plan Blood pressure slightly elevated secondary to volume overload.  BNP of 404.  Gentle IV Lasix..  Continue metoprolol.  Also on as needed clonidine and once a week Maxide.   Digestive Cystic mass of pancreas Assessment & Plan CA 19-9 level normal.  Her back pain could be from thoracic compression fracture or from pancreatitis.  Checking lipase as well  * GI  bleed Assessment & Plan Unclear etiology.  Findings on EGD not proportionate for blood loss.  Colonoscopy possibly tomorrow depending on when bronchoscopy is.  Otherwise on Wednesday.  Musculoskeletal and Integument Pathologic compression fracture of thoracic vertebra [facility]) Assessment & Plan As needed hydrocodone.  PT recommends outpatient PT  Hematopoietic and Hemostatic Thrombocytosis Assessment & Plan Likely reactive  Other Leukocytosis Assessment & Plan Suspect stress margination.  No antibiotics and white blood cell count down to 11 on the following day.  Slightly elevated today.  Hypokalemia Assessment & Plan Replace as needed  Hypoalbuminemia Assessment & Plan Decreased p.o. intake likely in the setting of malignancy.  Nutrition to see  Lung mass Assessment & Plan Possible primary versus metastatic disease.  For bronchoscopy on Tuesday  Acute blood loss anemia Assessment & Plan Patient initially was refusing blood transfusion, only excepting IV iron.  After discussion with son, now amenable.  Underwent transfusion of 1 unit on 12/26.  Continue to monitor hemoglobin.     Body mass index is 24.62 kg/m.  Nutrition Problem: Inadequate oral intake Etiology: poor appetite     Consultants: Gastroenterology Critical care/pulmonary Palliative care  Procedures: Planned colonoscopy 12/28 Status post bronchoscopy 12/27 with multiple biopsies.  Results are pending. Status post EGD  Antimicrobials: None  Code Status: Full code   Subjective: Patient seen post bronchoscopy.  Sleeping heavily.  Objective: Vitals:   05/07/21 1212 05/07/21 1238  BP: (!) 141/43 (!) 142/57  Pulse: 70 76  Resp: (!) 31 16  Temp:  98.4 F (36.9 C)  SpO2: 98% 92%    Intake/Output Summary (Last 24 hours)  at 05/07/2021 1445 Last data filed at 05/07/2021 1027 Gross per 24 hour  Intake 1060 ml  Output 0 ml  Net 1060 ml    Filed Weights   05/02/21 2325 05/04/21 0945 05/07/21  0815  Weight: 67.1 kg 67.1 kg 67.1 kg   Body mass index is 24.62 kg/m.  Exam:  General: Resting comfortably. HEENT: Normocephalic atraumatic, mucous membranes are moist Cardiovascular: Regular rate and rhythm, S1-S2 Respiratory: Clear to auscultation bilaterally Abdomen: Soft, nontender, nondistended, positive bowel sounds Musculoskeletal: No clubbing or cyanosis or edema Skin: No skin break, tears or lesions Psychiatry: Appropriate, no evidence of psychoses Neurology: No focal deficits  Data Reviewed: Today's labs reviewed.  Hemoglobin up following transfusion.  White blood cell count slightly elevated to 13.8.  Rest of labs unremarkable  Disposition:  Status is: Inpatient  Remains inpatient appropriate because: Work-up ongoing with colonoscopy on 12/28 and pathology    Family Communication: Updated son by phone.  DVT Prophylaxis: SCDs Start: 05/03/21 7588    Author: Annita Brod ,MD 05/07/2021 2:45 PM  To reach On-call, see care teams to locate the attending and reach out via [email or web address]. Between 7PM-7AM, please contact night-coverage If you still have difficulty reaching the attending provider, please page the [facility] (Director on Call) for [facility] on amion for assistance.

## 2021-05-07 NOTE — Op Note (Signed)
[facility] Cardiopulmonary Patient Name: Robin Estrada Date: 05/07/2021 MRN: [id number] Attending MD: Rodman Pickle , MD Date of Birth: 1945-11-09 CSN: Finalized Age: 75 Admit Type: Inpatient Gender: Female Procedure:             Bronchoscopy Indications:           Right upper lobe mass, Right hilar mass, Mediastinal                         mass Providers:             Rodman Pickle, MD, Jeanella Cara, RN, [facility] Technician, Technician Referring MD:           Medicines:             General Anesthesia Complications:         No immediate complications Estimated Blood Loss:  Estimated blood loss was minimal. Procedure:             Pre-Anesthesia Assessment:                        - A History and Physical has been performed. Patient                         meds and allergies have been reviewed. The risks and                         benefits of the procedure and the sedation options and                         risks were discussed with the patient. All questions                         were answered and informed consent was obtained.                         Patient identification and proposed procedure were                         verified prior to the procedure by the physician in                         the pre-procedure area. Mental Status Examination:                         alert and oriented. Airway Examination: normal                         oropharyngeal airway. Respiratory Examination: clear                         to auscultation. CV Examination: RRR, no murmurs, no                         S3 or S4. ASA Grade Assessment: III - A patient with  severe systemic disease. After reviewing the risks and                         benefits, the patient was deemed in satisfactory                         condition to undergo the procedure. The anesthesia                         plan was to use general  anesthesia. Immediately prior                         to administration of medications, the patient was                         re-assessed for adequacy to receive sedatives. The                         heart rate, respiratory rate, oxygen saturations,                         blood pressure, adequacy of pulmonary ventilation, and                         response to care were monitored throughout the                         procedure. The physical status of the patient was                         re-assessed after the procedure.                        After obtaining informed consent, the bronchoscope was                         passed under direct vision. Throughout the procedure,                         the patient's blood pressure, pulse, and oxygen                         saturations were monitored continuously. the [id number]                         )[id number]) Olympus broncoscope was introduced through                         the mouth, via the endotracheal tube (the patient was                         intubated for the procedure) and advanced to the                         tracheobronchial tree. the BF-[id number] ([id number]) Olympus                         bronchial ultraound scope was introduced through the  mouth, via the endotracheal tube (the patient was                         intubated for the procedure) and advanced to the                         tracheobronchial tree. The procedure was accomplished                         without difficulty. The patient tolerated the                         procedure well. Scope In: Scope Out: Findings:      Bronchoalveolar lavage was performed in the RUL posterior segment (B2)       of the lung and sent for routine cytology. 120 mL of fluid were       instilled. 35 mL were returned. The return was cloudy. There were no       mucoid plugs in the return fluid.      Brushings of a lesion were obtained in the apical segment of  the right       upper lobe with a cytology brush and sent for routine cytology. Three       samples were obtained.      An endobronchial ultrasound endoscope was utilized in order to assist       with fine needle aspiration in the subcarinal area and in the       mediastinum. Samples from station 7 and 4R obtained for slides and cell       block. Forcep biopsy x 2 of minor carina between apical and anterior       segment of right upper lobe sent to cytology/surgical pathology. Impression:            - Right upper lobe mass                        - Right hilar mass                        - Mediastinal mass                        - Bronchoalveolar lavage was performed.                        - Brushings were obtained.                        - Endobronchial ultrasound was performed.                        - Systematic lymph node sizing, sampling, ultrasound                         visualization and preliminary cytology was performed. Moderate Sedation:      General anesthesia Recommendation:        - Await cytology results. Procedure Code(s):     --- Professional ---                        [id number], Bronchoscopy, rigid or flexible, including  fluoroscopic guidance, when performed; with bronchial                         alveolar lavage                        [phone], Bronchoscopy, rigid or flexible, including                         fluoroscopic guidance, when performed; with brushing                         or protected brushings                        [id number], Bronchoscopy, rigid or flexible, including                         fluoroscopic guidance, when performed; with                         transendoscopic endobronchial ultrasound (EBUS) during                         bronchoscopic diagnostic or therapeutic                         intervention(s) for peripheral lesion(s) (List                         separately in addition to code for primary                          procedure[s]) Diagnosis Code(s):     --- Professional ---                        R93.89, Abnormal findings on diagnostic imaging of                         other specified body structures                        R91.8, Other nonspecific abnormal finding of lung field CPT copyright 2019 [employer]. All rights reserved. The codes documented in this report are preliminary and upon coder review may  be revised to meet current compliance requirements. Rodman Pickle, MD 05/07/2021 10:57:22 AM This report has been signed electronically. Number of Addenda: 0

## 2021-05-07 NOTE — Anesthesia Preprocedure Evaluation (Addendum)
Anesthesia Evaluation  Patient identified by MRN, date of birth, ID band Patient awake    Reviewed: Allergy & Precautions, NPO status , Patient's Chart, lab work & pertinent test results  History of Anesthesia Complications Negative for: history of anesthetic complications  Airway Mallampati: II  TM Distance: >3 FB Neck ROM: Full    Dental no notable dental hx. (+) Caps, Dental Advisory Given, Implants   Pulmonary neg pulmonary ROS,    Pulmonary exam normal        Cardiovascular hypertension, Pt. on home beta blockers and Pt. on medications  Rhythm:Regular Rate:Normal + Systolic murmurs    Neuro/Psych negative neurological ROS     GI/Hepatic negative GI ROS, Neg liver ROS,   Endo/Other  negative endocrine ROS  Renal/GU negative Renal ROS     Musculoskeletal negative musculoskeletal ROS (+)   Abdominal   Peds  Hematology negative hematology ROS (+) anemia ,   Anesthesia Other Findings   Reproductive/Obstetrics                            Anesthesia Physical Anesthesia Plan  ASA: 3  Anesthesia Plan: General   Post-op Pain Management: Minimal or no pain anticipated and Tylenol PO (pre-op)   Induction: Intravenous  PONV Risk Score and Plan: 3 and Ondansetron, Dexamethasone and Treatment may vary due to age or medical condition  Airway Management Planned: Oral ETT  Additional Equipment:   Intra-op Plan:   Post-operative Plan: Extubation in OR  Informed Consent: I have reviewed the patients History and Physical, chart, labs and discussed the procedure including the risks, benefits and alternatives for the proposed anesthesia with the patient or authorized representative who has indicated his/her understanding and acceptance.     Dental advisory given and Interpreter used for interveiw  Plan Discussed with: Anesthesiologist and CRNA  Anesthesia Plan Comments:         Anesthesia Quick Evaluation

## 2021-05-07 NOTE — Progress Notes (Addendum)
Daily Rounding Note  05/07/2021, 2:06 PM  LOS: 4 days   SUBJECTIVE:   Chief complaint:   iron def anemia.  Gastic ulcer.  Lung/hilar/mediastinal masses.  Lytic spine lesions/back pain   No n/v.  Back pain better with pain meds.  Tired after bronch this AM.  Feels worn out.    OBJECTIVE:         Vital signs in last 24 hours:    Temp:  [97.1 F (36.2 C)-98.6 F (37 C)] 98.4 F (36.9 C) (12/27 1238) Pulse Rate:  [63-80] 76 (12/27 1238) Resp:  [16-32] 16 (12/27 1238) BP: (118-174)/(30-89) 142/57 (12/27 1238) SpO2:  [87 %-100 %] 92 % (12/27 1238) Weight:  [67.1 kg] 67.1 kg (12/27 0815) Last BM Date: 05/02/21 Filed Weights   05/02/21 2325 05/04/21 0945 05/07/21 0815  Weight: 67.1 kg 67.1 kg 67.1 kg   Communicated using video based interpreter.   General: looks pale, tired   Heart: RRR Chest: clear in front.  Coughs w deeper breathing Abdomen: NT, ND.  Active BS  Extremities: no CCE Neuro/Psych:  appropriate, no confusion.  Affect flat.  No tremors or gross deficits.    Intake/Output from previous day: 12/26 0701 - 12/27 0700 In: 77 [P.O.:820] Out: -   Intake/Output this shift: Total I/O In: 600 [I.V.:600] Out: 0   Lab Results: Recent Labs    05/05/21 1331 05/06/21 0442 05/07/21 0634  WBC 11.5* 11.5* 13.8*  HGB 7.0* 6.4* 7.9*  HCT 24.2* 22.1* 26.4*  PLT 582* 541* 518*   BMET Recent Labs    05/05/21 1331 05/06/21 0256 05/07/21 0634  NA 134* 134* 135  K 3.7 4.1 3.2*  CL 102 102 97*  CO2 23 22 27   GLUCOSE 138* 128* 136*  BUN 10 11 9   CREATININE 0.70 0.68 0.66  CALCIUM 8.8* 8.3* 8.6*   LFT Recent Labs    05/06/21 0256 05/07/21 0634  PROT 5.6* 6.0*  ALBUMIN 2.6* 2.7*  AST 18 10*  ALT 9 10  ALKPHOS 104 102  BILITOT QUANTITY NOT SUFFICIENT, UNABLE TO PERFORM TEST 0.7    Studies/Results: DG CHEST PORT 1 VIEW  Result Date: 05/07/2021 CLINICAL DATA:  Status post bronchoscopy EXAM:  PORTABLE CHEST 1 VIEW COMPARISON:  05/02/2021 FINDINGS: New patchy alveolar densities seen in the right upper lung fields. Previously seen 4 cm nodular density in the right upper lung fields is obscured by the new infiltrate. Findings suggest possible pulmonary hemorrhage related to bronchoscopic biopsy. Rest of the lung fields are unremarkable. There is no pleural effusion or pneumothorax. IMPRESSION: New alveolar infiltrate is seen in the right upper lung fields possibly suggesting pulmonary hemorrhage related to bronchoscopic biopsy. Electronically Signed   By: Elmer Picker M.D.   On: 05/07/2021 12:00    Scheduled Meds:  (feeding supplement) PROSource Plus  30 mL Oral BID BM   atenolol  100 mg Oral BID   feeding supplement  237 mL Oral BID BM   lidocaine  1 patch Transdermal Q24H   multivitamin with minerals  1 tablet Oral Daily   pantoprazole  40 mg Oral BID AC   sodium chloride flush  3 mL Intravenous Q12H   Continuous Infusions: PRN Meds:.acetaminophen **OR** acetaminophen, albuterol, hydrALAZINE, HYDROcodone-acetaminophen, morphine injection, ondansetron **OR** ondansetron (ZOFRAN) IV   ASSESMENT:     RUL lung mass. R Hilar and mediastinal mass.   Bronch, with lavage and brushings, ultrasound w FNA 12/27.  Await cytology  IDA, nausea vomiting. EGD 12/24: 4 cm HH.  Clean-based, non-bleeding GU, erosive gastritis.  Biopsied for HP.  Duodenal bx obtained.  1 PRBC today.  Ferrlecit infusion on 12/24.  Protonix 40 mg po bid in place.      Back pain.  Lytic lesions in spine, pelvis.     Indeterminate 1.8 cm cystic lesion in pancreatic tail.  Dr Rush Landmark suggested MRCP/MRI at some point, this is not urgent  PLAN     Await results of gastric and pulmonary cytology.      Colonoscopy. Set for 1300  on Thurs 12/29.  Today she is very exhausted so will plan clears, prep starting tmrw.      Protonix 40 mg po bid as doing.      Dr Rush Landmark is GI MD going forward.  Dr Fuller Plan is  hospital GI MD this week.     Azucena Freed  05/07/2021, 2:06 PM Phone [phone]     Attending Physician Note   I have taken an interval history, reviewed the chart and examined the patient. I personally saw the patient and performed a substantive portion of this encounter, including a complete performance of at least one of the key components, in conjunction with the APP. I agree with the APP's note, impression and recommendations.   *RUL mass - post bronchoscopy, cytology pending.  *Gastric ulcer, erosive gastritis, nausea, vomiting. Continue pantoprazole 40 mg po bid. Await biopsies.   *IDA for colonoscopy on Thursday as patient is exhausted from bronchoscopy and not able to adequately prep today for colonoscopy Wednesday.   *Pancreatic tail cystic lesion. Consider MRI/MRCP after RUL mass has been fully evaluated and after colonoscopy.   Lucio Edward, MD [facility] See AMION, [facility], for our on call provider

## 2021-05-07 NOTE — Progress Notes (Signed)
OT Cancellation Note  Patient Details Name: Robin Estrada MRN: [id number] DOB: [birth year]   Cancelled Treatment:    Reason Eval/Treat Not Completed: Patient at procedure or test/ unavailable (endoscopy. Will attempt later date)  [facility] 05/07/2021, 11:16 AM Maurie Boettcher, OT/L   Acute OT Clinical Specialist Acute Rehabilitation Services Pager [phone] Office [phone]

## 2021-05-07 NOTE — Consult Note (Signed)
NAMELatroya Estrada, MRN:  [id number], DOB:  10-Apr-1946, LOS: 4 ADMISSION DATE:  05/02/2021, CONSULTATION DATE:  05/07/2021 REFERRING MD:  Annita Brod, MD, CHIEF COMPLAINT:  lung mass   History of Present Illness:  This is a 75 yo woman with past medical history of breast cancer s/p mastectomy and radiation in 2017 who presents with several months of decreased appetite, weight loss, nausea, not feeling well. She has had a couple outpatient treamtents with PCP for pneumonia vs URI with family to improve. Interview conducted with help of russian video interpreter. She denies currently any cough, shortness of breath or hemoptysis. She is a lifelong never smoker. She grew up in [place]. She presented with worsening weakness and anorexia today. She had a CT Chest A/P which shows right lung mass with hilar adenopathy and diffuse osseous metastatic disease. PCCM consulted to assist with diagnosis of lung mass - concerning for primary lung malignancy.   Pertinent  Medical History  Breast Cancer s/p mastectomy and radiation HTN HLD   Significant Hospital Events: Including procedures, antibiotic start and stop dates in addition to other pertinent events     Interim History / Subjective:  NPO this am. Addressed questions and concerns regarding procedure with video translator.  Objective   Blood pressure (!) 174/40, pulse 74, temperature 98.2 F (36.8 C), temperature source Oral, resp. rate 17, height 5\' 5"  (1.651 m), weight 67.1 kg, SpO2 96 %.        Intake/Output Summary (Last 24 hours) at 05/07/2021 0847 Last data filed at 05/07/2021 0000 Gross per 24 hour  Intake 820 ml  Output --  Net 820 ml   Filed Weights   05/02/21 2325 05/04/21 0945 05/07/21 0815  Weight: 67.1 kg 67.1 kg 67.1 kg   Physical Exam: General: Chronically ill-appearing, no acute distress HENT: [place], AT, OP clear, MMM Eyes: EOMI, no scleral icterus Respiratory: Clear to auscultation bilaterally.  No crackles,  wheezing or rales Cardiovascular: RRR, -M/R/G, no JVD Extremities:-Edema,-tenderness Neuro: AAO x4, CNII-XII grossly intact Skin: Intact, no rashes or bruising Psych: Normal mood, normal affect  Hg 7>6.4>Transfusion>7.9  RUL mass measuring 2.7 x 3.8 x 2.8 cm with right hilar and mediastinal adenopathy with diffuse osseous metastatic disease Imaging, labs and test noted above have been reviewed independently by me.  Resolved Hospital Problem list     Assessment & Plan:   Right Lung mass concerning for primary lung malignancy History of breast cancer - malignant phyllodes tumor s/p mastectomy radiation Widespread osseous metastatic disease  Based on imaging, lung mass is highly concerning for malignancy.  We discussed bronchoscopy and addressed questions and concerns regarding procedure. We discussed risks and benefits of procedure including infection, bleeding and lung collapse. Patient consented to procedure.   --EBUS today  Rodman Pickle, M.D. [facility] Pulmonary/Critical Care Medicine 05/07/2021 8:54 AM   See Amion for personal pager For hours between 7 PM to 7 AM, please call Elink for urgent questions   Best Practice (right click and "Reselect all SmartList Selections" daily)  Per primary  Labs   CBC: Recent Labs  Lab 05/02/21 2343 05/03/21 2017 05/04/21 0629 05/05/21 1331 05/06/21 0442 05/07/21 0634  WBC 14.0*  --  11.0* 11.5* 11.5* 13.8*  NEUTROABS 11.6*  --   --   --   --   --   HGB 6.9* 6.9* 6.7* 7.0* 6.4* 7.9*  HCT 23.2* 23.1* 22.6* 24.2* 22.1* 26.4*  MCV 72.3*  --  72.2* 73.3* 73.2* 72.7*  PLT 679*  --  536* 582* 541* 518*    Basic Metabolic Panel: Recent Labs  Lab 05/02/21 2343 05/04/21 0629 05/05/21 1331 05/06/21 0256 05/07/21 0634  NA 128* 132* 134* 134* 135  K 3.4* 3.4* 3.7 4.1 3.2*  CL 94* 98 102 102 97*  CO2 22 24 23 22 27   GLUCOSE 151* 120* 138* 128* 136*  BUN 7* 7* 10 11 9   CREATININE 0.62 0.70 0.70 0.68 0.66  CALCIUM 8.9 8.4* 8.8*  8.3* 8.6*  MG 1.9  --   --   --   --    GFR: Estimated Creatinine Clearance: 53.8 mL/min (by C-G formula based on SCr of 0.66 mg/dL). Recent Labs  Lab 05/04/21 0629 05/05/21 1331 05/06/21 0442 05/07/21 0634  WBC 11.0* 11.5* 11.5* 13.8*    Liver Function Tests: Recent Labs  Lab 05/02/21 2343 05/06/21 0256 05/07/21 0634  AST 18 18 10*  ALT 16 9 10   ALKPHOS 103 104 102  BILITOT 0.5 QUANTITY NOT SUFFICIENT, UNABLE TO PERFORM TEST 0.7  PROT 6.7 5.6* 6.0*  ALBUMIN 3.1* 2.6* 2.7*   Recent Labs  Lab 05/02/21 2343 05/06/21 0442  LIPASE 30 24   No results for input(s): AMMONIA in the last 168 hours.  ABG No results found for: PHART, PCO2ART, PO2ART, HCO3, TCO2, ACIDBASEDEF, O2SAT   Coagulation Profile: No results for input(s): INR, PROTIME in the last 168 hours.  Cardiac Enzymes: No results for input(s): CKTOTAL, CKMB, CKMBINDEX, TROPONINI in the last 168 hours.  HbA1C: Hgb A1c MFr Bld  Date/Time Value Ref Range Status  08/30/2020 10:04 AM 6.4 4.6 - 6.5 % Final    Comment:    Glycemic Control Guidelines for People with Diabetes:Non Diabetic:  <6%Goal of Therapy: <7%Additional Action Suggested:  >8%   05/22/2017 08:26 AM 6.4 4.6 - 6.5 % Final    Comment:    Glycemic Control Guidelines for People with Diabetes:Non Diabetic:  <6%Goal of Therapy: <7%Additional Action Suggested:  >8%     CBG: No results for input(s): GLUCAP in the last 168 hours.  Review of Systems:   +weight loss, night sweats, decreased appetite +flank and back pain - dyspnea, hemoptysis - breast mass Otherwise 10 point ROS reviewed and negative  Past Medical History:  She,  has a past medical history of Breast mass, left, Cancer ([place]), Headache, Heart murmur, Hypertension, Mitral valve prolapse, and Tuberculosis.   Surgical History:   Past Surgical History:  Procedure Laterality Date   BIOPSY  05/04/2021   Procedure: BIOPSY;  Surgeon: Rush Landmark Telford Nab., MD;  Location: [place];   Service: Gastroenterology;;   ESOPHAGOGASTRODUODENOSCOPY N/A 05/04/2021   Procedure: ESOPHAGOGASTRODUODENOSCOPY (EGD);  Surgeon: Irving Copas., MD;  Location: [place];  Service: Gastroenterology;  Laterality: N/A;   MASS EXCISION Left 08/14/2017   Procedure: PARTIAL EXCISION  LEFT BREAST  MASS ERAS PATHWAY;  Surgeon: Fanny Skates, MD;  Location: [place];  Service: General;  Laterality: Left;   MASTECTOMY W/ SENTINEL NODE BIOPSY Left 09/14/2017   Procedure: LEFT TOTAL MASTECTOMY WITH SENTINEL LYMPH NODE BIOPSY;  Surgeon: Fanny Skates, MD;  Location: [place];  Service: General;  Laterality: Left;   NO PAST SURGERIES       Social History:   reports that she has never smoked. She has never used smokeless tobacco. She reports current alcohol use. She reports that she does not use drugs.   Family History:  Her family history includes Breast cancer in her sister; Stroke in her maternal grandmother and mother.   Allergies Allergies  Allergen Reactions   Hydrocodone-Acetaminophen Nausea Only   Statins Nausea And Vomiting and Other (See Comments)    MUSCLE PAIN   Amlodipine     fatigue   Clonidine Derivatives     headache   Losartan     abd pain   Sulfa Antibiotics Nausea Only   Sulfamethoxazole-Trimethoprim Nausea Only    " Severe Nausea "     Home Medications  Prior to Admission medications   Medication Sig Start Date End Date Taking? Authorizing Provider  acetaminophen (TYLENOL) 500 MG tablet Take 1,000 mg by mouth every 6 (six) hours as needed for headache.   Yes [provider]  aspirin 81 MG tablet Take 81 mg by mouth daily. 04/29/18  Yes Magrinat, Virgie Dad, MD  atenolol (TENORMIN) 100 MG tablet Take 1 tablet (100 mg total) by mouth 2 (two) times daily. 08/02/20  Yes Plotnikov, Evie Lacks, MD  cholecalciferol (VITAMIN D) 1000 units tablet Take 1 tablet (1,000 Units total) by mouth daily. 08/01/16  Yes Plotnikov, Evie Lacks, MD  triamterene-hydrochlorothiazide  (MAXZIDE-25) 37.5-25 MG tablet Take 1 tablet by mouth daily. 08/30/20  Yes Plotnikov, Evie Lacks, MD  vitamin C (ASCORBIC ACID) 500 MG tablet Take 1 tablet (500 mg total) by mouth every other day. 04/28/18  Yes Magrinat, Virgie Dad, MD  azithromycin (ZITHROMAX Z-PAK) 250 MG tablet As directed Patient not taking: Reported on 05/03/2021 03/11/21   Plotnikov, Evie Lacks, MD  cloNIDine (CATAPRES) 0.1 MG tablet Take 1 tablet (0.1 mg total) by mouth 3 (three) times daily as needed (if systolic WN>462). Patient not taking: Reported on 05/03/2021 11/29/20   Plotnikov, Evie Lacks, MD  cloNIDine (CATAPRES-TTS-1) 0.1 mg/24hr patch Place 1 patch (0.1 mg total) onto the skin once a week. Patient not taking: Reported on 05/03/2021 03/11/21   Plotnikov, Evie Lacks, MD  HYDROcodone bit-homatropine (HYCODAN) 5-1.5 MG/5ML syrup Take 5 mLs by mouth every 6 (six) hours as needed for cough. Patient not taking: Reported on 05/03/2021 03/11/21   Plotnikov, Evie Lacks, MD  MegaRed Omega-3 Krill Oil 500 MG CAPS Take 1 capsule by mouth every morning. Patient not taking: Reported on 05/03/2021 08/02/20   Plotnikov, Evie Lacks, MD

## 2021-05-07 NOTE — Anesthesia Procedure Notes (Signed)
Procedure Name: Intubation Date/Time: 05/07/2021 9:22 AM Performed by: Trinna Post., CRNA Pre-anesthesia Checklist: Patient identified, Emergency Drugs available, Suction available, Patient being monitored and Timeout performed Patient Re-evaluated:Patient Re-evaluated prior to induction Oxygen Delivery Method: Circle system utilized Preoxygenation: Pre-oxygenation with 100% oxygen Induction Type: IV induction Ventilation: Mask ventilation without difficulty Laryngoscope Size: Mac and 3 Grade View: Grade I Tube type: Oral Tube size: 8.5 mm Number of attempts: 1 Airway Equipment and Method: Stylet Placement Confirmation: ETT inserted through vocal cords under direct vision, positive ETCO2 and breath sounds checked- equal and bilateral Secured at: 22 cm Tube secured with: Tape Dental Injury: Teeth and Oropharynx as per pre-operative assessment

## 2021-05-07 NOTE — Anesthesia Postprocedure Evaluation (Signed)
Anesthesia Post Note  Patient: Sudie Grumbling  Procedure(s) Performed: VIDEO BRONCHOSCOPY WITH ENDOBRONCHIAL ULTRASOUND BRONCHIAL BRUSHINGS BRONCHIAL WASHINGS BRONCHIAL NEEDLE ASPIRATION BIOPSIES HEMOSTASIS CONTROL BRONCHIAL BIOPSIES     Patient location during evaluation: Endoscopy Anesthesia Type: General Level of consciousness: sedated Pain management: pain level controlled Vital Signs Assessment: post-procedure vital signs reviewed and stable Respiratory status: spontaneous breathing and respiratory function stable Cardiovascular status: stable Postop Assessment: no apparent nausea or vomiting Anesthetic complications: no   No notable events documented.  Last Vitals:  Vitals:   05/07/21 1055 05/07/21 1105  BP: (!) 163/47 (!) 148/34  Pulse: 71 72  Resp: (!) 27 (!) 27  Temp:    SpO2: 99% 95%    Last Pain:  Vitals:   05/07/21 1042  TempSrc: Temporal  PainSc:                  Angeletta Goelz DANIEL

## 2021-05-07 NOTE — Interval H&P Note (Signed)
History and Physical Interval Note:  05/07/2021 8:55 AM  Robin Estrada  has presented today for surgery, with the diagnosis of lung mass.  The various methods of treatment have been discussed with the patient and family. After consideration of risks, benefits and other options for treatment, the patient has consented to  Procedure(s): [place] (N/A) as a surgical intervention.  The patient's history has been reviewed, patient examined, no change in status, stable for surgery.  I have reviewed the patient's chart and labs.  Questions were answered to the patient's satisfaction.     Desmin Daleo Rodman Pickle, MD

## 2021-05-07 NOTE — Progress Notes (Signed)
PT Cancellation Note  Patient Details Name: Adiel Mcnamara MRN: [id number] DOB: [birth year]   Cancelled Treatment:    Reason Eval/Treat Not Completed: Patient at procedure or test/unavailable. Pt leaving floor for procedure upon therapist's arrival. PT to re-attempt as time allows.   Lorriane Shire 05/07/2021, 8:06 AM  Lorrin Goodell, PT  Office # [phone] Pager [phone]

## 2021-05-07 NOTE — H&P (View-Only) (Signed)
[name], MRN:  [id number], DOB:  1945/10/29, LOS: 4 ADMISSION DATE:  05/02/2021, CONSULTATION DATE:  05/07/2021 REFERRING MD:  Robin Brod, MD, CHIEF COMPLAINT:  lung mass   History of Present Illness:  This is a 75 yo woman with past medical history of breast cancer s/p mastectomy and radiation in 2017 who presents with several months of decreased appetite, weight loss, nausea, not feeling well. She has had a couple outpatient treamtents with PCP for pneumonia vs URI with family to improve. Interview conducted with help of russian video interpreter. She denies currently any cough, shortness of breath or hemoptysis. She is a lifelong never smoker. She grew up in [place]. She presented with worsening weakness and anorexia today. She had a CT Chest A/P which shows right lung mass with hilar adenopathy and diffuse osseous metastatic disease. PCCM consulted to assist with diagnosis of lung mass - concerning for primary lung malignancy.   Pertinent  Medical History  Breast Cancer s/p mastectomy and radiation HTN HLD   Significant Hospital Events: Including procedures, antibiotic start and stop dates in addition to other pertinent events     Interim History / Subjective:  NPO this am. Addressed questions and concerns regarding procedure with video translator.  Objective   Blood pressure (!) 174/40, pulse 74, temperature 98.2 F (36.8 C), temperature source Oral, resp. rate 17, height 5\' 5"  (1.651 m), weight 67.1 kg, SpO2 96 %.        Intake/Output Summary (Last 24 hours) at 05/07/2021 0847 Last data filed at 05/07/2021 0000 Gross per 24 hour  Intake 820 ml  Output --  Net 820 ml   Filed Weights   05/02/21 2325 05/04/21 0945 05/07/21 0815  Weight: 67.1 kg 67.1 kg 67.1 kg   Physical Exam: General: Chronically ill-appearing, no acute distress HENT: [place], AT, OP clear, MMM Eyes: EOMI, no scleral icterus Respiratory: Clear to auscultation bilaterally.  No crackles,  wheezing or rales Cardiovascular: RRR, -M/R/G, no JVD Extremities:-Edema,-tenderness Neuro: AAO x4, CNII-XII grossly intact Skin: Intact, no rashes or bruising Psych: Normal mood, normal affect  Hg 7>6.4>Transfusion>7.9  RUL mass measuring 2.7 x 3.8 x 2.8 cm with right hilar and mediastinal adenopathy with diffuse osseous metastatic disease Imaging, labs and test noted above have been reviewed independently by me.  Resolved Hospital Problem list     Assessment & Plan:   Right Lung mass concerning for primary lung malignancy History of breast cancer - malignant phyllodes tumor s/p mastectomy radiation Widespread osseous metastatic disease  Based on imaging, lung mass is highly concerning for malignancy.  We discussed bronchoscopy and addressed questions and concerns regarding procedure. We discussed risks and benefits of procedure including infection, bleeding and lung collapse. Patient consented to procedure.   --EBUS today  Rodman Pickle, M.D. [facility] Pulmonary/Critical Care Medicine 05/07/2021 8:54 AM   See Amion for personal pager For hours between 7 PM to 7 AM, please call Elink for urgent questions   Best Practice (right click and "Reselect all SmartList Selections" daily)  Per primary  Labs   CBC: Recent Labs  Lab 05/02/21 2343 05/03/21 2017 05/04/21 0629 05/05/21 1331 05/06/21 0442 05/07/21 0634  WBC 14.0*  --  11.0* 11.5* 11.5* 13.8*  NEUTROABS 11.6*  --   --   --   --   --   HGB 6.9* 6.9* 6.7* 7.0* 6.4* 7.9*  HCT 23.2* 23.1* 22.6* 24.2* 22.1* 26.4*  MCV 72.3*  --  72.2* 73.3* 73.2* 72.7*  PLT 679*  --  536* 582* 541* 518*    Basic Metabolic Panel: Recent Labs  Lab 05/02/21 2343 05/04/21 0629 05/05/21 1331 05/06/21 0256 05/07/21 0634  NA 128* 132* 134* 134* 135  K 3.4* 3.4* 3.7 4.1 3.2*  CL 94* 98 102 102 97*  CO2 22 24 23 22 27   GLUCOSE 151* 120* 138* 128* 136*  BUN 7* 7* 10 11 9   CREATININE 0.62 0.70 0.70 0.68 0.66  CALCIUM 8.9 8.4* 8.8*  8.3* 8.6*  MG 1.9  --   --   --   --    GFR: Estimated Creatinine Clearance: 53.8 mL/min (by C-G formula based on SCr of 0.66 mg/dL). Recent Labs  Lab 05/04/21 0629 05/05/21 1331 05/06/21 0442 05/07/21 0634  WBC 11.0* 11.5* 11.5* 13.8*    Liver Function Tests: Recent Labs  Lab 05/02/21 2343 05/06/21 0256 05/07/21 0634  AST 18 18 10*  ALT 16 9 10   ALKPHOS 103 104 102  BILITOT 0.5 QUANTITY NOT SUFFICIENT, UNABLE TO PERFORM TEST 0.7  PROT 6.7 5.6* 6.0*  ALBUMIN 3.1* 2.6* 2.7*   Recent Labs  Lab 05/02/21 2343 05/06/21 0442  LIPASE 30 24   No results for input(s): AMMONIA in the last 168 hours.  ABG No results found for: PHART, PCO2ART, PO2ART, HCO3, TCO2, ACIDBASEDEF, O2SAT   Coagulation Profile: No results for input(s): INR, PROTIME in the last 168 hours.  Cardiac Enzymes: No results for input(s): CKTOTAL, CKMB, CKMBINDEX, TROPONINI in the last 168 hours.  HbA1C: Hgb A1c MFr Bld  Date/Time Value Ref Range Status  08/30/2020 10:04 AM 6.4 4.6 - 6.5 % Final    Comment:    Glycemic Control Guidelines for People with Diabetes:Non Diabetic:  <6%Goal of Therapy: <7%Additional Action Suggested:  >8%   05/22/2017 08:26 AM 6.4 4.6 - 6.5 % Final    Comment:    Glycemic Control Guidelines for People with Diabetes:Non Diabetic:  <6%Goal of Therapy: <7%Additional Action Suggested:  >8%     CBG: No results for input(s): GLUCAP in the last 168 hours.  Review of Systems:   +weight loss, night sweats, decreased appetite +flank and back pain - dyspnea, hemoptysis - breast mass Otherwise 10 point ROS reviewed and negative  Past Medical History:  She,  has a past medical history of Breast mass, left, Cancer ([place]), Headache, Heart murmur, Hypertension, Mitral valve prolapse, and Tuberculosis.   Surgical History:   Past Surgical History:  Procedure Laterality Date   BIOPSY  05/04/2021   Procedure: BIOPSY;  Surgeon: Rush Landmark Telford Nab., MD;  Location: [place];   Service: Gastroenterology;;   ESOPHAGOGASTRODUODENOSCOPY N/A 05/04/2021   Procedure: ESOPHAGOGASTRODUODENOSCOPY (EGD);  Surgeon: Irving Copas., MD;  Location: [place];  Service: Gastroenterology;  Laterality: N/A;   MASS EXCISION Left 08/14/2017   Procedure: PARTIAL EXCISION  LEFT BREAST  MASS ERAS PATHWAY;  Surgeon: Fanny Skates, MD;  Location: [place];  Service: General;  Laterality: Left;   MASTECTOMY W/ SENTINEL NODE BIOPSY Left 09/14/2017   Procedure: LEFT TOTAL MASTECTOMY WITH SENTINEL LYMPH NODE BIOPSY;  Surgeon: Fanny Skates, MD;  Location: [place];  Service: General;  Laterality: Left;   NO PAST SURGERIES       Social History:   reports that she has never smoked. She has never used smokeless tobacco. She reports current alcohol use. She reports that she does not use drugs.   Family History:  Her family history includes Breast cancer in her sister; Stroke in her maternal grandmother and mother.   Allergies Allergies  Allergen Reactions   Hydrocodone-Acetaminophen Nausea Only   Statins Nausea And Vomiting and Other (See Comments)    MUSCLE PAIN   Amlodipine     fatigue   Clonidine Derivatives     headache   Losartan     abd pain   Sulfa Antibiotics Nausea Only   Sulfamethoxazole-Trimethoprim Nausea Only    " Severe Nausea "     Home Medications  Prior to Admission medications   Medication Sig Start Date End Date Taking? Authorizing Provider  acetaminophen (TYLENOL) 500 MG tablet Take 1,000 mg by mouth every 6 (six) hours as needed for headache.   Yes [provider]  aspirin 81 MG tablet Take 81 mg by mouth daily. 04/29/18  Yes Magrinat, Virgie Dad, MD  atenolol (TENORMIN) 100 MG tablet Take 1 tablet (100 mg total) by mouth 2 (two) times daily. 08/02/20  Yes Plotnikov, Evie Lacks, MD  cholecalciferol (VITAMIN D) 1000 units tablet Take 1 tablet (1,000 Units total) by mouth daily. 08/01/16  Yes Plotnikov, Evie Lacks, MD  triamterene-hydrochlorothiazide  (MAXZIDE-25) 37.5-25 MG tablet Take 1 tablet by mouth daily. 08/30/20  Yes Plotnikov, Evie Lacks, MD  vitamin C (ASCORBIC ACID) 500 MG tablet Take 1 tablet (500 mg total) by mouth every other day. 04/28/18  Yes Magrinat, Virgie Dad, MD  azithromycin (ZITHROMAX Z-PAK) 250 MG tablet As directed Patient not taking: Reported on 05/03/2021 03/11/21   Plotnikov, Evie Lacks, MD  cloNIDine (CATAPRES) 0.1 MG tablet Take 1 tablet (0.1 mg total) by mouth 3 (three) times daily as needed (if systolic VX>480). Patient not taking: Reported on 05/03/2021 11/29/20   Plotnikov, Evie Lacks, MD  cloNIDine (CATAPRES-TTS-1) 0.1 mg/24hr patch Place 1 patch (0.1 mg total) onto the skin once a week. Patient not taking: Reported on 05/03/2021 03/11/21   Plotnikov, Evie Lacks, MD  HYDROcodone bit-homatropine (HYCODAN) 5-1.5 MG/5ML syrup Take 5 mLs by mouth every 6 (six) hours as needed for cough. Patient not taking: Reported on 05/03/2021 03/11/21   Plotnikov, Evie Lacks, MD  MegaRed Omega-3 Krill Oil 500 MG CAPS Take 1 capsule by mouth every morning. Patient not taking: Reported on 05/03/2021 08/02/20   Plotnikov, Evie Lacks, MD

## 2021-05-08 ENCOUNTER — Inpatient Hospital Stay (HOSPITAL_COMMUNITY): Payer: Medicare HMO

## 2021-05-08 ENCOUNTER — Encounter: Payer: Self-pay | Admitting: Gastroenterology

## 2021-05-08 ENCOUNTER — Encounter (HOSPITAL_COMMUNITY): Payer: Self-pay | Admitting: Pulmonary Disease

## 2021-05-08 DIAGNOSIS — D62 Acute posthemorrhagic anemia: Secondary | ICD-10-CM | POA: Diagnosis not present

## 2021-05-08 DIAGNOSIS — D509 Iron deficiency anemia, unspecified: Secondary | ICD-10-CM | POA: Diagnosis not present

## 2021-05-08 DIAGNOSIS — K869 Disease of pancreas, unspecified: Secondary | ICD-10-CM | POA: Diagnosis not present

## 2021-05-08 DIAGNOSIS — K862 Cyst of pancreas: Secondary | ICD-10-CM | POA: Diagnosis not present

## 2021-05-08 DIAGNOSIS — I10 Essential (primary) hypertension: Secondary | ICD-10-CM | POA: Diagnosis not present

## 2021-05-08 DIAGNOSIS — R062 Wheezing: Secondary | ICD-10-CM | POA: Insufficient documentation

## 2021-05-08 DIAGNOSIS — K253 Acute gastric ulcer without hemorrhage or perforation: Secondary | ICD-10-CM | POA: Diagnosis not present

## 2021-05-08 DIAGNOSIS — K922 Gastrointestinal hemorrhage, unspecified: Secondary | ICD-10-CM | POA: Diagnosis not present

## 2021-05-08 LAB — COMPREHENSIVE METABOLIC PANEL
ALT: 13 U/L (ref 0–44)
AST: 19 U/L (ref 15–41)
Albumin: 2.5 g/dL — ABNORMAL LOW (ref 3.5–5.0)
Alkaline Phosphatase: 99 U/L (ref 38–126)
Anion gap: 13 (ref 5–15)
BUN: 10 mg/dL (ref 8–23)
CO2: 26 mmol/L (ref 22–32)
Calcium: 8.2 mg/dL — ABNORMAL LOW (ref 8.9–10.3)
Chloride: 96 mmol/L — ABNORMAL LOW (ref 98–111)
Creatinine, Ser: 0.86 mg/dL (ref 0.44–1.00)
GFR, Estimated: >60 mL/min (ref >60)
Glucose, Bld: 176 mg/dL — ABNORMAL HIGH (ref 70–99)
Potassium: 3.5 mmol/L (ref 3.5–5.1)
Sodium: 135 mmol/L (ref 135–145)
Total Bilirubin: 0.6 mg/dL (ref 0.3–1.2)
Total Protein: 5.7 g/dL — ABNORMAL LOW (ref 6.5–8.1)

## 2021-05-08 LAB — CBC
HCT: 26.4 % — ABNORMAL LOW (ref 36.0–46.0)
Hemoglobin: 7.9 g/dL — ABNORMAL LOW (ref 12.0–15.0)
MCH: 22.3 pg — ABNORMAL LOW (ref 26.0–34.0)
MCHC: 29.9 g/dL — ABNORMAL LOW (ref 30.0–36.0)
MCV: 74.4 fL — ABNORMAL LOW (ref 80.0–100.0)
Platelets: 488 10*3/uL — ABNORMAL HIGH (ref 150–400)
RBC: 3.55 MIL/uL — ABNORMAL LOW (ref 3.87–5.11)
RDW: 20.3 % — ABNORMAL HIGH (ref 11.5–15.5)
WBC: 13.5 10*3/uL — ABNORMAL HIGH (ref 4.0–10.5)
nRBC: 0 % (ref 0.0–0.2)

## 2021-05-08 LAB — CYTOLOGY - NON PAP

## 2021-05-08 LAB — SURGICAL PATHOLOGY

## 2021-05-08 MED ORDER — ALBUTEROL SULFATE (2.5 MG/3ML) 0.083% IN NEBU
2.5000 mg | INHALATION_SOLUTION | RESPIRATORY_TRACT | Status: AC
  Administered 2021-05-08: 14:00:00 2.5 mg via RESPIRATORY_TRACT
  Filled 2021-05-08: qty 3

## 2021-05-08 MED ORDER — FENTANYL 12 MCG/HR TD PT72
1.0000 | MEDICATED_PATCH | TRANSDERMAL | Status: DC
  Administered 2021-05-08 – 2021-05-23 (×6): 1 via TRANSDERMAL
  Filled 2021-05-08 (×6): qty 1

## 2021-05-08 NOTE — Progress Notes (Signed)
[facility] Progress Note  Patient: Robin Estrada    URK:[id number]  DOA: 05/02/2021    Date of Service: the patient was seen and examined on 05/08/2021  Brief hospital course: 75 year old [place] female with history of breast cancer and hypertension admitted on 12/22 with complaints of anorexia and weight loss x3 months and found to have hemoglobin at 6.9 with Hemoccult positive.  CT of abdomen pelvis with contrast notes a cystic lesion of tail of pancreas with infiltrative disease of the liver and chest x-ray notes right upper lobe mass.  Admitted to hospital service.  GI consulted and patient underwent EGD on 12/24, noting gastritis and ulcer, not consistent with level of hemoglobin.  Palliative care consulted and patient is understanding of her overall condition and likely diagnosis and prognosis.  For now, she wishes to be a full code.  Initially refused blood transfusion, but after discussion with son, amenable.  Patient received 1 unit packed red blood cells on the night of 12/26.  Also has received IV iron.  Underwent bronchoscopy on the morning of 12/27 with multiple areas biopsied and pathology pending.  Plan for colonoscopy on 12/28.   Assessment and Plan: Cardiovascular and Mediastinum Acute on chronic diastolic CHF (congestive heart failure) (HCC) Assessment & Plan Noted on echocardiogram from 3 years ago.  Given hypertensive urgency issues, suspect that blood transfusion has led to increased volume and volume overload.  BNP at 404.  Have started gentle IV Lasix  Essential hypertension Assessment & Plan Blood pressure slightly elevated secondary to volume overload.  BNP of 404.  Gentle IV Lasix..  Continue metoprolol.  Also on as needed clonidine and once a week Maxide.   Digestive Cystic mass of pancreas Assessment & Plan CA 19-9 level normal.  Her back pain could be from thoracic compression fracture or from pancreatitis.  Checking lipase as well  * GI  bleed Assessment & Plan Unclear etiology.  Findings on EGD not proportionate for blood loss.  Colonoscopy possibly tomorrow depending on when bronchoscopy is.  Otherwise on Wednesday.  Musculoskeletal and Integument Pathologic compression fracture of thoracic vertebra [facility]) Assessment & Plan As needed hydrocodone.  PT recommends outpatient PT  Hematopoietic and Hemostatic Thrombocytosis Assessment & Plan Likely reactive  Other Leukocytosis Assessment & Plan Suspect stress margination.  No antibiotics and white blood cell count down to 11 on the following day.  Slightly elevated today.  Hypokalemia Assessment & Plan Replace as needed  Hypoalbuminemia Assessment & Plan Decreased p.o. intake likely in the setting of malignancy.  Nutrition to see  Lung mass Assessment & Plan Possible primary versus metastatic disease.  For bronchoscopy on Tuesday  Acute blood loss anemia Assessment & Plan Patient initially was refusing blood transfusion, only excepting IV iron.  After discussion with son, now amenable.  Underwent transfusion of 1 unit on 12/26.  Continue to monitor hemoglobin.     Body mass index is 24.62 kg/m.  Nutrition Problem: Inadequate oral intake Etiology: poor appetite     Consultants: Gastroenterology Critical care/pulmonary Palliative care  Procedures: Planned colonoscopy 12/28 Status post bronchoscopy 12/27 with multiple biopsies.  Results are pending. Status post EGD  Antimicrobials: None  Code Status: Full code   Subjective: Patient doing okay.  Complains of continued low back pain although manageable with IV morphine.  Complains of a mild cough since her bronchoscopy.  Objective: Vitals:   05/08/21 0607 05/08/21 0756  BP: (!) 136/54 125/69  Pulse: 76 71  Resp:  14  Temp:  98.4  F (36.9 C)  SpO2:  97%    Intake/Output Summary (Last 24 hours) at 05/08/2021 1446 Last data filed at 05/08/2021 1143 Gross per 24 hour  Intake 3 ml   Output --  Net 3 ml    Filed Weights   05/02/21 2325 05/04/21 0945 05/07/21 0815  Weight: 67.1 kg 67.1 kg 67.1 kg   Body mass index is 24.62 kg/m.  Exam:  General: Alert and oriented x3, no acute distress, fatigued HEENT: Normocephalic atraumatic, mucous membranes are moist Cardiovascular: Regular rate and rhythm, S1-S2 Respiratory: Bilateral expiratory wheeze Abdomen: Soft, nontender, nondistended, positive bowel sounds Musculoskeletal: No clubbing or cyanosis or edema Skin: No skin break, tears or lesions Psychiatry: Appropriate, no evidence of psychoses Neurology: No focal deficits  Data Reviewed: Today's labs reviewed.  Hemoglobin stable at 7.9.  Disposition:  Status is: Inpatient  Remains inpatient appropriate because: Colonoscopy for 12/29 and awaiting pathology from bronchoscopy   Family Communication: Updated son by phone.  DVT Prophylaxis: SCDs Start: 05/03/21 2787    Author: Annita Brod ,MD 05/08/2021 2:46 PM  To reach On-call, see care teams to locate the attending and reach out via [email or web address]. Between 7PM-7AM, please contact night-coverage If you still have difficulty reaching the attending provider, please page the [facility] (Director on Call) for [facility] on amion for assistance.

## 2021-05-08 NOTE — H&P (View-Only) (Signed)
Progress Note   Subjective  Chief Complaint:   iron def anemia.  Gastic ulcer.  Lung/hilar/mediastinal masses.  Lytic spine lesions/back pain   Back pain improved with pain medications. Still continues with nonproductive cough after bronchoscopy, just completed breathing treatment, has fatigue. Patient states she has not had a bowel movement for 2 to 3 days, however discussing with nurse possible bowel movement last night. Has some bilateral lower abdominal/flank pain worse after eating.  Communicating use of video-based interpreter Beverlee Nims [phone]    Objective   Vital signs in last 24 hours: Temp:  [98.3 F (36.8 C)-98.6 F (37 C)] 98.4 F (36.9 C) (12/28 0756) Pulse Rate:  [71-79] 71 (12/28 0756) Resp:  [14-22] 14 (12/28 0756) BP: (123-136)/(45-69) 125/69 (12/28 0756) SpO2:  [92 %-97 %] 97 % (12/28 0756) Last BM Date: 05/02/21  General:   female appears pale, tired but no acute distress Heart:  regular rate and rhythm, no murmurs or gallops Pulm: Clear anteriorly; no wheezing, coughing periodically nonproductive Abdomen:  Soft AB,  Normal bowel sounds. mild tenderness in the lower abdomen. Without guarding and Without rebound Neurologic:  Alert and  oriented x4;  grossly normal neurologically.  Psychiatric: Cooperative. Normal mood and flat affect.  Intake/Output from previous day: 12/27 0701 - 12/28 0700 In: 600 [I.V.:600] Out: 0  Intake/Output this shift: Total I/O In: 3 [I.V.:3] Out: -   Lab Results: Recent Labs    05/06/21 0442 05/07/21 0634 05/08/21 0605  WBC 11.5* 13.8* 13.5*  HGB 6.4* 7.9* 7.9*  HCT 22.1* 26.4* 26.4*  PLT 541* 518* 488*   BMET Recent Labs    05/06/21 0256 05/07/21 0634 05/08/21 0605  NA 134* 135 135  K 4.1 3.2* 3.5  CL 102 97* 96*  CO2 22 27 26   GLUCOSE 128* 136* 176*  BUN 11 9 10   CREATININE 0.68 0.66 0.86  CALCIUM 8.3* 8.6* 8.2*   LFT Recent Labs    05/08/21 0605  PROT 5.7*  ALBUMIN 2.5*  AST 19  ALT 13  ALKPHOS  99  BILITOT 0.6   PT/INR No results for input(s): LABPROT, INR in the last 72 hours.  Studies/Results: DG CHEST PORT 1 VIEW  Result Date: 05/08/2021 CLINICAL DATA:  Bronchoscopy yesterday, cough EXAM: PORTABLE CHEST 1 VIEW COMPARISON:  Chest radiograph 1 day prior, CT chest 05/03/2021 FINDINGS: The cardiomediastinal silhouette is stable. The approximally 4.6 cm mass in the right upper lobe is unchanged. Opacities surrounding this mass seen on the prior radiograph are decreased in conspicuity. There is no new or worsening focal airspace disease. There is no pleural effusion. There is no pneumothorax. The bones are stable. IMPRESSION: Interval decreased in conspicuity of patchy opacities surrounding the right upper lobe mass compared to the study from 1 day prior. No new or worsening focal airspace disease. No pneumothorax. Electronically Signed   By: Valetta Mole M.D.   On: 05/08/2021 08:45   DG CHEST PORT 1 VIEW  Result Date: 05/07/2021 CLINICAL DATA:  Status post bronchoscopy EXAM: PORTABLE CHEST 1 VIEW COMPARISON:  05/02/2021 FINDINGS: New patchy alveolar densities seen in the right upper lung fields. Previously seen 4 cm nodular density in the right upper lung fields is obscured by the new infiltrate. Findings suggest possible pulmonary hemorrhage related to bronchoscopic biopsy. Rest of the lung fields are unremarkable. There is no pleural effusion or pneumothorax. IMPRESSION: New alveolar infiltrate is seen in the right upper lung fields possibly suggesting pulmonary hemorrhage related to bronchoscopic biopsy. Electronically  Signed   By: Elmer Picker M.D.   On: 05/07/2021 12:00       Impression     RUL lung mass. R Hilar and mediastinal mass.    Bronch, with lavage and brushings, ultrasound w FNA 12/27.   Await cytology   IDA, nausea vomiting. EGD 12/24: 4 cm HH.  Clean-based, non-bleeding GU, erosive gastritis. Biopsy showed no significant abnormality of small bowel, negative  H pylori, no inflammation/dysplasia or malignancy.  1 PRBC 05/07/21  Ferrlecit infusion on 12/24.  Protonix 40 mg po bid Plan for movi prep this evening, n.p.o. in AM, for colonoscopy tomorrow around 1145.  Discussed risk and benefits with patient, all questions answered and she wishes to proceed. Last bowel movement 2 to 3 days ago per patient however per nurse did have bowel movement last night, patient has been on narcotics, had Dulcolax tablet yesterday evening, can consider repeat Dulcolax tablet or suppository in hopes patient be cleaned out enough for colonoscopy tomorrow.  Back pain.  Lytic lesions in spine, pelvis.     Indeterminate 1.8 cm cystic lesion in pancreatic tail.   Dr Rush Landmark suggested MRCP/MRI at some point, this is not urgent, can evaluate after right upper lung mass has been evaluated and after colonoscopy.    LOS: 5 days   Robin Estrada  05/08/2021, 2:59 PM     Attending Physician Note   I have taken an interval history, reviewed the chart and examined the patient. I personally saw the patient and performed a substantive portion of this encounter, including a complete performance of at least one of the key components, in conjunction with the APP. I agree with the APP's note, impression and recommendations.   *IDA, for colonoscopy tomorrow. EGD showed a 4 cm HH, GU and erosive gastritis. Biopsies of duodenum and stomach were normal.   *RUL mass, post bronchoscopy. Awaiting final cytology.   *Pancreatic tail cystic lesion. Consider MRI/MRCP after RUL mass evaluation and colonoscopy results.    Lucio Edward, MD [facility] See AMION, [facility], for our on call provider

## 2021-05-08 NOTE — Progress Notes (Addendum)
Progress Note   Subjective  Chief Complaint:   iron def anemia.  Gastic ulcer.  Lung/hilar/mediastinal masses.  Lytic spine lesions/back pain   Back pain improved with pain medications. Still continues with nonproductive cough after bronchoscopy, just completed breathing treatment, has fatigue. Patient states she has not had a bowel movement for 2 to 3 days, however discussing with nurse possible bowel movement last night. Has some bilateral lower abdominal/flank pain worse after eating.  Communicating use of video-based interpreter Beverlee Nims [phone]    Objective   Vital signs in last 24 hours: Temp:  [98.3 F (36.8 C)-98.6 F (37 C)] 98.4 F (36.9 C) (12/28 0756) Pulse Rate:  [71-79] 71 (12/28 0756) Resp:  [14-22] 14 (12/28 0756) BP: (123-136)/(45-69) 125/69 (12/28 0756) SpO2:  [92 %-97 %] 97 % (12/28 0756) Last BM Date: 05/02/21  General:   female appears pale, tired but no acute distress Heart:  regular rate and rhythm, no murmurs or gallops Pulm: Clear anteriorly; no wheezing, coughing periodically nonproductive Abdomen:  Soft AB,  Normal bowel sounds. mild tenderness in the lower abdomen. Without guarding and Without rebound Neurologic:  Alert and  oriented x4;  grossly normal neurologically.  Psychiatric: Cooperative. Normal mood and flat affect.  Intake/Output from previous day: 12/27 0701 - 12/28 0700 In: 600 [I.V.:600] Out: 0  Intake/Output this shift: Total I/O In: 3 [I.V.:3] Out: -   Lab Results: Recent Labs    05/06/21 0442 05/07/21 0634 05/08/21 0605  WBC 11.5* 13.8* 13.5*  HGB 6.4* 7.9* 7.9*  HCT 22.1* 26.4* 26.4*  PLT 541* 518* 488*   BMET Recent Labs    05/06/21 0256 05/07/21 0634 05/08/21 0605  NA 134* 135 135  K 4.1 3.2* 3.5  CL 102 97* 96*  CO2 22 27 26   GLUCOSE 128* 136* 176*  BUN 11 9 10   CREATININE 0.68 0.66 0.86  CALCIUM 8.3* 8.6* 8.2*   LFT Recent Labs    05/08/21 0605  PROT 5.7*  ALBUMIN 2.5*  AST 19  ALT 13  ALKPHOS  99  BILITOT 0.6   PT/INR No results for input(s): LABPROT, INR in the last 72 hours.  Studies/Results: DG CHEST PORT 1 VIEW  Result Date: 05/08/2021 CLINICAL DATA:  Bronchoscopy yesterday, cough EXAM: PORTABLE CHEST 1 VIEW COMPARISON:  Chest radiograph 1 day prior, CT chest 05/03/2021 FINDINGS: The cardiomediastinal silhouette is stable. The approximally 4.6 cm mass in the right upper lobe is unchanged. Opacities surrounding this mass seen on the prior radiograph are decreased in conspicuity. There is no new or worsening focal airspace disease. There is no pleural effusion. There is no pneumothorax. The bones are stable. IMPRESSION: Interval decreased in conspicuity of patchy opacities surrounding the right upper lobe mass compared to the study from 1 day prior. No new or worsening focal airspace disease. No pneumothorax. Electronically Signed   By: Valetta Mole M.D.   On: 05/08/2021 08:45   DG CHEST PORT 1 VIEW  Result Date: 05/07/2021 CLINICAL DATA:  Status post bronchoscopy EXAM: PORTABLE CHEST 1 VIEW COMPARISON:  05/02/2021 FINDINGS: New patchy alveolar densities seen in the right upper lung fields. Previously seen 4 cm nodular density in the right upper lung fields is obscured by the new infiltrate. Findings suggest possible pulmonary hemorrhage related to bronchoscopic biopsy. Rest of the lung fields are unremarkable. There is no pleural effusion or pneumothorax. IMPRESSION: New alveolar infiltrate is seen in the right upper lung fields possibly suggesting pulmonary hemorrhage related to bronchoscopic biopsy. Electronically  Signed   By: Elmer Picker M.D.   On: 05/07/2021 12:00       Impression     RUL lung mass. R Hilar and mediastinal mass.    Bronch, with lavage and brushings, ultrasound w FNA 12/27.   Await cytology   IDA, nausea vomiting. EGD 12/24: 4 cm HH.  Clean-based, non-bleeding GU, erosive gastritis. Biopsy showed no significant abnormality of small bowel, negative  H pylori, no inflammation/dysplasia or malignancy.  1 PRBC 05/07/21  Ferrlecit infusion on 12/24.  Protonix 40 mg po bid Plan for movi prep this evening, n.p.o. in AM, for colonoscopy tomorrow around 1145.  Discussed risk and benefits with patient, all questions answered and she wishes to proceed. Last bowel movement 2 to 3 days ago per patient however per nurse did have bowel movement last night, patient has been on narcotics, had Dulcolax tablet yesterday evening, can consider repeat Dulcolax tablet or suppository in hopes patient be cleaned out enough for colonoscopy tomorrow.  Back pain.  Lytic lesions in spine, pelvis.     Indeterminate 1.8 cm cystic lesion in pancreatic tail.   Dr Rush Landmark suggested MRCP/MRI at some point, this is not urgent, can evaluate after right upper lung mass has been evaluated and after colonoscopy.    LOS: 5 days   Robin Estrada  05/08/2021, 2:59 PM     Attending Physician Note   I have taken an interval history, reviewed the chart and examined the patient. I personally saw the patient and performed a substantive portion of this encounter, including a complete performance of at least one of the key components, in conjunction with the APP. I agree with the APP's note, impression and recommendations.   *IDA, for colonoscopy tomorrow. EGD showed a 4 cm HH, GU and erosive gastritis. Biopsies of duodenum and stomach were normal.   *RUL mass, post bronchoscopy. Awaiting final cytology.   *Pancreatic tail cystic lesion. Consider MRI/MRCP after RUL mass evaluation and colonoscopy results.    Lucio Edward, MD [facility] See AMION, [facility], for our on call provider

## 2021-05-08 NOTE — Assessment & Plan Note (Addendum)
Continued wheezing secondary to volume overload.  Have increased Lasix.

## 2021-05-08 NOTE — Progress Notes (Signed)
PT Cancellation Note  Patient Details Name: Robin Estrada MRN: [id number] DOB: 06-25-1945   Cancelled Treatment:    Reason Eval/Treat Not Completed: Other (comment). Pt reports she did everything for OT, via interpreter.  Declined PT, wants to just rest.  Beginning colonoscopy prep later and will try as time and pt allow.   Ramond Dial 05/08/2021, 12:02 PM  Mee Hives, PT PhD Acute Rehab Dept. Number: [place]

## 2021-05-08 NOTE — Care Management Important Message (Signed)
Important Message  Patient Details  Name: Robin Estrada MRN: [id number] Date of Birth: 05-Jan-1946   Medicare Important Message Given:  Other (see comment)     Hannah Beat 05/08/2021, 12:46 PM

## 2021-05-08 NOTE — Evaluation (Signed)
Occupational Therapy Evaluation Patient Details Name: Robin Estrada MRN: [id number] DOB: 01/01/1946 Today's Date: 05/08/2021   History of Present Illness 75 y.o. female presents to [facility] hospital on 05/02/2021 with complaints of weakness, DOE. 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   Clinical Impression   Pt reports independence with ADLs and functional mobility, lives with family, however reports family works and she is alone ~12 hrs/day. Currently, pt supervision-min guard for ADLs, mod I for bed mobility,and supervision for transfers. Pt reported mild dizziness with sit to stand transition, however resolved quickly, reported increased dizziness after walking to and from bathroom, BP taken once supine 158/55, RN aware. Pt currently limited by problem list below, will continue to follow acutely. Recommend HHOT pending pt progress.      Recommendations for follow up therapy are one component of a multi-disciplinary discharge planning process, led by the attending physician.  Recommendations may be updated based on patient status, additional functional criteria and insurance authorization.   Follow Up Recommendations  Home health OT    Assistance Recommended at Discharge Set up Supervision/Assistance  Functional Status Assessment  Patient has had a recent decline in their functional status and demonstrates the ability to make significant improvements in function in a reasonable and predictable amount of time.  Equipment Recommendations  Tub/shower seat    Recommendations for Other Services PT consult     Precautions / Restrictions Precautions Precautions: Fall Precaution Comments: anemic Restrictions Weight Bearing Restrictions: No      Mobility  Bed Mobility Overal bed mobility: Modified Independent                  Transfers Overall transfer level: Needs assistance   Transfers: Sit to/from Stand Sit to Stand: Supervision                  Balance Overall balance assessment: Mild deficits observed, not formally tested                                         ADL either performed or assessed with clinical judgement   ADL Overall ADL's : Needs assistance/impaired Eating/Feeding: Set up;Sitting   Grooming: Wash/dry hands;Standing;Min guard   Upper Body Bathing: Min guard;Sitting   Lower Body Bathing: Moderate assistance;Sitting/lateral leans   Upper Body Dressing : Min guard;Sitting   Lower Body Dressing: Min guard;Sitting/lateral leans   Toilet Transfer: Min guard;Ambulation;Regular Toilet   Toileting- Water quality scientist and Hygiene: Supervision/safety;Sit to/from stand       Functional mobility during ADLs: Min guard       Vision   Vision Assessment?: No apparent visual deficits     Perception     Praxis      Pertinent Vitals/Pain Pain Assessment: No/denies pain     Hand Dominance     Extremity/Trunk Assessment Upper Extremity Assessment Upper Extremity Assessment: Overall WFL for tasks assessed   Lower Extremity Assessment Lower Extremity Assessment: Defer to PT evaluation   Cervical / Trunk Assessment Cervical / Trunk Assessment: Normal   Communication Communication Communication: Prefers language other than [place];Interpreter utilized (used [employer] interpreter Sam [phone])   Cognition Arousal/Alertness: Awake/alert Behavior During Therapy: WFL for tasks assessed/performed Overall Cognitive Status: Within  Functional Limits for tasks assessed                                       General Comments  Pt reported mild dizziness that resolved <15 seconds during supine  > sit transition, increased dizziness after walking back from bathroom, BP  158/55 once supine in bed    Exercises     Shoulder Instructions      Home Living Family/patient expects to be discharged to:: Private residence Living Arrangements: Children;Other relatives Available Help at Discharge: Family Type of Home: House Home Access: Stairs to enter [employer] of Steps: 2 Entrance Stairs-Rails: None Home Layout: Two level Alternate Level Stairs-Number of Steps: 15 Alternate Level Stairs-Rails: Can reach both Bathroom Shower/Tub: Teacher, early years/pre: Standard     Home Equipment: None          Prior Functioning/Environment Prior Level of Function : Independent/Modified Independent                        OT Problem List: Decreased strength;Decreased range of motion;Decreased activity tolerance;Impaired balance (sitting and/or standing);Decreased safety awareness      OT Treatment/Interventions: Self-care/ADL training;DME and/or AE instruction;Cognitive remediation/compensation;Patient/family education;Balance training;Therapeutic exercise    OT Goals(Current goals can be found in the care plan section) Acute Rehab OT Goals Patient Stated Goal: return PLOF OT Goal Formulation: With patient Time For Goal Achievement: 05/22/21 Potential to Achieve Goals: Good  OT Frequency: Min 2X/week   Barriers to D/C: Decreased caregiver support  pt reports she would be alone for ~12 hrs/day due to family being at work       Co-evaluation              AM-PAC OT "6 Clicks" Daily Activity     Outcome Measure Help from another person eating meals?: None Help from another person taking care of personal grooming?: None Help from another person toileting, which includes using toliet, bedpan, or urinal?: None Help from another person bathing (including washing, rinsing, drying)?: A Lot Help from another person to put on and taking off regular upper body clothing?: A Little Help from another person to put on and taking  off regular lower body clothing?: A Little 6 Click Score: 20   End of Session Equipment Utilized During Treatment: Gait belt Nurse Communication: Mobility status  Activity Tolerance: Patient tolerated treatment well Patient left: in bed;with call bell/phone within reach;with bed alarm set;Other (comment) (MD in room)  OT Visit Diagnosis: Unsteadiness on feet (R26.81);Other abnormalities of gait and mobility (R26.89);Muscle weakness (generalized) (M62.81)                Time: 9449-6759 OT Time Calculation (min): 41 min Charges:  OT General Charges $OT Visit: 1 Visit OT Evaluation $OT Eval Low Complexity: 1 Low OT Treatments $Self Care/Home Management : 23-37 mins  Lynnda Child, OTD, OTR/L Acute Rehab [phone]) [phone] [place] 05/08/2021, 9:42 AM

## 2021-05-08 NOTE — Progress Notes (Signed)
12/28 IM Letter given to patient however patient would not sign. Interpreter provided to patient [place]) to no avail to sign.

## 2021-05-08 NOTE — Progress Notes (Addendum)
NAMEJavanna Estrada, MRN:  [id number], DOB:  [birth year], LOS: 5 ADMISSION DATE:  05/02/2021, CONSULTATION DATE:  05/08/2021 REFERRING MD:  Robin Brod, MD, CHIEF COMPLAINT:  lung mass   History of Present Illness:  This is a 75 yo woman with past medical history of breast cancer s/p mastectomy and radiation in 2017 who presents with several months of decreased appetite, weight loss, nausea, not feeling well. She has had a couple outpatient treamtents with PCP for pneumonia vs URI with family to improve. Interview conducted with help of russian video interpreter. She denies currently any cough, shortness of breath or hemoptysis. She is a lifelong never smoker. She grew up in [place]. She presented with worsening weakness and anorexia today. She had a CT Chest A/P which shows right lung mass with hilar adenopathy and diffuse osseous metastatic disease. PCCM consulted to assist with diagnosis of lung mass - concerning for primary lung malignancy.   Pertinent  Medical History  Breast Cancer s/p mastectomy and radiation HTN HLD   Significant Hospital Events: Including procedures, antibiotic start and stop dates in addition to other pertinent events   12/27 EBUS bronchoscopy   Interim History / Subjective:  Denies hemoptysis. Has some wheezing noticeable overnight but has improved this morning  Objective   Blood pressure 125/69, pulse 71, temperature 98.4 F (36.9 C), temperature source Oral, resp. rate 14, height 5\' 5"  (1.651 m), weight 67.1 kg, SpO2 97 %.        Intake/Output Summary (Last 24 hours) at 05/08/2021 0950 Last data filed at 05/07/2021 1027 Gross per 24 hour  Intake 600 ml  Output 0 ml  Net 600 ml   Filed Weights   05/02/21 2325 05/04/21 0945 05/07/21 0815  Weight: 67.1 kg 67.1 kg 67.1 kg   Physical Exam: General: Chronically ill-appearing, no acute distress HENT: [place], AT, OP clear, MMM Eyes: EOMI, no scleral icterus Respiratory: Clear to auscultation  bilaterally.  No crackles, wheezing or rales Cardiovascular: RRR, -M/R/G, no JVD Extremities:-Edema,-tenderness Neuro: AAO x4, CNII-XII grossly intact  Stable Hg ~7.9  CTA RUL mass measuring 2.7 x 3.8 x 2.8 cm with right hilar and mediastinal adenopathy with diffuse osseous metastatic disease  Bronchoscopy Cytology 12/27 pending  Imaging, labs and test noted above have been reviewed independently by me.   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 CXR post-procedure with concern for hemorrhage however no active s/sx bleeding. CXR with resolution of hemorrhage, unchanged RUL mass.  --Await cytology. If tissue sampling insufficient, will need IR biopsy. --PRN albuterol for wheezing --Monitor for s/sx hemoptysis --Pulmonary will intermittently follow. Please call for any urgent questions or concerns.  Evaluated patient as noted above. Discussed plan with patient using [place] video interpretor. Addressed extensive questions and concerns. Care Time 35 min  Rodman Pickle, M.D. [facility] Pulmonary/Critical Care Medicine 05/08/2021 9:54 AM   See Amion for personal pager For hours between 7 PM to 7 AM, please call Elink for urgent questions   Best Practice (right click and "Reselect all SmartList Selections" daily)  Per primary  Labs   CBC: Recent Labs  Lab 05/02/21 2343 05/03/21 2017 05/04/21 0629 05/05/21 1331 05/06/21 0442 05/07/21 0634 05/08/21 0605  WBC 14.0*  --  11.0* 11.5* 11.5* 13.8* 13.5*  NEUTROABS 11.6*  --   --   --   --   --   --  HGB 6.9*   < > 6.7* 7.0* 6.4* 7.9* 7.9*  HCT 23.2*   < > 22.6* 24.2* 22.1* 26.4* 26.4*  MCV 72.3*  --  72.2* 73.3* 73.2* 72.7* 74.4*  PLT 679*  --  536* 582* 541* 518* 488*   < > = values in this interval not displayed.     Basic Metabolic Panel: Recent Labs  Lab 05/02/21 2343 05/04/21 0629 05/05/21 1331 05/06/21 0256 05/07/21 0634 05/08/21 0605  NA 128* 132* 134* 134* 135 135  K 3.4* 3.4* 3.7 4.1 3.2* 3.5  CL 94* 98 102 102 97* 96*  CO2 22 24 23 22 27 26   GLUCOSE 151* 120* 138* 128* 136* 176*  BUN 7* 7* 10 11 9 10   CREATININE 0.62 0.70 0.70 0.68 0.66 0.86  CALCIUM 8.9 8.4* 8.8* 8.3* 8.6* 8.2*  MG 1.9  --   --   --   --   --

## 2021-05-09 ENCOUNTER — Encounter (HOSPITAL_COMMUNITY)
Admission: EM | Disposition: A | Discharge status: Home / Self Care | Payer: Self-pay | Source: Home / Self Care | Attending: Family Medicine

## 2021-05-09 ENCOUNTER — Inpatient Hospital Stay (HOSPITAL_COMMUNITY): Payer: Medicare HMO | Admitting: Anesthesiology

## 2021-05-09 ENCOUNTER — Encounter (HOSPITAL_COMMUNITY): Payer: Self-pay | Admitting: Internal Medicine

## 2021-05-09 DIAGNOSIS — R918 Other nonspecific abnormal finding of lung field: Secondary | ICD-10-CM | POA: Diagnosis not present

## 2021-05-09 DIAGNOSIS — I5033 Acute on chronic diastolic (congestive) heart failure: Secondary | ICD-10-CM | POA: Diagnosis not present

## 2021-05-09 DIAGNOSIS — D120 Benign neoplasm of cecum: Secondary | ICD-10-CM | POA: Insufficient documentation

## 2021-05-09 DIAGNOSIS — Z515 Encounter for palliative care: Secondary | ICD-10-CM | POA: Diagnosis not present

## 2021-05-09 DIAGNOSIS — D125 Benign neoplasm of sigmoid colon: Secondary | ICD-10-CM | POA: Diagnosis not present

## 2021-05-09 DIAGNOSIS — D62 Acute posthemorrhagic anemia: Secondary | ICD-10-CM | POA: Diagnosis not present

## 2021-05-09 DIAGNOSIS — K862 Cyst of pancreas: Secondary | ICD-10-CM | POA: Diagnosis not present

## 2021-05-09 DIAGNOSIS — C7951 Secondary malignant neoplasm of bone: Secondary | ICD-10-CM | POA: Diagnosis not present

## 2021-05-09 DIAGNOSIS — K922 Gastrointestinal hemorrhage, unspecified: Secondary | ICD-10-CM | POA: Diagnosis not present

## 2021-05-09 DIAGNOSIS — Z20822 Contact with and (suspected) exposure to covid-19: Secondary | ICD-10-CM | POA: Diagnosis not present

## 2021-05-09 DIAGNOSIS — I10 Essential (primary) hypertension: Secondary | ICD-10-CM | POA: Diagnosis not present

## 2021-05-09 HISTORY — PX: POLYPECTOMY: SHX5525

## 2021-05-09 HISTORY — PX: SUBMUCOSAL TATTOO INJECTION: SHX6856

## 2021-05-09 HISTORY — PX: COLONOSCOPY WITH PROPOFOL: SHX5780

## 2021-05-09 LAB — CBC
HCT: 26.1 % — ABNORMAL LOW (ref 36.0–46.0)
Hemoglobin: 7.7 g/dL — ABNORMAL LOW (ref 12.0–15.0)
MCH: 21.9 pg — ABNORMAL LOW (ref 26.0–34.0)
MCHC: 29.5 g/dL — ABNORMAL LOW (ref 30.0–36.0)
MCV: 74.1 fL — ABNORMAL LOW (ref 80.0–100.0)
Platelets: 444 10*3/uL — ABNORMAL HIGH (ref 150–400)
RBC: 3.52 MIL/uL — ABNORMAL LOW (ref 3.87–5.11)
RDW: 20.4 % — ABNORMAL HIGH (ref 11.5–15.5)
WBC: 11.7 10*3/uL — ABNORMAL HIGH (ref 4.0–10.5)
nRBC: 0 % (ref 0.0–0.2)

## 2021-05-09 SURGERY — COLONOSCOPY WITH PROPOFOL
Anesthesia: Monitor Anesthesia Care

## 2021-05-09 SURGERY — COLONOSCOPY
Anesthesia: Monitor Anesthesia Care

## 2021-05-09 MED ORDER — SODIUM CHLORIDE 0.9 % IV SOLN: INTRAVENOUS | Status: DC

## 2021-05-09 MED ORDER — LIDOCAINE 2% (20 MG/ML) 5 ML SYRINGE
INTRAMUSCULAR | Status: DC | PRN
  Administered 2021-05-09: 20 mg via INTRAVENOUS

## 2021-05-09 MED ORDER — SPOT INK MARKER SYRINGE KIT
PACK | SUBMUCOSAL | Status: AC
  Filled 2021-05-09: qty 5

## 2021-05-09 MED ORDER — SODIUM CHLORIDE 0.9 % IV SOLN
INTRAVENOUS | Status: AC | PRN
  Administered 2021-05-09: 500 mL via INTRAVENOUS

## 2021-05-09 MED ORDER — SPOT INK MARKER SYRINGE KIT
PACK | SUBMUCOSAL | Status: DC | PRN
  Administered 2021-05-09: 2 mL via SUBMUCOSAL

## 2021-05-09 MED ORDER — PROPOFOL 500 MG/50ML IV EMUL
INTRAVENOUS | Status: DC | PRN
  Administered 2021-05-09: 150 ug/kg/min via INTRAVENOUS

## 2021-05-09 MED ORDER — LACTATED RINGERS IV SOLN: INTRAVENOUS | Status: DC | PRN

## 2021-05-09 SURGICAL SUPPLY — 22 items

## 2021-05-09 NOTE — Anesthesia Procedure Notes (Signed)
Procedure Name: MAC Date/Time: 05/09/2021 12:23 PM Performed by: Lavell Luster, CRNA Pre-anesthesia Checklist: Patient identified, Emergency Drugs available, Suction available, Patient being monitored and Timeout performed Patient Re-evaluated:Patient Re-evaluated prior to induction Oxygen Delivery Method: Nasal cannula Preoxygenation: Pre-oxygenation with 100% oxygen Induction Type: IV induction Placement Confirmation: breath sounds checked- equal and bilateral and positive ETCO2 Dental Injury: Teeth and Oropharynx as per pre-operative assessment

## 2021-05-09 NOTE — Progress Notes (Signed)
Interventional Radiology Brief Note  IR aware of request for lung mass biopsy.  Will review case and imaging with IR MD.  Note patient currently hesitant to proceed with additional biopsy per primary team.   NPO p MN  Brynda Greathouse, MS RD PA-C

## 2021-05-09 NOTE — Progress Notes (Signed)
[facility] Progress Note  Patient: Robin Estrada    SEG:[id number]  DOA: 05/02/2021    Date of Service: the patient was seen and examined on 05/09/2021  Brief hospital course: 75 year old Robin Estrada female with history of breast cancer and hypertension admitted on 12/22 with complaints of anorexia and weight loss x3 months and found to have hemoglobin at 6.9 with Hemoccult positive.  CT of abdomen pelvis with contrast notes a cystic lesion of tail of pancreas with infiltrative disease of the liver and chest x-ray notes right upper lobe mass.  Admitted to hospital service.  GI consulted and patient underwent EGD on 12/24, noting gastritis and ulcer, not consistent with level of hemoglobin.  Palliative care consulted and patient is understanding of her overall condition and likely diagnosis and prognosis.  For now, she wishes to be a full code.  Initially refused blood transfusion, but after discussion with son, amenable.  Patient received 1 unit packed red blood cells on the night of 12/26.  Also has received IV iron.  Underwent bronchoscopy on the morning of 12/27 with multiple areas biopsied however pathology came back unremarkable in these areas.  The largest mass in the hilum unable to be biopsied.  Patient is upset about this and at this time, is unsure if she wants interventional radiology to do ultrasound-guided biopsy.  Patient underwent colonoscopy on 12/29.  Several polyps removed, but no masses for active areas of bleeding noted.  Assessment and Plan: Cardiovascular and Mediastinum Acute on chronic diastolic CHF (congestive heart failure) (HCC) Assessment & Plan Noted on echocardiogram from 3 years ago.  Given hypertensive urgency issues, suspect that blood transfusion has led to increased volume and volume overload.  BNP at 404.  Have started gentle IV Lasix  Essential hypertension Assessment & Plan Blood pressure slightly elevated secondary to volume overload.  BNP of 404.   Gentle IV Lasix..  Continue metoprolol.  Also on as needed clonidine and once a week Maxide.   Digestive Cystic mass of pancreas Assessment & Plan CA 19-9 level normal.  Her back pain could be from thoracic compression fracture or from pancreatitis.  Checking lipase as well  * GI bleed Assessment & Plan Unclear etiology.  Findings on EGD not proportionate for blood loss.  Colonoscopy possibly tomorrow depending on when bronchoscopy is.  Otherwise on Wednesday.  Musculoskeletal and Integument Pathologic compression fracture of thoracic vertebra [facility]) Assessment & Plan As needed hydrocodone.  PT recommends outpatient PT  Hematopoietic and Hemostatic Thrombocytosis Assessment & Plan Likely reactive  Other Leukocytosis Assessment & Plan Suspect stress margination.  No antibiotics and white blood cell count down to 11 on the following day.  Slightly elevated today.  Hypokalemia Assessment & Plan Replace as needed  Hypoalbuminemia Assessment & Plan Decreased p.o. intake likely in the setting of malignancy.  Nutrition to see  Lung mass Assessment & Plan Possible primary versus metastatic disease.  For bronchoscopy on Tuesday  Acute blood loss anemia Assessment & Plan Patient initially was refusing blood transfusion, only excepting IV iron.  After discussion with son, now amenable.  Underwent transfusion of 1 unit on 12/26.  Continue to monitor hemoglobin.     Body mass index is 24.62 kg/m.  Nutrition Problem: Inadequate oral intake Etiology: poor appetite     Consultants: Gastroenterology Critical care/pulmonary Palliative care  Procedures: Planned colonoscopy 12/28 Status post bronchoscopy 12/27 with multiple biopsies.  Results are pending. Status post EGD  Antimicrobials: None  Code Status: Full code   Subjective: Sleeping  comfortably after colonoscopy.  Objective: Vitals:   05/09/21 1323 05/09/21 1340  BP: (!) 158/60 (!) 147/55  Pulse: 79 81   Resp: (!) 23 18  Temp: 97.7 F (36.5 C) 98.1 F (36.7 C)  SpO2: 94% 96%    Intake/Output Summary (Last 24 hours) at 05/09/2021 1445 Last data filed at 05/09/2021 1250 Gross per 24 hour  Intake 300 ml  Output --  Net 300 ml    Filed Weights   05/04/21 0945 05/07/21 0815 05/09/21 1117  Weight: 67.1 kg 67.1 kg 67.1 kg   Body mass index is 24.62 kg/m.  Exam:  General: Resting comfortably HEENT: Normocephalic atraumatic, mucous membranes are moist Cardiovascular: Regular rate and rhythm, S1-S2 Respiratory: Decreased breath sounds throughout, no wheezing Abdomen: Soft, nontender, nondistended, positive bowel sounds Musculoskeletal: No clubbing or cyanosis or edema Skin: No skin break, tears or lesions Psychiatry: Appropriate, no evidence of psychoses Neurology: No focal deficits  Data Reviewed: Today's labs reviewed.  Hemoglobin stable at 7.7.  Disposition:  Status is: Inpatient  Remains inpatient appropriate because: Work-up for lung mass pending.   Family Communication: Updated son by phone.  DVT Prophylaxis: SCDs Start: 05/03/21 1751    Author: Annita Brod ,MD 05/09/2021 2:45 PM  To reach On-call, see care teams to locate the attending and reach out via [email or web address]. Between 7PM-7AM, please contact night-coverage If you still have difficulty reaching the attending provider, please page the [facility] (Director on Call) for [facility] on amion for assistance.

## 2021-05-09 NOTE — Anesthesia Preprocedure Evaluation (Addendum)
Anesthesia Evaluation  Patient identified by MRN, date of birth, ID band Patient awake    Reviewed: Allergy & Precautions, NPO status , Patient's Chart, lab work & pertinent test results, reviewed documented beta blocker date and time   History of Anesthesia Complications Negative for: history of anesthetic complications  Airway Mallampati: I  TM Distance: >3 FB Neck ROM: Full    Dental  (+) Missing, Dental Advisory Given   Pulmonary neg pulmonary ROS,    breath sounds clear to auscultation       Cardiovascular hypertension, Pt. on medications and Pt. on home beta blockers (-) angina+ Valvular Problems/Murmurs (h/o MVP, not present on last ECHO) MVP  Rhythm:Regular Rate:Normal  07/28/2020 ECHO: Left ventricle: The cavity size was normal. Wall thickness wasnormal. Systolic function was normal. EF 55% to 60%. Wall motion was normal; no regional wall motion abnormalities. grade 1 diastolic dysfunction.  - Aortic valve: There was trivial regurgitation   Neuro/Psych  Headaches,    GI/Hepatic Neg liver ROS, GI bleed   Endo/Other  negative endocrine ROS  Renal/GU negative Renal ROS     Musculoskeletal   Abdominal   Peds  Hematology  (+) Blood dyscrasia (Hb 7.7), anemia ,   Anesthesia Other Findings   Reproductive/Obstetrics                            Anesthesia Physical Anesthesia Plan  ASA: 3  Anesthesia Plan: MAC   Post-op Pain Management: Minimal or no pain anticipated   Induction:   PONV Risk Score and Plan: 2 and Propofol infusion, TIVA and Treatment may vary due to age or medical condition  Airway Management Planned: Natural Airway and Mask  Additional Equipment: None  Intra-op Plan:   Post-operative Plan:   Informed Consent: I have reviewed the patients History and Physical, chart, labs and discussed the procedure including the risks, benefits and alternatives for the proposed  anesthesia with the patient or authorized representative who has indicated his/her understanding and acceptance.     Dental advisory given and Interpreter used for interveiw  Plan Discussed with: CRNA and Surgeon  Anesthesia Plan Comments:         Anesthesia Quick Evaluation

## 2021-05-09 NOTE — Anesthesia Postprocedure Evaluation (Signed)
Anesthesia Post Note  Patient: Robin Estrada  Procedure(s) Performed: COLONOSCOPY WITH PROPOFOL POLYPECTOMY HOT HEMOSTASIS (ARGON PLASMA COAGULATION/BICAP) SUBMUCOSAL TATTOO INJECTION     Patient location during evaluation: PACU Anesthesia Type: MAC Level of consciousness: awake and alert, patient cooperative and oriented Pain management: pain level controlled Vital Signs Assessment: post-procedure vital signs reviewed and stable Respiratory status: nonlabored ventilation, spontaneous breathing and respiratory function stable Cardiovascular status: stable and blood pressure returned to baseline Anesthetic complications: no   No notable events documented.  Last Vitals:  Vitals:   05/09/21 1323 05/09/21 1340  BP: (!) 158/60 (!) 147/55  Pulse: 79 81  Resp: (!) 23 18  Temp: 36.5 C 36.7 C  SpO2: 94% 96%    Last Pain:  Vitals:   05/09/21 1340  TempSrc: Oral  PainSc:                  Lorriane Dehart,E. Tayten Heber

## 2021-05-09 NOTE — Progress Notes (Signed)
PT Cancellation Note  Patient Details Name: Robin Estrada MRN: [id number] DOB: [birth year]   Cancelled Treatment:    Reason Eval/Treat Not Completed: Other (comment). Pt refused stating she didn't need PT because she was going for a colonoscopy in 1 hour. Will continue attempts although it has been difficult to see pt due to multiple tests and refusals.    Shary Decamp [facility] 05/09/2021, 9:54 AM [place] Pager [phone] Office [phone]

## 2021-05-09 NOTE — Progress Notes (Signed)
NAMEKrystal Estrada, MRN:  [id number], DOB:  Sep 22, 1945, LOS: 6 ADMISSION DATE:  05/02/2021, CONSULTATION DATE:  05/09/2021 REFERRING MD:  Robin Brod, MD, CHIEF COMPLAINT:  lung mass   History of Present Illness:  This is a 75 yo woman with past medical history of breast cancer s/p mastectomy and radiation in 2017 who presents with several months of decreased appetite, weight loss, nausea, not feeling well. She has had a couple outpatient treamtents with PCP for pneumonia vs URI with family to improve. Interview conducted with help of russian video interpreter. She denies currently any cough, shortness of breath or hemoptysis. She is a lifelong never smoker. She grew up in [place]. She presented with worsening weakness and anorexia today. She had a CT Chest A/P which shows right lung mass with hilar adenopathy and diffuse osseous metastatic disease. PCCM consulted to assist with diagnosis of lung mass - concerning for primary lung malignancy.   Pertinent  Medical History  Breast Cancer s/p mastectomy and radiation HTN HLD   Significant Hospital Events: Including procedures, antibiotic start and stop dates in addition to other pertinent events   12/27 EBUS bronchoscopy  12/29 Colonoscopy  Interim History / Subjective:  Denies hemoptysis. Still has cough with wheezing. Has not tried nebulizers.  We discussed bronchoscopy biopsy results which are negative for malignant cells. Based on these results and the high suspicion of malignancy, I recommended additional tissue sampling via CT guided biopsy with IR. At this point, Robin Estrada became upset with doing more procedures. She expressed extreme frustration and does not wish to do any more procedures until she can speak with her son.  Objective   Blood pressure (!) 238/61, pulse 89, temperature 98.2 F (36.8 C), temperature source Oral, resp. rate (!) 22, height _0  (1.651 m), weight 67.1 kg, SpO2 96 %.       No intake or output  data in the 24 hours ending 05/09/21 1222  Filed Weights   05/04/21 0945 05/07/21 0815 05/09/21 1117  Weight: 67.1 kg 67.1 kg 67.1 kg   Physical Exam: General: Chronically ill-appearing, no acute distress HENT: [place], AT, OP clear, MMM Eyes: EOMI, no scleral icterus Respiratory: Occasional RUL wheeze Cardiovascular: RRR, -M/R/G, no JVD Extremities:-Edema,-tenderness Neuro: AAO x4, CNII-XII grossly intact  Stable Hg 7.7  CTA RUL mass measuring 2.7 x 3.8 x 2.8 cm with right hilar and mediastinal adenopathy with diffuse osseous metastatic disease  Bronchoscopy 05/07/21 Cytology for BAL, RUL brushing, lymph nodes (7, 4R) and RUL biopsy  Imaging, labs and test noted above have been reviewed independently by me.  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 CXR post-procedure with concern for hemorrhage however no active s/sx bleeding. CXR with resolution of hemorrhage, unchanged RUL mass.  Discussed results from EBUS 05/07/21. Cytology for BAL, RUL brushing, lymph nodes (7, 4R) and RUL biopsy  --Recommend CT-guided biopsy with IR if patient amenable to procedure in the future --PRN albuterol nebulizer for wheezing  Discussed plan with patient via video interpretor. Updated primary team Care Time 36 min  Pulmonary available as needed.  Rodman Pickle, M.D. [facility] Pulmonary/Critical Care Medicine 05/09/2021 12:22 PM   See Amion for personal pager For hours between 7 PM to 7 AM, please call Elink for urgent questions   Best Practice (right click and "  Reselect all SmartList Selections" daily)  Per primary  Labs   CBC: Recent Labs  Lab 05/02/21 2343 05/03/21 2017 05/05/21 1331 05/06/21 0442 05/07/21 0634 05/08/21 0605 05/09/21 0604  WBC 14.0*   < > 11.5* 11.5* 13.8*  13.5* 11.7*  NEUTROABS 11.6*  --   --   --   --   --   --   HGB 6.9*   < > 7.0* 6.4* 7.9* 7.9* 7.7*  HCT 23.2*   < > 24.2* 22.1* 26.4* 26.4* 26.1*  MCV 72.3*   < > 73.3* 73.2* 72.7* 74.4* 74.1*  PLT 679*   < > 582* 541* 518* 488* 444*   < > = values in this interval not displayed.    Basic Metabolic Panel: Recent Labs  Lab 05/02/21 2343 05/04/21 0629 05/05/21 1331 05/06/21 0256 05/07/21 0634 05/08/21 0605  NA 128* 132* 134* 134* 135 135  K 3.4* 3.4* 3.7 4.1 3.2* 3.5  CL 94* 98 102 102 97* 96*  CO2 _0 GLUCOSE 151* 120* 138* 128* 136* 176*  BUN 7* 7* _1 CREATININE 0.62 0.70 0.70 0.68 0.66 0.86  CALCIUM 8.9 8.4* 8.8* 8.3* 8.6* 8.2*  MG 1.9  --   --   --   --   --

## 2021-05-09 NOTE — Transfer of Care (Signed)
Immediate Anesthesia Transfer of Care Note  Patient: Robin Estrada  Procedure(s) Performed: COLONOSCOPY WITH PROPOFOL POLYPECTOMY HOT HEMOSTASIS (ARGON PLASMA COAGULATION/BICAP) SUBMUCOSAL TATTOO INJECTION  Patient Location: PACU  Anesthesia Type:MAC  Level of Consciousness: sedated  Airway & Oxygen Therapy: Patient connected to nasal cannula oxygen  Post-op Assessment: Post -op Vital signs reviewed and stable  Post vital signs: stable  Last Vitals:  Vitals Value Taken Time  BP    Temp    Pulse 70 05/09/21 1258  Resp 24 05/09/21 1258  SpO2 88 % 05/09/21 1258  Vitals shown include unvalidated device data.  Last Pain:  Vitals:   05/09/21 0455  TempSrc: Oral  PainSc:       Patients Stated Pain Goal: 2 (37/16/96 7893)  Complications: No notable events documented.

## 2021-05-09 NOTE — Op Note (Signed)
[facility] Patient Name: Robin Estrada Procedure Date : 05/09/2021 MRN: [id number] Attending MD: Ladene Artist , MD Date of Birth: [birth year] CSN: [id number] Age: 75 Admit Type: Outpatient Procedure:                Colonoscopy Indications:              Unexplained iron deficiency anemia Providers:                Pricilla Riffle. Fuller Plan, MD, Doristine Johns, RN, Luan Moore, Technician, Theodoro Grist, CRNA Referring MD:             [facility] Medicines:                Monitored Anesthesia Care Complications:            No immediate complications. Estimated blood loss:                            None. Estimated Blood Loss:     Estimated blood loss: none. Procedure:                Pre-Anesthesia Assessment:                           - Prior to the procedure, a History and Physical                            was performed, and patient medications and                            allergies were reviewed. The patient's tolerance of                            previous anesthesia was also reviewed. The risks                            and benefits of the procedure and the sedation                            options and risks were discussed with the patient.                            All questions were answered, and informed consent                            was obtained. Prior Anticoagulants: The patient has                            taken no previous anticoagulant or antiplatelet                            agents. ASA Grade Assessment: III - A patient with  severe systemic disease. After reviewing the risks                            and benefits, the patient was deemed in                            satisfactory condition to undergo the procedure.                           After obtaining informed consent, the colonoscope                            was passed under direct vision. Throughout the                             procedure, the patient's blood pressure, pulse, and                            oxygen saturations were monitored continuously. The                            [id number]-HQ190TL ([id number] Olympus peds colonoscope was                            introduced through the anus and advanced to the the                            cecum, identified by appendiceal orifice and                            ileocecal valve. The ileocecal valve, appendiceal                            orifice, and rectum were photographed. The quality                            of the bowel preparation was adequate. The                            colonoscopy was performed without difficulty. The                            patient tolerated the procedure well. Scope In: 12:15:39 PM Scope Out: 06:30:16 PM Scope Withdrawal Time: 0 hours 30 minutes 31 seconds  Total Procedure Duration: 0 hours 34 minutes 3 seconds  Findings:      The perianal and digital rectal examinations were normal.      A 20 mm firm, irregluarly shaped polyp was found in the cecum. The polyp       was semi-pedunculated on a short, wide pedicle. The polyp was removed       with a hot snare. Resection and retrieval were complete.      A 10 mm firm polyp was found in the sigmoid colon. The polyp was       sessile. The polyp was removed with  a hot snare. Resection and retrieval       were complete. Two area were tattooed with an injection of 2 mL of Spot       (carbon black) 5 cm distal to the site.      A few small-mouthed diverticula were found in the left colon.      The exam was otherwise without abnormality on direct and retroflexion       views. Impression:               - One 20 mm polyp in the cecum, removed with a hot                            snare. Resected and retrieved.                           - One 10 mm polyp in the sigmoid colon, removed                            with a hot snare. Resected and retrieved. Tattooed.                           -  Mild diverticulosis in the left colon.                           - The examination was otherwise normal on direct                            and retroflexion views. Recommendation:           - Repeat colonoscopy after studies are complete for                            surveillance based on pathology results with Dr.                            Rush Landmark.                           - Patient has a contact number available for                            emergencies. The signs and symptoms of potential                            delayed complications were discussed with the                            patient. Return to normal activities tomorrow.                            Written discharge instructions were provided to the                            patient.                           -  Resume previous diet.                           - Continue present medications.                           - Await pathology results.                           - No aspirin, ibuprofen, naproxen, or other                            non-steroidal anti-inflammatory drugs for 2 weeks                            after polyp removal. Procedure Code(s):        --- Professional ---                           [phone], Colonoscopy, flexible; with removal of                            tumor(s), polyp(s), or other lesion(s) by snare                            technique                           45381, Colonoscopy, flexible; with directed                            submucosal injection(s), any substance Diagnosis Code(s):        --- Professional ---                           K63.5, Polyp of colon                           D50.9, Iron deficiency anemia, unspecified                           K57.30, Diverticulosis of large intestine without                            perforation or abscess without bleeding CPT copyright 2019 [employer]. All rights reserved. The codes documented in this report are preliminary  and upon coder review may  be revised to meet current compliance requirements. Ladene Artist, MD 05/09/2021 1:04:16 PM This report has been signed electronically. Number of Addenda: 0

## 2021-05-09 NOTE — Interval H&P Note (Signed)
History and Physical Interval Note:  05/09/2021 11:59 AM  Robin Estrada  has presented today for surgery, with the diagnosis of GI bleed.  The various methods of treatment have been discussed with the patient and family. After consideration of risks, benefits and other options for treatment, the patient has consented to  Procedure(s): COLONOSCOPY WITH PROPOFOL (N/A) as a surgical intervention.  The patient's history has been reviewed, patient examined, no change in status, stable for surgery.  I have reviewed the patient's chart and labs.  Questions were answered to the patient's satisfaction.     Pricilla Riffle. Fuller Plan

## 2021-05-10 DIAGNOSIS — I5033 Acute on chronic diastolic (congestive) heart failure: Secondary | ICD-10-CM | POA: Diagnosis not present

## 2021-05-10 DIAGNOSIS — I10 Essential (primary) hypertension: Secondary | ICD-10-CM | POA: Diagnosis not present

## 2021-05-10 DIAGNOSIS — D500 Iron deficiency anemia secondary to blood loss (chronic): Secondary | ICD-10-CM | POA: Diagnosis not present

## 2021-05-10 DIAGNOSIS — K922 Gastrointestinal hemorrhage, unspecified: Secondary | ICD-10-CM | POA: Diagnosis not present

## 2021-05-10 LAB — COMPREHENSIVE METABOLIC PANEL
ALT: 11 U/L (ref 0–44)
AST: 13 U/L — ABNORMAL LOW (ref 15–41)
Albumin: 2.3 g/dL — ABNORMAL LOW (ref 3.5–5.0)
Alkaline Phosphatase: 84 U/L (ref 38–126)
Anion gap: 10 (ref 5–15)
BUN: 7 mg/dL — ABNORMAL LOW (ref 8–23)
CO2: 27 mmol/L (ref 22–32)
Calcium: 8.5 mg/dL — ABNORMAL LOW (ref 8.9–10.3)
Chloride: 98 mmol/L (ref 98–111)
Creatinine, Ser: 0.67 mg/dL (ref 0.44–1.00)
GFR, Estimated: >60 mL/min (ref >60)
Glucose, Bld: 121 mg/dL — ABNORMAL HIGH (ref 70–99)
Potassium: 3.4 mmol/L — ABNORMAL LOW (ref 3.5–5.1)
Sodium: 135 mmol/L (ref 135–145)
Total Bilirubin: 0.6 mg/dL (ref 0.3–1.2)
Total Protein: 5.7 g/dL — ABNORMAL LOW (ref 6.5–8.1)

## 2021-05-10 LAB — CBC
HCT: 25.2 % — ABNORMAL LOW (ref 36.0–46.0)
Hemoglobin: 7.4 g/dL — ABNORMAL LOW (ref 12.0–15.0)
MCH: 21.8 pg — ABNORMAL LOW (ref 26.0–34.0)
MCHC: 29.4 g/dL — ABNORMAL LOW (ref 30.0–36.0)
MCV: 74.1 fL — ABNORMAL LOW (ref 80.0–100.0)
Platelets: UNDETERMINED 10*3/uL (ref 150–400)
RBC: 3.4 MIL/uL — ABNORMAL LOW (ref 3.87–5.11)
RDW: 20.5 % — ABNORMAL HIGH (ref 11.5–15.5)
WBC: 8.9 10*3/uL (ref 4.0–10.5)
nRBC: 0 % (ref 0.0–0.2)

## 2021-05-10 NOTE — Progress Notes (Signed)
Occupational Therapy Treatment Patient Details Name: Robin Estrada MRN: [id number] DOB: [birth year] Today's Date: 05/10/2021   History of present illness 75 y.o. female presents to [facility] hospital on 05/02/2021 with complaints of weakness, DOE. 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   OT comments  Pt progressing towards goals this session, min guard for standing ADLs, mod I for bed mobility and supervision for transfers. Pt limited by low back pain this session, RN aware. Pt presenting with decreased dizziness with mobility and VSS during session. Pt presenting with impairments listed below, will continue to follow in the acute setting. Recommend home with HHOT at d/c.   Recommendations for follow up therapy are one component of a multi-disciplinary discharge planning process, led by the attending physician.  Recommendations may be updated based on patient status, additional functional criteria and insurance authorization.    Follow Up Recommendations  Home health OT    Assistance Recommended at Discharge Set up Supervision/Assistance  Equipment Recommendations  Tub/shower seat    Recommendations for Other Services PT consult    Precautions / Restrictions Precautions Precautions: Fall Precaution Comments: anemic Restrictions Weight Bearing Restrictions: No       Mobility Bed Mobility Overal bed mobility: Modified Independent             General bed mobility comments: able to move sit > supine in bed, and scoot self up in bed without difficulty or use of bedrails    Transfers Overall transfer level: Needs assistance   Transfers: Sit to/from Stand Sit to Stand: Supervision                 Balance Overall balance assessment: Mild  deficits observed, not formally tested                                         ADL either performed or assessed with clinical judgement   ADL Overall ADL's : Needs assistance/impaired                         Toilet Transfer: Min guard;Ambulation;Regular Glass blower/designer Details (indicate cue type and reason): simulated in room Toileting- Clothing Manipulation and Hygiene: Supervision/safety;Sit to/from stand              Extremity/Trunk Assessment Upper Extremity Assessment Upper Extremity Assessment: Overall WFL for tasks assessed   Lower Extremity Assessment Lower Extremity Assessment: Defer to PT evaluation        Vision   Vision Assessment?: No apparent visual deficits   Perception Perception Perception: Not tested   Praxis Praxis Praxis: Not tested    Cognition Arousal/Alertness: Awake/alert Behavior During Therapy: WFL for tasks assessed/performed Overall Cognitive Status: Within Functional Limits for tasks assessed                                            Exercises     Shoulder Instructions       General Comments decreased dizziness this session, BP taken (144/60 seated), (136/56 standing), and (130/73 supine), HR in mid 70's throughout  Pertinent Vitals/ Pain       Pain Assessment: Faces Pain Score: 3  Pain Location: low back Pain Descriptors / Indicators: Aching Pain Intervention(s): Limited activity within patient's tolerance;Monitored during session;Repositioned;Patient requesting pain meds-RN notified  Home Living                                          Prior Functioning/Environment              Frequency  Min 2X/week        Progress Toward Goals  OT Goals(current goals can now be found in the care plan section)     Acute Rehab OT Goals Patient Stated Goal: return PLOF OT Goal Formulation: With patient Time For Goal Achievement: 05/22/21 Potential  to Achieve Goals: Good  Plan Discharge plan remains appropriate;Frequency remains appropriate    Co-evaluation                 AM-PAC OT "6 Clicks" Daily Activity     Outcome Measure   Help from another person eating meals?: None Help from another person taking care of personal grooming?: None Help from another person toileting, which includes using toliet, bedpan, or urinal?: None Help from another person bathing (including washing, rinsing, drying)?: A Little Help from another person to put on and taking off regular upper body clothing?: None Help from another person to put on and taking off regular lower body clothing?: A Little 6 Click Score: 22    End of Session Equipment Utilized During Treatment: Gait belt  OT Visit Diagnosis: Unsteadiness on feet (R26.81);Other abnormalities of gait and mobility (R26.89);Muscle weakness (generalized) (M62.81)   Activity Tolerance Patient tolerated treatment well   Patient Left in bed;with call bell/phone within reach;with bed alarm set   Nurse Communication Mobility status;Patient requests pain meds        Time: 6222-9798 OT Time Calculation (min): 18 min  Charges: OT General Charges $OT Visit: 1 Visit OT Treatments $Self Care/Home Management : 8-22 mins  Lynnda Child, OTD, OTR/L Acute Rehab [phone] - [place] 05/10/2021, 3:25 PM

## 2021-05-10 NOTE — Progress Notes (Signed)
PT Cancellation Note  Patient Details Name: Robin Estrada MRN: [id number] DOB: 1945-12-17   Cancelled Treatment:    Reason Eval/Treat Not Completed: Patient declined, no reason specified Pt declined mobility stating she is waiting for a call from [place]. Will follow.   [name] 05/10/2021, 11:37 AM Marisa Severin, PT, DPT Acute Rehabilitation Services Pager [phone] Office [phone]

## 2021-05-10 NOTE — Progress Notes (Signed)
[facility] Progress Note  Patient: Robin Estrada    IHK:[id number]  DOA: 05/02/2021    Date of Service: the patient was seen and examined on 05/10/2021  Brief hospital course: 75 year old Robin Estrada female with history of breast cancer and hypertension admitted on 12/22 with complaints of anorexia and weight loss x3 months and found to have hemoglobin at 6.9 with Hemoccult positive.  CT of abdomen pelvis with contrast notes a cystic lesion of tail of pancreas with infiltrative disease of the liver and chest x-ray notes right upper lobe mass.  Admitted to hospital service.  GI consulted and patient underwent EGD on 12/24, noting gastritis and ulcer, not consistent with level of hemoglobin.  Palliative care consulted and patient is understanding of her overall condition and likely diagnosis and prognosis.  For now, she wishes to be a full code.  Initially refused blood transfusion, but after discussion with son, amenable.  Patient received 1 unit packed red blood cells on the night of 12/26.  Also has received IV iron.  Underwent bronchoscopy on the morning of 12/27 with multiple areas biopsied however pathology came back unremarkable in these areas.  The largest mass in the hilum unable to be biopsied.  Patient underwent colonoscopy on 12/29.  Several polyps removed, but no masses for active areas of bleeding noted.  Current plan is for ultrasound-guided biopsy by interventional radiology on Tuesday, 1/3.  Assessment and Plan: Cardiovascular and Mediastinum Acute on chronic diastolic CHF (congestive heart failure) (HCC) Assessment & Plan Noted on echocardiogram from 3 years ago.  Given hypertensive urgency issues, suspect that blood transfusion has led to increased volume and volume overload.  BNP at 404.  Have started gentle IV Lasix  Essential hypertension Assessment & Plan Blood pressure slightly elevated secondary to volume overload.  BNP of 404.  Gentle IV Lasix..  Continue  metoprolol.  Also on as needed clonidine and once a week Maxide.   Digestive Cystic mass of pancreas Assessment & Plan CA 19-9 level normal.  Her back pain could be from thoracic compression fracture or from pancreatitis.  Checking lipase as well  * GI bleed Assessment & Plan Unclear etiology.  Findings on EGD not proportionate for blood loss.  Colonoscopy possibly tomorrow depending on when bronchoscopy is.  Otherwise on Wednesday.  Musculoskeletal and Integument Pathologic compression fracture of thoracic vertebra [facility]) Assessment & Plan As needed hydrocodone.  PT recommends outpatient PT  Hematopoietic and Hemostatic Thrombocytosis Assessment & Plan Likely reactive  Other Leukocytosis Assessment & Plan Suspect stress margination.  No antibiotics and white blood cell count down to 11 on the following day.  Slightly elevated today.  Hypokalemia Assessment & Plan Replace as needed  Hypoalbuminemia Assessment & Plan Decreased p.o. intake likely in the setting of malignancy.  Nutrition to see  Lung mass Assessment & Plan Possible primary versus metastatic disease.  For bronchoscopy on Tuesday  Acute blood loss anemia Assessment & Plan Patient initially was refusing blood transfusion, only excepting IV iron.  After discussion with son, now amenable.  Underwent transfusion of 1 unit on 12/26.  Continue to monitor hemoglobin.     Body mass index is 24.62 kg/m.  Nutrition Problem: Inadequate oral intake Etiology: poor appetite     Consultants: Gastroenterology Critical care/pulmonary Palliative care Interventional radiology  Procedures: Planned colonoscopy 12/28 Status post bronchoscopy 12/27 with multiple biopsies.  Results are pending. Status post EGD Planned ultrasound-guided biopsy for 1/3 by interventional radiology  Antimicrobials: None  Code Status: Full code  Subjective: No complaints, other than back pain, but manageable.  Objective: Vitals:    05/10/21 0452 05/10/21 0822  BP: (!) 130/51 (!) 138/58  Pulse: 68 74  Resp: 18 17  Temp: 98.2 F (36.8 C) 98.1 F (36.7 C)  SpO2: 95% 96%    Intake/Output Summary (Last 24 hours) at 05/10/2021 1502 Last data filed at 05/09/2021 1800 Gross per 24 hour  Intake 150 ml  Output --  Net 150 ml    Filed Weights   05/04/21 0945 05/07/21 0815 05/09/21 1117  Weight: 67.1 kg 67.1 kg 67.1 kg   Body mass index is 24.62 kg/m.  Exam:  General: Awake and alert, no acute distress, emaciated HEENT: Normocephalic atraumatic, mucous membranes are moist Cardiovascular: Regular rate and rhythm, S1-S2 Respiratory: Decreased breath sounds throughout, no wheezing Abdomen: Soft, nontender, nondistended, positive bowel sounds Musculoskeletal: No clubbing or cyanosis or edema Skin: No skin break, tears or lesions Psychiatry: Appropriate, no evidence of psychoses Neurology: No focal deficits  Data Reviewed: Today's labs reviewed.  Hemoglobin at 7.4.  White count normalized.  Disposition:  Status is: Inpatient  Remains inpatient appropriate because: Work-up for lung mass pending.   Family Communication: Updated son by phone.  DVT Prophylaxis: SCDs Start: 05/03/21 6222    Author: Annita Brod ,MD 05/10/2021 3:02 PM  To reach On-call, see care teams to locate the attending and reach out via [email or web address]. Between 7PM-7AM, please contact night-coverage If you still have difficulty reaching the attending provider, please page the [facility] (Director on Call) for [facility] on amion for assistance.

## 2021-05-10 NOTE — Consult Note (Signed)
Chief Complaint: Patient was seen in consultation today for a right upper lobe mass at the request of Dr. Maryland Pink  Supervising Physician: Jacqulynn Cadet  Patient Status: [facility] - In-pt  History of Present Illness: [name] with history of breast cancer and hypertension admitted on 12/22 with complaints of anorexia and weight loss x3 months and found to have hemoglobin at 6.9 with Hemoccult positive.  CT of abdomen pelvis with contrast notes a cystic lesion of tail of pancreas with infiltrative disease of the liver and chest x-ray notes right upper lobe mass.  Pulmonary was unsuccessful at a bronch assisted biopsy and IR has been asked to do so percutaneously.  She denies currently any cough, shortness of breath or hemoptysis. She reports night sweats, flank, and back pain.    Past Medical History:  Diagnosis Date   Breast mass, left    Large   Cancer ([place])    FIBROMYOMA  AGE 52-   BENIGN  RESOLVED   Headache    Heart murmur    Hypertension    Mitral valve prolapse    Tuberculosis    AS CHILD  W/ TX    Past Surgical History:  Procedure Laterality Date   BIOPSY  05/04/2021   Procedure: BIOPSY;  Surgeon: Irving Copas., MD;  Location: [facility] ENDOSCOPY;  Service: Gastroenterology;;   BRONCHIAL BIOPSY  05/07/2021   Procedure: BRONCHIAL BIOPSIES;  Surgeon: Margaretha Seeds, MD;  Location: [facility] ENDOSCOPY;  Service: Pulmonary;;   BRONCHIAL BRUSHINGS  05/07/2021   Procedure: BRONCHIAL BRUSHINGS;  Surgeon: Margaretha Seeds, MD;  Location: [facility] ENDOSCOPY;  Service: Pulmonary;;   BRONCHIAL NEEDLE ASPIRATION BIOPSY  05/07/2021   Procedure: BRONCHIAL NEEDLE ASPIRATION BIOPSIES;  Surgeon: Margaretha Seeds, MD;  Location: [facility] ENDOSCOPY;  Service: Pulmonary;;   BRONCHIAL WASHINGS  05/07/2021   Procedure: BRONCHIAL WASHINGS;  Surgeon: Margaretha Seeds, MD;  Location: [facility] ENDOSCOPY;  Service: Pulmonary;;   ESOPHAGOGASTRODUODENOSCOPY N/A 05/04/2021   Procedure:  ESOPHAGOGASTRODUODENOSCOPY (EGD);  Surgeon: Irving Copas., MD;  Location: [place];  Service: Gastroenterology;  Laterality: N/A;   HEMOSTASIS CONTROL  05/07/2021   Procedure: HEMOSTASIS CONTROL;  Surgeon: Margaretha Seeds, MD;  Location: [facility] ENDOSCOPY;  Service: Pulmonary;;   MASS EXCISION Left 08/14/2017   Procedure: PARTIAL EXCISION  LEFT BREAST  MASS ERAS PATHWAY;  Surgeon: Fanny Skates, MD;  Location: [place];  Service: General;  Laterality: Left;   MASTECTOMY W/ SENTINEL NODE BIOPSY Left 09/14/2017   Procedure: LEFT TOTAL MASTECTOMY WITH SENTINEL LYMPH NODE BIOPSY;  Surgeon: Fanny Skates, MD;  Location: [place];  Service: General;  Laterality: Left;   NO PAST SURGERIES     VIDEO BRONCHOSCOPY WITH ENDOBRONCHIAL ULTRASOUND N/A 05/07/2021   Procedure: VIDEO BRONCHOSCOPY WITH ENDOBRONCHIAL ULTRASOUND;  Surgeon: Margaretha Seeds, MD;  Location: [place];  Service: Pulmonary;  Laterality: N/A;    Allergies: Hydrocodone-acetaminophen, Statins, Amlodipine, Clonidine derivatives, Losartan, Sulfa antibiotics, and Sulfamethoxazole-trimethoprim  Medications: Prior to Admission medications   Medication Sig Start Date End Date Taking? Authorizing Provider  acetaminophen (TYLENOL) 500 MG tablet Take 1,000 mg by mouth every 6 (six) hours as needed for headache.   Yes [provider]  aspirin 81 MG tablet Take 81 mg by mouth daily. 04/29/18  Yes Magrinat, Virgie Dad, MD  atenolol (TENORMIN) 100 MG tablet Take 1 tablet (100 mg total) by mouth 2 (two) times daily. 08/02/20  Yes Plotnikov, Evie Lacks, MD  cholecalciferol (VITAMIN D) 1000 units tablet Take 1 tablet (1,000  Units total) by mouth daily. 08/01/16  Yes Plotnikov, Evie Lacks, MD  triamterene-hydrochlorothiazide (MAXZIDE-25) 37.5-25 MG tablet Take 1 tablet by mouth daily. 08/30/20  Yes Plotnikov, Evie Lacks, MD  vitamin C (ASCORBIC ACID) 500 MG tablet Take 1 tablet (500 mg total) by mouth every other day. 04/28/18  Yes Magrinat,  Virgie Dad, MD  cloNIDine (CATAPRES-TTS-1) 0.1 mg/24hr patch Place 1 patch (0.1 mg total) onto the skin once a week. Patient not taking: Reported on 05/03/2021 03/11/21   Plotnikov, Evie Lacks, MD     Family History  Problem Relation Age of Onset   Stroke Mother    Stroke Maternal Grandmother    Breast cancer Sister     Social History   Socioeconomic History   Marital status: Divorced    Spouse name: Not on file   Number of children: 2   Years of education: Not on file   Highest education level: Not on file  Occupational History   Not on file  Tobacco Use   Smoking status: Never   Smokeless tobacco: Never  Vaping Use   Vaping Use: Never used  Substance and Sexual Activity   Alcohol use: Yes    Alcohol/week: 0.0 standard drinks    Comment: OCC   Drug use: No   Sexual activity: Not on file  Other Topics Concern   Not on file  Social History Narrative   02-15-18 Unable to ask abuse questions son with her today.   Social Determinants of Health   Financial Resource Strain: Not on file  Food Insecurity: Not on file  Transportation Needs: Not on file  Physical Activity: Not on file  Stress: Not on file  Social Connections: Not on file    Review of Systems: A 12 point ROS discussed and pertinent positives are indicated in the HPI above.  All other systems are negative.  Review of Systems  Vital Signs: BP (!) 138/58 (BP Location: Right Arm)    Pulse 74    Temp 98.1 F (36.7 C) (Oral)    Resp 17    Ht 5\' 5"  (1.651 m)    Wt 147 lb 14.9 oz (67.1 kg)    SpO2 96%    BMI 24.62 kg/m   Physical Exam Constitutional:      General: She is not in acute distress.    Appearance: She is ill-appearing.  HENT:     Head: Normocephalic and atraumatic.     Nose: Nose normal.     Mouth/Throat:     Mouth: Mucous membranes are dry.     Pharynx: Oropharynx is clear.  Eyes:     Extraocular Movements: Extraocular movements intact.  Cardiovascular:     Rate and Rhythm: Normal rate.      Pulses: Normal pulses.  Pulmonary:     Effort: Pulmonary effort is normal.     Breath sounds: Normal breath sounds.  Abdominal:     Palpations: Abdomen is soft.  Skin:    General: Skin is warm and dry.     Coloration: Skin is pale.  Neurological:     General: No focal deficit present.     Mental Status: She is alert and oriented to person, place, and time.  Psychiatric:        Thought Content: Thought content normal.        Judgment: Judgment normal.    Imaging: DG Chest 2 View  Result Date: 05/03/2021 CLINICAL DATA:  Weakness history of breast mass, initial encounter EXAM: CHEST -  2 VIEW COMPARISON:  11/09/2017 FINDINGS: Check shadow is mildly enlarged. The lungs are well aerated bilaterally. There is a 3.9 x 3.0 cm rounded soft tissue mass lesion projecting in the posterior aspect of the right upper lobe along the major fissure. This was not seen on the prior exam and is felt to represent a primary pulmonary neoplasm till proven otherwise. No bony abnormality is noted. IMPRESSION: Right upper lobe mass. CT of the chest with contrast is recommended for further evaluation. Electronically Signed   By: Inez Catalina M.D.   On: 05/03/2021 00:09   CT Head Wo Contrast  Result Date: 05/03/2021 CLINICAL DATA:  75 year old female with headache weakness, decreased P.O. Right side flank pain for 3 months. EXAM: CT HEAD WITHOUT CONTRAST TECHNIQUE: Contiguous axial images were obtained from the base of the skull through the vertex without intravenous contrast. COMPARISON:  Head CT 06/02/2015. FINDINGS: Brain: Cerebral volume is within normal limits for age. No midline shift, ventriculomegaly, mass effect, evidence of mass lesion, intracranial hemorrhage or evidence of cortically based acute infarction. Gray-white matter differentiation is within normal limits throughout the brain. Vascular: Mild Calcified atherosclerosis at the skull base. No suspicious intracranial vascular hyperdensity. Skull:  Stable, negative. Sinuses/Orbits: Visualized paranasal sinuses and mastoids are clear. Tympanic cavities are clear. Other: Visualized orbits and scalp soft tissues are within normal limits. IMPRESSION: Normal for age noncontrast Head CT. Electronically Signed   By: Genevie Ann M.D.   On: 05/03/2021 04:49   CT CHEST W CONTRAST  Result Date: 05/03/2021 CLINICAL DATA:  Concern for lung nodule.  Abnormal x-ray EXAM: CT CHEST WITH CONTRAST TECHNIQUE: Multidetector CT imaging of the chest was performed during intravenous contrast administration. CONTRAST:  93mL OMNIPAQUE IOHEXOL 300 MG/ML  SOLN COMPARISON:  Chest x-ray earlier the same day FINDINGS: Cardiovascular: Heart is enlarged. No pericardial effusion identified. Main pulmonary artery is normal caliber. Thoracic aorta is normal caliber with mild atherosclerotic plaques. Mediastinum/Nodes: A few enlarged mediastinal lymph nodes measuring up to 11 mm in short axis in the superior mediastinum and 10 mm in short axis precarinal. Enlarged right hilar lymph node measuring 11 mm in short axis. No axillary lymphadenopathy identified. Subcentimeter right thyroid lobe nodule noted. Lungs/Pleura: Lungs are hyperinflated with mild emphysematous changes and mild hazy mosaic attenuation throughout. There is a solid centrally necrotic mass identified in the posterior aspect of the right upper lobe which measures 2.7 x 3.8 x 2.8 cm and partially abuts the major fissure and the posterolateral pleura. The mass demonstrates lobulated slightly irregular borders. 6 mm pleural-based pulmonary nodule in the posterior aspect of the right upper lobe, superior to the mass. A few small adjacent pulmonary nodular densities in the left upper lobe subpleural measuring up to 4 mm in size. Small right and trace left pleural effusions. No pneumothorax. Upper Abdomen: No acute process identified in the visualized upper abdomen. Nodular contour of the liver suggesting cirrhosis. At least 3 cystic  density lesions in the pancreas measuring up to 16 mm in the tail the pancreas. Musculoskeletal: Multiple lytic likely metastatic bone lesions identified in the spine and ribs. Largest lytic lesion is in the T11 vertebral body which is expansile and extends 8 mm dorsal to the posterior vertebral body margin and causes moderate to severe spinal canal stenosis. IMPRESSION: 1. Solid and centrally necrotic lung mass in the right upper lobe as described, consistent with primary malignancy. 2. Emphysematous changes of the lungs. A few additional small pulmonary nodules bilaterally. 3. Small right and  trace left pleural effusions. 4. Mediastinal and right hilar mild adenopathy, possibly metastatic. 5. Multiple lytic osseous bone metastases. Most notably a dorsally expansile lesion in the T11 vertebral body which causes moderate to severe spinal canal stenosis. Correlate clinically for need for MRI evaluation. 6. Chronic findings in the abdomen including hepatic cirrhosis and at least 3 hypodense cystic lesions in the pancreas which may represent cysts, pseudocysts, or side branch IPMN. Aortic Atherosclerosis (ICD10-I70.0) and Emphysema (ICD10-J43.9). Electronically Signed   By: Ofilia Neas M.D.   On: 05/03/2021 10:34   CT ABDOMEN PELVIS W CONTRAST  Result Date: 05/03/2021 CLINICAL DATA:  75 year old female with headache weakness, decreased P.O. Right side flank pain for 3 months. 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 EXAM: CT ABDOMEN AND PELVIS WITH CONTRAST TECHNIQUE: Multidetector CT imaging of the abdomen and pelvis was performed using the standard protocol following bolus administration of intravenous contrast. CONTRAST:  186mL OMNIPAQUE IOHEXOL 300 MG/ML  SOLN COMPARISON:  Chest CT 11/10/2017. FINDINGS: Lower chest: Small layering pleural effusions are new since 2019, larger on the right with simple fluid density. No pericardial effusion. Cardiac size now  at the upper limits of normal. Mild lung base atelectasis. Hepatobiliary: Coarse, heterogeneous liver enhancement is new since 2019. This has an infiltrative appearance, without a discrete liver mass or lesion. Gallbladder remains within normal limits. Pancreas: Small circumscribed simple fluid density cystic mass at the tail of the pancreas is 18 mm on series 5, image 27. Elsewhere the pancreatic parenchyma appears somewhat atrophied. No regional inflammation. Spleen: Negative. Adrenals/Urinary Tract: Normal adrenal glands. Symmetric renal enhancement and contrast excretion. Distended but otherwise unremarkable bladder. Stomach/Bowel: Redundant but decompressed large bowel in the abdomen and pelvis. There is retained stool in the right colon. Normal appendix on coronal image 45. Cecum appears mildly distended with contrast. Terminal ileum is decompressed. No dilated small bowel. Negative stomach and duodenum. No free air. No abdominal free fluid. Vascular/Lymphatic: Aortoiliac calcified atherosclerosis. Major arterial structures in the abdomen and pelvis are patent. Portal venous system is patent. No lymphadenopathy. Reproductive: Small uterine fibroids, otherwise within normal limits. Other: There is trace free fluid in the cul-de-sac on series 5, image 73. Musculoskeletal: Lytic lesion with posterior expansion, retropulsion of tumor from the T11 vertebral body (sagittal image 58). Mild to moderate associated spinal stenosis there (series 5, image 19). Mild associated T11 loss of height, compression. Right side T8 vertebral body roughly 11 mm lytic lesion is also new since 2019 on sagittal image 57. Similar lytic lesions in the L4 and S1 bodies. Visible ribs appear intact. There is a 2 cm lytic lesion of the medial left iliac bone with probable early extraosseous extension on series 5, image 60. Elsewhere the pelvis appears intact. IMPRESSION: 1. Fairly widespread osseous metastatic disease, with lytic lesions in  the spine and pelvis. Pathologic T11 compression fracture with epidural extension of tumor resulting in mild to moderate spinal stenosis. If there are myelopathic symptoms consider T11 cord compression. 2. Heterogeneous enhancement of the liver is new since 2019. The appearance is infiltrative and in light of #1 hepatic metastatic disease cannot be excluded although no discrete liver mass. Alternatively consider hepatitis or other intrinsic hepatocellular disease. 3. Small layering pleural effusions. Trace free fluid in the pelvis. 4. Small 1.8 cm cystic mass at the tail of the pancreas, with some background pancreatic atrophy. This is indeterminate and meets consensus criteria for re-imaging q6 months x4 to evaluate growth versus stability.  5. Aortic Atherosclerosis (ICD10-I70.0). Electronically Signed   By: Genevie Ann M.D.   On: 05/03/2021 04:59   DG CHEST PORT 1 VIEW  Result Date: 05/08/2021 CLINICAL DATA:  Bronchoscopy yesterday, cough EXAM: PORTABLE CHEST 1 VIEW COMPARISON:  Chest radiograph 1 day prior, CT chest 05/03/2021 FINDINGS: The cardiomediastinal silhouette is stable. The approximally 4.6 cm mass in the right upper lobe is unchanged. Opacities surrounding this mass seen on the prior radiograph are decreased in conspicuity. There is no new or worsening focal airspace disease. There is no pleural effusion. There is no pneumothorax. The bones are stable. IMPRESSION: Interval decreased in conspicuity of patchy opacities surrounding the right upper lobe mass compared to the study from 1 day prior. No new or worsening focal airspace disease. No pneumothorax. Electronically Signed   By: Valetta Mole M.D.   On: 05/08/2021 08:45   DG CHEST PORT 1 VIEW  Result Date: 05/07/2021 CLINICAL DATA:  Status post bronchoscopy EXAM: PORTABLE CHEST 1 VIEW COMPARISON:  05/02/2021 FINDINGS: New patchy alveolar densities seen in the right upper lung fields. Previously seen 4 cm nodular density in the right upper lung  fields is obscured by the new infiltrate. Findings suggest possible pulmonary hemorrhage related to bronchoscopic biopsy. Rest of the lung fields are unremarkable. There is no pleural effusion or pneumothorax. IMPRESSION: New alveolar infiltrate is seen in the right upper lung fields possibly suggesting pulmonary hemorrhage related to bronchoscopic biopsy. Electronically Signed   By: Elmer Picker M.D.   On: 05/07/2021 12:00    Labs:  CBC: Recent Labs    05/07/21 0634 05/08/21 0605 05/09/21 0604 05/10/21 0724  WBC 13.8* 13.5* 11.7* 8.9  HGB 7.9* 7.9* 7.7* 7.4*  HCT 26.4* 26.4* 26.1* 25.2*  PLT 518* 488* 444* PLATELET CLUMPS NOTED ON SMEAR, UNABLE TO ESTIMATE    COAGS: No results for input(s): INR, APTT in the last 8760 hours.  BMP: Recent Labs    05/06/21 0256 05/07/21 0634 05/08/21 0605 05/10/21 0724  NA 134* 135 135 135  K 4.1 3.2* 3.5 3.4*  CL 102 97* 96* 98  CO2 22 27 26 27   GLUCOSE 128* 136* 176* 121*  BUN 11 9 10  7*  CALCIUM 8.3* 8.6* 8.2* 8.5*  CREATININE 0.68 0.66 0.86 0.67  GFRNONAA >60 >60 >60 >60    LIVER FUNCTION TESTS: Recent Labs    05/06/21 0256 05/07/21 0634 05/08/21 0605 05/10/21 0724  BILITOT QUANTITY NOT SUFFICIENT, UNABLE TO PERFORM TEST 0.7 0.6 0.6  AST 18 10* 19 13*  ALT 9 10 13 11   ALKPHOS 104 102 99 84  PROT 5.6* 6.0* 5.7* 5.7*  ALBUMIN 2.6* 2.7* 2.5* 2.3*    TUMOR MARKERS: No results for input(s): AFPTM, CEA, CA199, CHROMGRNA in the last 8760 hours.  Assessment and Plan:  75 year old female with history of breast cancer is found to have right upper lobe lung mass not fully biopsied by bronchoscopy guidance.  IR is planning to do [place] assisted right lung mass biopsy on Tuesday, May 14, 2021.  Will make NPO the night prior.  Risks and benefits of a lung biopsy was discussed with the patient and/or patient's family including, but not limited to bleeding, infection, damage to adjacent structures or low yield requiring additional  tests.  All of the questions were answered and there is agreement to proceed.  Consent signed and in chart.   Thank you for this interesting consult.  I greatly enjoyed meeting Robin Estrada and look forward to participating  in their care.  A copy of this report was sent to the requesting provider on this date.  Electronically Signed: Pasty Spillers, PA 05/10/2021, 2:36 PM   I spent a total of 40 Minutes in face to face in clinical consultation, greater than 50% of which was counseling/coordinating care for lung biopsy

## 2021-05-10 NOTE — Progress Notes (Signed)
OT Cancellation Note  Patient Details Name: Robin Estrada MRN: [id number] DOB: [birth year]   Cancelled Treatment:    Reason Eval/Treat Not Completed: Patient declined, no reason specified. Pt on phone, requesting therapy come back later. Will follow up as schedule allows.  Lynnda Child, OTD, OTR/L Acute Rehab [phone]   Kaylyn Lim 05/10/2021, 1:33 PM

## 2021-05-11 DIAGNOSIS — I5033 Acute on chronic diastolic (congestive) heart failure: Secondary | ICD-10-CM | POA: Diagnosis not present

## 2021-05-11 DIAGNOSIS — I10 Essential (primary) hypertension: Secondary | ICD-10-CM | POA: Diagnosis not present

## 2021-05-11 DIAGNOSIS — K922 Gastrointestinal hemorrhage, unspecified: Secondary | ICD-10-CM | POA: Diagnosis not present

## 2021-05-11 DIAGNOSIS — D500 Iron deficiency anemia secondary to blood loss (chronic): Secondary | ICD-10-CM | POA: Diagnosis not present

## 2021-05-11 NOTE — Progress Notes (Signed)
Pt due for atenolol 100mg  tonight  but last two sets of vitals were  BP: 122/41 pulse 81 @ 19757 BP: 137/55 pulse 76 @ 2315 On-call provider paged to advise if it's ok to still give the atenolol since pt's diastolic is less than 60.  Per verbal order from on call provider Lovey Newcomer, NP it's ok to still give pt her scheduled atenolol.

## 2021-05-11 NOTE — Progress Notes (Signed)
[facility] Progress Note  Patient: Robin Estrada    KWI:[id number]  DOA: 05/02/2021    Date of Service: the patient was seen and examined on 05/11/2021  Brief hospital course: 75 year old Robin Estrada female with history of breast cancer and hypertension admitted on 12/22 with complaints of anorexia and weight loss x3 months and found to have hemoglobin at 6.9 with Hemoccult positive.  CT of abdomen pelvis with contrast notes a cystic lesion of tail of pancreas with infiltrative disease of the liver and chest x-ray notes right upper lobe mass.  Admitted to hospital service.  GI consulted and patient underwent EGD on 12/24, noting gastritis and ulcer, not consistent with level of hemoglobin.  Palliative care consulted and patient is understanding of her overall condition and likely diagnosis and prognosis.  For now, she wishes to be a full code.  Initially refused blood transfusion, but after discussion with son, amenable.  Patient received 1 unit packed red blood cells on the night of 12/26.  Also has received IV iron.  Underwent bronchoscopy on the morning of 12/27 with multiple areas biopsied however pathology came back unremarkable in these areas.  The largest mass in the hilum unable to be biopsied.  Patient underwent colonoscopy on 12/29.  Several polyps removed, but no masses for active areas of bleeding noted.  Current plan is for ultrasound-guided biopsy by interventional radiology on Tuesday, 1/3.  Assessment and Plan: Cardiovascular and Mediastinum Acute on chronic diastolic CHF (congestive heart failure) (HCC) Assessment & Plan Noted on echocardiogram from 3 years ago.  Given hypertensive urgency issues, suspect that blood transfusion has led to increased volume and volume overload.  BNP at 404.  Have started gentle IV Lasix and checking BNP in the morning.  Essential hypertension Assessment & Plan Blood pressure slightly elevated secondary to volume overload.  BNP of 404.   Gentle IV Lasix..  Continue metoprolol.  Also on as needed clonidine and once a week Maxide.   Digestive Cystic mass of pancreas Assessment & Plan CA 19-9 level normal.  Her back pain could be from thoracic compression fracture or from pancreatitis.  Checking lipase as well  * GI bleed Assessment & Plan Unclear etiology.  Findings on EGD not proportionate for blood loss.  Colonoscopy possibly tomorrow depending on when bronchoscopy is.  Otherwise on Wednesday.  Musculoskeletal and Integument Pathologic compression fracture of thoracic vertebra [facility]) Assessment & Plan As needed hydrocodone.  PT recommends outpatient PT  Hematopoietic and Hemostatic Thrombocytosis Assessment & Plan Likely reactive  Other Leukocytosis Assessment & Plan Suspect stress margination.  No antibiotics and white blood cell count down to 11 on the following day.  Slightly elevated today.  Hypokalemia Assessment & Plan Replace as needed  Hypoalbuminemia Assessment & Plan Decreased p.o. intake likely in the setting of malignancy.  Nutrition to see  Lung mass Assessment & Plan Possible primary versus metastatic disease.  For bronchoscopy on Tuesday  Acute blood loss anemia Assessment & Plan Patient initially was refusing blood transfusion, only excepting IV iron.  After discussion with son, now amenable.  Underwent transfusion of 1 unit on 12/26.  Continue to monitor hemoglobin.     Body mass index is 24.62 kg/m.  Nutrition Problem: Inadequate oral intake Etiology: poor appetite     Consultants: Gastroenterology Critical care/pulmonary Palliative care Interventional radiology  Procedures: Planned colonoscopy 12/28 Status post bronchoscopy 12/27 with multiple biopsies.  Results are pending. Status post EGD Planned ultrasound-guided biopsy for 1/3 by interventional radiology  Antimicrobials: None  Code Status: Full code   Subjective: Complaining of some lower back  pain  Objective: Vitals:   05/11/21 0806 05/11/21 1321  BP: (!) 124/41 (!) 135/55  Pulse: 77 75  Resp: 17 17  Temp: 98.3 F (36.8 C) 98.2 F (36.8 C)  SpO2: 96% 99%    Intake/Output Summary (Last 24 hours) at 05/11/2021 1441 Last data filed at 05/11/2021 0900 Gross per 24 hour  Intake 120 ml  Output --  Net 120 ml    Filed Weights   05/04/21 0945 05/07/21 0815 05/09/21 1117  Weight: 67.1 kg 67.1 kg 67.1 kg   Body mass index is 24.62 kg/m.  Exam:  General: Awake and alert, mild distress from lower back pain, emaciated HEENT: Normocephalic atraumatic, mucous membranes are moist Cardiovascular: Regular rate and rhythm, S1-S2 Respiratory: Decreased breath sounds throughout, no wheezing Abdomen: Soft, nontender, nondistended, positive bowel sounds Musculoskeletal: No clubbing or cyanosis or edema Skin: No skin break, tears or lesions Psychiatry: Appropriate, no evidence of psychoses Neurology: No focal deficits  Data Reviewed: Last labs from 12/30 reviewed.  Disposition:  Status is: Inpatient  Remains inpatient appropriate because: Work-up for lung mass pending.   Family Communication: Updated son by phone.  DVT Prophylaxis: SCDs Start: 05/03/21 1610    Author: Annita Brod ,MD 05/11/2021 2:41 PM  To reach On-call, see care teams to locate the attending and reach out via [email or web address]. Between 7PM-7AM, please contact night-coverage If you still have difficulty reaching the attending provider, please page the [facility] (Director on Call) for [facility] on amion for assistance.

## 2021-05-12 ENCOUNTER — Encounter (HOSPITAL_COMMUNITY): Payer: Self-pay | Admitting: Gastroenterology

## 2021-05-12 DIAGNOSIS — I10 Essential (primary) hypertension: Secondary | ICD-10-CM | POA: Diagnosis not present

## 2021-05-12 DIAGNOSIS — I5033 Acute on chronic diastolic (congestive) heart failure: Secondary | ICD-10-CM | POA: Diagnosis not present

## 2021-05-12 DIAGNOSIS — D62 Acute posthemorrhagic anemia: Secondary | ICD-10-CM | POA: Diagnosis not present

## 2021-05-12 DIAGNOSIS — M4854XA Collapsed vertebra, not elsewhere classified, thoracic region, initial encounter for fracture: Secondary | ICD-10-CM | POA: Diagnosis not present

## 2021-05-12 DIAGNOSIS — T402X5A Adverse effect of other opioids, initial encounter: Secondary | ICD-10-CM | POA: Insufficient documentation

## 2021-05-12 DIAGNOSIS — K5903 Drug induced constipation: Secondary | ICD-10-CM | POA: Insufficient documentation

## 2021-05-12 LAB — CBC
HCT: 23.5 % — ABNORMAL LOW (ref 36.0–46.0)
Hemoglobin: 7.1 g/dL — ABNORMAL LOW (ref 12.0–15.0)
MCH: 22.5 pg — ABNORMAL LOW (ref 26.0–34.0)
MCHC: 30.2 g/dL (ref 30.0–36.0)
MCV: 74.6 fL — ABNORMAL LOW (ref 80.0–100.0)
Platelets: 459 10*3/uL — ABNORMAL HIGH (ref 150–400)
RBC: 3.15 MIL/uL — ABNORMAL LOW (ref 3.87–5.11)
RDW: 20.4 % — ABNORMAL HIGH (ref 11.5–15.5)
WBC: 12.8 10*3/uL — ABNORMAL HIGH (ref 4.0–10.5)
nRBC: 0 % (ref 0.0–0.2)

## 2021-05-12 LAB — COMPREHENSIVE METABOLIC PANEL
ALT: 12 U/L (ref 0–44)
AST: 13 U/L — ABNORMAL LOW (ref 15–41)
Albumin: 2.4 g/dL — ABNORMAL LOW (ref 3.5–5.0)
Alkaline Phosphatase: 85 U/L (ref 38–126)
Anion gap: 8 (ref 5–15)
BUN: 9 mg/dL (ref 8–23)
CO2: 26 mmol/L (ref 22–32)
Calcium: 8.2 mg/dL — ABNORMAL LOW (ref 8.9–10.3)
Chloride: 99 mmol/L (ref 98–111)
Creatinine, Ser: 0.71 mg/dL (ref 0.44–1.00)
GFR, Estimated: >60 mL/min (ref >60)
Glucose, Bld: 144 mg/dL — ABNORMAL HIGH (ref 70–99)
Potassium: 3.8 mmol/L (ref 3.5–5.1)
Sodium: 133 mmol/L — ABNORMAL LOW (ref 135–145)
Total Bilirubin: 0.6 mg/dL (ref 0.3–1.2)
Total Protein: 5.8 g/dL — ABNORMAL LOW (ref 6.5–8.1)

## 2021-05-12 LAB — BRAIN NATRIURETIC PEPTIDE: B Natriuretic Peptide: 729 pg/mL — ABNORMAL HIGH (ref 0.0–100.0)

## 2021-05-12 MED ORDER — POLYETHYLENE GLYCOL 3350 17 G PO PACK
17.0000 g | PACK | Freq: Every day | ORAL | Status: DC
  Administered 2021-05-12 – 2021-05-25 (×4): 17 g via ORAL
  Filled 2021-05-12 (×12): qty 1

## 2021-05-12 MED ORDER — FUROSEMIDE 10 MG/ML IJ SOLN
40.0000 mg | Freq: Two times a day (BID) | INTRAMUSCULAR | Status: DC
  Administered 2021-05-12 – 2021-05-13 (×3): 40 mg via INTRAVENOUS
  Filled 2021-05-12 (×4): qty 4

## 2021-05-12 NOTE — Assessment & Plan Note (Signed)
Patient complains of some abdominal discomfort.  She has not had a bowel movement since her colonoscopy which was done 4 days ago.  She normally has a bowel movement every day.  Have started MiraLAX and advised her to let us know if this did not work and we can go with something stronger.

## 2021-05-12 NOTE — Progress Notes (Addendum)
[facility] Progress Note  Patient: Robin Estrada    FOY:[id number]  DOA: 05/02/2021    Date of Service: the patient was seen and examined on 05/12/2021  Brief hospital course: 76 year old [place] female with history of breast cancer and hypertension admitted on 12/22 with complaints of anorexia and weight loss x3 months and found to have hemoglobin at 6.9 with Hemoccult positive.  CT of abdomen pelvis with contrast notes a cystic lesion of tail of pancreas with infiltrative disease of the liver and chest x-ray notes right upper lobe mass.  Admitted to hospital service.  GI consulted and patient underwent EGD on 12/24, noting gastritis and ulcer, not consistent with level of hemoglobin.  Palliative care consulted and patient is understanding of her overall condition and likely diagnosis and prognosis.  For now, she wishes to be a full code.  Initially refused blood transfusion, but after discussion with son, amenable.  Patient received 1 unit packed red blood cells on the night of 12/26.  Also has received IV iron.  Underwent bronchoscopy on the morning of 12/27 with multiple areas biopsied however pathology came back unremarkable in these areas.  The largest mass in the hilum unable to be biopsied.  Patient underwent colonoscopy on 12/29.  Several polyps removed, but no masses for active areas of bleeding noted.  Current plan is for ultrasound-guided biopsy by interventional radiology on Tuesday, 1/3.  Hospital course noted for some volume overload.  Assessment and Plan: Cardiovascular and Mediastinum Acute on chronic diastolic CHF (congestive heart failure) (HCC) Assessment & Plan Noted on echocardiogram from 3 years ago.  Given hypertensive urgency issues, suspect that blood transfusion has led to increased volume and volume overload.  BNP at 404.  Have started gentle IV Lasix and checking BNP in the morning.  Essential hypertension Assessment & Plan Blood pressure slightly  elevated secondary to volume overload.  BNP of 404.  Gentle IV Lasix..  Continue metoprolol.  Also on as needed clonidine and once a week Maxide.   Digestive Cystic mass of pancreas Assessment & Plan CA 19-9 level normal.  Her back pain could be from thoracic compression fracture or from pancreatitis.  Checking lipase as well  * GI bleed Assessment & Plan Unclear etiology.  Findings on EGD not proportionate for blood loss.  Colonoscopy possibly tomorrow depending on when bronchoscopy is.  Otherwise on Wednesday.  Opioid-induced constipation Assessment and plan Patient complains of some abdominal discomfort.  She has not had a bowel movement since her colonoscopy which was done 4 days ago.  She normally has a bowel movement every day.  Have started MiraLAX and advised her to let us know if this did not work and we can go with something stronger.  Musculoskeletal and Integument Pathologic compression fracture of thoracic vertebra [facility]) Assessment & Plan As needed hydrocodone.  PT recommends outpatient PT  Hematopoietic and Hemostatic Thrombocytosis Assessment & Plan Likely reactive  Other Leukocytosis Assessment & Plan Suspect stress margination.  No antibiotics and white blood cell count down to 11 on the following day.  Slightly elevated today.  Hypokalemia Assessment & Plan Replace as needed  Hypoalbuminemia Assessment & Plan Decreased p.o. intake likely in the setting of malignancy.  Nutrition to see  Lung mass Assessment & Plan Possible primary versus metastatic disease.  For bronchoscopy on Tuesday  Acute blood loss anemia Assessment & Plan Patient initially was refusing blood transfusion, only excepting IV iron.  After discussion with son, now amenable.  Underwent transfusion of 1 unit  on 12/26.  Continue to monitor hemoglobin.     Body mass index is 24.62 kg/m.  Nutrition Problem: Inadequate oral intake Etiology: poor appetite      Consultants: Gastroenterology Critical care/pulmonary Palliative care Interventional radiology  Procedures: Planned colonoscopy 12/28 Status post bronchoscopy 12/27 with multiple biopsies.  Results are pending. Status post EGD Planned ultrasound-guided biopsy for 1/3 by interventional radiology  Antimicrobials: None  Code Status: Full code   Subjective: Patient is back pain is okay, tolerable with pain medication.  Biggest complaint is some wheezing in the lower part of her lungs.  Objective: Vitals:   05/12/21 1423 05/12/21 2023  BP: (!) 122/44 (!) 135/52  Pulse: 75 72  Resp: 16 18  Temp: 97.9 F (36.6 C) 98.3 F (36.8 C)  SpO2: 96% 95%    Intake/Output Summary (Last 24 hours) at 05/12/2021 2048 Last data filed at 05/12/2021 1711 Gross per 24 hour  Intake 650 ml  Output --  Net 650 ml    Filed Weights   05/04/21 0945 05/07/21 0815 05/09/21 1117  Weight: 67.1 kg 67.1 kg 67.1 kg   Body mass index is 24.62 kg/m.  Exam:  General: Awake and alert, mild distress from lower back pain, emaciated HEENT: Normocephalic atraumatic, mucous membranes are moist Cardiovascular: Regular rate and rhythm, S1-S2 Respiratory: Bilateral end expiratory wheeze in both lung bases Abdomen: Soft, nontender, nondistended, positive bowel sounds Musculoskeletal: No clubbing or cyanosis or edema Skin: No skin break, tears or lesions Psychiatry: Appropriate, no evidence of psychoses Neurology: No focal deficits  Data Reviewed: Last labs from 12/30 reviewed.  Disposition:  Status is: Inpatient  Remains inpatient appropriate because: Work-up for lung mass pending.  Diuresing.   Family Communication: Updated son by phone.  DVT Prophylaxis: SCDs Start: 05/03/21 6203    Author: Annita Brod ,MD 05/12/2021 8:48 PM  To reach On-call, see care teams to locate the attending and reach out via [email or web address]. Between 7PM-7AM, please contact night-coverage If you still have  difficulty reaching the attending provider, please page the [facility] (Director on Call) for [facility] on amion for assistance.

## 2021-05-13 DIAGNOSIS — J439 Emphysema, unspecified: Secondary | ICD-10-CM

## 2021-05-13 DIAGNOSIS — R918 Other nonspecific abnormal finding of lung field: Secondary | ICD-10-CM | POA: Diagnosis not present

## 2021-05-13 DIAGNOSIS — I5033 Acute on chronic diastolic (congestive) heart failure: Secondary | ICD-10-CM | POA: Diagnosis not present

## 2021-05-13 DIAGNOSIS — I700 Atherosclerosis of aorta: Secondary | ICD-10-CM

## 2021-05-13 DIAGNOSIS — K862 Cyst of pancreas: Secondary | ICD-10-CM | POA: Diagnosis not present

## 2021-05-13 DIAGNOSIS — C7951 Secondary malignant neoplasm of bone: Principal | ICD-10-CM | POA: Insufficient documentation

## 2021-05-13 DIAGNOSIS — K7469 Other cirrhosis of liver: Secondary | ICD-10-CM

## 2021-05-13 MED ORDER — HYDROCODONE-ACETAMINOPHEN 5-325 MG PO TABS
1.0000 | ORAL_TABLET | Freq: Four times a day (QID) | ORAL | Status: DC | PRN
  Administered 2021-05-13 – 2021-05-26 (×20): 1 via ORAL
  Filled 2021-05-13 (×21): qty 1

## 2021-05-13 MED ORDER — MORPHINE SULFATE (PF) 4 MG/ML IV SOLN
4.0000 mg | Freq: Once | INTRAVENOUS | Status: AC
  Administered 2021-05-13: 4 mg via INTRAVENOUS
  Filled 2021-05-13: qty 1

## 2021-05-13 MED ORDER — ENSURE ENLIVE PO LIQD
237.0000 mL | Freq: Three times a day (TID) | ORAL | Status: DC
  Administered 2021-05-13 – 2021-05-19 (×7): 237 mL via ORAL

## 2021-05-13 MED ORDER — MORPHINE SULFATE (PF) 2 MG/ML IV SOLN
2.0000 mg | INTRAVENOUS | Status: DC | PRN
  Administered 2021-05-13 – 2021-05-20 (×7): 2 mg via INTRAVENOUS
  Filled 2021-05-13 (×8): qty 1

## 2021-05-13 NOTE — Assessment & Plan Note (Addendum)
--  await lung biopsy --per CT: Multiple lytic osseous bone metastases. Most notably a dorsally expansile lesion in the T11 vertebral body which causes moderate to severe spinal canal stenosis.

## 2021-05-13 NOTE — Assessment & Plan Note (Signed)
--  no treatment indicated

## 2021-05-13 NOTE — Progress Notes (Signed)
°  Progress Note Patient: Robin Estrada QVZ:[id number] DOB: 07-04-1945 DOA: 05/02/2021     10 DOS: the patient was seen and examined on 05/13/2021   Brief hospital course: 76 year old woman, Russian-speaking, PMH breast cancer, admitted 12/22 with history of anorexia and weight loss, found to have anemia, cystic lesion of the pancreas, infiltrative disease in the liver and right upper lobe mass.  Seen by GI, underwent EGD showing gastritis and ulcer, seen by palliative medicine with goals to remain full code, no chemotherapy, but would consider radiation.  Underwent bronchoscopy 12/27, multiple areas biopsied but pathology unrevealing.  Underwent colonoscopy 12/29.  Several polyps removed.  Current plans for CT-guided biopsy by interventional radiology 1/3.  Assessment and Plan * Lung mass- (present on admission) -- Concerning for primary lung cancer, also widely metastatic osseous disease noted.  Bronchoscopy specimens were unrevealing.  Plan for CT-guided biopsy by interventional radiology 1/3.  Osseous metastasis ([place]) --await lung biopsy --per CT: Multiple lytic osseous bone metastases. Most notably a dorsally expansile lesion in the T11 vertebral body which causes moderate to severe spinal canal stenosis. Correlate clinically for need for MRI evaluation.  Pathologic compression fracture of thoracic vertebra ([place])- (present on admission) --pain control. Continue Duragesic and Lortab --per CT: Pathologic T11 compression fracture with epidural extension of tumor resulting in mild to moderate spinal stenosis. If myelopathic symptoms develop consider T11 cord compression.   Acute on chronic diastolic CHF (congestive heart failure) ([place]) -- Does not appear volume overloaded at this point.  Can likely change to oral Lasix tomorrow.  Respiratory status appears stable.  Not hypoxic.  Emphysema of lung ([place]) --asymptomatic  Other cirrhosis of liver (HCC) --seen on CT, appears stable, consider  outpatient follow-up  Cystic mass of pancreas- (present on admission) -- CA 19-9 level normal. Consider MRI/MRCP after RUL mass evaluation.   Chronic blood loss anemia- (present on admission) -- Etiology unclear.  EGD was relatively unrevealing, showed a nonbleeding gastric ulcer and gastritis.  Colonoscopy was unrevealing. -- Suspect a component of chronic disease.  Monitor.  Aortic atherosclerosis ([place]) --no treatment indicated     Subjective:  Feels about the same as yesterday, ate ok, had sudden nausea this AM.  Gala Murdoch and examined with aid of [place] interpreter per hospital  Objective Vital signs were reviewed and unremarkable. Physical Exam Vitals reviewed.  Constitutional:      General: She is not in acute distress.    Appearance: She is not ill-appearing.  Cardiovascular:     Rate and Rhythm: Normal rate and regular rhythm.     Heart sounds: No murmur heard. Pulmonary:     Effort: No respiratory distress.     Breath sounds: No wheezing, rhonchi or rales.  Abdominal:     General: There is no distension.     Palpations: Abdomen is soft.     Tenderness: There is no abdominal tenderness.  Musculoskeletal:     Right lower leg: No edema.     Left lower leg: No edema.  Psychiatric:        Mood and Affect: Mood normal.        Behavior: Behavior normal.   Data Reviewed: CMP noted  BMP high 729, Hgb stable 7.1  Family Communication: updated son by telephone  Disposition: Status is: Inpatient  Remains inpatient appropriate because: lung biopsy planned  Time spent: 35 minutes  Author: Murray Hodgkins MD 05/13/2021 7:19 PM  For on call review [email or web address].

## 2021-05-13 NOTE — Assessment & Plan Note (Signed)
asymptomatic

## 2021-05-13 NOTE — Progress Notes (Signed)
Administered PRN 2 mg morphine at 2121 per patient request. Patient said, pain did not relieve much.On call Dr. Ree Kida, got one time dose 4 mg morphine, which is enough dose to manage patient's pain. 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

## 2021-05-13 NOTE — Assessment & Plan Note (Addendum)
Euvolemic-no longer on diuretics.

## 2021-05-13 NOTE — Assessment & Plan Note (Addendum)
-  seen on CT, appears stable, consider outpatient follow-up

## 2021-05-13 NOTE — Assessment & Plan Note (Addendum)
Due to chronic GI bleeding from gastric ulcer.  Hemoglobin stable after 2 units of PRBC transfusion and IV iron supplementation.  Avoid NSAIDs/aspirin. Marland Kitchen  PCP/oncology to monitor hemoglobin in the outpatient setting.

## 2021-05-13 NOTE — Progress Notes (Addendum)
Nutrition Follow-up  DOCUMENTATION CODES:   Not applicable  INTERVENTION:   Encourage good PO intake  Continue Multivitamin w/ minerals daily Increase Ensure Enlive to TID, each supplement provides 350 kcal and 20 grams of protein Discontinue 30 ml ProSource Plus   NUTRITION DIAGNOSIS:   Inadequate oral intake related to poor appetite as evidenced by per patient/family report. - Ongoing  GOAL:   Patient will meet greater than or equal to 90% of their needs - Ongoing   MONITOR:   PO intake, Supplement acceptance  REASON FOR ASSESSMENT:   Malnutrition Screening Tool    ASSESSMENT:   76 y.o. 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 In the ED, hemoglobin was 6.9 and stool guaiac noted to be positive. Imaging showed right upper lobe mass concerning for primary pulmonary neoplasm as well as diffuse metastatic osseous disease with T11 pathologic compression fracture. She declined blood products in the ED. Admitted to [facility].  12/27 - Bronchoscopy  12/29 - Colonoscopy   Video interpreter was used to speak with pt.   Pt reports that her appetite has been poor PTA and still is currently very poor. Pt reports that this morning she had an egg and some fruit, but is trying to eat. Pt reports that she is drinking her supplements as best as she can. Discussed increasing her Ensure to 3 per day since she is not eating as much; pt agreeable.    Per EMR, pt intake includes: 12/27: Breakfast 0 % 12/29: Dinner 75% 12/31: Breakfast 5% 01/01: Breakfast 5%, Dinner 50%  Pt reports that the last time that she weighed herself she was 146#. Pt reports that she had a lot of weight loss about 1 and half months prior to admission.   Plan for IR to complete  guided biopsy on 01/03.   Medications reviewed and include: Lasix, MVI, Protonix, Miralax Labs reviewed.  Diet Order:   Diet Order             Diet NPO time specified Except for: Sips with Meds  Diet effective midnight           Diet regular Room service appropriate? Yes; Fluid consistency: Thin  Diet effective now                   EDUCATION NEEDS:   No education needs have been identified at this time  Skin:  Skin Assessment: Reviewed RN Assessment  Last BM:  05/09/2021  Height:   Ht Readings from Last 1 Encounters:  05/09/21 5\' 5"  (1.651 m)    Weight:   Wt Readings from Last 1 Encounters:  05/09/21 67.1 kg    Ideal Body Weight:  56.8 kg  BMI:  Body mass index is 24.62 kg/m.  Estimated Nutritional Needs:   Kcal:  1700-1900  Protein:  85-95 grams  Fluid:  >1.7L    Nakkia Mackiewicz Louie Casa, RD, LDN Clinical Dietitian See Shea Evans for contact information.

## 2021-05-13 NOTE — Hospital Course (Addendum)
76 year old woman, Russian-speaking, PMH breast cancer, admitted 12/22 with history of anorexia and weight loss, found to have anemia, cystic lesion of the pancreas, infiltrative disease in the liver and right upper lobe mass.  Seen by GI, underwent EGD showing gastritis and ulcer, seen by palliative medicine with goals to remain full code, no chemotherapy, but would consider radiation.  Underwent bronchoscopy 12/27, multiple areas biopsied but pathology unrevealing.  Underwent colonoscopy 12/29.  Several polyps removed.  Status post CT-guided biopsy by interventional radiology 1/3.  Plan for MRI of the back to further assess T11 pathologic fracture to rule out epidural extension.  Repeat lab work in the morning, likely home in the next 48 hours, doing well with PT.

## 2021-05-13 NOTE — Progress Notes (Signed)
OT Cancellation Note  Patient Details Name: Leniyah Martell MRN: [id number] DOB: 1945/09/12   Cancelled Treatment:    Reason Eval/Treat Not Completed: Patient declined, no reason specified. Pt requesting therapy to come back later, pt on a phone call and wanting to finish eating.  Kaylyn Lim 05/13/2021, 1:34 PM

## 2021-05-13 NOTE — Progress Notes (Signed)
Physical Therapy Treatment Patient Details Name: Robin Estrada MRN: [id number] DOB: 12-May-1946 Today's Date: 05/13/2021   History of Present Illness 76 y.o. female presents to [facility] hospital on 05/02/2021 with complaints of weakness, DOE. 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 s/p bronchoscopy 12/27 with multiple areas biopsied however pathology came back unremarkable. Current plan is for ultrasound-guided biopsy by interventional radiology on Tuesday, 1/3. 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    PT Comments    Patient reluctantly agreeable to participate in PT session after max encouragement and explanation of role of PT/importance of mobility (walking more than to the bathroom). Pt more unsteady today needing Min guard assist for hallway ambulation with narrow BoS and scissoring gait x2. Declines using RW for support. Dizziness reported which improved. Encouraged walking to nursing station and in hallway twice per day with staff. Will follow.    Recommendations for follow up therapy are one component of a multi-disciplinary discharge planning process, led by the attending physician.  Recommendations may be updated based on patient status, additional functional criteria and insurance authorization.  Follow Up Recommendations  Outpatient PT     Assistance Recommended at Discharge PRN  Equipment Recommendations  None recommended by PT    Recommendations for Other Services       Precautions / Restrictions Precautions Precautions: Fall Restrictions Weight Bearing Restrictions: No     Mobility  Bed Mobility Overal bed mobility: Modified Independent                  Transfers Overall transfer level: Needs assistance Equipment used: None Transfers: Sit to/from Stand Sit to  Stand: Supervision           General transfer comment: Supervision for safety. Stood from [employer], reports some dizziness.    Ambulation/Gait Ambulation/Gait assistance: Min guard Gait Distance (Feet): 100 Feet Assistive device: None Gait Pattern/deviations: Step-through pattern;Decreased stride length;Narrow base of support;Scissoring Gait velocity: decreased     General Gait Details: Slow, unsteady gait with narrow boS and 2 instances of scissoring, declines using RW. 1 standing rest break. Fatigues.   Stairs             Wheelchair Mobility    Modified Rankin (Stroke Patients Only)       Balance Overall balance assessment: Mild deficits observed, not formally tested                                          Cognition Arousal/Alertness: Awake/alert Behavior During Therapy: WFL for tasks assessed/performed Overall Cognitive Status: Within Functional Limits for tasks assessed                                 General Comments: irritated with therapy stating she has already been up and that is why she is tired. Does not understand ramifications of being in bed for 10 days and how this will translate into home environment/lifestyle. Explained role and importance of PT/OT/mobility through son who interprets.        Exercises      General Comments General comments (skin integrity, edema, etc.): Son present during session. Translates for PT. Lengthy discussion with pt about role  of PT, mobility and importance of movement/exercise.      Pertinent Vitals/Pain Pain Assessment: No/denies pain    Home Living                          Prior Function            PT Goals (current goals can now be found in the care plan section) Progress towards PT goals: Not progressing toward goals - comment (due to consistent refusals)    Frequency    Min 3X/week      PT Plan Current plan remains appropriate    Co-evaluation               AM-PAC PT "6 Clicks" Mobility   Outcome Measure  Help needed turning from your back to your side while in a flat bed without using bedrails?: None Help needed moving from lying on your back to sitting on the side of a flat bed without using bedrails?: None Help needed moving to and from a bed to a chair (including a wheelchair)?: None Help needed standing up from a chair using your arms (e.g., wheelchair or bedside chair)?: None Help needed to walk in hospital room?: A Little Help needed climbing 3-5 steps with a railing? : A Little 6 Click Score: 22    End of Session   Activity Tolerance: Patient limited by fatigue Patient left: in bed;with call bell/phone within reach;with family/visitor present Nurse Communication: Mobility status PT Visit Diagnosis: Other abnormalities of gait and mobility (R26.89);Muscle weakness (generalized) (M62.81)     Time: 2947-6546 PT Time Calculation (min) (ACUTE ONLY): 17 min  Charges:  $Gait Training: 8-22 mins                     Marisa Severin, PT, DPT Acute Rehabilitation Services Pager [phone] Office [place] 05/13/2021, 3:09 PM

## 2021-05-13 NOTE — Assessment & Plan Note (Addendum)
CA 19-9 level normal. Per GI note on 1/9-this cystic lesion is low priority-MRI/MRCP can be considered in the future at the discretion of oncology.

## 2021-05-13 NOTE — Assessment & Plan Note (Addendum)
--   Concerning for primary lung cancer, also widely metastatic osseous disease noted.  Bronchoscopy specimens were unrevealing.  Status post CT-guided biopsy 1/3.

## 2021-05-13 NOTE — Assessment & Plan Note (Addendum)
Pain controlled with current narcotic regimen-evaluated by neurosurgery-Dr. Ellene Route on 1/5 with recommendations for palliative radiation.  She will pursue radiation therapy in the outpatient setting.  Patient remains neurologically intact without any signs of cord compression.

## 2021-05-13 NOTE — Progress Notes (Addendum)
Occupational Therapy Treatment Patient Details Name: Robin Estrada MRN: [id number] DOB: May 19, 1945 Today's Date: 05/13/2021   History of present illness 76 y.o. female presents to [facility] hospital on 05/02/2021 with complaints of weakness, DOE. 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 s/p bronchoscopy 12/27 with multiple areas biopsied however pathology came back unremarkable. Current plan is for ultrasound-guided biopsy by interventional radiology on Tuesday, 1/3. 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   OT comments  Pt improving towards goals, currently requiring mod I for ADLs, bed mobility, and transfers. Reviewed importance of mobilizing with pt while admitted, pt verbalized understanding, RN reports pt is up ad lib in room throughout the day. Pt is close to baseline level of function, will continue to follow acutely to maximize ADL and mobility independence. Pt presenting with impairments listed below, however anticipate safe d/c home with assistance and HHOT pending pt progress.   Recommendations for follow up therapy are one component of a multi-disciplinary discharge planning process, led by the attending physician.  Recommendations may be updated based on patient status, additional functional criteria and insurance authorization.    Follow Up Recommendations  Home health OT    Assistance Recommended at Discharge Set up Supervision/Assistance  Equipment Recommendations  Tub/shower seat    Recommendations for Other Services PT consult    Precautions / Restrictions Precautions Precautions: Fall Precaution Comments: anemic Restrictions Weight Bearing Restrictions: No       Mobility Bed Mobility Overal bed mobility: Modified Independent             General bed mobility comments: pt  left sitting EOB    Transfers Overall transfer level: Modified independent Equipment used: None Transfers: Sit to/from Stand Sit to Stand: Supervision           General transfer comment: Pt mod I in room, up in room ad lib     Balance Overall balance assessment: Mild deficits observed, not formally tested                                         ADL either performed or assessed with clinical judgement   ADL       Grooming: Wash/dry hands;Standing;Modified independent Grooming Details (indicate cue type and reason): completed standing at sink                 Toilet Transfer: Modified Independent;Ambulation;Regular Glass blower/designer Details (indicate cue type and reason): pt up in bathroom upon arrival Toileting- Clothing Manipulation and Hygiene: Modified independent;Sit to/from stand Toileting - Clothing Manipulation Details (indicate cue type and reason): completes pericare and clothing management     Functional mobility during ADLs: Modified independent      Extremity/Trunk Assessment Upper Extremity Assessment Upper Extremity Assessment: Overall WFL for tasks assessed   Lower Extremity Assessment Lower Extremity Assessment: Defer to PT evaluation        Vision   Vision Assessment?: No apparent visual deficits   Perception Perception Perception: Not tested   Praxis Praxis Praxis: Not tested    Cognition Arousal/Alertness: Awake/alert Behavior During Therapy: WFL for tasks assessed/performed Overall Cognitive Status: Within Functional Limits for tasks assessed  General Comments: irritated with therapy stating she has already been up and that is why she is tired. Does not understand ramifications of being in bed for 10 days and how this will translate into home environment/lifestyle. Explained role and importance of PT/OT/mobility through son who interprets.          Exercises      Shoulder Instructions       General Comments Pt agreeable to mobility within room, emphasized importance of continued mobility with pt as she is close to baseline    Pertinent Vitals/ Pain       Pain Assessment: Faces Pain Score: 4  Faces Pain Scale: Hurts little more Pain Location: low back Pain Descriptors / Indicators: Aching;Discomfort;Constant;Guarding;Grimacing Pain Intervention(s): Limited activity within patient's tolerance;Monitored during session;RN gave pain meds during session  Home Living                                          Prior Functioning/Environment              Frequency  Min 2X/week        Progress Toward Goals  OT Goals(current goals can now be found in the care plan section)  Progress towards OT goals: Progressing toward goals  Acute Rehab OT Goals Patient Stated Goal: return PLOF OT Goal Formulation: With patient Time For Goal Achievement: 05/22/21 Potential to Achieve Goals: Good ADL Goals Pt Will Perform Lower Body Dressing: Independently;sit to/from stand Pt Will Transfer to Toilet: Independently;ambulating;regular height toilet Pt Will Perform Tub/Shower Transfer: Independently;ambulating;shower seat  Plan Discharge plan remains appropriate;Frequency remains appropriate    Co-evaluation                 AM-PAC OT "6 Clicks" Daily Activity     Outcome Measure   Help from another person eating meals?: None Help from another person taking care of personal grooming?: None Help from another person toileting, which includes using toliet, bedpan, or urinal?: None Help from another person bathing (including washing, rinsing, drying)?: A Little Help from another person to put on and taking off regular upper body clothing?: None Help from another person to put on and taking off regular lower body clothing?: A Little 6 Click Score: 22    End of Session Equipment Utilized During Treatment: Gait belt  OT  Visit Diagnosis: Unsteadiness on feet (R26.81);Other abnormalities of gait and mobility (R26.89);Muscle weakness (generalized) (M62.81)   Activity Tolerance Patient tolerated treatment well   Patient Left in bed;with call bell/phone within reach;with nursing/sitter in room   Nurse Communication Mobility status        Time: 3893-7342 OT Time Calculation (min): 9 min  Charges: OT General Charges $OT Visit: 1 Visit OT Treatments $Self Care/Home Management : 8-22 mins  Lynnda Child, OTD, OTR/L Acute Rehab [phone]) [phone] - [place] 05/13/2021, 5:26 PM

## 2021-05-14 ENCOUNTER — Inpatient Hospital Stay (HOSPITAL_COMMUNITY): Payer: Medicare HMO

## 2021-05-14 DIAGNOSIS — C7951 Secondary malignant neoplasm of bone: Secondary | ICD-10-CM | POA: Diagnosis not present

## 2021-05-14 DIAGNOSIS — D120 Benign neoplasm of cecum: Secondary | ICD-10-CM

## 2021-05-14 DIAGNOSIS — K253 Acute gastric ulcer without hemorrhage or perforation: Secondary | ICD-10-CM

## 2021-05-14 DIAGNOSIS — K862 Cyst of pancreas: Secondary | ICD-10-CM

## 2021-05-14 DIAGNOSIS — D125 Benign neoplasm of sigmoid colon: Secondary | ICD-10-CM

## 2021-05-14 DIAGNOSIS — R918 Other nonspecific abnormal finding of lung field: Secondary | ICD-10-CM

## 2021-05-14 DIAGNOSIS — D500 Iron deficiency anemia secondary to blood loss (chronic): Secondary | ICD-10-CM | POA: Diagnosis not present

## 2021-05-14 DIAGNOSIS — I5033 Acute on chronic diastolic (congestive) heart failure: Secondary | ICD-10-CM | POA: Diagnosis not present

## 2021-05-14 LAB — BASIC METABOLIC PANEL
Anion gap: 10 (ref 5–15)
BUN: 10 mg/dL (ref 8–23)
CO2: 28 mmol/L (ref 22–32)
Calcium: 8.2 mg/dL — ABNORMAL LOW (ref 8.9–10.3)
Chloride: 93 mmol/L — ABNORMAL LOW (ref 98–111)
Creatinine, Ser: 0.8 mg/dL (ref 0.44–1.00)
GFR, Estimated: >60 mL/min (ref >60)
Glucose, Bld: 124 mg/dL — ABNORMAL HIGH (ref 70–99)
Potassium: 3.5 mmol/L (ref 3.5–5.1)
Sodium: 131 mmol/L — ABNORMAL LOW (ref 135–145)

## 2021-05-14 MED ORDER — BISACODYL 5 MG PO TBEC
10.0000 mg | DELAYED_RELEASE_TABLET | Freq: Once | ORAL | Status: AC
  Administered 2021-05-14: 10 mg via ORAL
  Filled 2021-05-14: qty 2

## 2021-05-14 MED ORDER — LIDOCAINE HCL 1 % IJ SOLN
INTRAMUSCULAR | Status: AC
  Administered 2021-05-15: 10 mL via INTRADERMAL
  Filled 2021-05-14: qty 10

## 2021-05-14 MED ORDER — FENTANYL CITRATE (PF) 100 MCG/2ML IJ SOLN
INTRAMUSCULAR | Status: AC
  Filled 2021-05-14: qty 4

## 2021-05-14 MED ORDER — MIDAZOLAM HCL 2 MG/2ML IJ SOLN
INTRAMUSCULAR | Status: AC | PRN
  Administered 2021-05-14: 1 mg via INTRAVENOUS
  Administered 2021-05-14 (×2): .5 mg via INTRAVENOUS

## 2021-05-14 MED ORDER — LIDOCAINE HCL 1 % IJ SOLN
INTRAMUSCULAR | Status: AC
  Administered 2021-05-15: 10 mL via INTRAMUSCULAR
  Filled 2021-05-14: qty 10

## 2021-05-14 MED ORDER — FENTANYL CITRATE (PF) 100 MCG/2ML IJ SOLN
INTRAMUSCULAR | Status: AC | PRN
  Administered 2021-05-14 (×2): 25 ug via INTRAVENOUS

## 2021-05-14 MED ORDER — MIDAZOLAM HCL 2 MG/2ML IJ SOLN
INTRAMUSCULAR | Status: AC
  Filled 2021-05-14: qty 4

## 2021-05-14 NOTE — Assessment & Plan Note (Addendum)
--  EGD 12/24: 4 cm HH.  Clean-based, non-bleeding GU, erosive gastritis. Biopsy was negative for malignancy and H. pylori.  GI recommending Protonix twice daily for 8 weeks, followed by daily indefinitely.

## 2021-05-14 NOTE — Progress Notes (Signed)
Patient found on the bathroom floor laying in her faeces right after report during patients rounds. According to her by using interpreter, she was using the bathing and she "pass out" for few seconds. She tried to get up by herself and sit back on the toilet but was weak. Patient stated she did not hit her head or any part of her body.  Denies pain as well. BP (!) 136/49 (BP Location: Right Wrist)    Pulse 82    Temp 98.8 F (37.1 C) (Oral)    Resp 17    Ht 5\' 5"  (1.651 m)    Wt 67.1 kg    SpO2 94%    BMI 24.62 kg/m  Made comfortable in bed and instructed her to use the call bell for help if she needs to get up to do anything. Verbalized understanding. Will keep monitoring.

## 2021-05-14 NOTE — Procedures (Signed)
Interventional Radiology Procedure Note  Procedure: CT Guided Biopsy of RUL lung mass  Complications: None  Estimated Blood Loss: < 10 mL  Findings: 18 G core biopsy of RUL mass performed under CT guidance.  Approach to mass very difficult due to overlying scapula and ribs. Single core sample obtained and sent to Pathology. Immediate small PTX noted. Most of pleural air able to be aspirated via outer needle. Trace PTX on completion. Due to PTX, only single core biopsy sample obtained.  Venetia Night. Kathlene Cote, M.D Pager:  [phone]

## 2021-05-14 NOTE — Progress Notes (Addendum)
Daily Rounding Note  05/14/2021, 9:33 AM  LOS: 11 days   SUBJECTIVE:   Chief complaint:    anemia.  Lung mass.  Bone mets.   Back pain overall better, no back pain currently.  No abdominal pain.  Hungry/thirsty and complaining of dry mouth as she is n.p.o. for IR procedure today. Tolerating solids, last meal was 5 PM yesterday.  Last bowel movement was 3 days ago. Asking me if I know the time for the CT-guided lung biopsy.  OBJECTIVE:         Vital signs in last 24 hours:    Temp:  [98 F (36.7 C)-98.6 F (37 C)] 98.6 F (37 C) (01/03 0806) Pulse Rate:  [62-73] 73 (01/03 0806) Resp:  [16-19] 18 (01/03 0806) BP: (140-155)/(48-55) 155/48 (01/03 0806) SpO2:  [94 %-97 %] 97 % (01/03 0806) Last BM Date: 05/09/21 Filed Weights   05/04/21 0945 05/07/21 0815 05/09/21 1117  Weight: 67.1 kg 67.1 kg 67.1 kg   General: NAD.  Comfortable.  Does not look ill. Heart: RRR. Chest: Clear bilaterally.  No labored breathing or cough. Abdomen: Soft without tenderness.  Active bowel sounds.  No distention. Extremities: Feet are warm.  No CCE. Neuro/Psych: Calm, cooperative, fluid speech (conversation through Advertising account executive).    Intake/Output from previous day: 01/02 0701 - 01/03 0700 In: 860 [P.O.:860] Out: -   Intake/Output this shift: Total I/O In: 3 [I.V.:3] Out: -   Lab Results: Recent Labs    05/12/21 0643  WBC 12.8*  HGB 7.1*  HCT 23.5*  PLT 459*   BMET Recent Labs    05/12/21 0643 05/14/21 0655  NA 133* 131*  K 3.8 3.5  CL 99 93*  CO2 26 28  GLUCOSE 144* 124*  BUN 9 10  CREATININE 0.71 0.80  CALCIUM 8.2* 8.2*   LFT Recent Labs    05/12/21 0643  PROT 5.8*  ALBUMIN 2.4*  AST 13*  ALT 12  ALKPHOS 85  BILITOT 0.6   PT/INR No results for input(s): LABPROT, INR in the last 72 hours. Hepatitis Panel No results for input(s): HEPBSAG, HCVAB, HEPAIGM, HEPBIGM in the last 72  hours.  Studies/Results: No results found. ` Scheduled Meds:  atenolol  100 mg Oral BID   feeding supplement  237 mL Oral TID BM   fentaNYL  1 patch Transdermal Q72H   furosemide  40 mg Intravenous BID   lidocaine  1 patch Transdermal Q24H   multivitamin with minerals  1 tablet Oral Daily   pantoprazole  40 mg Oral BID AC   polyethylene glycol  17 g Oral Daily   sodium chloride flush  3 mL Intravenous Q12H   Continuous Infusions: PRN Meds:.acetaminophen **OR** acetaminophen, albuterol, hydrALAZINE, HYDROcodone-acetaminophen, morphine injection, ondansetron **OR** ondansetron (ZOFRAN) IV  ASSESMENT:     Anemia.  FOBT +.  S/p PRBCx 1. Ferrlecit infusion 12/24.   05/04/2021 EGD.  Nonobstructing Schatzki's ring.  4 cm HH.  Clean-based, nonbleeding, prepyloric gastric ulcer.  Erosive gastritis.  Duodenal pathology with no significant abnormality.  Gastric biopsies with no significant abnormality, no H. pylori. Protonix 40 mg po bid in place.  05/09/2021 colonoscopy.  Hot snare removal of 20 mm cecal polyp.  Hot snare removal 10 mm sigmoid polyp, site tattooed.  Left colon diverticulosis.   Lung mass, 05/07/21 bronch/biopsy/FNA cytology: no malignancy.    Plans for IR attempted ultrasound-guided biopsy today.  Cystic lesion pancreatic tail.  CA 19-9 normal.  Hepatic heterogeneity w infiltrative appearance per CT.  Back pain from metastatic disease.  Fentanyl, lidocaine patches in place.   PLAN   Resume solid food after procedure today.  Dulcolax this PM x 1 added to daily Miralax,   Await results of today's CT-guided lung biopsy.  At some point MRI/MRCP to evaluate pancreas but this is not urgent.    Azucena Freed  05/14/2021, 9:33 AM Phone [phone]  GI ATTENDING  Case reviewed with Dr. Fuller Plan.  Interval history and data reviewed.  Patient seen and examined.  Agree with interval progress note as outlined above.  Await lung biopsy (concern is metastatic lung cancer).  From  a GI standpoint, the patient is on PPI therapy for H. pylori negative ulcer disease.  No GI bleeding.  Has received iron replacement for IDA.  Large colon polyps resected.  Pathology pending.  Findings from both colonoscopy and upper endoscopy may certainly explain iron deficiency anemia.  No additional recommendations from GI standpoint.  We will follow-up pathology.  Docia Chuck. Geri [place]in[place]., M.D. [facility] Division of Gastroenterology

## 2021-05-14 NOTE — Assessment & Plan Note (Addendum)
S/p IV iron-start oral iron supplementation.  Suspect etiology to be chronic blood loss from her gastric ulcer.  Will need iron supplementation on discharge

## 2021-05-14 NOTE — Assessment & Plan Note (Signed)
--   from procedure 1/3.  Tiny per radiology.  Await x-ray.  Patient asymptomatic, no hypoxia.  Can likely manage expectantly with repeat imaging in the morning.

## 2021-05-14 NOTE — Care Management (Signed)
°  Transition of Care (TOC) Screening Note   Patient Details  Name: Robin Estrada Date of Birth: 06/13/1945   Transition of Care [facility]) CM/SW Contact:    Carles Collet, RN Phone Number: 05/14/2021, 8:24 AM    Transition of Care Department [facility]) has reviewed patient and we will continue to monitor patient advancement through interdisciplinary progression rounds.

## 2021-05-14 NOTE — Progress Notes (Signed)
Progress Note Patient: Robin Estrada HWE:[id number] DOB: December 21, 1945 DOA: 05/02/2021     11 DOS: the patient was seen and examined on 05/14/2021   Brief hospital course: 76 year old woman, Russian-speaking, PMH breast cancer, admitted 12/22 with history of anorexia and weight loss, found to have anemia, cystic lesion of the pancreas, infiltrative disease in the liver and right upper lobe mass.  Seen by GI, underwent EGD showing gastritis and ulcer, seen by palliative medicine with goals to remain full code, no chemotherapy, but would consider radiation.  Underwent bronchoscopy 12/27, multiple areas biopsied but pathology unrevealing.  Underwent colonoscopy 12/29.  Several polyps removed.  Status post CT-guided biopsy by interventional radiology 1/3.  Plan for MRI of the back to further assess T11 pathologic fracture to rule out epidural extension.  Repeat lab work in the morning, likely home in the next 48 hours, doing well with PT.  Assessment and Plan * Lung mass- (present on admission) -- Concerning for primary lung cancer, also widely metastatic osseous disease noted.  Bronchoscopy specimens were unrevealing.  Status post CT-guided biopsy 1/3.  Osseous metastasis ([place]) --await lung biopsy --per CT: Multiple lytic osseous bone metastases. Most notably a dorsally expansile lesion in the T11 vertebral body which causes moderate to severe spinal canal stenosis.  Pathologic compression fracture of thoracic vertebra ([place])- (present on admission) --pain controlled. Continue Duragesic and Lortab --per CT: Pathologic T11 compression fracture with epidural extension of tumor resulting in mild to moderate spinal stenosis.  No symptoms of myelopathy or cord compression.  Discussed with Dr. Kathyrn Sheriff neurosurgery, recommended MRI, no acute intervention warranted.  Acute on chronic diastolic CHF (congestive heart failure) (HCC) -- Does not appear volume overloaded.  Likely euvolemic.  Stop Lasix.  Resume  outpatient diuretic on discharge.  Emphysema of lung ([place]) --asymptomatic  Other cirrhosis of liver (HCC) --seen on CT, appears stable, consider outpatient follow-up  Acute gastric ulcer without hemorrhage or perforation- (present on admission) --EGD 12/24: 4 cm HH.  Clean-based, non-bleeding GU, erosive gastritis. Biopsy showed no significant abnormality of small bowel, negative H pylori, no inflammation/dysplasia or malignancy.  --PPI  Iron deficiency anemia- (present on admission) --received IV iron here, will start on oral iron on discharge  Cystic mass of pancreas- (present on admission) -- CA 19-9 level normal. Consider MRI/MRCP after RUL mass evaluation.   Chronic blood loss anemia- (present on admission) -- Etiology unclear but per GI, likely related to endoscopy findings.  EGD showed a nonbleeding gastric ulcer and gastritis.  Colonoscopy was unrevealing.  -- No aspirin, ibuprofen, naproxen, or other non-steroidal anti-inflammatory drugs for 2 weeks after polyp removal. -- Suspect a component of chronic disease.  Monitor.  Aortic atherosclerosis ([place]) --no treatment indicated     Subjective:  Feels okay today, no pain.  No bowel or bladder dysfunction.  No paresthesias or weakness of the lower extremities.  Interviewed and examined, condition and plan discussed the [place] interpreter [place] via iPad.  Objective Vital signs were reviewed and unremarkable. Physical Exam Constitutional:      General: She is not in acute distress.    Appearance: She is not ill-appearing or toxic-appearing.  Cardiovascular:     Rate and Rhythm: Normal rate and regular rhythm.     Heart sounds: No murmur heard. Pulmonary:     Effort: Pulmonary effort is normal. No respiratory distress.     Breath sounds: No wheezing, rhonchi or rales.  Musculoskeletal:     Right lower leg: No edema.     Left  lower leg: No edema.  Neurological:     Mental Status: She is alert.     Sensory: No  sensory deficit (legs).     Motor: No weakness (lifts both legs off the bed).  Psychiatric:        Mood and Affect: Mood normal.        Behavior: Behavior normal.   Data Reviewed: Sodium 131, chloride 93  Family Communication: son by telephone  Disposition: Status is: Inpatient  Remains inpatient appropriate because: MRI back, small pneumothorax         Time spent: 40 minutes  Author: Murray Hodgkins, MD 05/14/2021 4:14 PM  For on call review [email or web address].

## 2021-05-15 ENCOUNTER — Inpatient Hospital Stay (HOSPITAL_COMMUNITY): Payer: Medicare HMO

## 2021-05-15 DIAGNOSIS — R918 Other nonspecific abnormal finding of lung field: Secondary | ICD-10-CM | POA: Diagnosis not present

## 2021-05-15 LAB — CBC
HCT: 24.7 % — ABNORMAL LOW (ref 36.0–46.0)
Hemoglobin: 7.1 g/dL — ABNORMAL LOW (ref 12.0–15.0)
MCH: 21.2 pg — ABNORMAL LOW (ref 26.0–34.0)
MCHC: 28.7 g/dL — ABNORMAL LOW (ref 30.0–36.0)
MCV: 73.7 fL — ABNORMAL LOW (ref 80.0–100.0)
Platelets: 504 10*3/uL — ABNORMAL HIGH (ref 150–400)
RBC: 3.35 MIL/uL — ABNORMAL LOW (ref 3.87–5.11)
RDW: 20.2 % — ABNORMAL HIGH (ref 11.5–15.5)
WBC: 11.9 10*3/uL — ABNORMAL HIGH (ref 4.0–10.5)
nRBC: 0 % (ref 0.0–0.2)

## 2021-05-15 LAB — BASIC METABOLIC PANEL
Anion gap: 10 (ref 5–15)
BUN: 11 mg/dL (ref 8–23)
CO2: 28 mmol/L (ref 22–32)
Calcium: 8.5 mg/dL — ABNORMAL LOW (ref 8.9–10.3)
Chloride: 95 mmol/L — ABNORMAL LOW (ref 98–111)
Creatinine, Ser: 0.79 mg/dL (ref 0.44–1.00)
GFR, Estimated: >60 mL/min (ref >60)
Glucose, Bld: 136 mg/dL — ABNORMAL HIGH (ref 70–99)
Potassium: 3.2 mmol/L — ABNORMAL LOW (ref 3.5–5.1)
Sodium: 133 mmol/L — ABNORMAL LOW (ref 135–145)

## 2021-05-15 MED ORDER — GADOBUTROL 1 MMOL/ML IV SOLN
6.5000 mL | Freq: Once | INTRAVENOUS | Status: AC | PRN
  Administered 2021-05-15: 6.5 mL via INTRAVENOUS

## 2021-05-15 MED ORDER — POTASSIUM CHLORIDE CRYS ER 20 MEQ PO TBCR
40.0000 meq | EXTENDED_RELEASE_TABLET | Freq: Once | ORAL | Status: AC
  Administered 2021-05-15: 40 meq via ORAL
  Filled 2021-05-15: qty 2

## 2021-05-15 NOTE — Progress Notes (Signed)
°   05/14/21 2015  What Happened  Was fall witnessed? No  Was patient injured? No  Patient found on floor (bathroom floor)  Found by Staff-comment (Pt's RN)  Stated prior activity bathroom-unassisted  Follow Up  MD notified Clarene Essex  Family notified No - patient refusal  Time family notified 2200  Additional tests No  Progress note created (see row info) Yes  Pain Assessment  Pain Scale 0-10  Pain Score 0

## 2021-05-15 NOTE — Progress Notes (Addendum)
PROGRESS NOTE    Robin Estrada  GSU:[id number] DOB: 03-Jun-1945 DOA: 05/02/2021 PCP: [name], MD   Brief Narrative:  76 year old woman, Russian-speaking, PMH breast cancer, admitted 12/22 with history of anorexia and weight loss, found to have anemia, cystic lesion of the pancreas, infiltrative disease in the liver and right upper lobe mass.  Seen by GI, underwent EGD showing gastritis and ulcer, seen by palliative medicine with goals to remain full code, no chemotherapy, but would consider radiation.  Underwent bronchoscopy 12/27, multiple areas biopsied but pathology unrevealing.  Underwent colonoscopy 12/29.  Several polyps removed.  Status post CT-guided biopsy by interventional radiology 1/3.  Plan for MRI of the back to further assess T11 pathologic fracture to rule out epidural extension.  Repeat lab work in the morning, likely home in the next 48 hours, doing well with PT.  Assessment & Plan:   Principal Problem:   Lung mass Active Problems:   Essential hypertension   History of breast cancer   GI bleed   Chronic blood loss anemia   Leukocytosis   Hypokalemia   Thrombocytosis   Hypoalbuminemia   Pathologic compression fracture of thoracic vertebra (HCC)   Cystic mass of pancreas   Acute on chronic diastolic CHF (congestive heart failure) (HCC)   Iron deficiency anemia   Acute gastric ulcer without hemorrhage or perforation   Wheezing   Benign neoplasm of cecum   Benign neoplasm of sigmoid colon   Therapeutic opioid induced constipation   Osseous metastasis (HCC)   Other cirrhosis of liver (HCC)   Aortic atherosclerosis (HCC)   Emphysema of lung (HCC)  Lung mass/osseous metastasis/pathologic T11 compression fracture- (present on admission): per CT: Multiple lytic osseous bone metastases. Most notably a dorsally expansile lesion in the T11 vertebral body which causes moderate to severe spinal canal stenosis.  Pain fairly well controlled on Duragesic and  Lortab.  No symptoms of myelopathy or cord compression.  My colleague hospitalist discussed with Dr. Kathyrn Sheriff who recommended MRI thoracic spine which is ordered and pending. -- Concerning for primary lung cancer, also widely metastatic osseous disease noted.  Bronchoscopy specimens were unrevealing.  Status post CT-guided biopsy 1/3.  Pathology pending.  Hypertension: Elevated this morning 170/56.  She is on atenolol which she received already.  She also appears to be on clonidine patch which is not ordered and her blood pressure before it today's reading was fairly controlled.  I will watch for now.  Acute on chronic diastolic CHF: Does not appear volume overloaded.  Likely euvolemic.  Lasix stopped.  Resume outpatient diuretic on discharge.  Hypokalemia: We will replace.   Emphysema of lung ([place]) --asymptomatic   Other cirrhosis of liver (HCC) --seen on CT, appears stable, consider outpatient follow-up   Acute gastric ulcer without hemorrhage or perforation/chronic blood loss anemia/iron deficiency anemia- (present on admission) --EGD 12/24: 4 cm HH.  Clean-based, non-bleeding GU, erosive gastritis. Biopsy showed no significant abnormality of small bowel, negative H pylori, no inflammation/dysplasia or malignancy.  Continue PPI.  GI on board. No aspirin, ibuprofen, naproxen, or other non-steroidal anti-inflammatory drugs for 2 weeks after polyp removal.  She received IV iron here.  Will start on oral iron at discharge.  Hemoglobin has remained stable at 7.1 since yesterday.  Will transfuse if drops less than 7.   Cystic mass of pancreas- (present on admission) -- CA 19-9 level normal.  On board and they plan to perform MRI/MRCP to evaluate pancreas at some point in time but not urgent.  DVT prophylaxis: SCDs Start: 05/03/21 1610   Code Status: Full Code  Family Communication:  None present at bedside.  Plan of care discussed with patient in length and he verbalized understanding and  agreed with it.  Status is: Inpatient  Remains inpatient appropriate because: Awaiting pathology results  Estimated body mass index is 24.62 kg/m as calculated from the following:   Height as of this encounter: 5\' 5"  (1.651 m).   Weight as of this encounter: 67.1 kg.  Nutritional Assessment: Body mass index is 24.62 kg/m.Marland Kitchen Seen by dietician.  I agree with the assessment and plan as outlined below: Nutrition Status: Nutrition Problem: Inadequate oral intake Etiology: poor appetite Signs/Symptoms: per patient/family report Interventions: Ensure Enlive (each supplement provides 350kcal and 20 grams of protein), MVI  Skin Assessment: I have examined the patient's skin and I agree with the wound assessment as performed by the wound care RN as outlined below:    Consultants:  GI  Procedures:  None  Antimicrobials:  Anti-infectives (From admission, onward)    None          Subjective: [place] video remote interpreter used for communication.  Patient seen and examined.  She complains of some headache and back pain which is not unusual but she is extremely focused on her elevated blood pressure.  Although she had only received her antihypertensives.  She appears to be frustrated.  I offered her something for pain and something for blood pressure but she refused.  Objective: Vitals:   05/14/21 1435 05/14/21 2013 05/15/21 0526 05/15/21 0733  BP: (!) 154/45 (!) 136/49 (!) 152/63 (!) 170/56  Pulse: 72 82 65 73  Resp: 18 17 18 16   Temp: 97.8 F (36.6 C) 98.8 F (37.1 C) 98 F (36.7 C) 97.7 F (36.5 C)  TempSrc: Oral Oral Oral Oral  SpO2: 97% 94% 97% 97%  Weight:      Height:        Intake/Output Summary (Last 24 hours) at 05/15/2021 1410 Last data filed at 05/14/2021 2015 Gross per 24 hour  Intake 120 ml  Output 1 ml  Net 119 ml   Filed Weights   05/04/21 0945 05/07/21 0815 05/09/21 1117  Weight: 67.1 kg 67.1 kg 67.1 kg    Examination:  General exam: Appears  calm and comfortable  Respiratory system: Clear to auscultation. Respiratory effort normal. Cardiovascular system: S1 & S2 heard, RRR. No JVD, murmurs, rubs, gallops or clicks. No pedal edema. Gastrointestinal system: Abdomen is nondistended, soft and nontender. No organomegaly or masses felt. Normal bowel sounds heard. Central nervous system: Alert and oriented. No focal neurological deficits. Extremities: Symmetric 5 x 5 power. Skin: No rashes, lesions or ulcers Psychiatry: Judgement and insight appear normal. Mood & affect irritable.   Data Reviewed: I have personally reviewed following labs and imaging studies  CBC: Recent Labs  Lab 05/09/21 0604 05/10/21 0724 05/12/21 0643 05/15/21 0605  WBC 11.7* 8.9 12.8* 11.9*  HGB 7.7* 7.4* 7.1* 7.1*  HCT 26.1* 25.2* 23.5* 24.7*  MCV 74.1* 74.1* 74.6* 73.7*  PLT 444* PLATELET CLUMPS NOTED ON SMEAR, UNABLE TO ESTIMATE 459* 960*   Basic Metabolic Panel: Recent Labs  Lab 05/10/21 0724 05/12/21 0643 05/14/21 0655 05/15/21 0605  NA 135 133* 131* 133*  K 3.4* 3.8 3.5 3.2*  CL 98 99 93* 95*  CO2 27 26 28 28   GLUCOSE 121* 144* 124* 136*  BUN 7* 9 10 11   CREATININE 0.67 0.71 0.80 0.79  CALCIUM 8.5* 8.2* 8.2* 8.5*  GFR: Estimated Creatinine Clearance: 53.8 mL/min (by C-G formula based on SCr of 0.79 mg/dL). Liver Function Tests: Recent Labs  Lab 05/10/21 0724 05/12/21 0643  AST 13* 13*  ALT 11 12  ALKPHOS 84 85  BILITOT 0.6 0.6  PROT 5.7* 5.8*  ALBUMIN 2.3* 2.4*   No results for input(s): LIPASE, AMYLASE in the last 168 hours. No results for input(s): AMMONIA in the last 168 hours. Coagulation Profile: No results for input(s): INR, PROTIME in the last 168 hours. Cardiac Enzymes: No results for input(s): CKTOTAL, CKMB, CKMBINDEX, TROPONINI in the last 168 hours. BNP (last 3 results) No results for input(s): PROBNP in the last 8760 hours. HbA1C: No results for input(s): HGBA1C in the last 72 hours. CBG: No results for  input(s): GLUCAP in the last 168 hours. Lipid Profile: No results for input(s): CHOL, HDL, LDLCALC, TRIG, CHOLHDL, LDLDIRECT in the last 72 hours. Thyroid Function Tests: No results for input(s): TSH, T4TOTAL, FREET4, T3FREE, THYROIDAB in the last 72 hours. Anemia Panel: No results for input(s): VITAMINB12, FOLATE, FERRITIN, TIBC, IRON, RETICCTPCT in the last 72 hours. Sepsis Labs: No results for input(s): PROCALCITON, LATICACIDVEN in the last 168 hours.  No results found for this or any previous visit (from the past 240 hour(s)).    Radiology Studies: DG Chest Port 1 View  Result Date: 05/14/2021 CLINICAL DATA:  Pneumothorax after right lung biopsy EXAM: PORTABLE CHEST 1 VIEW COMPARISON:  04/18/2021, 05/14/2021 FINDINGS: Single frontal view of the chest demonstrates an unremarkable cardiac silhouette. Previously identified right upper lobe mass again seen. No new airspace disease. No evidence of pleural effusion. I do not see any pneumothorax after recent right lung biopsy. No acute bony abnormalities. IMPRESSION: 1. No complication after right lung biopsy. 2. Stable right lung mass. Electronically Signed   By: Randa Ngo M.D.   On: 05/14/2021 15:21   CT LUNG MASS BIOPSY  Result Date: 05/14/2021 CLINICAL DATA:  Posterior right upper lobe lung mass measuring up to approximately 3.8 cm by CT. Bronchoscopy on 05/07/2021 with sampling was not conclusive for malignancy. EXAM: CT GUIDED CORE BIOPSY OF RIGHT UPPER LOBE LUNG MASS ANESTHESIA/SEDATION: Moderate (conscious) sedation was employed during this procedure. A total of Versed 2.0 mg and Fentanyl 50 mcg was administered intravenously by radiology nursing. Moderate Sedation Time: 39 minutes. The patient's level of consciousness and vital signs were monitored continuously by radiology nursing throughout the procedure under my direct supervision. PROCEDURE: The procedure risks, benefits, and alternatives were explained to the patient. Questions  regarding the procedure were encouraged and answered. The patient understands and consents to the procedure. A time-out was performed prior to initiating the procedure. Initial CT was performed through the chest in a prone position. The right posterior and posterolateral chest wall was prepped with chlorhexidine in a sterile fashion, and a sterile drape was applied covering the operative field. A sterile gown and sterile gloves were used for the procedure. Local anesthesia was provided with 1% Lidocaine. Under CT guidance, past a 17 gauge trocar needle was advanced to the level of a posterior right upper lobe lung mass. After confirming needle tip position, an 18 gauge core biopsy sample was obtained. Additional CT was performed. Aspiration of air was then performed in the posterior right pleural space through the outer needle utilizing a 3 way stopcock and syringe. Additional CT was performed. COMPLICATIONS: Small right posterior pneumothorax treated with needle aspiration. FINDINGS: Initial imaging again demonstrates a solid and lobulated oval-shaped mass in the posterior right  upper lobe that abuts the major fissure and posterolateral pleural surface. There was significant difficulty in approaching the mass percutaneously due to overlying ribs and the scapula. Eventually a window was available along the lateral aspect of the mass for biopsy which successfully yielded a single intact core biopsy sample. After the first biopsy sample was obtained, there was development of a small posterior pneumothorax. This was treated with needle aspiration resulting in diminished size of the pneumothorax. Due to pneumothorax development, further core biopsy samples were not attempted to be attained. IMPRESSION: CT-guided core biopsy of right upper lobe lung mass. Approach to the mass was difficult due to intervening scapula and ribs. After a single core biopsy was obtained, there was development of a small pneumothorax which was  treated with needle aspiration. Electronically Signed   By: Aletta Edouard M.D.   On: 05/14/2021 16:36    Scheduled Meds:  atenolol  100 mg Oral BID   feeding supplement  237 mL Oral TID BM   fentaNYL  1 patch Transdermal Q72H   lidocaine  1 patch Transdermal Q24H   multivitamin with minerals  1 tablet Oral Daily   pantoprazole  40 mg Oral BID AC   polyethylene glycol  17 g Oral Daily   sodium chloride flush  3 mL Intravenous Q12H   Continuous Infusions:   LOS: 12 days   Time spent: 37 minutes   Darliss Cheney, MD [facility]  05/15/2021, 2:10 PM  Please page via [place] and do not message via secure chat for anything urgent. Secure chat can be used for anything non urgent.  How to contact the [facility] Attending or Consulting provider [place] or covering provider during after hours [place], for this patient?  Check the care team in [facility] and look for a) attending/consulting TRH provider listed and b) the [facility] team listed. Page or secure chat [phone]. Log into [email or web address] and use [place]'s universal password to access. If you do not have the password, please contact the hospital operator. Locate the [facility] provider you are looking for under [facility] and page to a number that you can be directly reached. If you still have difficulty reaching the provider, please page the [facility] (Director on Call) for the Hospitalists listed on amion for assistance.

## 2021-05-15 NOTE — Progress Notes (Signed)
Physical Therapy Treatment Patient Details Name: Robin Estrada MRN: [id number] DOB: 09-29-1945 Today's Date: 05/15/2021   History of Present Illness 76 y.o. female presents to [facility] hospital on 05/02/2021 with complaints of weakness, DOE. 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 s/p bronchoscopy 12/27 with multiple areas biopsied however pathology came back unremarkable. Current plan is for ultrasound-guided biopsy by interventional radiology on Tuesday, 1/3. 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    PT Comments    Pt admitted with above diagnosis. Pt was able to ambulate in room with fair balance overall. Pt does not lose balance but ambulates slowly and cautiously. Pt refused to walk outside of room and is very particular with what she wants to participate with . Will continue to progress pt as she allows.  Pt currently with functional limitations due to balance and endurance deficits. Pt will benefit from skilled PT to increase their independence and safety with mobility to allow discharge to the venue listed below.      Recommendations for follow up therapy are one component of a multi-disciplinary discharge planning process, led by the attending physician.  Recommendations may be updated based on patient status, additional functional criteria and insurance authorization.  Follow Up Recommendations  Outpatient PT     Assistance Recommended at Discharge PRN  Patient can return home with the following     Equipment Recommendations  None recommended by PT    Recommendations for Other Services       Precautions / Restrictions Precautions Precautions: Fall Precaution Comments: anemic Restrictions Weight Bearing Restrictions: No     Mobility  Bed Mobility Overal bed mobility: Modified  Independent                  Transfers Overall transfer level: Modified independent Equipment used: None Transfers: Sit to/from Stand Sit to Stand: Supervision           General transfer comment: Pt mod I in room, up in room ad lib    Ambulation/Gait Ambulation/Gait assistance: Min guard Gait Distance (Feet): 150 Feet Assistive device: None Gait Pattern/deviations: Step-through pattern;Decreased stride length;Narrow base of support;Scissoring Gait velocity: decreased Gait velocity interpretation: <1.8 ft/sec, indicate of risk for recurrent falls   General Gait Details: Pt would only ambulate in room but did do several laps.  Slow, mostly steady gait with pt seeming to take great caution but no LOB.  A few standing rest breaks.   Stairs             Wheelchair Mobility    Modified Rankin (Stroke Patients Only)       Balance Overall balance assessment: Mild deficits observed, not formally tested                                          Cognition Arousal/Alertness: Awake/alert Behavior During Therapy: WFL for tasks assessed/performed Overall Cognitive Status: Within Functional Limits for tasks assessed                                 General Comments: Pt not agreeable to therapy when PT first went in and asked PT to come back.  Did work with PT  on return but would only walk in the room.        Exercises      General Comments        Pertinent Vitals/Pain Pain Assessment: No/denies pain    Home Living                          Prior Function            PT Goals (current goals can now be found in the care plan section) Progress towards PT goals: Progressing toward goals    Frequency    Min 3X/week      PT Plan Current plan remains appropriate    Co-evaluation              AM-PAC PT "6 Clicks" Mobility   Outcome Measure  Help needed turning from your back to your side while in a  flat bed without using bedrails?: None Help needed moving from lying on your back to sitting on the side of a flat bed without using bedrails?: None Help needed moving to and from a bed to a chair (including a wheelchair)?: None Help needed standing up from a chair using your arms (e.g., wheelchair or bedside chair)?: None Help needed to walk in hospital room?: A Little Help needed climbing 3-5 steps with a railing? : A Little 6 Click Score: 22    End of Session Equipment Utilized During Treatment: Gait belt Activity Tolerance: Patient limited by fatigue Patient left: with call bell/phone within reach;in chair;with chair alarm set Nurse Communication: Mobility status PT Visit Diagnosis: Other abnormalities of gait and mobility (R26.89);Muscle weakness (generalized) (M62.81)     Time: 0932-3557 PT Time Calculation (min) (ACUTE ONLY): 18 min  Charges:  $Gait Training: 8-22 mins                     Chauncey Bruno M,PT Acute Rehab Services [phone] (pager)    [name] 05/15/2021, 2:27 PM

## 2021-05-16 ENCOUNTER — Ambulatory Visit
Admit: 2021-05-16 | Discharge: 2021-05-16 | Disposition: A | Discharge status: Home / Self Care | Payer: Medicare HMO | Attending: Radiation Oncology | Admitting: Radiation Oncology

## 2021-05-16 ENCOUNTER — Other Ambulatory Visit: Payer: Self-pay | Admitting: Radiation Therapy

## 2021-05-16 ENCOUNTER — Encounter (HOSPITAL_COMMUNITY): Payer: Self-pay | Admitting: Internal Medicine

## 2021-05-16 DIAGNOSIS — G9520 Unspecified cord compression: Secondary | ICD-10-CM

## 2021-05-16 DIAGNOSIS — C7951 Secondary malignant neoplasm of bone: Secondary | ICD-10-CM

## 2021-05-16 DIAGNOSIS — R918 Other nonspecific abnormal finding of lung field: Secondary | ICD-10-CM | POA: Diagnosis not present

## 2021-05-16 DIAGNOSIS — C50812 Malignant neoplasm of overlapping sites of left female breast: Secondary | ICD-10-CM

## 2021-05-16 LAB — BASIC METABOLIC PANEL
Anion gap: 10 (ref 5–15)
BUN: 9 mg/dL (ref 8–23)
CO2: 26 mmol/L (ref 22–32)
Calcium: 8.2 mg/dL — ABNORMAL LOW (ref 8.9–10.3)
Chloride: 96 mmol/L — ABNORMAL LOW (ref 98–111)
Creatinine, Ser: 0.75 mg/dL (ref 0.44–1.00)
GFR, Estimated: >60 mL/min (ref >60)
Glucose, Bld: 120 mg/dL — ABNORMAL HIGH (ref 70–99)
Potassium: 3.6 mmol/L (ref 3.5–5.1)
Sodium: 132 mmol/L — ABNORMAL LOW (ref 135–145)

## 2021-05-16 LAB — CBC
HCT: 23.4 % — ABNORMAL LOW (ref 36.0–46.0)
Hemoglobin: 7 g/dL — ABNORMAL LOW (ref 12.0–15.0)
MCH: 21.9 pg — ABNORMAL LOW (ref 26.0–34.0)
MCHC: 29.9 g/dL — ABNORMAL LOW (ref 30.0–36.0)
MCV: 73.1 fL — ABNORMAL LOW (ref 80.0–100.0)
Platelets: 510 10*3/uL — ABNORMAL HIGH (ref 150–400)
RBC: 3.2 MIL/uL — ABNORMAL LOW (ref 3.87–5.11)
RDW: 20 % — ABNORMAL HIGH (ref 11.5–15.5)
WBC: 11.2 10*3/uL — ABNORMAL HIGH (ref 4.0–10.5)
nRBC: 0 % (ref 0.0–0.2)

## 2021-05-16 LAB — HEMOGLOBIN AND HEMATOCRIT, BLOOD
HCT: 29.2 % — ABNORMAL LOW (ref 36.0–46.0)
Hemoglobin: 9.2 g/dL — ABNORMAL LOW (ref 12.0–15.0)

## 2021-05-16 LAB — PREPARE RBC (CROSSMATCH)

## 2021-05-16 MED ORDER — SODIUM CHLORIDE 0.9% IV SOLUTION: Freq: Once | INTRAVENOUS | Status: AC

## 2021-05-16 NOTE — Progress Notes (Addendum)
OT Cancellation Note  Patient Details Name: Robin Estrada MRN: [id number] DOB: November 02, 1945   Cancelled Treatment:    Reason Eval/Treat Not Completed: Other (comment) (RN reports pt about to receive blood. Will follow up as schedule allows.)  Lynnda Child, OTD, OTR/L Acute Rehab [phone] - [place] 05/16/2021, 1:59 PM

## 2021-05-16 NOTE — Consult Note (Signed)
Reason for Consult: T11 metastatic lesion Referring Physician: Dr. [name] is an 76 y.o. female.  HPI: Patient is a 76 year old individual whose had a history of breast cancer back in 2019.  She had initial treatment with radiation and subsequently was lost to follow-up but recently has had about a 30 pound weight loss over the last number of months along with significant anemia.  A recent work-up discloses that the patient has a right chest mass and because of some back pain she has been experiencing an MRI of the thoracic spine demonstrates that there is a T11 lesion with involvement of the posterior wall creating a mild degree of narrowing of the central canal at that level.  The patient denies any weakness in her legs and she notes that the back pain itself is tolerable.  Past Medical History:  Diagnosis Date   Breast mass, left    Large   Cancer ([place])    FIBROMYOMA  AGE 77-   BENIGN  RESOLVED   Headache    Heart murmur    Hypertension    Mitral valve prolapse    Tuberculosis    AS CHILD  W/ TX    Past Surgical History:  Procedure Laterality Date   BIOPSY  05/04/2021   Procedure: BIOPSY;  Surgeon: Irving Copas., MD;  Location: [facility] ENDOSCOPY;  Service: Gastroenterology;;   BRONCHIAL BIOPSY  05/07/2021   Procedure: BRONCHIAL BIOPSIES;  Surgeon: Margaretha Seeds, MD;  Location: [facility] ENDOSCOPY;  Service: Pulmonary;;   BRONCHIAL BRUSHINGS  05/07/2021   Procedure: BRONCHIAL BRUSHINGS;  Surgeon: Margaretha Seeds, MD;  Location: [facility] ENDOSCOPY;  Service: Pulmonary;;   BRONCHIAL NEEDLE ASPIRATION BIOPSY  05/07/2021   Procedure: BRONCHIAL NEEDLE ASPIRATION BIOPSIES;  Surgeon: Margaretha Seeds, MD;  Location: [facility] ENDOSCOPY;  Service: Pulmonary;;   BRONCHIAL WASHINGS  05/07/2021   Procedure: BRONCHIAL WASHINGS;  Surgeon: Margaretha Seeds, MD;  Location: [place];  Service: Pulmonary;;   COLONOSCOPY WITH PROPOFOL N/A 05/09/2021   Procedure: COLONOSCOPY WITH  PROPOFOL;  Surgeon: Ladene Artist, MD;  Location: [facility] ENDOSCOPY;  Service: Endoscopy;  Laterality: N/A;   ESOPHAGOGASTRODUODENOSCOPY N/A 05/04/2021   Procedure: ESOPHAGOGASTRODUODENOSCOPY (EGD);  Surgeon: Irving Copas., MD;  Location: [place];  Service: Gastroenterology;  Laterality: N/A;   HEMOSTASIS CONTROL  05/07/2021   Procedure: HEMOSTASIS CONTROL;  Surgeon: Margaretha Seeds, MD;  Location[facility] ENDOSCOPY;  Service: Pulmonary;;   MASS EXCISION Left 08/14/2017   Procedure: PARTIAL EXCISION  LEFT BREAST  MASS ERAS PATHWAY;  Surgeon: Fanny Skates, MD;  Location: [place];  Service: General;  Laterality: Left;   MASTECTOMY W/ SENTINEL NODE BIOPSY Left 09/14/2017   Procedure: LEFT TOTAL MASTECTOMY WITH SENTINEL LYMPH NODE BIOPSY;  Surgeon: Fanny Skates, MD;  Location: [place];  Service: General;  Laterality: Left;   NO PAST SURGERIES     POLYPECTOMY  05/09/2021   Procedure: POLYPECTOMY;  Surgeon: Ladene Artist, MD;  Location: [facility] ENDOSCOPY;  Service: Endoscopy;;   SUBMUCOSAL TATTOO INJECTION  05/09/2021   Procedure: SUBMUCOSAL TATTOO INJECTION;  Surgeon: Ladene Artist, MD;  Location: [place];  Service: Endoscopy;;   VIDEO BRONCHOSCOPY WITH ENDOBRONCHIAL ULTRASOUND N/A 05/07/2021   Procedure: VIDEO BRONCHOSCOPY WITH ENDOBRONCHIAL ULTRASOUND;  Surgeon: Margaretha Seeds, MD;  Location: [place];  Service: Pulmonary;  Laterality: N/A;    Family History  Problem Relation Age of Onset   Stroke Mother    Stroke Maternal Grandmother    Breast cancer Sister  Social History:  reports that she has never smoked. She has never used smokeless tobacco. She reports current alcohol use. She reports that she does not use drugs.  Allergies:  Allergies  Allergen Reactions   Statins Nausea And Vomiting and Other (See Comments)    MUSCLE PAIN   Amlodipine     fatigue   Clonidine Derivatives     headache   Losartan     abd pain   Sulfa Antibiotics Nausea Only    Sulfamethoxazole-Trimethoprim Nausea Only    " Severe Nausea "    Medications: I have reviewed the patient's current medications.  Results for orders placed or performed during the hospital encounter of 05/02/21 (from the past 48 hour(s))  Basic metabolic panel     Status: Abnormal   Collection Time: 05/15/21  6:05 AM  Result Value Ref Range   Sodium 133 (L) 135 - 145 mmol/L   Potassium 3.2 (L) 3.5 - 5.1 mmol/L   Chloride 95 (L) 98 - 111 mmol/L   CO2 28 22 - 32 mmol/L   Glucose, Bld 136 (H) 70 - 99 mg/dL    Comment: Glucose reference range applies only to samples taken after fasting for at least 8 hours.   BUN 11 8 - 23 mg/dL   Creatinine, Ser 0.79 0.44 - 1.00 mg/dL   Calcium 8.5 (L) 8.9 - 10.3 mg/dL   GFR, Estimated >60 >60 mL/min    Comment: (NOTE) Calculated using the CKD-EPI Creatinine Equation (2021)    Anion gap 10 5 - 15    Comment: Performed at [facility] [address], [place], [place] [address]  CBC     Status: Abnormal   Collection Time: 05/15/21  6:05 AM  Result Value Ref Range   WBC 11.9 (H) 4.0 - 10.5 K/uL   RBC 3.35 (L) 3.87 - 5.11 MIL/uL   Hemoglobin 7.1 (L) 12.0 - 15.0 g/dL    Comment: Reticulocyte Hemoglobin testing may be clinically indicated, consider ordering this additional test [id number]    HCT 24.7 (L) 36.0 - 46.0 %   MCV 73.7 (L) 80.0 - 100.0 fL   MCH 21.2 (L) 26.0 - 34.0 pg   MCHC 28.7 (L) 30.0 - 36.0 g/dL   RDW 20.2 (H) 11.5 - 15.5 %   Platelets 504 (H) 150 - 400 K/uL   nRBC 0.0 0.0 - 0.2 %    Comment: Performed at [facility], [place] [address] [place], [place] [address]  CBC     Status: Abnormal   Collection Time: 05/16/21  6:40 AM  Result Value Ref Range   WBC 11.2 (H) 4.0 - 10.5 K/uL   RBC 3.20 (L) 3.87 - 5.11 MIL/uL   Hemoglobin 7.0 (L) 12.0 - 15.0 g/dL    Comment: Reticulocyte Hemoglobin testing may be clinically indicated, consider ordering this additional test [id number]    HCT 23.4 (L) 36.0 - 46.0 %   MCV 73.1 (L)  80.0 - 100.0 fL   MCH 21.9 (L) 26.0 - 34.0 pg   MCHC 29.9 (L) 30.0 - 36.0 g/dL   RDW 20.0 (H) 11.5 - 15.5 %   Platelets 510 (H) 150 - 400 K/uL   nRBC 0.0 0.0 - 0.2 %    Comment: Performed at [facility], [place] [address] [place], [place] [address]  Basic metabolic panel     Status: Abnormal   Collection Time: 05/16/21  6:40 AM  Result Value Ref Range   Sodium 132 (  L) 135 - 145 mmol/L   Potassium 3.6 3.5 - 5.1 mmol/L   Chloride 96 (L) 98 - 111 mmol/L   CO2 26 22 - 32 mmol/L   Glucose, Bld 120 (H) 70 - 99 mg/dL    Comment: Glucose reference range applies only to samples taken after fasting for at least 8 hours.   BUN 9 8 - 23 mg/dL   Creatinine, Ser 0.75 0.44 - 1.00 mg/dL   Calcium 8.2 (L) 8.9 - 10.3 mg/dL   GFR, Estimated >60 >60 mL/min    Comment: (NOTE) Calculated using the CKD-EPI Creatinine Equation (2021)    Anion gap 10 5 - 15    Comment: Performed at [facility] [address], [place], [place] [address]  Type and screen Ages     Status: None (Preliminary result)   Collection Time: 05/16/21 12:13 PM  Result Value Ref Range   ABO/RH(D) B POS    Antibody Screen NEG    Sample Expiration 05/19/2021,2359    Unit Number [id number]    Blood Component Type RED CELLS,LR    Unit division 00    Status of Unit ISSUED    Transfusion Status OK TO TRANSFUSE    Crossmatch Result      Compatible Performed at [facility], [place] [address], [place], [place] [address]   Prepare RBC (crossmatch)     Status: None   Collection Time: 05/16/21 12:13 PM  Result Value Ref Range   Order Confirmation      ORDER PROCESSED BY BLOOD BANK Performed at [facility], [place] [address], [place], [place] [address]     MR THORACIC SPINE W WO CONTRAST  Result Date: 05/15/2021 CLINICAL DATA:  T11 metastatic disease.  Right upper lobe lung mass. EXAM: MRI THORACIC WITHOUT AND WITH CONTRAST TECHNIQUE: Multiplanar and multiecho pulse sequences of the  thoracic spine were obtained without and with intravenous contrast. CONTRAST:  6.7mL GADAVIST GADOBUTROL 1 MMOL/ML IV SOLN COMPARISON:  CT chest dated May 03, 2021. FINDINGS: Alignment:  Physiologic. Vertebrae: Multiple thoracic vertebral body metastatic lesions. Pathologic fracture of the T11 vertebral body with up to 35% height loss. Thin anterior epidural tumor extension with associated enhancement (series 10, image 12). No other sites of epidural extension. Cord:  Normal signal and morphology.  No intrathecal enhancement. Paraspinal and other soft tissues: Large right upper lobe mass. 2.1 cm cystic lesion in the pancreatic tail. Disc levels: No significant disc bulge or herniation. Bulging of the T11 posterior vertebral body and overlying anterior epidural tumor extension results in moderate spinal canal stenosis with slight cord flattening. No neuroforaminal stenosis at any level. IMPRESSION: 1. Multiple thoracic vertebral body metastases again noted. Pathologic fracture of the T11 vertebral body with up to 35% height loss. Bulging of the posterior vertebral body margin with overlying thin anterior epidural tumor extension results in moderate spinal canal stenosis at this level. No cord signal abnormality. 2. Right upper lobe lung cancer as seen on prior studies. 3. 2.1 cm cystic lesion in the pancreatic tail. Please see prior CT abdomen and pelvis report from 05/03/2021 for follow-up recommendations. Electronically Signed   By: Titus Dubin M.D.   On: 05/15/2021 17:29    Review of Systems  HENT: Negative.    Gastrointestinal: Negative.   Musculoskeletal: Negative.   Hematological: Negative.        History of anemia  Psychiatric/Behavioral: Negative.    All other systems reviewed and are negative.  Blood pressure (!) 165/62, pulse 80, temperature 98.1 F (36.7 C), temperature source Oral, resp. rate 16, height 5\' 5"  (1.651 m), weight 67.1 kg, SpO2 98 %. Physical Exam Constitutional:       Appearance: Normal appearance. She is normal weight.  HENT:     Head: Normocephalic and atraumatic.     Right Ear: Tympanic membrane normal.     Left Ear: Tympanic membrane normal.     Nose: Nose normal.     Mouth/Throat:     Mouth: Mucous membranes are moist.  Eyes:     Extraocular Movements: Extraocular movements intact.     Pupils: Pupils are equal, round, and reactive to light.  Cardiovascular:     Rate and Rhythm: Normal rate and regular rhythm.     Pulses: Normal pulses.     Heart sounds: Normal heart sounds.  Pulmonary:     Effort: Pulmonary effort is normal.     Breath sounds: Normal breath sounds.  Abdominal:     General: Abdomen is flat. Bowel sounds are normal.     Palpations: Abdomen is soft.  Musculoskeletal:        General: Normal range of motion.     Cervical back: Normal range of motion and neck supple.  Skin:    General: Skin is warm and dry.     Capillary Refill: Capillary refill takes less than 2 seconds.  Neurological:     Mental Status: She is alert.     Comments: Motor function is normal in the upper and lower extremities.  Deep tendon reflexes are 2+ in the patellae 1+ Achilles 2+ in the biceps 1+ and triceps.  Cranial nerve examination is normal  Psychiatric:        Mood and Affect: Mood normal.        Behavior: Behavior normal.        Thought Content: Thought content normal.        Judgment: Judgment normal.    Assessment/Plan: T11 metastatic cancer with mild canal compromise.  I have recommended palliative radiation therapy without surgical intervention for this process.  I have discussed the situation with the patient's son Mr. Robin Estrada via telephone [phone].  He notes that radiation has already seen his mother and they are planning on proceeding with radiation including a SIM tomorrow.  Robin Estrada 05/16/2021, 7:06 PM

## 2021-05-16 NOTE — Progress Notes (Signed)
RN called [facility] and setup transportation for 930am, for radiation treatment at 10am..

## 2021-05-16 NOTE — Progress Notes (Signed)
PROGRESS NOTE    Robin Estrada  YCX:[id number] DOB: February 21, 1946 DOA: 05/02/2021 PCP: [name], MD   Brief Narrative:  76 year old woman, Russian-speaking, PMH breast cancer, admitted 12/22 with history of anorexia and weight loss, found to have anemia, cystic lesion of the pancreas, infiltrative disease in the liver and right upper lobe mass.  Seen by GI, underwent EGD showing gastritis and ulcer, seen by palliative medicine with goals to remain full code, no chemotherapy, but would consider radiation.  Underwent bronchoscopy 12/27, multiple areas biopsied but pathology unrevealing.  Underwent colonoscopy 12/29.  Several polyps removed.  Status post CT-guided biopsy by interventional radiology 1/3.  Plan for MRI of the back to further assess T11 pathologic fracture to rule out epidural extension.  Repeat lab work in the morning, likely home in the next 48 hours, doing well with PT.  Assessment & Plan:   Principal Problem:   Lung mass Active Problems:   Essential hypertension   History of breast cancer   GI bleed   Chronic blood loss anemia   Leukocytosis   Hypokalemia   Thrombocytosis   Hypoalbuminemia   Pathologic compression fracture of thoracic vertebra (HCC)   Cystic mass of pancreas   Acute on chronic diastolic CHF (congestive heart failure) (HCC)   Iron deficiency anemia   Acute gastric ulcer without hemorrhage or perforation   Wheezing   Benign neoplasm of cecum   Benign neoplasm of sigmoid colon   Therapeutic opioid induced constipation   Osseous metastasis (HCC)   Other cirrhosis of liver (HCC)   Aortic atherosclerosis (HCC)   Emphysema of lung (HCC)  Lung mass/osseous metastasis/pathologic T11 compression fracture- (present on admission): per CT: Multiple lytic osseous bone metastases. Most notably a dorsally expansile lesion in the T11 vertebral body which causes moderate to severe spinal canal stenosis.  Pain fairly well controlled on Duragesic and  Lortab.  No symptoms of myelopathy or cord compression.  My colleague hospitalist discussed with Dr. Kathyrn Sheriff who recommended MRI thoracic spine which completed and shows 35% height loss on T11.  I have consulted and spoken to Dr. Ellene Route today who will see this patient in consultation. -- Concerning for primary lung cancer, also widely metastatic osseous disease noted.  Bronchoscopy specimens were unrevealing.  Status post CT-guided biopsy 1/3.  Pathology pending.  Hypertension: Fairly controlled.  Continue atenolol.  Acute on chronic diastolic CHF: Does not appear volume overloaded.  Likely euvolemic.  Lasix stopped.  Resume outpatient diuretic on discharge.  Hypokalemia: Resolved   Emphysema of lung (HCC) --asymptomatic   Other cirrhosis of liver (HCC) --seen on CT, appears stable, consider outpatient follow-up   Acute gastric ulcer without hemorrhage or perforation/chronic blood loss anemia/iron deficiency anemia- (present on admission) --EGD 12/24: 4 cm HH.  Clean-based, non-bleeding GU, erosive gastritis. Biopsy showed no significant abnormality of small bowel, negative H pylori, no inflammation/dysplasia or malignancy.  Continue PPI.  GI on board. No aspirin, ibuprofen, naproxen, or other non-steroidal anti-inflammatory drugs for 2 weeks after polyp removal.  She received IV iron here.  Will start on oral iron at discharge.  Hemoglobin dropped to 7.0.  Discussed with patient and her son per patient's request, patient consented for transfusion.  She also would like to have IV iron but not today.   Cystic mass of pancreas- (present on admission) -- CA 19-9 level normal.  On board and they plan to perform MRI/MRCP to evaluate pancreas at some point in time but not urgent.    DVT prophylaxis:  SCDs Start: 05/03/21 6256   Code Status: Full Code  Family Communication:  None present at bedside.  Plan of care discussed with patient in length and he verbalized understanding and agreed with it.   Also discussed with her son over the phone.  Status is: Inpatient  Remains inpatient appropriate because: Awaiting pathology results  Estimated body mass index is 24.62 kg/m as calculated from the following:   Height as of this encounter: 5\' 5"  (1.651 m).   Weight as of this encounter: 67.1 kg.  Nutritional Assessment: Body mass index is 24.62 kg/m.Marland Kitchen Seen by dietician.  I agree with the assessment and plan as outlined below: Nutrition Status: Nutrition Problem: Inadequate oral intake Etiology: poor appetite Signs/Symptoms: per patient/family report Interventions: Ensure Enlive (each supplement provides 350kcal and 20 grams of protein), MVI  Skin Assessment: I have examined the patient's skin and I agree with the wound assessment as performed by the wound care RN as outlined below:    Consultants:  GI Neurosurgery  Procedures:  None  Antimicrobials:  Anti-infectives (From admission, onward)    None          Subjective: Remote video [place] interpreter used.  Patient seen and examined.  She complains of weakness but also states that she is actually stronger than when she came in and her appetite is improving as well.  Some back pain.  No other complaint.  Objective: Vitals:   05/15/21 1739 05/15/21 2059 05/15/21 2059 05/16/21 0917  BP: (!) 130/42 (!) 162/60 (!) 162/60 (!) 132/55  Pulse: 74 77 77 78  Resp: 16   17  Temp: 99.1 F (37.3 C) 99.1 F (37.3 C) 99.1 F (37.3 C) 98.1 F (36.7 C)  TempSrc: Oral Oral Oral Oral  SpO2: 95% 97% 97% 98%  Weight:      Height:        Intake/Output Summary (Last 24 hours) at 05/16/2021 1208 Last data filed at 05/15/2021 2300 Gross per 24 hour  Intake 240 ml  Output --  Net 240 ml    Filed Weights   05/04/21 0945 05/07/21 0815 05/09/21 1117  Weight: 67.1 kg 67.1 kg 67.1 kg    Examination:  General exam: Appears calm and comfortable  Respiratory system: Clear to auscultation. Respiratory effort  normal. Cardiovascular system: S1 & S2 heard, RRR. No JVD, murmurs, rubs, gallops or clicks. No pedal edema. Gastrointestinal system: Abdomen is nondistended, soft and nontender. No organomegaly or masses felt. Normal bowel sounds heard. Central nervous system: Alert and oriented. No focal neurological deficits. Extremities: Symmetric 5 x 5 power. Skin: No rashes, lesions or ulcers.  Psychiatry: Judgement and insight appear normal. Mood & affect appropriate.   Data Reviewed: I have personally reviewed following labs and imaging studies  CBC: Recent Labs  Lab 05/10/21 0724 05/12/21 0643 05/15/21 0605 05/16/21 0640  WBC 8.9 12.8* 11.9* 11.2*  HGB 7.4* 7.1* 7.1* 7.0*  HCT 25.2* 23.5* 24.7* 23.4*  MCV 74.1* 74.6* 73.7* 73.1*  PLT PLATELET CLUMPS NOTED ON SMEAR, UNABLE TO ESTIMATE 459* 504* 510*    Basic Metabolic Panel: Recent Labs  Lab 05/10/21 0724 05/12/21 0643 05/14/21 0655 05/15/21 0605 05/16/21 0640  NA 135 133* 131* 133* 132*  K 3.4* 3.8 3.5 3.2* 3.6  CL 98 99 93* 95* 96*  CO2 27 26 28 28 26   GLUCOSE 121* 144* 124* 136* 120*  BUN 7* 9 10 11 9   CREATININE 0.67 0.71 0.80 0.79 0.75  CALCIUM 8.5* 8.2* 8.2* 8.5* 8.2*  GFR: Estimated Creatinine Clearance: 53.8 mL/min (by C-G formula based on SCr of 0.75 mg/dL). Liver Function Tests: Recent Labs  Lab 05/10/21 0724 05/12/21 0643  AST 13* 13*  ALT 11 12  ALKPHOS 84 85  BILITOT 0.6 0.6  PROT 5.7* 5.8*  ALBUMIN 2.3* 2.4*    No results for input(s): LIPASE, AMYLASE in the last 168 hours. No results for input(s): AMMONIA in the last 168 hours. Coagulation Profile: No results for input(s): INR, PROTIME in the last 168 hours. Cardiac Enzymes: No results for input(s): CKTOTAL, CKMB, CKMBINDEX, TROPONINI in the last 168 hours. BNP (last 3 results) No results for input(s): PROBNP in the last 8760 hours. HbA1C: No results for input(s): HGBA1C in the last 72 hours. CBG: No results for input(s): GLUCAP in the last  168 hours. Lipid Profile: No results for input(s): CHOL, HDL, LDLCALC, TRIG, CHOLHDL, LDLDIRECT in the last 72 hours. Thyroid Function Tests: No results for input(s): TSH, T4TOTAL, FREET4, T3FREE, THYROIDAB in the last 72 hours. Anemia Panel: No results for input(s): VITAMINB12, FOLATE, FERRITIN, TIBC, IRON, RETICCTPCT in the last 72 hours. Sepsis Labs: No results for input(s): PROCALCITON, LATICACIDVEN in the last 168 hours.  No results found for this or any previous visit (from the past 240 hour(s)).    Radiology Studies: MR THORACIC SPINE W WO CONTRAST  Result Date: 05/15/2021 CLINICAL DATA:  T11 metastatic disease.  Right upper lobe lung mass. EXAM: MRI THORACIC WITHOUT AND WITH CONTRAST TECHNIQUE: Multiplanar and multiecho pulse sequences of the thoracic spine were obtained without and with intravenous contrast. CONTRAST:  6.3mL GADAVIST GADOBUTROL 1 MMOL/ML IV SOLN COMPARISON:  CT chest dated May 03, 2021. FINDINGS: Alignment:  Physiologic. Vertebrae: Multiple thoracic vertebral body metastatic lesions. Pathologic fracture of the T11 vertebral body with up to 35% height loss. Thin anterior epidural tumor extension with associated enhancement (series 10, image 12). No other sites of epidural extension. Cord:  Normal signal and morphology.  No intrathecal enhancement. Paraspinal and other soft tissues: Large right upper lobe mass. 2.1 cm cystic lesion in the pancreatic tail. Disc levels: No significant disc bulge or herniation. Bulging of the T11 posterior vertebral body and overlying anterior epidural tumor extension results in moderate spinal canal stenosis with slight cord flattening. No neuroforaminal stenosis at any level. IMPRESSION: 1. Multiple thoracic vertebral body metastases again noted. Pathologic fracture of the T11 vertebral body with up to 35% height loss. Bulging of the posterior vertebral body margin with overlying thin anterior epidural tumor extension results in moderate  spinal canal stenosis at this level. No cord signal abnormality. 2. Right upper lobe lung cancer as seen on prior studies. 3. 2.1 cm cystic lesion in the pancreatic tail. Please see prior CT abdomen and pelvis report from 05/03/2021 for follow-up recommendations. Electronically Signed   By: Titus Dubin M.D.   On: 05/15/2021 17:29   DG Chest Port 1 View  Result Date: 05/14/2021 CLINICAL DATA:  Pneumothorax after right lung biopsy EXAM: PORTABLE CHEST 1 VIEW COMPARISON:  04/18/2021, 05/14/2021 FINDINGS: Single frontal view of the chest demonstrates an unremarkable cardiac silhouette. Previously identified right upper lobe mass again seen. No new airspace disease. No evidence of pleural effusion. I do not see any pneumothorax after recent right lung biopsy. No acute bony abnormalities. IMPRESSION: 1. No complication after right lung biopsy. 2. Stable right lung mass. Electronically Signed   By: Randa Ngo M.D.   On: 05/14/2021 15:21   CT LUNG MASS BIOPSY  Result Date: 05/14/2021 CLINICAL  DATA:  Posterior right upper lobe lung mass measuring up to approximately 3.8 cm by CT. Bronchoscopy on 05/07/2021 with sampling was not conclusive for malignancy. EXAM: CT GUIDED CORE BIOPSY OF RIGHT UPPER LOBE LUNG MASS ANESTHESIA/SEDATION: Moderate (conscious) sedation was employed during this procedure. A total of Versed 2.0 mg and Fentanyl 50 mcg was administered intravenously by radiology nursing. Moderate Sedation Time: 39 minutes. The patient's level of consciousness and vital signs were monitored continuously by radiology nursing throughout the procedure under my direct supervision. PROCEDURE: The procedure risks, benefits, and alternatives were explained to the patient. Questions regarding the procedure were encouraged and answered. The patient understands and consents to the procedure. A time-out was performed prior to initiating the procedure. Initial CT was performed through the chest in a prone position. The  right posterior and posterolateral chest wall was prepped with chlorhexidine in a sterile fashion, and a sterile drape was applied covering the operative field. A sterile gown and sterile gloves were used for the procedure. Local anesthesia was provided with 1% Lidocaine. Under CT guidance, past a 17 gauge trocar needle was advanced to the level of a posterior right upper lobe lung mass. After confirming needle tip position, an 18 gauge core biopsy sample was obtained. Additional CT was performed. Aspiration of air was then performed in the posterior right pleural space through the outer needle utilizing a 3 way stopcock and syringe. Additional CT was performed. COMPLICATIONS: Small right posterior pneumothorax treated with needle aspiration. FINDINGS: Initial imaging again demonstrates a solid and lobulated oval-shaped mass in the posterior right upper lobe that abuts the major fissure and posterolateral pleural surface. There was significant difficulty in approaching the mass percutaneously due to overlying ribs and the scapula. Eventually a window was available along the lateral aspect of the mass for biopsy which successfully yielded a single intact core biopsy sample. After the first biopsy sample was obtained, there was development of a small posterior pneumothorax. This was treated with needle aspiration resulting in diminished size of the pneumothorax. Due to pneumothorax development, further core biopsy samples were not attempted to be attained. IMPRESSION: CT-guided core biopsy of right upper lobe lung mass. Approach to the mass was difficult due to intervening scapula and ribs. After a single core biopsy was obtained, there was development of a small pneumothorax which was treated with needle aspiration. Electronically Signed   By: Aletta Edouard M.D.   On: 05/14/2021 16:36    Scheduled Meds:  sodium chloride   Intravenous Once   atenolol  100 mg Oral BID   feeding supplement  237 mL Oral TID BM    fentaNYL  1 patch Transdermal Q72H   lidocaine  1 patch Transdermal Q24H   multivitamin with minerals  1 tablet Oral Daily   pantoprazole  40 mg Oral BID AC   polyethylene glycol  17 g Oral Daily   sodium chloride flush  3 mL Intravenous Q12H   Continuous Infusions:   LOS: 13 days   Time spent: 34 minutes   Darliss Cheney, MD [facility]  05/16/2021, 12:08 PM  Please page via [facility] and do not message via secure chat for anything urgent. Secure chat can be used for anything non urgent.  How to contact the [facility] Attending or Consulting provider [place] or covering provider during after hours [place], for this patient?  Check the care team in [facility] and look for a) attending/consulting [facility] provider listed and b) the [facility] team listed. Page or  secure chat [phone]. Log into [email or web address] and use [place] universal password to access. If you do not have the password, please contact the hospital operator. Locate the [facility] provider you are looking for under [facility] and page to a number that you can be directly reached. If you still have difficulty reaching the provider, please page the [facility] (Director on Call) for the Hospitalists listed on amion for assistance.

## 2021-05-16 NOTE — Consult Note (Addendum)
Radiation Oncology         [phone] ________________________________  Name: Robin Estrada        MRN: [id number]  Date of Service: 05/16/21 DOB: 12-03-1945  QI:[id number], Evie Lacks, MD  No ref. provider found     REFERRING PHYSICIAN: No ref. provider found   DIAGNOSIS: The primary encounter diagnosis was Anemia due to chronic blood loss. Diagnoses of Elevated troponin, Upper abdominal pain, Unintended weight loss, GI bleed, Status post bronchoscopy, S/P bronchoscopy, Lung mass, and Pneumothorax of right lung after biopsy were also pertinent to this visit.   HISTORY OF PRESENT ILLNESS: Robin Estrada is a 76 y.o. female seen at the request of Dr. Doristine Bosworth with the [facility]. 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 Dr. Jana Hakim did not offer chemotherapy. She was lost to follow up until presenting to the hospital on 05/02/21 with nausea, vomiting, weakness, headache, loss of appetite and weight loss in the prior 3 months. She has had multiple interventions since then by multiple services but has been found to have a GI bleed with anemia of acute blood loss, a large right  lung mass, what appears by CT to be widespread bony metastases, abnormal liver enhancement, mediastinal adenopathy, small pleural effusions and trace ascites, and a cystic mass in the tail of the pancreas. She has undergone EGD showing a prepyloric gastric ulcer, and biopsies were negative for malignancy of the duodenum and stomach. Colonoscopy showed a 2 cm polyp in the cecum and 1cm polyp in the sigmoid colon, path pending, and bronchoscopy with EBUS showed no malignancy in the brushings, lavage, FNA of station 7, 4R or RUL biopsies. She underwent CT biopsy on 05/14/21 of the lung and path is pending.  An MRI of the Thoracic spine performed yesterday showed a T11 lesion as  well as disease at multiple levels of the thoracic spine, and compression fracture of T11 with 35% loss of height. The disease in the T11 body did show bulging posteriorly into the spinal canal without cord signal abnormality, but canal stenosis and concerns for impending cord compression. Dr. Ellene Route does not recommend surgical intervention. She's contacted to discuss palliative radiotherapy.    PREVIOUS RADIATION THERAPY:   11/25/2017 - 01/08/2018: The patient initially received a dose of 50.4 Gy in 28 fractions to the left chest wall and left supraclavicular region. This was delivered using a 3-D conformal, 4 field technique. The patient then received a boost to the mastectomy scar. This delivered an additional 10 Gy in 5 fractions using an en face electron field. The total dose was 60.4 Gy.  PAST MEDICAL HISTORY:  Past Medical History:  Diagnosis Date   Breast mass, left    Large   Cancer ([place])    FIBROMYOMA  AGE 74-   BENIGN  RESOLVED   Headache    Heart murmur    Hypertension    Mitral valve prolapse    Tuberculosis    AS CHILD  W/ TX       PAST SURGICAL HISTORY: Past Surgical History:  Procedure Laterality Date   BIOPSY  05/04/2021   Procedure: BIOPSY;  Surgeon: Irving Copas., MD;  Location: [place];  Service: Gastroenterology;;   BRONCHIAL BIOPSY  05/07/2021   Procedure: BRONCHIAL BIOPSIES;  Surgeon: Margaretha Seeds, MD;  Location: [place];  Service: Pulmonary;;   BRONCHIAL BRUSHINGS  05/07/2021  Procedure: BRONCHIAL BRUSHINGS;  Surgeon: Margaretha Seeds, MD;  Location: [facility] ENDOSCOPY;  Service: Pulmonary;;   BRONCHIAL NEEDLE ASPIRATION BIOPSY  05/07/2021   Procedure: BRONCHIAL NEEDLE ASPIRATION BIOPSIES;  Surgeon: Margaretha Seeds, MD;  Location: [facility] ENDOSCOPY;  Service: Pulmonary;;   BRONCHIAL WASHINGS  05/07/2021   Procedure: BRONCHIAL WASHINGS;  Surgeon: Margaretha Seeds, MD;  Location: [place];  Service: Pulmonary;;   COLONOSCOPY WITH  PROPOFOL N/A 05/09/2021   Procedure: COLONOSCOPY WITH PROPOFOL;  Surgeon: Ladene Artist, MD;  Location: [facility] ENDOSCOPY;  Service: Endoscopy;  Laterality: N/A;   ESOPHAGOGASTRODUODENOSCOPY N/A 05/04/2021   Procedure: ESOPHAGOGASTRODUODENOSCOPY (EGD);  Surgeon: Irving Copas., MD;  Location: [place];  Service: Gastroenterology;  Laterality: N/A;   HEMOSTASIS CONTROL  05/07/2021   Procedure: HEMOSTASIS CONTROL;  Surgeon: Margaretha Seeds, MD;  Location: [facility] ENDOSCOPY;  Service: Pulmonary;;   MASS EXCISION Left 08/14/2017   Procedure: PARTIAL EXCISION  LEFT BREAST  MASS ERAS PATHWAY;  Surgeon: Fanny Skates, MD;  Location: [place];  Service: General;  Laterality: Left;   MASTECTOMY W/ SENTINEL NODE BIOPSY Left 09/14/2017   Procedure: LEFT TOTAL MASTECTOMY WITH SENTINEL LYMPH NODE BIOPSY;  Surgeon: Fanny Skates, MD;  Location: [place];  Service: General;  Laterality: Left;   NO PAST SURGERIES     POLYPECTOMY  05/09/2021   Procedure: POLYPECTOMY;  Surgeon: Ladene Artist, MD;  Location: [facility] ENDOSCOPY;  Service: Endoscopy;;   SUBMUCOSAL TATTOO INJECTION  05/09/2021   Procedure: SUBMUCOSAL TATTOO INJECTION;  Surgeon: Ladene Artist, MD;  Location: [place];  Service: Endoscopy;;   VIDEO BRONCHOSCOPY WITH ENDOBRONCHIAL ULTRASOUND N/A 05/07/2021   Procedure: VIDEO BRONCHOSCOPY WITH ENDOBRONCHIAL ULTRASOUND;  Surgeon: Margaretha Seeds, MD;  Location: [place];  Service: Pulmonary;  Laterality: N/A;     FAMILY HISTORY:  Family History  Problem Relation Age of Onset   Stroke Mother    Stroke Maternal Grandmother    Breast cancer Sister      SOCIAL HISTORY:  reports that she has never smoked. She has never used smokeless tobacco. She reports current alcohol use. She reports that she does not use drugs. The patient is divorced and lives in [place] with her son. 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    ALLERGIES: Statins, Amlodipine,  Clonidine derivatives, Losartan, Sulfa antibiotics, and Sulfamethoxazole-trimethoprim   MEDICATIONS:  Current Facility-Administered Medications  Medication Dose Route Frequency Provider Last Rate Last Admin   acetaminophen (TYLENOL) tablet 650 mg  650 mg Oral Q6H PRN Fuller Plan A, MD       Or   acetaminophen (TYLENOL) suppository 650 mg  650 mg Rectal Q6H PRN Fuller Plan A, MD       albuterol (PROVENTIL) (2.5 MG/3ML) 0.083% nebulizer solution 2.5 mg  2.5 mg Nebulization Q6H PRN Tamala Julian, Rondell A, MD   2.5 mg at 05/08/21 0821   atenolol (TENORMIN) tablet 100 mg  100 mg Oral BID Fuller Plan A, MD   100 mg at 05/16/21 0842   feeding supplement (ENSURE ENLIVE / ENSURE PLUS) liquid 237 mL  237 mL Oral TID BM Samuella Cota, MD   237 mL at 05/13/21 2100   fentaNYL (DURAGESIC) 12 MCG/HR 1 patch  1 patch Transdermal Q72H Annita Brod, MD   1 patch at 05/14/21 1802   hydrALAZINE (APRESOLINE) injection 10 mg  10 mg Intravenous Q4H PRN Norval Morton, MD       HYDROcodone-acetaminophen (NORCO/VICODIN) 5-325 MG per tablet  1 tablet  1 tablet Oral Q6H PRN Samuella Cota, MD   1 tablet at 05/16/21 0843   lidocaine (LIDODERM) 5 % 1 patch  1 patch Transdermal Q24H Annita Brod, MD   1 patch at 05/15/21 1735   morphine 2 MG/ML injection 2 mg  2 mg Intravenous Q3H PRN Samuella Cota, MD   2 mg at 05/15/21 2117   multivitamin with minerals tablet 1 tablet  1 tablet Oral Daily Annita Brod, MD   1 tablet at 05/16/21 0843   ondansetron (ZOFRAN) tablet 4 mg  4 mg Oral Q6H PRN Norval Morton, MD       Or   ondansetron (ZOFRAN) injection 4 mg  4 mg Intravenous Q6H PRN Fuller Plan A, MD   4 mg at 05/09/21 1248   pantoprazole (PROTONIX) EC tablet 40 mg  40 mg Oral BID AC Mansouraty, Telford Nab., MD   40 mg at 05/16/21 0842   polyethylene glycol (MIRALAX / GLYCOLAX) packet 17 g  17 g Oral Daily Annita Brod, MD   17 g at 05/13/21 1001   sodium chloride flush (NS) 0.9 %  injection 3 mL  3 mL Intravenous Q12H Fuller Plan A, MD   3 mL at 05/16/21 0943     REVIEW OF SYSTEMS: On review of systems, the patient reports that she is tired and weak overall due to her anemia. She denies loss of control of extremities, or any loss of sensation. She describes pain that worsens with standing in her back. She has felt better in terms of her GI symptoms and has been able to tolerate eating today. Her pain is relieved by her pain medication, but it has taken a while to get a regimen with good relief. She's had more than 30 pounds of unintended weight loss in the last year. No other complaints are noted during the discussion.  PHYSICAL EXAM:  Wt Readings from Last 3 Encounters:  05/09/21 147 lb 14.9 oz (67.1 kg)  11/29/20 171 lb (77.6 kg)  08/30/20 177 lb 9.6 oz (80.6 kg)   Temp Readings from Last 3 Encounters:  05/16/21 97.7 F (36.5 C) (Oral)  11/29/20 97.8 F (36.6 C) (Oral)  08/30/20 98.3 F (36.8 C) (Oral)   BP Readings from Last 3 Encounters:  05/16/21 (!) 144/52  11/29/20 (!) 168/78  08/30/20 (!) 162/88   Pulse Readings from Last 3 Encounters:  05/16/21 71  11/29/20 71  08/30/20 67   Pain Assessment Pain Score: 6 /10  Unable to assess due to encounter type.   ECOG = 1  0 - Asymptomatic (Fully active, able to carry on all predisease activities without restriction)  1 - Symptomatic but completely ambulatory (Restricted in physically strenuous activity but ambulatory and able to carry out work of a light or sedentary nature. For example, light housework, office work)  2 - Symptomatic, <50% in bed during the day (Ambulatory and capable of all self care but unable to carry out any work activities. Up and about more than 50% of waking hours)  3 - Symptomatic, >50% in bed, but not bedbound (Capable of only limited self-care, confined to bed or chair 50% or more of waking hours)  4 - Bedbound (Completely disabled. Cannot carry on any self-care. Totally  confined to bed or chair)  5 - Death   Eustace Pen MM, Creech RH, Tormey DC, et al. [phone]). "Toxicity and response criteria of the [facility]". [place] Oncol.  5 (6): 649-55    LABORATORY DATA:  Lab Results  Component Value Date   WBC 11.2 (H) 05/16/2021   HGB 7.0 (L) 05/16/2021   HCT 23.4 (L) 05/16/2021   MCV 73.1 (L) 05/16/2021   PLT 510 (H) 05/16/2021   Lab Results  Component Value Date   NA 132 (L) 05/16/2021   K 3.6 05/16/2021   CL 96 (L) 05/16/2021   CO2 26 05/16/2021   Lab Results  Component Value Date   ALT 12 05/12/2021   AST 13 (L) 05/12/2021   ALKPHOS 85 05/12/2021   BILITOT 0.6 05/12/2021      RADIOGRAPHY: DG Chest 2 View  Result Date: 05/03/2021 CLINICAL DATA:  Weakness history of breast mass, initial encounter EXAM: CHEST - 2 VIEW COMPARISON:  11/09/2017 FINDINGS: Check shadow is mildly enlarged. The lungs are well aerated bilaterally. There is a 3.9 x 3.0 cm rounded soft tissue mass lesion projecting in the posterior aspect of the right upper lobe along the major fissure. This was not seen on the prior exam and is felt to represent a primary pulmonary neoplasm till proven otherwise. No bony abnormality is noted. IMPRESSION: Right upper lobe mass. CT of the chest with contrast is recommended for further evaluation. Electronically Signed   By: Inez Catalina M.D.   On: 05/03/2021 00:09   CT Head Wo Contrast  Result Date: 05/03/2021 CLINICAL DATA:  76 year old female with headache weakness, decreased P.O. Right side flank pain for 3 months. EXAM: CT HEAD WITHOUT CONTRAST TECHNIQUE: Contiguous axial images were obtained from the base of the skull through the vertex without intravenous contrast. COMPARISON:  Head CT 06/02/2015. FINDINGS: Brain: Cerebral volume is within normal limits for age. No midline shift, ventriculomegaly, mass effect, evidence of mass lesion, intracranial hemorrhage or evidence of cortically based acute infarction.  Gray-white matter differentiation is within normal limits throughout the brain. Vascular: Mild Calcified atherosclerosis at the skull base. No suspicious intracranial vascular hyperdensity. Skull: Stable, negative. Sinuses/Orbits: Visualized paranasal sinuses and mastoids are clear. Tympanic cavities are clear. Other: Visualized orbits and scalp soft tissues are within normal limits. IMPRESSION: Normal for age noncontrast Head CT. Electronically Signed   By: Genevie Ann M.D.   On: 05/03/2021 04:49   CT CHEST W CONTRAST  Result Date: 05/03/2021 CLINICAL DATA:  Concern for lung nodule.  Abnormal x-ray EXAM: CT CHEST WITH CONTRAST TECHNIQUE: Multidetector CT imaging of the chest was performed during intravenous contrast administration. CONTRAST:  53mL OMNIPAQUE IOHEXOL 300 MG/ML  SOLN COMPARISON:  Chest x-ray earlier the same day FINDINGS: Cardiovascular: Heart is enlarged. No pericardial effusion identified. Main pulmonary artery is normal caliber. Thoracic aorta is normal caliber with mild atherosclerotic plaques. Mediastinum/Nodes: A few enlarged mediastinal lymph nodes measuring up to 11 mm in short axis in the superior mediastinum and 10 mm in short axis precarinal. Enlarged right hilar lymph node measuring 11 mm in short axis. No axillary lymphadenopathy identified. Subcentimeter right thyroid lobe nodule noted. Lungs/Pleura: Lungs are hyperinflated with mild emphysematous changes and mild hazy mosaic attenuation throughout. There is a solid centrally necrotic mass identified in the posterior aspect of the right upper lobe which measures 2.7 x 3.8 x 2.8 cm and partially abuts the major fissure and the posterolateral pleura. The mass demonstrates lobulated slightly irregular borders. 6 mm pleural-based pulmonary nodule in the posterior aspect of the right upper lobe, superior to the mass. A few small adjacent pulmonary nodular densities in the left upper lobe subpleural measuring up to  4 mm in size. Small right  and trace left pleural effusions. No pneumothorax. Upper Abdomen: No acute process identified in the visualized upper abdomen. Nodular contour of the liver suggesting cirrhosis. At least 3 cystic density lesions in the pancreas measuring up to 16 mm in the tail the pancreas. Musculoskeletal: Multiple lytic likely metastatic bone lesions identified in the spine and ribs. Largest lytic lesion is in the T11 vertebral body which is expansile and extends 8 mm dorsal to the posterior vertebral body margin and causes moderate to severe spinal canal stenosis. IMPRESSION: 1. Solid and centrally necrotic lung mass in the right upper lobe as described, consistent with primary malignancy. 2. Emphysematous changes of the lungs. A few additional small pulmonary nodules bilaterally. 3. Small right and trace left pleural effusions. 4. Mediastinal and right hilar mild adenopathy, possibly metastatic. 5. Multiple lytic osseous bone metastases. Most notably a dorsally expansile lesion in the T11 vertebral body which causes moderate to severe spinal canal stenosis. Correlate clinically for need for MRI evaluation. 6. Chronic findings in the abdomen including hepatic cirrhosis and at least 3 hypodense cystic lesions in the pancreas which may represent cysts, pseudocysts, or side branch IPMN. Aortic Atherosclerosis (ICD10-I70.0) and Emphysema (ICD10-J43.9). Electronically Signed   By: Ofilia Neas M.D.   On: 05/03/2021 10:34   MR THORACIC SPINE W WO CONTRAST  Result Date: 05/15/2021 CLINICAL DATA:  T11 metastatic disease.  Right upper lobe lung mass. EXAM: MRI THORACIC WITHOUT AND WITH CONTRAST TECHNIQUE: Multiplanar and multiecho pulse sequences of the thoracic spine were obtained without and with intravenous contrast. CONTRAST:  6.18mL GADAVIST GADOBUTROL 1 MMOL/ML IV SOLN COMPARISON:  CT chest dated May 03, 2021. FINDINGS: Alignment:  Physiologic. Vertebrae: Multiple thoracic vertebral body metastatic lesions. Pathologic  fracture of the T11 vertebral body with up to 35% height loss. Thin anterior epidural tumor extension with associated enhancement (series 10, image 12). No other sites of epidural extension. Cord:  Normal signal and morphology.  No intrathecal enhancement. Paraspinal and other soft tissues: Large right upper lobe mass. 2.1 cm cystic lesion in the pancreatic tail. Disc levels: No significant disc bulge or herniation. Bulging of the T11 posterior vertebral body and overlying anterior epidural tumor extension results in moderate spinal canal stenosis with slight cord flattening. No neuroforaminal stenosis at any level. IMPRESSION: 1. Multiple thoracic vertebral body metastases again noted. Pathologic fracture of the T11 vertebral body with up to 35% height loss. Bulging of the posterior vertebral body margin with overlying thin anterior epidural tumor extension results in moderate spinal canal stenosis at this level. No cord signal abnormality. 2. Right upper lobe lung cancer as seen on prior studies. 3. 2.1 cm cystic lesion in the pancreatic tail. Please see prior CT abdomen and pelvis report from 05/03/2021 for follow-up recommendations. Electronically Signed   By: Titus Dubin M.D.   On: 05/15/2021 17:29   CT ABDOMEN PELVIS W CONTRAST  Result Date: 05/03/2021 CLINICAL DATA:  76 year old female with headache weakness, decreased P.O. Right side flank pain for 3 months. 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 EXAM: CT ABDOMEN AND PELVIS WITH CONTRAST TECHNIQUE: Multidetector CT imaging of the abdomen and pelvis was performed using the standard protocol following bolus administration of intravenous contrast. CONTRAST:  174mL OMNIPAQUE IOHEXOL 300 MG/ML  SOLN COMPARISON:  Chest CT 11/10/2017. FINDINGS: Lower chest: Small layering pleural effusions are new since 2019, larger on the right with simple fluid density. No pericardial effusion. Cardiac size  now at the  upper limits of normal. Mild lung base atelectasis. Hepatobiliary: Coarse, heterogeneous liver enhancement is new since 2019. This has an infiltrative appearance, without a discrete liver mass or lesion. Gallbladder remains within normal limits. Pancreas: Small circumscribed simple fluid density cystic mass at the tail of the pancreas is 18 mm on series 5, image 27. Elsewhere the pancreatic parenchyma appears somewhat atrophied. No regional inflammation. Spleen: Negative. Adrenals/Urinary Tract: Normal adrenal glands. Symmetric renal enhancement and contrast excretion. Distended but otherwise unremarkable bladder. Stomach/Bowel: Redundant but decompressed large bowel in the abdomen and pelvis. There is retained stool in the right colon. Normal appendix on coronal image 45. Cecum appears mildly distended with contrast. Terminal ileum is decompressed. No dilated small bowel. Negative stomach and duodenum. No free air. No abdominal free fluid. Vascular/Lymphatic: Aortoiliac calcified atherosclerosis. Major arterial structures in the abdomen and pelvis are patent. Portal venous system is patent. No lymphadenopathy. Reproductive: Small uterine fibroids, otherwise within normal limits. Other: There is trace free fluid in the cul-de-sac on series 5, image 73. Musculoskeletal: Lytic lesion with posterior expansion, retropulsion of tumor from the T11 vertebral body (sagittal image 58). Mild to moderate associated spinal stenosis there (series 5, image 19). Mild associated T11 loss of height, compression. Right side T8 vertebral body roughly 11 mm lytic lesion is also new since 2019 on sagittal image 57. Similar lytic lesions in the L4 and S1 bodies. Visible ribs appear intact. There is a 2 cm lytic lesion of the medial left iliac bone with probable early extraosseous extension on series 5, image 60. Elsewhere the pelvis appears intact. IMPRESSION: 1. Fairly widespread osseous metastatic disease, with lytic lesions in the  spine and pelvis. Pathologic T11 compression fracture with epidural extension of tumor resulting in mild to moderate spinal stenosis. If there are myelopathic symptoms consider T11 cord compression. 2. Heterogeneous enhancement of the liver is new since 2019. The appearance is infiltrative and in light of #1 hepatic metastatic disease cannot be excluded although no discrete liver mass. Alternatively consider hepatitis or other intrinsic hepatocellular disease. 3. Small layering pleural effusions. Trace free fluid in the pelvis. 4. Small 1.8 cm cystic mass at the tail of the pancreas, with some background pancreatic atrophy. This is indeterminate and meets consensus criteria for re-imaging q6 months x4 to evaluate growth versus stability. 5. Aortic Atherosclerosis (ICD10-I70.0). Electronically Signed   By: Genevie Ann M.D.   On: 05/03/2021 04:59   DG Chest Port 1 View  Result Date: 05/14/2021 CLINICAL DATA:  Pneumothorax after right lung biopsy EXAM: PORTABLE CHEST 1 VIEW COMPARISON:  04/18/2021, 05/14/2021 FINDINGS: Single frontal view of the chest demonstrates an unremarkable cardiac silhouette. Previously identified right upper lobe mass again seen. No new airspace disease. No evidence of pleural effusion. I do not see any pneumothorax after recent right lung biopsy. No acute bony abnormalities. IMPRESSION: 1. No complication after right lung biopsy. 2. Stable right lung mass. Electronically Signed   By: Randa Ngo M.D.   On: 05/14/2021 15:21   DG CHEST PORT 1 VIEW  Result Date: 05/08/2021 CLINICAL DATA:  Bronchoscopy yesterday, cough EXAM: PORTABLE CHEST 1 VIEW COMPARISON:  Chest radiograph 1 day prior, CT chest 05/03/2021 FINDINGS: The cardiomediastinal silhouette is stable. The approximally 4.6 cm mass in the right upper lobe is unchanged. Opacities surrounding this mass seen on the prior radiograph are decreased in conspicuity. There is no new or worsening focal airspace disease. There is no pleural  effusion. There is no pneumothorax. The  bones are stable. IMPRESSION: Interval decreased in conspicuity of patchy opacities surrounding the right upper lobe mass compared to the study from 1 day prior. No new or worsening focal airspace disease. No pneumothorax. Electronically Signed   By: Valetta Mole M.D.   On: 05/08/2021 08:45   DG CHEST PORT 1 VIEW  Result Date: 05/07/2021 CLINICAL DATA:  Status post bronchoscopy EXAM: PORTABLE CHEST 1 VIEW COMPARISON:  05/02/2021 FINDINGS: New patchy alveolar densities seen in the right upper lung fields. Previously seen 4 cm nodular density in the right upper lung fields is obscured by the new infiltrate. Findings suggest possible pulmonary hemorrhage related to bronchoscopic biopsy. Rest of the lung fields are unremarkable. There is no pleural effusion or pneumothorax. IMPRESSION: New alveolar infiltrate is seen in the right upper lung fields possibly suggesting pulmonary hemorrhage related to bronchoscopic biopsy. Electronically Signed   By: Elmer Picker M.D.   On: 05/07/2021 12:00   CT LUNG MASS BIOPSY  Result Date: 05/14/2021 CLINICAL DATA:  Posterior right upper lobe lung mass measuring up to approximately 3.8 cm by CT. Bronchoscopy on 05/07/2021 with sampling was not conclusive for malignancy. EXAM: CT GUIDED CORE BIOPSY OF RIGHT UPPER LOBE LUNG MASS ANESTHESIA/SEDATION: Moderate (conscious) sedation was employed during this procedure. A total of Versed 2.0 mg and Fentanyl 50 mcg was administered intravenously by radiology nursing. Moderate Sedation Time: 39 minutes. The patient's level of consciousness and vital signs were monitored continuously by radiology nursing throughout the procedure under my direct supervision. PROCEDURE: The procedure risks, benefits, and alternatives were explained to the patient. Questions regarding the procedure were encouraged and answered. The patient understands and consents to the procedure. A time-out was performed prior  to initiating the procedure. Initial CT was performed through the chest in a prone position. The right posterior and posterolateral chest wall was prepped with chlorhexidine in a sterile fashion, and a sterile drape was applied covering the operative field. A sterile gown and sterile gloves were used for the procedure. Local anesthesia was provided with 1% Lidocaine. Under CT guidance, past a 17 gauge trocar needle was advanced to the level of a posterior right upper lobe lung mass. After confirming needle tip position, an 18 gauge core biopsy sample was obtained. Additional CT was performed. Aspiration of air was then performed in the posterior right pleural space through the outer needle utilizing a 3 way stopcock and syringe. Additional CT was performed. COMPLICATIONS: Small right posterior pneumothorax treated with needle aspiration. FINDINGS: Initial imaging again demonstrates a solid and lobulated oval-shaped mass in the posterior right upper lobe that abuts the major fissure and posterolateral pleural surface. There was significant difficulty in approaching the mass percutaneously due to overlying ribs and the scapula. Eventually a window was available along the lateral aspect of the mass for biopsy which successfully yielded a single intact core biopsy sample. After the first biopsy sample was obtained, there was development of a small posterior pneumothorax. This was treated with needle aspiration resulting in diminished size of the pneumothorax. Due to pneumothorax development, further core biopsy samples were not attempted to be attained. IMPRESSION: CT-guided core biopsy of right upper lobe lung mass. Approach to the mass was difficult due to intervening scapula and ribs. After a single core biopsy was obtained, there was development of a small pneumothorax which was treated with needle aspiration. Electronically Signed   By: Aletta Edouard M.D.   On: 05/14/2021 16:36        IMPRESSION/PLAN: 1. 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 Dr. Lisbeth Renshaw is aware of the patient's case and we reviewed her imaging and work up to date. She has multiple other issues ongoing including GI ulcer and anemia as a result. She is working with pain management as well. She is aware that pathology is looking at her prior specimen as well but that this is likely recurrence of the same cancer she had in 2019. She is not clinically complaining of symptoms of cord compression, but we discussed the risks of progression that could lead to a permanent spinal cord injury. Dr. Lisbeth Renshaw offers a course of palliative radiotherapy to the T11 spine. We discussed the risks, benefits, short, and long term effects of radiotherapy, as well as the curative intent, and the patient is interested in proceeding with simulation tomorrow coordinated by [facility]. She is interested in having her path reviewed for second opinion at [place] just as was done in 2019. She would like to wait on starting treatment until some clarity of her biopsies taken during this hospitalization can be made.  I discussed the delivery and logistics of radiotherapy and anticipates a course of 2 weeks of radiotherapy. If clarity on pathology can be given she would agree to proceed with radiation on Monday. I stressed that Dr. Lisbeth Renshaw does not feel that we have to delay radiation for pathology. She will sign consent with her son interpreting tomorrow morning. As she does not have neurologic symptoms of cord compression, we will hold off on Dexamethasone but would have a low threshold on starting 4mg  Dexamethasone TID if symptoms began. She is in agreement to let her care team know if she develops symptoms.   In a visit lasting 90 minutes, greater than 50% of the time was spent by phone and in  floor time discussing the patient's condition, in preparation for the discussion, and coordinating the  patient's care.    Carola Rhine, [facility]   **Disclaimer: This note was dictated with voice recognition software. Similar sounding words can inadvertently be transcribed and this note may contain transcription errors which may not have been corrected upon publication of note.**

## 2021-05-16 NOTE — Consult Note (Addendum)
[place]  [phone] [phone]   MEDICAL ONCOLOGY - INITIAL CONSULTATION  Referral MD: Dr. Kyung Rudd  Reason for Referral: Right upper lobe lung mass, mediastinal and hilar adenopathy, lytic bone lesions, infiltrative appearance of the liver concerning for metastatic disease  HPI: Ms. 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  She presented to the emergency department with complaints of weakness.  Patient has been having a progressive decline over the past 3 months with loss of appetite and weight loss of about 30 pounds.  In the 2 to 3 days prior to admission, she reported a more acute decline in her condition where she felt unwell and was unable to go upstairs due to weakness.  She has also been experiencing some shortness of breath with exertion, low back pain, constipation, and abdominal pain with eating.  Her son also reported that she has had a lingering nonproductive cough since about August 2022.  Admission lab work notable for a WBC of 14, hemoglobin 6.9, platelet count 679,000, sodium 128, albumin 3.1.  Stool for occult blood was positive.  She has received 2 units PRBC so far this admission.  Vitamin B12 and folate levels are adequate this admission.  Her ferritin was low normal at 22 and percent saturation is low at 10%.  Chest x-ray concerning for a right upper lobe mass.  CT head normal.  CT abdomen/pelvis with contrast showed fairly widespread osseous metastatic disease with lytic lesions in the spine and pelvis, pathologic T11 compression fracture with epidural extension of tumor resulting in mild to moderate spinal stenosis, heterogeneous enhancement of the liver which appears infiltrative and given  findings of bone lesions, hepatic metastatic disease cannot be excluded although there is no discrete liver mass seen, small layering pleural effusions and trace fluid in the pelvis, small 1.8 cm cystic mass at the tail of the pancreas with some background pancreatic atrophy which is indeterminate.  CT chest with contrast showed a solid and centrally necrotic lung mass in the right upper lobe consistent with primary malignancy, emphysematous changes of the lungs and a few additional small pulmonary nodules bilaterally, small right and trace left pleural effusions, mediastinal and right hilar adenopathy, multiple lytic osseous bone metastases were again seen, chronic findings in the abdomen including hepatic cirrhosis and cysts in the pancreas.  She underwent EGD on 05/04/2021 which showed no gross lesions, and a nonbleeding gastric ulcer with a clean ulcer base in the prepylorus, gastritis and gastric erosions in the body/antrum/prepylorus.  Biopsies were taken of the duodenum and stomach which did not show any evidence of malignancy.  She underwent bronchoscopy on 05/07/2021 and brushings and EBUS were performed.  No malignant cells were identified.  She underwent a colonoscopy on 05/09/2021 and 2 polyps were identified and mild diverticulosis was noted.  Pathology currently pending.  She underwent CT-guided core biopsy of the right upper lobe lung mass on 05/14/2021 and biopsy results are pending.  MRI thoracic spine was performed on 05/15/2021 which showed multiple thoracic vertebral body metastases, pathologic fracture of the T11 vertebral body with up to 35% height loss, bulging of the posterior vertebral body margin with overlying thin anterior epidural tumor extension which results in moderate spinal canal stenosis.  The patient has been seen by the palliative care team  this admission and states that her goals are to remain a full code and that she would be excepting of radiation but not accepting of  chemotherapy.  With regard to the patient's prior diagnosis of phyllodes tumor, she was previously seen by Dr. Jana Hakim.  Her last visit with him was on 04/28/2018.  His last note has been reviewed.  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  She received adjuvant radiation from 11/25/2017 through 01/08/2018.  She received 10 Gy in 5 fractions of the total dose was 60.4 Gy.  The patient has not followed up since this time.   The patient has been evaluated by both radiation oncology and neurosurgery due to concern for impending cord compression.  No surgery is planned.  Radiation plans to offer a course of palliative radiation to the T11 spine.  She was seen in radiation oncology earlier today for simulation.  Awaiting pathology before initiating treatment.  She has been having nausea, vomiting, weakness, headache, loss of appetite, and weight loss over the past 3 months.  She is not reporting any lower extremity weakness or difficulty controlling her bowel or bladder.  Medical Oncology was asked to see the patient to make recommendation regarding her abnormal CT scan findings.   Past Medical History:  Diagnosis Date   Breast mass, left    Large   Cancer ([place])    FIBROMYOMA  AGE 24-   BENIGN  RESOLVED   Headache    Heart murmur    Hypertension    Mitral valve prolapse    Tuberculosis    AS CHILD  W/ TX  :   Past Surgical History:  Procedure Laterality Date   BIOPSY  05/04/2021   Procedure: BIOPSY;  Surgeon: Rush Landmark Telford Nab., MD;  Location: [facility] ENDOSCOPY;  Service: Gastroenterology;;   BRONCHIAL BIOPSY  05/07/2021   Procedure: BRONCHIAL BIOPSIES;  Surgeon: Margaretha Seeds, MD;  Location: [facility] ENDOSCOPY;  Service: Pulmonary;;   BRONCHIAL BRUSHINGS  05/07/2021   Procedure: BRONCHIAL BRUSHINGS;  Surgeon: Margaretha Seeds, MD;  Location: [facility]  ENDOSCOPY;  Service: Pulmonary;;   BRONCHIAL NEEDLE ASPIRATION BIOPSY  05/07/2021   Procedure: BRONCHIAL NEEDLE ASPIRATION BIOPSIES;  Surgeon: Margaretha Seeds, MD;  Location: [facility] ENDOSCOPY;  Service: Pulmonary;;   BRONCHIAL WASHINGS  05/07/2021   Procedure: BRONCHIAL WASHINGS;  Surgeon: Margaretha Seeds, MD;  Location: [place];  Service: Pulmonary;;   COLONOSCOPY WITH PROPOFOL N/A 05/09/2021   Procedure: COLONOSCOPY WITH PROPOFOL;  Surgeon: Ladene Artist, MD;  Location: [facility] ENDOSCOPY;  Service: Endoscopy;  Laterality: N/A;   ESOPHAGOGASTRODUODENOSCOPY N/A 05/04/2021   Procedure: ESOPHAGOGASTRODUODENOSCOPY (EGD);  Surgeon: Irving Copas., MD;  Location: [place];  Service: Gastroenterology;  Laterality: N/A;   HEMOSTASIS CONTROL  05/07/2021   Procedure: HEMOSTASIS CONTROL;  Surgeon: Margaretha Seeds, MD;  Location: [facility] ENDOSCOPY;  Service: Pulmonary;;   MASS EXCISION Left 08/14/2017   Procedure: PARTIAL EXCISION  LEFT BREAST  MASS ERAS PATHWAY;  Surgeon: Fanny Skates, MD;  Location: [place];  Service: General;  Laterality: Left;   MASTECTOMY W/ SENTINEL NODE BIOPSY Left 09/14/2017   Procedure: LEFT TOTAL MASTECTOMY WITH SENTINEL LYMPH NODE BIOPSY;  Surgeon: Fanny Skates, MD;  Location: [place];  Service: General;  Laterality: Left;   NO PAST SURGERIES     POLYPECTOMY  05/09/2021   Procedure: POLYPECTOMY;  Surgeon: Ladene Artist, MD;  Location: [place] ENDOSCOPY;  Service: Endoscopy;;   SUBMUCOSAL TATTOO INJECTION  05/09/2021   Procedure: SUBMUCOSAL TATTOO INJECTION;  Surgeon: Ladene Artist, MD;  Location: [facility] ENDOSCOPY;  Service: Endoscopy;;   VIDEO BRONCHOSCOPY WITH ENDOBRONCHIAL ULTRASOUND N/A 05/07/2021   Procedure: VIDEO BRONCHOSCOPY WITH ENDOBRONCHIAL ULTRASOUND;  Surgeon: Margaretha Seeds, MD;  Location: [place];  Service: Pulmonary;  Laterality: N/A;  :   Current Facility-Administered Medications  Medication Dose Route Frequency Provider Last Rate Last Admin    acetaminophen (TYLENOL) tablet 650 mg  650 mg Oral Q6H PRN Fuller Plan A, MD       Or   acetaminophen (TYLENOL) suppository 650 mg  650 mg Rectal Q6H PRN Fuller Plan A, MD       albuterol (PROVENTIL) (2.5 MG/3ML) 0.083% nebulizer solution 2.5 mg  2.5 mg Nebulization Q6H PRN Tamala Julian, Rondell A, MD   2.5 mg at 05/08/21 0821   atenolol (TENORMIN) tablet 100 mg  100 mg Oral BID Fuller Plan A, MD   100 mg at 05/16/21 0842   feeding supplement (ENSURE ENLIVE / ENSURE PLUS) liquid 237 mL  237 mL Oral TID BM Samuella Cota, MD   237 mL at 05/13/21 2100   fentaNYL (DURAGESIC) 12 MCG/HR 1 patch  1 patch Transdermal Q72H Annita Brod, MD   1 patch at 05/14/21 1802   hydrALAZINE (APRESOLINE) injection 10 mg  10 mg Intravenous Q4H PRN Norval Morton, MD       HYDROcodone-acetaminophen (NORCO/VICODIN) 5-325 MG per tablet 1 tablet  1 tablet Oral Q6H PRN Samuella Cota, MD   1 tablet at 05/16/21 0843   lidocaine (LIDODERM) 5 % 1 patch  1 patch Transdermal Q24H Annita Brod, MD   1 patch at 05/15/21 1735   morphine 2 MG/ML injection 2 mg  2 mg Intravenous Q3H PRN Samuella Cota, MD   2 mg at 05/15/21 2117   multivitamin with minerals tablet 1 tablet  1 tablet Oral Daily Annita Brod, MD   1 tablet at 05/16/21 0843   ondansetron (ZOFRAN) tablet 4 mg  4 mg Oral Q6H PRN Norval Morton, MD       Or   ondansetron (ZOFRAN) injection 4 mg  4 mg Intravenous Q6H PRN Fuller Plan A, MD   4 mg at 05/09/21 1248   pantoprazole (PROTONIX) EC tablet 40 mg  40 mg Oral BID AC Mansouraty, Telford Nab., MD   40 mg at 05/16/21 3235   polyethylene glycol (MIRALAX / GLYCOLAX) packet 17 g  17 g Oral Daily Annita Brod, MD   17 g at 05/13/21 1001   sodium chloride flush (NS) 0.9 % injection 3 mL  3 mL Intravenous Q12H Smith, Rondell A, MD   3 mL at 05/16/21 5732      Allergies  Allergen Reactions   Statins Nausea And Vomiting and Other (See Comments)    MUSCLE PAIN   Amlodipine      fatigue   Clonidine Derivatives     headache   Losartan     abd pain   Sulfa Antibiotics Nausea Only   Sulfamethoxazole-Trimethoprim Nausea Only    " Severe Nausea "  :   Family History  Problem Relation Age of Onset   Stroke Mother    Stroke Maternal Grandmother    Breast cancer Sister   :   Social History   Socioeconomic History   Marital status: Divorced    Spouse name: Not on file  Number of children: 2   Years of education: Not on file   Highest education level: Not on file  Occupational History   Not on file  Tobacco Use   Smoking status: Never   Smokeless tobacco: Never  Vaping Use   Vaping Use: Never used  Substance and Sexual Activity   Alcohol use: Yes    Alcohol/week: 0.0 standard drinks    Comment: OCC   Drug use: No   Sexual activity: Not on file  Other Topics Concern   Not on file  Social History Narrative   02-15-18 Unable to ask abuse questions son with her today.   Social Determinants of Health   Financial Resource Strain: Not on file  Food Insecurity: Not on file  Transportation Needs: Not on file  Physical Activity: Not on file  Stress: Not on file  Social Connections: Not on file  Intimate Partner Violence: Not on file  :  Review of Systems: A comprehensive 14 point review of systems was negative except as noted in the HPI.  Exam: Patient Vitals for the past 24 hrs:  BP Temp Temp src Pulse Resp SpO2  05/16/21 1421 (!) 144/52 97.7 F (36.5 C) Oral 71 16 98 %  05/16/21 1348 (!) 148/45 97.9 F (36.6 C) -- 71 16 96 %  05/16/21 0917 (!) 132/55 98.1 F (36.7 C) Oral 78 17 98 %  05/15/21 2059 (!) 162/60 99.1 F (37.3 C) Oral 77 -- 97 %  05/15/21 2059 (!) 162/60 99.1 F (37.3 C) Oral 77 -- 97 %  05/15/21 1739 (!) 130/42 99.1 F (37.3 C) Oral 74 16 95 %    General:  well-nourished in no acute distress.   Eyes:  no scleral icterus.   ENT:  There were no oropharyngeal lesions.    Lymphatics:  Negative cervical,  supraclavicular or axillary adenopathy.   Respiratory: lungs were clear bilaterally without wheezing or crackles.   Cardiovascular:  Regular rate and rhythm, S1/S2, without murmur, rub or gallop.  There was no pedal edema.   GI:  abdomen was soft, flat, nontender, nondistended, without organomegaly.   Musculoskeletal: Strength symmetrical in the upper and lower extremities. Skin exam was without echymosis, petichae.   Neuro exam was nonfocal. Patient was alert and oriented.  Attention was good.   Language was appropriate.  Mood was normal without depression.  Speech was not pressured.  Thought content was not tangential.     Lab Results  Component Value Date   WBC 11.2 (H) 05/16/2021   HGB 7.0 (L) 05/16/2021   HCT 23.4 (L) 05/16/2021   PLT 510 (H) 05/16/2021   GLUCOSE 120 (H) 05/16/2021   CHOL 270 (H) 08/02/2020   TRIG 173.0 (H) 08/02/2020   HDL 53.50 08/02/2020   LDLCALC 182 (H) 08/02/2020   ALT 12 05/12/2021   AST 13 (L) 05/12/2021   NA 132 (L) 05/16/2021   K 3.6 05/16/2021   CL 96 (L) 05/16/2021   CREATININE 0.75 05/16/2021   BUN 9 05/16/2021   CO2 26 05/16/2021    DG Chest 2 View  Result Date: 05/03/2021 CLINICAL DATA:  Weakness history of breast mass, initial encounter EXAM: CHEST - 2 VIEW COMPARISON:  11/09/2017 FINDINGS: Check shadow is mildly enlarged. The lungs are well aerated bilaterally. There is a 3.9 x 3.0 cm rounded soft tissue mass lesion projecting in the posterior aspect of the right upper lobe along the major fissure. This was not seen on the prior exam and is felt  to represent a primary pulmonary neoplasm till proven otherwise. No bony abnormality is noted. IMPRESSION: Right upper lobe mass. CT of the chest with contrast is recommended for further evaluation. Electronically Signed   By: Inez Catalina M.D.   On: 05/03/2021 00:09   CT Head Wo Contrast  Result Date: 05/03/2021 CLINICAL DATA:  76 year old female with headache weakness, decreased P.O. Right side  flank pain for 3 months. EXAM: CT HEAD WITHOUT CONTRAST TECHNIQUE: Contiguous axial images were obtained from the base of the skull through the vertex without intravenous contrast. COMPARISON:  Head CT 06/02/2015. FINDINGS: Brain: Cerebral volume is within normal limits for age. No midline shift, ventriculomegaly, mass effect, evidence of mass lesion, intracranial hemorrhage or evidence of cortically based acute infarction. Gray-white matter differentiation is within normal limits throughout the brain. Vascular: Mild Calcified atherosclerosis at the skull base. No suspicious intracranial vascular hyperdensity. Skull: Stable, negative. Sinuses/Orbits: Visualized paranasal sinuses and mastoids are clear. Tympanic cavities are clear. Other: Visualized orbits and scalp soft tissues are within normal limits. IMPRESSION: Normal for age noncontrast Head CT. Electronically Signed   By: Genevie Ann M.D.   On: 05/03/2021 04:49   CT CHEST W CONTRAST  Result Date: 05/03/2021 CLINICAL DATA:  Concern for lung nodule.  Abnormal x-ray EXAM: CT CHEST WITH CONTRAST TECHNIQUE: Multidetector CT imaging of the chest was performed during intravenous contrast administration. CONTRAST:  55mL OMNIPAQUE IOHEXOL 300 MG/ML  SOLN COMPARISON:  Chest x-ray earlier the same day FINDINGS: Cardiovascular: Heart is enlarged. No pericardial effusion identified. Main pulmonary artery is normal caliber. Thoracic aorta is normal caliber with mild atherosclerotic plaques. Mediastinum/Nodes: A few enlarged mediastinal lymph nodes measuring up to 11 mm in short axis in the superior mediastinum and 10 mm in short axis precarinal. Enlarged right hilar lymph node measuring 11 mm in short axis. No axillary lymphadenopathy identified. Subcentimeter right thyroid lobe nodule noted. Lungs/Pleura: Lungs are hyperinflated with mild emphysematous changes and mild hazy mosaic attenuation throughout. There is a solid centrally necrotic mass identified in the posterior  aspect of the right upper lobe which measures 2.7 x 3.8 x 2.8 cm and partially abuts the major fissure and the posterolateral pleura. The mass demonstrates lobulated slightly irregular borders. 6 mm pleural-based pulmonary nodule in the posterior aspect of the right upper lobe, superior to the mass. A few small adjacent pulmonary nodular densities in the left upper lobe subpleural measuring up to 4 mm in size. Small right and trace left pleural effusions. No pneumothorax. Upper Abdomen: No acute process identified in the visualized upper abdomen. Nodular contour of the liver suggesting cirrhosis. At least 3 cystic density lesions in the pancreas measuring up to 16 mm in the tail the pancreas. Musculoskeletal: Multiple lytic likely metastatic bone lesions identified in the spine and ribs. Largest lytic lesion is in the T11 vertebral body which is expansile and extends 8 mm dorsal to the posterior vertebral body margin and causes moderate to severe spinal canal stenosis. IMPRESSION: 1. Solid and centrally necrotic lung mass in the right upper lobe as described, consistent with primary malignancy. 2. Emphysematous changes of the lungs. A few additional small pulmonary nodules bilaterally. 3. Small right and trace left pleural effusions. 4. Mediastinal and right hilar mild adenopathy, possibly metastatic. 5. Multiple lytic osseous bone metastases. Most notably a dorsally expansile lesion in the T11 vertebral body which causes moderate to severe spinal canal stenosis. Correlate clinically for need for MRI evaluation. 6. Chronic findings in the abdomen including hepatic cirrhosis  and at least 3 hypodense cystic lesions in the pancreas which may represent cysts, pseudocysts, or side branch IPMN. Aortic Atherosclerosis (ICD10-I70.0) and Emphysema (ICD10-J43.9). Electronically Signed   By: Ofilia Neas M.D.   On: 05/03/2021 10:34   MR THORACIC SPINE W WO CONTRAST  Result Date: 05/15/2021 CLINICAL DATA:  T11  metastatic disease.  Right upper lobe lung mass. EXAM: MRI THORACIC WITHOUT AND WITH CONTRAST TECHNIQUE: Multiplanar and multiecho pulse sequences of the thoracic spine were obtained without and with intravenous contrast. CONTRAST:  6.53mL GADAVIST GADOBUTROL 1 MMOL/ML IV SOLN COMPARISON:  CT chest dated May 03, 2021. FINDINGS: Alignment:  Physiologic. Vertebrae: Multiple thoracic vertebral body metastatic lesions. Pathologic fracture of the T11 vertebral body with up to 35% height loss. Thin anterior epidural tumor extension with associated enhancement (series 10, image 12). No other sites of epidural extension. Cord:  Normal signal and morphology.  No intrathecal enhancement. Paraspinal and other soft tissues: Large right upper lobe mass. 2.1 cm cystic lesion in the pancreatic tail. Disc levels: No significant disc bulge or herniation. Bulging of the T11 posterior vertebral body and overlying anterior epidural tumor extension results in moderate spinal canal stenosis with slight cord flattening. No neuroforaminal stenosis at any level. IMPRESSION: 1. Multiple thoracic vertebral body metastases again noted. Pathologic fracture of the T11 vertebral body with up to 35% height loss. Bulging of the posterior vertebral body margin with overlying thin anterior epidural tumor extension results in moderate spinal canal stenosis at this level. No cord signal abnormality. 2. Right upper lobe lung cancer as seen on prior studies. 3. 2.1 cm cystic lesion in the pancreatic tail. Please see prior CT abdomen and pelvis report from 05/03/2021 for follow-up recommendations. Electronically Signed   By: Titus Dubin M.D.   On: 05/15/2021 17:29   CT ABDOMEN PELVIS W CONTRAST  Result Date: 05/03/2021 CLINICAL DATA:  76 year old female with headache weakness, decreased P.O. Right side flank pain for 3 months. 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 EXAM: CT ABDOMEN AND  PELVIS WITH CONTRAST TECHNIQUE: Multidetector CT imaging of the abdomen and pelvis was performed using the standard protocol following bolus administration of intravenous contrast. CONTRAST:  130mL OMNIPAQUE IOHEXOL 300 MG/ML  SOLN COMPARISON:  Chest CT 11/10/2017. FINDINGS: Lower chest: Small layering pleural effusions are new since 2019, larger on the right with simple fluid density. No pericardial effusion. Cardiac size now at the upper limits of normal. Mild lung base atelectasis. Hepatobiliary: Coarse, heterogeneous liver enhancement is new since 2019. This has an infiltrative appearance, without a discrete liver mass or lesion. Gallbladder remains within normal limits. Pancreas: Small circumscribed simple fluid density cystic mass at the tail of the pancreas is 18 mm on series 5, image 27. Elsewhere the pancreatic parenchyma appears somewhat atrophied. No regional inflammation. Spleen: Negative. Adrenals/Urinary Tract: Normal adrenal glands. Symmetric renal enhancement and contrast excretion. Distended but otherwise unremarkable bladder. Stomach/Bowel: Redundant but decompressed large bowel in the abdomen and pelvis. There is retained stool in the right colon. Normal appendix on coronal image 45. Cecum appears mildly distended with contrast. Terminal ileum is decompressed. No dilated small bowel. Negative stomach and duodenum. No free air. No abdominal free fluid. Vascular/Lymphatic: Aortoiliac calcified atherosclerosis. Major arterial structures in the abdomen and pelvis are patent. Portal venous system is patent. No lymphadenopathy. Reproductive: Small uterine fibroids, otherwise within normal limits. Other: There is trace free fluid in the cul-de-sac on series 5, image 73. Musculoskeletal: Lytic lesion with  posterior expansion, retropulsion of tumor from the T11 vertebral body (sagittal image 58). Mild to moderate associated spinal stenosis there (series 5, image 19). Mild associated T11 loss of height,  compression. Right side T8 vertebral body roughly 11 mm lytic lesion is also new since 2019 on sagittal image 57. Similar lytic lesions in the L4 and S1 bodies. Visible ribs appear intact. There is a 2 cm lytic lesion of the medial left iliac bone with probable early extraosseous extension on series 5, image 60. Elsewhere the pelvis appears intact. IMPRESSION: 1. Fairly widespread osseous metastatic disease, with lytic lesions in the spine and pelvis. Pathologic T11 compression fracture with epidural extension of tumor resulting in mild to moderate spinal stenosis. If there are myelopathic symptoms consider T11 cord compression. 2. Heterogeneous enhancement of the liver is new since 2019. The appearance is infiltrative and in light of #1 hepatic metastatic disease cannot be excluded although no discrete liver mass. Alternatively consider hepatitis or other intrinsic hepatocellular disease. 3. Small layering pleural effusions. Trace free fluid in the pelvis. 4. Small 1.8 cm cystic mass at the tail of the pancreas, with some background pancreatic atrophy. This is indeterminate and meets consensus criteria for re-imaging q6 months x4 to evaluate growth versus stability. 5. Aortic Atherosclerosis (ICD10-I70.0). Electronically Signed   By: Genevie Ann M.D.   On: 05/03/2021 04:59   DG Chest Port 1 View  Result Date: 05/14/2021 CLINICAL DATA:  Pneumothorax after right lung biopsy EXAM: PORTABLE CHEST 1 VIEW COMPARISON:  04/18/2021, 05/14/2021 FINDINGS: Single frontal view of the chest demonstrates an unremarkable cardiac silhouette. Previously identified right upper lobe mass again seen. No new airspace disease. No evidence of pleural effusion. I do not see any pneumothorax after recent right lung biopsy. No acute bony abnormalities. IMPRESSION: 1. No complication after right lung biopsy. 2. Stable right lung mass. Electronically Signed   By: Randa Ngo M.D.   On: 05/14/2021 15:21   DG CHEST PORT 1 VIEW  Result  Date: 05/08/2021 CLINICAL DATA:  Bronchoscopy yesterday, cough EXAM: PORTABLE CHEST 1 VIEW COMPARISON:  Chest radiograph 1 day prior, CT chest 05/03/2021 FINDINGS: The cardiomediastinal silhouette is stable. The approximally 4.6 cm mass in the right upper lobe is unchanged. Opacities surrounding this mass seen on the prior radiograph are decreased in conspicuity. There is no new or worsening focal airspace disease. There is no pleural effusion. There is no pneumothorax. The bones are stable. IMPRESSION: Interval decreased in conspicuity of patchy opacities surrounding the right upper lobe mass compared to the study from 1 day prior. No new or worsening focal airspace disease. No pneumothorax. Electronically Signed   By: Valetta Mole M.D.   On: 05/08/2021 08:45   DG CHEST PORT 1 VIEW  Result Date: 05/07/2021 CLINICAL DATA:  Status post bronchoscopy EXAM: PORTABLE CHEST 1 VIEW COMPARISON:  05/02/2021 FINDINGS: New patchy alveolar densities seen in the right upper lung fields. Previously seen 4 cm nodular density in the right upper lung fields is obscured by the new infiltrate. Findings suggest possible pulmonary hemorrhage related to bronchoscopic biopsy. Rest of the lung fields are unremarkable. There is no pleural effusion or pneumothorax. IMPRESSION: New alveolar infiltrate is seen in the right upper lung fields possibly suggesting pulmonary hemorrhage related to bronchoscopic biopsy. Electronically Signed   By: Elmer Picker M.D.   On: 05/07/2021 12:00   CT LUNG MASS BIOPSY  Result Date: 05/14/2021 CLINICAL DATA:  Posterior right upper lobe lung mass measuring up to approximately 3.8 cm  by CT. Bronchoscopy on 05/07/2021 with sampling was not conclusive for malignancy. EXAM: CT GUIDED CORE BIOPSY OF RIGHT UPPER LOBE LUNG MASS ANESTHESIA/SEDATION: Moderate (conscious) sedation was employed during this procedure. A total of Versed 2.0 mg and Fentanyl 50 mcg was administered intravenously by radiology  nursing. Moderate Sedation Time: 39 minutes. The patient's level of consciousness and vital signs were monitored continuously by radiology nursing throughout the procedure under my direct supervision. PROCEDURE: The procedure risks, benefits, and alternatives were explained to the patient. Questions regarding the procedure were encouraged and answered. The patient understands and consents to the procedure. A time-out was performed prior to initiating the procedure. Initial CT was performed through the chest in a prone position. The right posterior and posterolateral chest wall was prepped with chlorhexidine in a sterile fashion, and a sterile drape was applied covering the operative field. A sterile gown and sterile gloves were used for the procedure. Local anesthesia was provided with 1% Lidocaine. Under CT guidance, past a 17 gauge trocar needle was advanced to the level of a posterior right upper lobe lung mass. After confirming needle tip position, an 18 gauge core biopsy sample was obtained. Additional CT was performed. Aspiration of air was then performed in the posterior right pleural space through the outer needle utilizing a 3 way stopcock and syringe. Additional CT was performed. COMPLICATIONS: Small right posterior pneumothorax treated with needle aspiration. FINDINGS: Initial imaging again demonstrates a solid and lobulated oval-shaped mass in the posterior right upper lobe that abuts the major fissure and posterolateral pleural surface. There was significant difficulty in approaching the mass percutaneously due to overlying ribs and the scapula. Eventually a window was available along the lateral aspect of the mass for biopsy which successfully yielded a single intact core biopsy sample. After the first biopsy sample was obtained, there was development of a small posterior pneumothorax. This was treated with needle aspiration resulting in diminished size of the pneumothorax. Due to pneumothorax  development, further core biopsy samples were not attempted to be attained. IMPRESSION: CT-guided core biopsy of right upper lobe lung mass. Approach to the mass was difficult due to intervening scapula and ribs. After a single core biopsy was obtained, there was development of a small pneumothorax which was treated with needle aspiration. Electronically Signed   By: Aletta Edouard M.D.   On: 05/14/2021 16:36     DG Chest 2 View  Result Date: 05/03/2021 CLINICAL DATA:  Weakness history of breast mass, initial encounter EXAM: CHEST - 2 VIEW COMPARISON:  11/09/2017 FINDINGS: Check shadow is mildly enlarged. The lungs are well aerated bilaterally. There is a 3.9 x 3.0 cm rounded soft tissue mass lesion projecting in the posterior aspect of the right upper lobe along the major fissure. This was not seen on the prior exam and is felt to represent a primary pulmonary neoplasm till proven otherwise. No bony abnormality is noted. IMPRESSION: Right upper lobe mass. CT of the chest with contrast is recommended for further evaluation. Electronically Signed   By: Inez Catalina M.D.   On: 05/03/2021 00:09   CT Head Wo Contrast  Result Date: 05/03/2021 CLINICAL DATA:  76 year old female with headache weakness, decreased P.O. Right side flank pain for 3 months. EXAM: CT HEAD WITHOUT CONTRAST TECHNIQUE: Contiguous axial images were obtained from the base of the skull through the vertex without intravenous contrast. COMPARISON:  Head CT 06/02/2015. FINDINGS: Brain: Cerebral volume is within normal limits for age. No midline shift, ventriculomegaly, mass effect, evidence  of mass lesion, intracranial hemorrhage or evidence of cortically based acute infarction. Gray-white matter differentiation is within normal limits throughout the brain. Vascular: Mild Calcified atherosclerosis at the skull base. No suspicious intracranial vascular hyperdensity. Skull: Stable, negative. Sinuses/Orbits: Visualized paranasal sinuses and  mastoids are clear. Tympanic cavities are clear. Other: Visualized orbits and scalp soft tissues are within normal limits. IMPRESSION: Normal for age noncontrast Head CT. Electronically Signed   By: Genevie Ann M.D.   On: 05/03/2021 04:49   CT CHEST W CONTRAST  Result Date: 05/03/2021 CLINICAL DATA:  Concern for lung nodule.  Abnormal x-ray EXAM: CT CHEST WITH CONTRAST TECHNIQUE: Multidetector CT imaging of the chest was performed during intravenous contrast administration. CONTRAST:  16mL OMNIPAQUE IOHEXOL 300 MG/ML  SOLN COMPARISON:  Chest x-ray earlier the same day FINDINGS: Cardiovascular: Heart is enlarged. No pericardial effusion identified. Main pulmonary artery is normal caliber. Thoracic aorta is normal caliber with mild atherosclerotic plaques. Mediastinum/Nodes: A few enlarged mediastinal lymph nodes measuring up to 11 mm in short axis in the superior mediastinum and 10 mm in short axis precarinal. Enlarged right hilar lymph node measuring 11 mm in short axis. No axillary lymphadenopathy identified. Subcentimeter right thyroid lobe nodule noted. Lungs/Pleura: Lungs are hyperinflated with mild emphysematous changes and mild hazy mosaic attenuation throughout. There is a solid centrally necrotic mass identified in the posterior aspect of the right upper lobe which measures 2.7 x 3.8 x 2.8 cm and partially abuts the major fissure and the posterolateral pleura. The mass demonstrates lobulated slightly irregular borders. 6 mm pleural-based pulmonary nodule in the posterior aspect of the right upper lobe, superior to the mass. A few small adjacent pulmonary nodular densities in the left upper lobe subpleural measuring up to 4 mm in size. Small right and trace left pleural effusions. No pneumothorax. Upper Abdomen: No acute process identified in the visualized upper abdomen. Nodular contour of the liver suggesting cirrhosis. At least 3 cystic density lesions in the pancreas measuring up to 16 mm in the tail the  pancreas. Musculoskeletal: Multiple lytic likely metastatic bone lesions identified in the spine and ribs. Largest lytic lesion is in the T11 vertebral body which is expansile and extends 8 mm dorsal to the posterior vertebral body margin and causes moderate to severe spinal canal stenosis. IMPRESSION: 1. Solid and centrally necrotic lung mass in the right upper lobe as described, consistent with primary malignancy. 2. Emphysematous changes of the lungs. A few additional small pulmonary nodules bilaterally. 3. Small right and trace left pleural effusions. 4. Mediastinal and right hilar mild adenopathy, possibly metastatic. 5. Multiple lytic osseous bone metastases. Most notably a dorsally expansile lesion in the T11 vertebral body which causes moderate to severe spinal canal stenosis. Correlate clinically for need for MRI evaluation. 6. Chronic findings in the abdomen including hepatic cirrhosis and at least 3 hypodense cystic lesions in the pancreas which may represent cysts, pseudocysts, or side branch IPMN. Aortic Atherosclerosis (ICD10-I70.0) and Emphysema (ICD10-J43.9). Electronically Signed   By: Ofilia Neas M.D.   On: 05/03/2021 10:34   MR THORACIC SPINE W WO CONTRAST  Result Date: 05/15/2021 CLINICAL DATA:  T11 metastatic disease.  Right upper lobe lung mass. EXAM: MRI THORACIC WITHOUT AND WITH CONTRAST TECHNIQUE: Multiplanar and multiecho pulse sequences of the thoracic spine were obtained without and with intravenous contrast. CONTRAST:  6.57mL GADAVIST GADOBUTROL 1 MMOL/ML IV SOLN COMPARISON:  CT chest dated May 03, 2021. FINDINGS: Alignment:  Physiologic. Vertebrae: Multiple thoracic vertebral body metastatic lesions. Pathologic  fracture of the T11 vertebral body with up to 35% height loss. Thin anterior epidural tumor extension with associated enhancement (series 10, image 12). No other sites of epidural extension. Cord:  Normal signal and morphology.  No intrathecal enhancement. Paraspinal  and other soft tissues: Large right upper lobe mass. 2.1 cm cystic lesion in the pancreatic tail. Disc levels: No significant disc bulge or herniation. Bulging of the T11 posterior vertebral body and overlying anterior epidural tumor extension results in moderate spinal canal stenosis with slight cord flattening. No neuroforaminal stenosis at any level. IMPRESSION: 1. Multiple thoracic vertebral body metastases again noted. Pathologic fracture of the T11 vertebral body with up to 35% height loss. Bulging of the posterior vertebral body margin with overlying thin anterior epidural tumor extension results in moderate spinal canal stenosis at this level. No cord signal abnormality. 2. Right upper lobe lung cancer as seen on prior studies. 3. 2.1 cm cystic lesion in the pancreatic tail. Please see prior CT abdomen and pelvis report from 05/03/2021 for follow-up recommendations. Electronically Signed   By: Titus Dubin M.D.   On: 05/15/2021 17:29   CT ABDOMEN PELVIS W CONTRAST  Result Date: 05/03/2021 CLINICAL DATA:  76 year old female with headache weakness, decreased P.O. Right side flank pain for 3 months. 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 EXAM: CT ABDOMEN AND PELVIS WITH CONTRAST TECHNIQUE: Multidetector CT imaging of the abdomen and pelvis was performed using the standard protocol following bolus administration of intravenous contrast. CONTRAST:  125mL OMNIPAQUE IOHEXOL 300 MG/ML  SOLN COMPARISON:  Chest CT 11/10/2017. FINDINGS: Lower chest: Small layering pleural effusions are new since 2019, larger on the right with simple fluid density. No pericardial effusion. Cardiac size now at the upper limits of normal. Mild lung base atelectasis. Hepatobiliary: Coarse, heterogeneous liver enhancement is new since 2019. This has an infiltrative appearance, without a discrete liver mass or lesion. Gallbladder remains within normal limits. Pancreas: Small  circumscribed simple fluid density cystic mass at the tail of the pancreas is 18 mm on series 5, image 27. Elsewhere the pancreatic parenchyma appears somewhat atrophied. No regional inflammation. Spleen: Negative. Adrenals/Urinary Tract: Normal adrenal glands. Symmetric renal enhancement and contrast excretion. Distended but otherwise unremarkable bladder. Stomach/Bowel: Redundant but decompressed large bowel in the abdomen and pelvis. There is retained stool in the right colon. Normal appendix on coronal image 45. Cecum appears mildly distended with contrast. Terminal ileum is decompressed. No dilated small bowel. Negative stomach and duodenum. No free air. No abdominal free fluid. Vascular/Lymphatic: Aortoiliac calcified atherosclerosis. Major arterial structures in the abdomen and pelvis are patent. Portal venous system is patent. No lymphadenopathy. Reproductive: Small uterine fibroids, otherwise within normal limits. Other: There is trace free fluid in the cul-de-sac on series 5, image 73. Musculoskeletal: Lytic lesion with posterior expansion, retropulsion of tumor from the T11 vertebral body (sagittal image 58). Mild to moderate associated spinal stenosis there (series 5, image 19). Mild associated T11 loss of height, compression. Right side T8 vertebral body roughly 11 mm lytic lesion is also new since 2019 on sagittal image 57. Similar lytic lesions in the L4 and S1 bodies. Visible ribs appear intact. There is a 2 cm lytic lesion of the medial left iliac bone with probable early extraosseous extension on series 5, image 60. Elsewhere the pelvis appears intact. IMPRESSION: 1. Fairly widespread osseous metastatic disease, with lytic lesions in the spine and pelvis. Pathologic T11 compression fracture with epidural extension of tumor resulting  in mild to moderate spinal stenosis. If there are myelopathic symptoms consider T11 cord compression. 2. Heterogeneous enhancement of the liver is new since 2019. The  appearance is infiltrative and in light of #1 hepatic metastatic disease cannot be excluded although no discrete liver mass. Alternatively consider hepatitis or other intrinsic hepatocellular disease. 3. Small layering pleural effusions. Trace free fluid in the pelvis. 4. Small 1.8 cm cystic mass at the tail of the pancreas, with some background pancreatic atrophy. This is indeterminate and meets consensus criteria for re-imaging q6 months x4 to evaluate growth versus stability. 5. Aortic Atherosclerosis (ICD10-I70.0). Electronically Signed   By: Genevie Ann M.D.   On: 05/03/2021 04:59   DG Chest Port 1 View  Result Date: 05/14/2021 CLINICAL DATA:  Pneumothorax after right lung biopsy EXAM: PORTABLE CHEST 1 VIEW COMPARISON:  04/18/2021, 05/14/2021 FINDINGS: Single frontal view of the chest demonstrates an unremarkable cardiac silhouette. Previously identified right upper lobe mass again seen. No new airspace disease. No evidence of pleural effusion. I do not see any pneumothorax after recent right lung biopsy. No acute bony abnormalities. IMPRESSION: 1. No complication after right lung biopsy. 2. Stable right lung mass. Electronically Signed   By: Randa Ngo M.D.   On: 05/14/2021 15:21   DG CHEST PORT 1 VIEW  Result Date: 05/08/2021 CLINICAL DATA:  Bronchoscopy yesterday, cough EXAM: PORTABLE CHEST 1 VIEW COMPARISON:  Chest radiograph 1 day prior, CT chest 05/03/2021 FINDINGS: The cardiomediastinal silhouette is stable. The approximally 4.6 cm mass in the right upper lobe is unchanged. Opacities surrounding this mass seen on the prior radiograph are decreased in conspicuity. There is no new or worsening focal airspace disease. There is no pleural effusion. There is no pneumothorax. The bones are stable. IMPRESSION: Interval decreased in conspicuity of patchy opacities surrounding the right upper lobe mass compared to the study from 1 day prior. No new or worsening focal airspace disease. No pneumothorax.  Electronically Signed   By: Valetta Mole M.D.   On: 05/08/2021 08:45   DG CHEST PORT 1 VIEW  Result Date: 05/07/2021 CLINICAL DATA:  Status post bronchoscopy EXAM: PORTABLE CHEST 1 VIEW COMPARISON:  05/02/2021 FINDINGS: New patchy alveolar densities seen in the right upper lung fields. Previously seen 4 cm nodular density in the right upper lung fields is obscured by the new infiltrate. Findings suggest possible pulmonary hemorrhage related to bronchoscopic biopsy. Rest of the lung fields are unremarkable. There is no pleural effusion or pneumothorax. IMPRESSION: New alveolar infiltrate is seen in the right upper lung fields possibly suggesting pulmonary hemorrhage related to bronchoscopic biopsy. Electronically Signed   By: Elmer Picker M.D.   On: 05/07/2021 12:00   CT LUNG MASS BIOPSY  Result Date: 05/14/2021 CLINICAL DATA:  Posterior right upper lobe lung mass measuring up to approximately 3.8 cm by CT. Bronchoscopy on 05/07/2021 with sampling was not conclusive for malignancy. EXAM: CT GUIDED CORE BIOPSY OF RIGHT UPPER LOBE LUNG MASS ANESTHESIA/SEDATION: Moderate (conscious) sedation was employed during this procedure. A total of Versed 2.0 mg and Fentanyl 50 mcg was administered intravenously by radiology nursing. Moderate Sedation Time: 39 minutes. The patient's level of consciousness and vital signs were monitored continuously by radiology nursing throughout the procedure under my direct supervision. PROCEDURE: The procedure risks, benefits, and alternatives were explained to the patient. Questions regarding the procedure were encouraged and answered. The patient understands and consents to the procedure. A time-out was performed prior to initiating the procedure. Initial CT was performed through  the chest in a prone position. The right posterior and posterolateral chest wall was prepped with chlorhexidine in a sterile fashion, and a sterile drape was applied covering the operative field. A  sterile gown and sterile gloves were used for the procedure. Local anesthesia was provided with 1% Lidocaine. Under CT guidance, past a 17 gauge trocar needle was advanced to the level of a posterior right upper lobe lung mass. After confirming needle tip position, an 18 gauge core biopsy sample was obtained. Additional CT was performed. Aspiration of air was then performed in the posterior right pleural space through the outer needle utilizing a 3 way stopcock and syringe. Additional CT was performed. COMPLICATIONS: Small right posterior pneumothorax treated with needle aspiration. FINDINGS: Initial imaging again demonstrates a solid and lobulated oval-shaped mass in the posterior right upper lobe that abuts the major fissure and posterolateral pleural surface. There was significant difficulty in approaching the mass percutaneously due to overlying ribs and the scapula. Eventually a window was available along the lateral aspect of the mass for biopsy which successfully yielded a single intact core biopsy sample. After the first biopsy sample was obtained, there was development of a small posterior pneumothorax. This was treated with needle aspiration resulting in diminished size of the pneumothorax. Due to pneumothorax development, further core biopsy samples were not attempted to be attained. IMPRESSION: CT-guided core biopsy of right upper lobe lung mass. Approach to the mass was difficult due to intervening scapula and ribs. After a single core biopsy was obtained, there was development of a small pneumothorax which was treated with needle aspiration. Electronically Signed   By: Aletta Edouard M.D.   On: 05/14/2021 16:36    Pathology: Pathology from colonoscopy on 05/09/2021 and CT-guided lung biopsy from 05/14/2021 currently pending  Assessment and Plan:  1.  Lung mass with osseous lytic bone lesions 2.  Pathologic T11 compression fracture with impending cord compression 3.  Anemia secondary to iron  deficiency and chronic disease 4.  Gastric ulcer 5.  Cirrhosis of the liver 6.  Emphysema 7.  Cystic mass of the pancreas 8.  Hyponatremia 9.  Acute on chronic diastolic CHF  -Results today have been discussed with the patient.  She appears to have metastatic malignancy - ? Lung primary vs. Other.  Biopsy from the colonoscopy and CT-guided lung biopsy are still pending. -The patient request that her pathology be reviewed at [facility] just like the biopsy from 2019 was.  This request has already been made by radiation oncology. -Recommend for her to proceed with a course of palliative radiation to T11 under the care of Dr. Lisbeth Renshaw. -We will await pathology results and have further discussion with her as an outpatient regarding treatment options.  However, she has indicated that she would not be interested in chemotherapy but will readdress this with her as an outpatient. -We also recommend PET scan as an outpatient. -Agree with administering IV iron when patient is agreeable to this. -GI planning for MRI/MRCP to follow the cystic mass in the pancreas.  Of note, CA 19.9 was normal.  Thank you for this referral.   Mikey Bussing, DNP, AGPCNP-BC, AOCNP   Addendum  I have seen the patient, examined her. I agree with the assessment and and plan and have edited the notes.   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  She underwent CT-guided lung mass biopsy on January started 2023, pathology still pending.  Her recent EGD and colonoscopy was negative for malignancy.  Is concerning for metastatic lung cancer, versus metastatic recurrence of her previous breast cancer.  I agree with palliative RT to her T11  bone lesion, which she will start probably next week. Pt has requested her biopsy sample to be sent to [facility] in [place] for a second opinion, which may take a few weeks.  I briefly reviewed the incurable nature of her metastatic cancer, and the need for systemic treatment for disease control and prolong her life.  Depending on the tissue diagnosis, already of her malignancy, will tailor her treatment plan.  I will likely request next January sequencing, such as [facility], on her biopsy sample, if we have enough tissue. All patients were answered.  I spoke with her son on the phone. I will see her back in office in about 2 weeks, or sooner if needed.   Truitt Merle  05/17/2021

## 2021-05-17 ENCOUNTER — Ambulatory Visit
Admission: RE | Admit: 2021-05-17 | Discharge: 2021-05-17 | Disposition: A | Discharge status: Home / Self Care | Payer: Medicare HMO | Source: Ambulatory Visit | Attending: Radiation Oncology | Admitting: Radiation Oncology

## 2021-05-17 DIAGNOSIS — R531 Weakness: Secondary | ICD-10-CM

## 2021-05-17 DIAGNOSIS — M8448XA Pathological fracture, other site, initial encounter for fracture: Secondary | ICD-10-CM

## 2021-05-17 DIAGNOSIS — R590 Localized enlarged lymph nodes: Secondary | ICD-10-CM

## 2021-05-17 DIAGNOSIS — D638 Anemia in other chronic diseases classified elsewhere: Secondary | ICD-10-CM

## 2021-05-17 DIAGNOSIS — I5089 Other heart failure: Secondary | ICD-10-CM

## 2021-05-17 DIAGNOSIS — E871 Hypo-osmolality and hyponatremia: Secondary | ICD-10-CM

## 2021-05-17 DIAGNOSIS — M899 Disorder of bone, unspecified: Secondary | ICD-10-CM

## 2021-05-17 DIAGNOSIS — Z510 Encounter for antineoplastic radiation therapy: Secondary | ICD-10-CM | POA: Insufficient documentation

## 2021-05-17 DIAGNOSIS — C7951 Secondary malignant neoplasm of bone: Secondary | ICD-10-CM | POA: Insufficient documentation

## 2021-05-17 DIAGNOSIS — C787 Secondary malignant neoplasm of liver and intrahepatic bile duct: Secondary | ICD-10-CM

## 2021-05-17 DIAGNOSIS — R918 Other nonspecific abnormal finding of lung field: Secondary | ICD-10-CM | POA: Diagnosis not present

## 2021-05-17 LAB — CBC
HCT: 27.4 % — ABNORMAL LOW (ref 36.0–46.0)
Hemoglobin: 8.4 g/dL — ABNORMAL LOW (ref 12.0–15.0)
MCH: 23 pg — ABNORMAL LOW (ref 26.0–34.0)
MCHC: 30.7 g/dL (ref 30.0–36.0)
MCV: 74.9 fL — ABNORMAL LOW (ref 80.0–100.0)
Platelets: 526 10*3/uL — ABNORMAL HIGH (ref 150–400)
RBC: 3.66 MIL/uL — ABNORMAL LOW (ref 3.87–5.11)
RDW: 19.3 % — ABNORMAL HIGH (ref 11.5–15.5)
WBC: 12.9 10*3/uL — ABNORMAL HIGH (ref 4.0–10.5)
nRBC: 0 % (ref 0.0–0.2)

## 2021-05-17 LAB — BASIC METABOLIC PANEL
Anion gap: 11 (ref 5–15)
BUN: 8 mg/dL (ref 8–23)
CO2: 27 mmol/L (ref 22–32)
Calcium: 8.3 mg/dL — ABNORMAL LOW (ref 8.9–10.3)
Chloride: 95 mmol/L — ABNORMAL LOW (ref 98–111)
Creatinine, Ser: 0.73 mg/dL (ref 0.44–1.00)
GFR, Estimated: >60 mL/min (ref >60)
Glucose, Bld: 128 mg/dL — ABNORMAL HIGH (ref 70–99)
Potassium: 3.7 mmol/L (ref 3.5–5.1)
Sodium: 133 mmol/L — ABNORMAL LOW (ref 135–145)

## 2021-05-17 LAB — TYPE AND SCREEN
ABO/RH(D): B POS
Antibody Screen: NEGATIVE
Unit division: 0

## 2021-05-17 LAB — BPAM RBC
Blood Product Expiration Date: 202301172359
ISSUE DATE / TIME: 202301051352
Unit Type and Rh: 7300

## 2021-05-17 NOTE — Progress Notes (Signed)
Patient refused bed alarm, falls education done.

## 2021-05-17 NOTE — Progress Notes (Signed)
PROGRESS NOTE    Robin Estrada  ZOX:[id number] DOB: Jul 10, 1945 DOA: 05/02/2021 PCP: [name], MD   Brief Narrative:  76 year old woman, Russian-speaking, PMH breast cancer, admitted 12/22 with history of anorexia and weight loss, found to have anemia, cystic lesion of the pancreas, infiltrative disease in the liver and right upper lobe mass.  Seen by GI, underwent EGD showing gastritis and ulcer, seen by palliative medicine with goals to remain full code, no chemotherapy, but would consider radiation.  Underwent bronchoscopy 12/27, multiple areas biopsied but pathology unrevealing.  Underwent colonoscopy 12/29.  Several polyps removed.  Status post CT-guided biopsy by interventional radiology 1/3.  Plan for MRI of the back to further assess T11 pathologic fracture to rule out epidural extension.  Repeat lab work in the morning, likely home in the next 48 hours, doing well with PT.  Assessment & Plan:   Principal Problem:   Lung mass Active Problems:   Essential hypertension   History of breast cancer   GI bleed   Chronic blood loss anemia   Leukocytosis   Hypokalemia   Thrombocytosis   Hypoalbuminemia   Pathologic compression fracture of thoracic vertebra (HCC)   Cystic mass of pancreas   Acute on chronic diastolic CHF (congestive heart failure) (HCC)   Iron deficiency anemia   Acute gastric ulcer without hemorrhage or perforation   Wheezing   Benign neoplasm of cecum   Benign neoplasm of sigmoid colon   Therapeutic opioid induced constipation   Osseous metastasis (HCC)   Other cirrhosis of liver (HCC)   Aortic atherosclerosis (HCC)   Emphysema of lung (HCC)  Lung mass/osseous metastasis/pathologic T11 compression fracture- (present on admission): per CT: Multiple lytic osseous bone metastases. Most notably a dorsally expansile lesion in the T11 vertebral body which causes moderate to severe spinal canal stenosis.  Pain fairly well controlled on Duragesic and  Lortab.  No symptoms of myelopathy or cord compression. MRI thoracic spine shows 35% height loss on T11.  Patient seen by neurosurgery/Dr. Ellene Route who recommended radiation to the area and no other intervention.  Radiation oncology consulted, patient had simulation done today. -- Concerning for primary lung cancer, also widely metastatic osseous disease noted.  Bronchoscopy specimens were unrevealing.  Status post CT-guided biopsy 1/3.  Pathology pending.  Hypertension: Fairly controlled.  Continue atenolol.  Acute on chronic diastolic CHF: Does not appear volume overloaded.  Likely euvolemic.  Lasix stopped.  Resume outpatient diuretic on discharge.  Hypokalemia: Resolved   Emphysema of lung (HCC) --asymptomatic   Other cirrhosis of liver (HCC) --seen on CT, appears stable, consider outpatient follow-up   Acute gastric ulcer without hemorrhage or perforation/chronic blood loss anemia/iron deficiency anemia- (present on admission) --EGD 12/24: 4 cm HH.  Clean-based, non-bleeding GU, erosive gastritis. Biopsy showed no significant abnormality of small bowel, negative H pylori, no inflammation/dysplasia or malignancy.  Continue PPI.  GI on board. No aspirin, ibuprofen, naproxen, or other non-steroidal anti-inflammatory drugs for 2 weeks after polyp removal.  She received IV iron here.  Will start on oral iron at discharge.  Hemoglobin dropped to 7.0 and she received 1 unit of PRBC transfusion on 05/16/2021.  She wants her IV iron to be delayed until next week.    Cystic mass of pancreas- (present on admission) -- CA 19-9 level normal.  On board and they plan to perform MRI/MRCP to evaluate pancreas at some point in time but not urgent.    DVT prophylaxis: SCDs Start: 05/03/21 8119   Code Status:  Full Code  Family Communication:  None present at bedside.  Plan of care discussed with patient in length and he verbalized understanding and agreed with it.   Status is: Inpatient  Remains inpatient  appropriate because: Awaiting pathology results  Estimated body mass index is 24.62 kg/m as calculated from the following:   Height as of this encounter: 5\' 5"  (1.651 m).   Weight as of this encounter: 67.1 kg.  Nutritional Assessment: Body mass index is 24.62 kg/m.Marland Kitchen Seen by dietician.  I agree with the assessment and plan as outlined below: Nutrition Status: Nutrition Problem: Inadequate oral intake Etiology: poor appetite Signs/Symptoms: per patient/family report Interventions: Ensure Enlive (each supplement provides 350kcal and 20 grams of protein), MVI  Skin Assessment: I have examined the patient's skin and I agree with the wound assessment as performed by the wound care RN as outlined below:    Consultants:  GI Neurosurgery  Procedures:  None  Antimicrobials:  Anti-infectives (From admission, onward)    None          Subjective: Remote video interpreter used for communication.  Seen and examined.  She has no new complaint.  Objective: Vitals:   05/16/21 1421 05/16/21 1730 05/17/21 0627 05/17/21 0721  BP: (!) 144/52 (!) 165/62 (!) 149/46 (!) 152/43  Pulse: 71 80 72 71  Resp: 16 16 18 16   Temp: 97.7 F (36.5 C) 98.1 F (36.7 C) 99.1 F (37.3 C) 97.7 F (36.5 C)  TempSrc: Oral Oral Oral Oral  SpO2: 98% 98% 94% 96%  Weight:      Height:        Intake/Output Summary (Last 24 hours) at 05/17/2021 1335 Last data filed at 05/17/2021 0500 Gross per 24 hour  Intake 652.67 ml  Output --  Net 652.67 ml    Filed Weights   05/04/21 0945 05/07/21 0815 05/09/21 1117  Weight: 67.1 kg 67.1 kg 67.1 kg    Examination:  General exam: Appears calm and comfortable  Respiratory system: Clear to auscultation. Respiratory effort normal. Cardiovascular system: S1 & S2 heard, RRR. No JVD, murmurs, rubs, gallops or clicks. No pedal edema. Gastrointestinal system: Abdomen is nondistended, soft and nontender. No organomegaly or masses felt. Normal bowel sounds  heard. Central nervous system: Alert and oriented. No focal neurological deficits. Extremities: Symmetric 5 x 5 power. Skin: No rashes, lesions or ulcers.  Psychiatry: Judgement and insight appear normal. Mood & affect appropriate.   Data Reviewed: I have personally reviewed following labs and imaging studies  CBC: Recent Labs  Lab 05/12/21 0643 05/15/21 0605 05/16/21 0640 05/16/21 1937 05/17/21 0515  WBC 12.8* 11.9* 11.2*  --  12.9*  HGB 7.1* 7.1* 7.0* 9.2* 8.4*  HCT 23.5* 24.7* 23.4* 29.2* 27.4*  MCV 74.6* 73.7* 73.1*  --  74.9*  PLT 459* 504* 510*  --  526*    Basic Metabolic Panel: Recent Labs  Lab 05/12/21 0643 05/14/21 0655 05/15/21 0605 05/16/21 0640 05/17/21 0515  NA 133* 131* 133* 132* 133*  K 3.8 3.5 3.2* 3.6 3.7  CL 99 93* 95* 96* 95*  CO2 26 28 28 26 27   GLUCOSE 144* 124* 136* 120* 128*  BUN 9 10 11 9 8   CREATININE 0.71 0.80 0.79 0.75 0.73  CALCIUM 8.2* 8.2* 8.5* 8.2* 8.3*    GFR: Estimated Creatinine Clearance: 53.8 mL/min (by C-G formula based on SCr of 0.73 mg/dL). Liver Function Tests: Recent Labs  Lab 05/12/21 0643  AST 13*  ALT 12  ALKPHOS 85  BILITOT  0.6  PROT 5.8*  ALBUMIN 2.4*    No results for input(s): LIPASE, AMYLASE in the last 168 hours. No results for input(s): AMMONIA in the last 168 hours. Coagulation Profile: No results for input(s): INR, PROTIME in the last 168 hours. Cardiac Enzymes: No results for input(s): CKTOTAL, CKMB, CKMBINDEX, TROPONINI in the last 168 hours. BNP (last 3 results) No results for input(s): PROBNP in the last 8760 hours. HbA1C: No results for input(s): HGBA1C in the last 72 hours. CBG: No results for input(s): GLUCAP in the last 168 hours. Lipid Profile: No results for input(s): CHOL, HDL, LDLCALC, TRIG, CHOLHDL, LDLDIRECT in the last 72 hours. Thyroid Function Tests: No results for input(s): TSH, T4TOTAL, FREET4, T3FREE, THYROIDAB in the last 72 hours. Anemia Panel: No results for input(s):  VITAMINB12, FOLATE, FERRITIN, TIBC, IRON, RETICCTPCT in the last 72 hours. Sepsis Labs: No results for input(s): PROCALCITON, LATICACIDVEN in the last 168 hours.  No results found for this or any previous visit (from the past 240 hour(s)).    Radiology Studies: MR THORACIC SPINE W WO CONTRAST  Result Date: 05/15/2021 CLINICAL DATA:  T11 metastatic disease.  Right upper lobe lung mass. EXAM: MRI THORACIC WITHOUT AND WITH CONTRAST TECHNIQUE: Multiplanar and multiecho pulse sequences of the thoracic spine were obtained without and with intravenous contrast. CONTRAST:  6.28mL GADAVIST GADOBUTROL 1 MMOL/ML IV SOLN COMPARISON:  CT chest dated May 03, 2021. FINDINGS: Alignment:  Physiologic. Vertebrae: Multiple thoracic vertebral body metastatic lesions. Pathologic fracture of the T11 vertebral body with up to 35% height loss. Thin anterior epidural tumor extension with associated enhancement (series 10, image 12). No other sites of epidural extension. Cord:  Normal signal and morphology.  No intrathecal enhancement. Paraspinal and other soft tissues: Large right upper lobe mass. 2.1 cm cystic lesion in the pancreatic tail. Disc levels: No significant disc bulge or herniation. Bulging of the T11 posterior vertebral body and overlying anterior epidural tumor extension results in moderate spinal canal stenosis with slight cord flattening. No neuroforaminal stenosis at any level. IMPRESSION: 1. Multiple thoracic vertebral body metastases again noted. Pathologic fracture of the T11 vertebral body with up to 35% height loss. Bulging of the posterior vertebral body margin with overlying thin anterior epidural tumor extension results in moderate spinal canal stenosis at this level. No cord signal abnormality. 2. Right upper lobe lung cancer as seen on prior studies. 3. 2.1 cm cystic lesion in the pancreatic tail. Please see prior CT abdomen and pelvis report from 05/03/2021 for follow-up recommendations.  Electronically Signed   By: Titus Dubin M.D.   On: 05/15/2021 17:29    Scheduled Meds:  atenolol  100 mg Oral BID   feeding supplement  237 mL Oral TID BM   fentaNYL  1 patch Transdermal Q72H   lidocaine  1 patch Transdermal Q24H   multivitamin with minerals  1 tablet Oral Daily   pantoprazole  40 mg Oral BID AC   polyethylene glycol  17 g Oral Daily   sodium chloride flush  3 mL Intravenous Q12H   Continuous Infusions:   LOS: 14 days   Time spent: 25 minutes   Darliss Cheney, MD [facility]  05/17/2021, 1:35 PM  Please page via [place] and do not message via secure chat for anything urgent. Secure chat can be used for anything non urgent.  How to contact the [facility] Attending or Consulting provider [place] or covering provider during after hours [place], for this patient?  Check the  care team in [facility] and look for a) attending/consulting [place] provider listed and b) the [facility] team listed. Page or secure chat [phone]. Log into [email or web address] and use [place]'s universal password to access. If you do not have the password, please contact the hospital operator. Locate the [facility] provider you are looking for under [facility] and page to a number that you can be directly reached. If you still have difficulty reaching the provider, please page the [facility] (Director on Call) for the Hospitalists listed on amion for assistance.

## 2021-05-17 NOTE — Progress Notes (Signed)
[facility] at bedside to transport patient to [facility] for radiation.

## 2021-05-17 NOTE — Progress Notes (Addendum)
°   05/17/21 1400  Acute Rehab PT Goals  PT Goal Formulation With patient  Time For Goal Achievement 05/31/21  Potential to Achieve Goals Fair   Pt refused treatment today and goals were due.  Met 0/2 goals. Pt was at radiation all am as well.  Goals revised.  Will continue therapy next week.  Yaslin Kirtley M,PT Acute Rehab Services [phone] (pager)

## 2021-05-18 DIAGNOSIS — R918 Other nonspecific abnormal finding of lung field: Secondary | ICD-10-CM | POA: Diagnosis not present

## 2021-05-18 LAB — CBC WITH DIFFERENTIAL/PLATELET
Abs Immature Granulocytes: 0.05 10*3/uL (ref 0.00–0.07)
Basophils Absolute: 0.1 10*3/uL (ref 0.0–0.1)
Basophils Relative: 0 %
Eosinophils Absolute: 0.3 10*3/uL (ref 0.0–0.5)
Eosinophils Relative: 3 %
HCT: 26.6 % — ABNORMAL LOW (ref 36.0–46.0)
Hemoglobin: 8.4 g/dL — ABNORMAL LOW (ref 12.0–15.0)
Immature Granulocytes: 0 %
Lymphocytes Relative: 9 %
Lymphs Abs: 1.1 10*3/uL (ref 0.7–4.0)
MCH: 23.3 pg — ABNORMAL LOW (ref 26.0–34.0)
MCHC: 31.6 g/dL (ref 30.0–36.0)
MCV: 73.9 fL — ABNORMAL LOW (ref 80.0–100.0)
Monocytes Absolute: 0.9 10*3/uL (ref 0.1–1.0)
Monocytes Relative: 7 %
Neutro Abs: 9.4 10*3/uL — ABNORMAL HIGH (ref 1.7–7.7)
Neutrophils Relative %: 81 %
Platelets: 546 10*3/uL — ABNORMAL HIGH (ref 150–400)
RBC: 3.6 MIL/uL — ABNORMAL LOW (ref 3.87–5.11)
RDW: 19.7 % — ABNORMAL HIGH (ref 11.5–15.5)
WBC: 11.8 10*3/uL — ABNORMAL HIGH (ref 4.0–10.5)
nRBC: 0 % (ref 0.0–0.2)

## 2021-05-18 NOTE — Progress Notes (Signed)
PROGRESS NOTE    Robin Estrada  ZTI:[id number] DOB: [birth year] DOA: 05/02/2021 PCP: [name], MD   Brief Narrative:  76 year old woman, Russian-speaking, PMH breast cancer, admitted 12/22 with history of anorexia and weight loss, found to have anemia, cystic lesion of the pancreas, infiltrative disease in the liver and right upper lobe mass.  Seen by GI, underwent EGD showing gastritis and ulcer, seen by palliative medicine with goals to remain full code, no chemotherapy, but would consider radiation.  Underwent bronchoscopy 12/27, multiple areas biopsied but pathology unrevealing.  Underwent colonoscopy 12/29.  Several polyps removed.  Status post CT-guided biopsy by interventional radiology 1/3.  Plan for MRI of the back to further assess T11 pathologic fracture to rule out epidural extension.  Repeat lab work in the morning, likely home in the next 48 hours, doing well with PT.  Assessment & Plan:   Principal Problem:   Lung mass Active Problems:   Essential hypertension   History of breast cancer   GI bleed   Chronic blood loss anemia   Leukocytosis   Hypokalemia   Thrombocytosis   Hypoalbuminemia   Pathologic compression fracture of thoracic vertebra (HCC)   Cystic mass of pancreas   Acute on chronic diastolic CHF (congestive heart failure) (HCC)   Iron deficiency anemia   Acute gastric ulcer without hemorrhage or perforation   Wheezing   Benign neoplasm of cecum   Benign neoplasm of sigmoid colon   Therapeutic opioid induced constipation   Osseous metastasis (HCC)   Other cirrhosis of liver (HCC)   Aortic atherosclerosis (HCC)   Emphysema of lung (HCC)  Lung mass/osseous metastasis/pathologic T11 compression fracture- (present on admission): per CT: Multiple lytic osseous bone metastases. Most notably a dorsally expansile lesion in the T11 vertebral body which causes moderate to severe spinal canal stenosis.  Pain fairly well controlled on Duragesic and  Lortab.  No symptoms of myelopathy or cord compression. MRI thoracic spine shows 35% height loss on T11.  Patient seen by neurosurgery/Dr. Ellene Route who recommended radiation to the area and no other intervention.  Radiation oncology consulted, patient had simulation done Friday.  Radiation oncology is waiting for pathology results before initiating radiation next week.  Patient would not accept chemotherapy.  Oncology also saw patient. -- Concerning for primary lung cancer, also widely metastatic osseous disease noted.  Bronchoscopy specimens were unrevealing.  Status post CT-guided biopsy 1/3.  Pathology pending.  Hypertension: Fairly controlled.  Continue atenolol.  Acute on chronic diastolic CHF: Does not appear volume overloaded.  Likely euvolemic.  Lasix stopped.  Resume outpatient diuretic on discharge.  Hypokalemia: Resolved   Emphysema of lung (HCC) --asymptomatic   Other cirrhosis of liver (HCC) --seen on CT, appears stable, consider outpatient follow-up   Acute gastric ulcer without hemorrhage or perforation/chronic blood loss anemia/iron deficiency anemia- (present on admission) --EGD 12/24: 4 cm HH.  Clean-based, non-bleeding GU, erosive gastritis. Biopsy showed no significant abnormality of small bowel, negative H pylori, no inflammation/dysplasia or malignancy.  Continue PPI.  GI on board. No aspirin, ibuprofen, naproxen, or other non-steroidal anti-inflammatory drugs for 2 weeks after polyp removal.  She received IV iron here.  Will start on oral iron at discharge.  Hemoglobin dropped to 7.0 and she received 1 unit of PRBC transfusion on 05/16/2021.  She wants her IV iron to be delayed until next week.    Cystic mass of pancreas- (present on admission) -- CA 19-9 level normal.  On board and they plan to perform MRI/MRCP  to evaluate pancreas at some point in time but not urgent.    DVT prophylaxis: SCDs Start: 05/03/21 2440   Code Status: Full Code  Family Communication:  None  present at bedside.  Plan of care discussed with patient in length and he verbalized understanding and agreed with it.   Status is: Inpatient  Remains inpatient appropriate because: Awaiting pathology results  Estimated body mass index is 24.62 kg/m as calculated from the following:   Height as of this encounter: 5\' 5"  (1.651 m).   Weight as of this encounter: 67.1 kg.  Nutritional Assessment: Body mass index is 24.62 kg/m.Marland Kitchen Seen by dietician.  I agree with the assessment and plan as outlined below: Nutrition Status: Nutrition Problem: Inadequate oral intake Etiology: poor appetite Signs/Symptoms: per patient/family report Interventions: Ensure Enlive (each supplement provides 350kcal and 20 grams of protein), MVI  Skin Assessment: I have examined the patient's skin and I agree with the wound assessment as performed by the wound care RN as outlined below:    Consultants:  GI Neurosurgery  Procedures:  None  Antimicrobials:  Anti-infectives (From admission, onward)    None          Subjective: Remote video interpreter used for communication. Seen and examined.  She has no complaints other than back pain.  Objective: Vitals:   05/17/21 1610 05/17/21 1945 05/18/21 0441 05/18/21 0843  BP: (!) 149/62 (!) 161/60 136/60 (!) 156/56  Pulse: 70 77 70 69  Resp: 16 16 16 16   Temp: 97.7 F (36.5 C) 99.9 F (37.7 C) 98.2 F (36.8 C) 98.4 F (36.9 C)  TempSrc: Oral Oral Oral Oral  SpO2: 97% 99% 94% 95%  Weight:      Height:        Intake/Output Summary (Last 24 hours) at 05/18/2021 1333 Last data filed at 05/18/2021 0600 Gross per 24 hour  Intake 240 ml  Output --  Net 240 ml    Filed Weights   05/04/21 0945 05/07/21 0815 05/09/21 1117  Weight: 67.1 kg 67.1 kg 67.1 kg    Examination:  General exam: Appears calm and comfortable  Respiratory system: Clear to auscultation. Respiratory effort normal. Cardiovascular system: S1 & S2 heard, RRR. No JVD, murmurs,  rubs, gallops or clicks. No pedal edema. Gastrointestinal system: Abdomen is nondistended, soft and nontender. No organomegaly or masses felt. Normal bowel sounds heard. Central nervous system: Alert and oriented. No focal neurological deficits. Extremities: Symmetric 5 x 5 power. Skin: No rashes, lesions or ulcers.  Psychiatry: Judgement and insight appear normal. Mood & affect appropriate.    Data Reviewed: I have personally reviewed following labs and imaging studies  CBC: Recent Labs  Lab 05/12/21 0643 05/15/21 0605 05/16/21 0640 05/16/21 1937 05/17/21 0515 05/18/21 0116  WBC 12.8* 11.9* 11.2*  --  12.9* 11.8*  NEUTROABS  --   --   --   --   --  9.4*  HGB 7.1* 7.1* 7.0* 9.2* 8.4* 8.4*  HCT 23.5* 24.7* 23.4* 29.2* 27.4* 26.6*  MCV 74.6* 73.7* 73.1*  --  74.9* 73.9*  PLT 459* 504* 510*  --  526* 546*    Basic Metabolic Panel: Recent Labs  Lab 05/12/21 0643 05/14/21 0655 05/15/21 0605 05/16/21 0640 05/17/21 0515  NA 133* 131* 133* 132* 133*  K 3.8 3.5 3.2* 3.6 3.7  CL 99 93* 95* 96* 95*  CO2 26 28 28 26 27   GLUCOSE 144* 124* 136* 120* 128*  BUN 9 10 11 9  8  CREATININE 0.71 0.80 0.79 0.75 0.73  CALCIUM 8.2* 8.2* 8.5* 8.2* 8.3*    GFR: Estimated Creatinine Clearance: 53.8 mL/min (by C-G formula based on SCr of 0.73 mg/dL). Liver Function Tests: Recent Labs  Lab 05/12/21 0643  AST 13*  ALT 12  ALKPHOS 85  BILITOT 0.6  PROT 5.8*  ALBUMIN 2.4*    No results for input(s): LIPASE, AMYLASE in the last 168 hours. No results for input(s): AMMONIA in the last 168 hours. Coagulation Profile: No results for input(s): INR, PROTIME in the last 168 hours. Cardiac Enzymes: No results for input(s): CKTOTAL, CKMB, CKMBINDEX, TROPONINI in the last 168 hours. BNP (last 3 results) No results for input(s): PROBNP in the last 8760 hours. HbA1C: No results for input(s): HGBA1C in the last 72 hours. CBG: No results for input(s): GLUCAP in the last 168 hours. Lipid  Profile: No results for input(s): CHOL, HDL, LDLCALC, TRIG, CHOLHDL, LDLDIRECT in the last 72 hours. Thyroid Function Tests: No results for input(s): TSH, T4TOTAL, FREET4, T3FREE, THYROIDAB in the last 72 hours. Anemia Panel: No results for input(s): VITAMINB12, FOLATE, FERRITIN, TIBC, IRON, RETICCTPCT in the last 72 hours. Sepsis Labs: No results for input(s): PROCALCITON, LATICACIDVEN in the last 168 hours.  No results found for this or any previous visit (from the past 240 hour(s)).    Radiology Studies: No results found.  Scheduled Meds:  atenolol  100 mg Oral BID   feeding supplement  237 mL Oral TID BM   fentaNYL  1 patch Transdermal Q72H   lidocaine  1 patch Transdermal Q24H   multivitamin with minerals  1 tablet Oral Daily   pantoprazole  40 mg Oral BID AC   polyethylene glycol  17 g Oral Daily   sodium chloride flush  3 mL Intravenous Q12H   Continuous Infusions:   LOS: 15 days   Time spent: 25 minutes   Darliss Cheney, MD [facility]  05/18/2021, 1:33 PM  Please page via [place] and do not message via secure chat for anything urgent. Secure chat can be used for anything non urgent.  How to contact the [facility] Attending or Consulting provider [place] or covering provider during after hours [place], for this patient?  Check the care team in [facility] and look for a) attending/consulting [facility] provider listed and b) the [facility] team listed. Page or secure chat [phone]. Log into [email or web address] and use [place]'s universal password to access. If you do not have the password, please contact the hospital operator. Locate the [facility] provider you are looking for under [facility] and page to a number that you can be directly reached. If you still have difficulty reaching the provider, please page the [facility] (Director on Call) for the Hospitalists listed on amion for assistance.

## 2021-05-18 NOTE — Progress Notes (Signed)
[facility]  05/18/2021 1:58 AM  We do continue to follow peripherally awaiting results from biopsies done on 12/29.  I did call and speak with pathology yesterday who assured me they would be in her chart by later that day.  They have still not resulted this morning.  I have called again and they tell me now that pathology does not work on the weekends.  We will continue to follow peripherally awaiting results, may need a follow up phone call on Monday.  If desire to call on call pathologist, their phone number is [phone]  Ellouise Newer, PA-C

## 2021-05-19 ENCOUNTER — Encounter: Payer: Self-pay | Admitting: Radiation Oncology

## 2021-05-19 DIAGNOSIS — R918 Other nonspecific abnormal finding of lung field: Secondary | ICD-10-CM | POA: Diagnosis not present

## 2021-05-19 NOTE — Progress Notes (Signed)
PROGRESS NOTE    Robin Estrada  WTU:[id number] DOB: Jun 01, 1945 DOA: 05/02/2021 PCP: [name], MD   Brief Narrative:  76 year old woman, Russian-speaking, PMH breast cancer, admitted 12/22 with history of anorexia and weight loss, found to have anemia, cystic lesion of the pancreas, infiltrative disease in the liver and right upper lobe mass.  Seen by GI, underwent EGD showing gastritis and ulcer, seen by palliative medicine with goals to remain full code, no chemotherapy, but would consider radiation.  Underwent bronchoscopy 12/27, multiple areas biopsied but pathology unrevealing.  Underwent colonoscopy 12/29.  Several polyps removed.  Status post CT-guided biopsy by interventional radiology 1/3.  Plan for MRI of the back to further assess T11 pathologic fracture to rule out epidural extension.  Repeat lab work in the morning, likely home in the next 48 hours, doing well with PT.  Assessment & Plan:   Principal Problem:   Lung mass Active Problems:   Essential hypertension   History of breast cancer   GI bleed   Chronic blood loss anemia   Leukocytosis   Hypokalemia   Thrombocytosis   Hypoalbuminemia   Pathologic compression fracture of thoracic vertebra (HCC)   Cystic mass of pancreas   Acute on chronic diastolic CHF (congestive heart failure) (HCC)   Iron deficiency anemia   Acute gastric ulcer without hemorrhage or perforation   Wheezing   Benign neoplasm of cecum   Benign neoplasm of sigmoid colon   Therapeutic opioid induced constipation   Osseous metastasis (HCC)   Other cirrhosis of liver (HCC)   Aortic atherosclerosis (HCC)   Emphysema of lung (HCC)  Lung mass/osseous metastasis/pathologic T11 compression fracture- (present on admission): per CT: Multiple lytic osseous bone metastases. Most notably a dorsally expansile lesion in the T11 vertebral body which causes moderate to severe spinal canal stenosis.  Pain fairly well controlled on Duragesic and  Lortab.  No symptoms of myelopathy or cord compression. MRI thoracic spine shows 35% height loss on T11.  Patient seen by neurosurgery/Dr. Ellene Route who recommended radiation to the area and no other intervention.  Radiation oncology consulted, patient had simulation done Friday.  Radiation oncology is waiting for pathology results before initiating radiation next week.  Patient would not accept chemotherapy.  Oncology also saw patient. -- Concerning for primary lung cancer, also widely metastatic osseous disease noted.  Bronchoscopy specimens were unrevealing.  Status post CT-guided biopsy 1/3.  Pathology pending.  Hypertension: Fairly controlled.  Continue atenolol.  Acute on chronic diastolic CHF: Does not appear volume overloaded.  Likely euvolemic.  Lasix stopped.  Resume outpatient diuretic on discharge.  Hypokalemia: Resolved   Emphysema of lung (HCC) --asymptomatic   Other cirrhosis of liver (HCC) --seen on CT, appears stable, consider outpatient follow-up   Acute gastric ulcer without hemorrhage or perforation/chronic blood loss anemia/iron deficiency anemia- (present on admission) --EGD 12/24: 4 cm HH.  Clean-based, non-bleeding GU, erosive gastritis. Biopsy showed no significant abnormality of small bowel, negative H pylori, no inflammation/dysplasia or malignancy.  Continue PPI.  GI on board. No aspirin, ibuprofen, naproxen, or other non-steroidal anti-inflammatory drugs for 2 weeks after polyp removal.  She received IV iron here.  Will start on oral iron at discharge.  Hemoglobin dropped to 7.0 and she received 1 unit of PRBC transfusion on 05/16/2021.  She wants her IV iron to be delayed until next week.    Cystic mass of pancreas- (present on admission) -- CA 19-9 level normal.  On board and they plan to perform MRI/MRCP  to evaluate pancreas at some point in time but not urgent.    DVT prophylaxis: SCDs Start: 05/03/21 5462   Code Status: Full Code  Family Communication:  None  present at bedside.  Plan of care discussed with patient in length and he verbalized understanding and agreed with it.   Status is: Inpatient  Remains inpatient appropriate because: Awaiting pathology results  Estimated body mass index is 24.62 kg/m as calculated from the following:   Height as of this encounter: 5\' 5"  (1.651 m).   Weight as of this encounter: 67.1 kg.  Nutritional Assessment: Body mass index is 24.62 kg/m.Marland Kitchen Seen by dietician.  I agree with the assessment and plan as outlined below: Nutrition Status: Nutrition Problem: Inadequate oral intake Etiology: poor appetite Signs/Symptoms: per patient/family report Interventions: Ensure Enlive (each supplement provides 350kcal and 20 grams of protein), MVI  Skin Assessment: I have examined the patient's skin and I agree with the wound assessment as performed by the wound care RN as outlined below:    Consultants:  GI Neurosurgery  Procedures:  None  Antimicrobials:  Anti-infectives (From admission, onward)    None          Subjective: Remote video interpreter used for communication.  Patient seen and examined.  Complains of some headache but no other new complaint.  Objective: Vitals:   05/18/21 1655 05/18/21 2241 05/19/21 0451 05/19/21 0850  BP: 132/62 (!) 139/48 (!) 138/57 (!) 146/61  Pulse: 71 74 78 76  Resp: 16 16 15 16   Temp: 97.7 F (36.5 C) 99.2 F (37.3 C) 98.6 F (37 C) 98.2 F (36.8 C)  TempSrc: Oral Oral Oral Oral  SpO2: 97% 96% 95% 96%  Weight:      Height:        Intake/Output Summary (Last 24 hours) at 05/19/2021 1225 Last data filed at 05/18/2021 1500 Gross per 24 hour  Intake 300 ml  Output --  Net 300 ml    Filed Weights   05/04/21 0945 05/07/21 0815 05/09/21 1117  Weight: 67.1 kg 67.1 kg 67.1 kg    Examination:  General exam: Appears calm and comfortable  Respiratory system: Clear to auscultation. Respiratory effort normal. Cardiovascular system: S1 & S2 heard, RRR.  No JVD, murmurs, rubs, gallops or clicks. No pedal edema. Gastrointestinal system: Abdomen is nondistended, soft and nontender. No organomegaly or masses felt. Normal bowel sounds heard. Central nervous system: Alert and oriented. No focal neurological deficits. Extremities: Symmetric 5 x 5 power. Skin: No rashes, lesions or ulcers.  Psychiatry: Judgement and insight appear normal. Mood & affect appropriate.   Data Reviewed: I have personally reviewed following labs and imaging studies  CBC: Recent Labs  Lab 05/15/21 0605 05/16/21 0640 05/16/21 1937 05/17/21 0515 05/18/21 0116  WBC 11.9* 11.2*  --  12.9* 11.8*  NEUTROABS  --   --   --   --  9.4*  HGB 7.1* 7.0* 9.2* 8.4* 8.4*  HCT 24.7* 23.4* 29.2* 27.4* 26.6*  MCV 73.7* 73.1*  --  74.9* 73.9*  PLT 504* 510*  --  526* 546*    Basic Metabolic Panel: Recent Labs  Lab 05/14/21 0655 05/15/21 0605 05/16/21 0640 05/17/21 0515  NA 131* 133* 132* 133*  K 3.5 3.2* 3.6 3.7  CL 93* 95* 96* 95*  CO2 28 28 26 27   GLUCOSE 124* 136* 120* 128*  BUN 10 11 9 8   CREATININE 0.80 0.79 0.75 0.73  CALCIUM 8.2* 8.5* 8.2* 8.3*    GFR: Estimated  Creatinine Clearance: 53.8 mL/min (by C-G formula based on SCr of 0.73 mg/dL). Liver Function Tests: No results for input(s): AST, ALT, ALKPHOS, BILITOT, PROT, ALBUMIN in the last 168 hours.  No results for input(s): LIPASE, AMYLASE in the last 168 hours. No results for input(s): AMMONIA in the last 168 hours. Coagulation Profile: No results for input(s): INR, PROTIME in the last 168 hours. Cardiac Enzymes: No results for input(s): CKTOTAL, CKMB, CKMBINDEX, TROPONINI in the last 168 hours. BNP (last 3 results) No results for input(s): PROBNP in the last 8760 hours. HbA1C: No results for input(s): HGBA1C in the last 72 hours. CBG: No results for input(s): GLUCAP in the last 168 hours. Lipid Profile: No results for input(s): CHOL, HDL, LDLCALC, TRIG, CHOLHDL, LDLDIRECT in the last 72  hours. Thyroid Function Tests: No results for input(s): TSH, T4TOTAL, FREET4, T3FREE, THYROIDAB in the last 72 hours. Anemia Panel: No results for input(s): VITAMINB12, FOLATE, FERRITIN, TIBC, IRON, RETICCTPCT in the last 72 hours. Sepsis Labs: No results for input(s): PROCALCITON, LATICACIDVEN in the last 168 hours.  No results found for this or any previous visit (from the past 240 hour(s)).    Radiology Studies: No results found.  Scheduled Meds:  atenolol  100 mg Oral BID   feeding supplement  237 mL Oral TID BM   fentaNYL  1 patch Transdermal Q72H   lidocaine  1 patch Transdermal Q24H   multivitamin with minerals  1 tablet Oral Daily   pantoprazole  40 mg Oral BID AC   polyethylene glycol  17 g Oral Daily   sodium chloride flush  3 mL Intravenous Q12H   Continuous Infusions:   LOS: 16 days   Time spent: 24 minutes   Darliss Cheney, MD [facility]  05/19/2021, 12:25 PM  Please page via Shea Evans and do not message via secure chat for anything urgent. Secure chat can be used for anything non urgent.  How to contact the [facility] Attending or Consulting provider [place] or covering provider during after hours [place], for this patient?  Check the care team in [facility] and look for a) attending/consulting [facility] provider listed and b) the [facility] team listed. Page or secure chat [phone]. Log into [email or web address] and use 's universal password to access. If you do not have the password, please contact the hospital operator. Locate the [facility] provider you are looking for under [facility] and page to a number that you can be directly reached. If you still have difficulty reaching the provider, please page the [facility] (Director on Call) for the Hospitalists listed on amion for assistance.

## 2021-05-20 ENCOUNTER — Ambulatory Visit: Payer: Medicare HMO | Admitting: Radiation Oncology

## 2021-05-20 DIAGNOSIS — R918 Other nonspecific abnormal finding of lung field: Secondary | ICD-10-CM | POA: Diagnosis not present

## 2021-05-20 LAB — CBC WITH DIFFERENTIAL/PLATELET
Abs Immature Granulocytes: 0.05 10*3/uL (ref 0.00–0.07)
Basophils Absolute: 0.1 10*3/uL (ref 0.0–0.1)
Basophils Relative: 0 %
Eosinophils Absolute: 0.3 10*3/uL (ref 0.0–0.5)
Eosinophils Relative: 3 %
HCT: 28.4 % — ABNORMAL LOW (ref 36.0–46.0)
Hemoglobin: 8.7 g/dL — ABNORMAL LOW (ref 12.0–15.0)
Immature Granulocytes: 0 %
Lymphocytes Relative: 8 %
Lymphs Abs: 0.9 10*3/uL (ref 0.7–4.0)
MCH: 23.1 pg — ABNORMAL LOW (ref 26.0–34.0)
MCHC: 30.6 g/dL (ref 30.0–36.0)
MCV: 75.3 fL — ABNORMAL LOW (ref 80.0–100.0)
Monocytes Absolute: 0.8 10*3/uL (ref 0.1–1.0)
Monocytes Relative: 7 %
Neutro Abs: 9.1 10*3/uL — ABNORMAL HIGH (ref 1.7–7.7)
Neutrophils Relative %: 82 %
Platelets: 546 10*3/uL — ABNORMAL HIGH (ref 150–400)
RBC: 3.77 MIL/uL — ABNORMAL LOW (ref 3.87–5.11)
RDW: 19.9 % — ABNORMAL HIGH (ref 11.5–15.5)
WBC: 11.2 10*3/uL — ABNORMAL HIGH (ref 4.0–10.5)
nRBC: 0 % (ref 0.0–0.2)

## 2021-05-20 LAB — COMPREHENSIVE METABOLIC PANEL
ALT: 13 U/L (ref 0–44)
AST: 13 U/L — ABNORMAL LOW (ref 15–41)
Albumin: 2.3 g/dL — ABNORMAL LOW (ref 3.5–5.0)
Alkaline Phosphatase: 97 U/L (ref 38–126)
Anion gap: 14 (ref 5–15)
BUN: 8 mg/dL (ref 8–23)
CO2: 25 mmol/L (ref 22–32)
Calcium: 9 mg/dL (ref 8.9–10.3)
Chloride: 94 mmol/L — ABNORMAL LOW (ref 98–111)
Creatinine, Ser: 0.81 mg/dL (ref 0.44–1.00)
GFR, Estimated: >60 mL/min (ref >60)
Glucose, Bld: 141 mg/dL — ABNORMAL HIGH (ref 70–99)
Potassium: 3.6 mmol/L (ref 3.5–5.1)
Sodium: 133 mmol/L — ABNORMAL LOW (ref 135–145)
Total Bilirubin: 0.5 mg/dL (ref 0.3–1.2)
Total Protein: 5.9 g/dL — ABNORMAL LOW (ref 6.5–8.1)

## 2021-05-20 MED ORDER — BOOST PLUS PO LIQD
237.0000 mL | Freq: Three times a day (TID) | ORAL | Status: DC
  Administered 2021-05-20 – 2021-05-26 (×4): 237 mL via ORAL
  Filled 2021-05-20 (×19): qty 237

## 2021-05-20 NOTE — Progress Notes (Signed)
Interval note after speaking with Dr. Melina Copa, of pathology. Patient not seen today.  GI had been following peripherally after patient having undergone colonoscopy on 05/09/2021.  2 colonic polyps were removed.  Pathology for this remains pending. Subsequent CT-guided lung biopsy by Dr. Kathlene Cote  While pathology is not final, Dr. Melina Copa shared with me that the colonic polyps are malignant but not consistent with primary colon cancer.  Immunohistochemical staining has been unrevealing to this point, Dr. Saralyn Pilar with pathology is also evaluating the case. At present the working diagnosis is widely metastatic breast cancer which would unify the diagnosis in multiple other organs including colon.  Continue ulcer therapy, twice daily oral PPI sufficient. Pancreatic cystic lesion is low priority given her widely metastatic disease.  MRI/MRCP can be considered in the future, but only at the discretion of oncology depending on how she does with what is probably metastatic breast cancer.  No plan for further GI intervention during hospitalization. GI inpatient service will sign off but remain available, please call if questions

## 2021-05-20 NOTE — Progress Notes (Signed)
Physical Therapy Treatment Patient Details Name: Robin Estrada MRN: [id number] DOB: [birth year] Today's Date: 05/20/2021   History of Present Illness 76 y.o. female presents to [facility] hospital on 05/02/2021 with complaints of weakness, DOE. 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 s/p bronchoscopy 12/27 with multiple areas biopsied however pathology came back unremarkable. Current plan is for ultrasound-guided biopsy by interventional radiology on Tuesday, 1/3. 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    PT Comments    Pt admitted with above diagnosis. Pt was able to ambulate in room short distance and do a few exercises. Pt fatigues quickly and had to lay back down as she stated she wasn't comfortable in chair.  Pt limited by fatigue.  No LOB with ambulation however just has poor tolerance of activity.  Pt currently with functional limitations due to balance and endurance deficits. Pt will benefit from skilled PT to increase their independence and safety with mobility to allow discharge to the venue listed below.      Recommendations for follow up therapy are one component of a multi-disciplinary discharge planning process, led by the attending physician.  Recommendations may be updated based on patient status, additional functional criteria and insurance authorization.  Follow Up Recommendations  Outpatient PT     Assistance Recommended at Discharge PRN  Patient can return home with the following     Equipment Recommendations  None recommended by PT    Recommendations for Other Services       Precautions / Restrictions Precautions Precautions: Fall Precaution Comments: anemic Restrictions Weight Bearing Restrictions: No     Mobility  Bed Mobility Overal bed mobility: Needs Assistance Bed  Mobility: Supine to Sit     Supine to sit: Min assist     General bed mobility comments: Needed a hand to pull up from supine.    Transfers Overall transfer level: Needs assistance Equipment used: 1 person hand held assist Transfers: Sit to/from Stand Sit to Stand: Min assist;Min guard           General transfer comment: Pt needing steadying assist and a hand to hold for support    Ambulation/Gait Ambulation/Gait assistance: Min guard Gait Distance (Feet): 25 Feet Assistive device: None Gait Pattern/deviations: Step-through pattern;Decreased stride length;Narrow base of support;Scissoring Gait velocity: decreased Gait velocity interpretation: <1.31 ft/sec, indicative of household ambulator   General Gait Details: Pt would only ambulate to window and back to the chair.  sat in chair about 30 seconds and asked to go back to bed as the chair was not comfortable. Pt then walked back to bed and was positioned in bed.   Slow, mostly steady gait with pt seeming to take great caution but no LOB.   Stairs             Wheelchair Mobility    Modified Rankin (Stroke Patients Only)       Balance Overall balance assessment: Mild deficits observed, not formally tested                                          Cognition Arousal/Alertness: Awake/alert Behavior During Therapy: WFL for tasks assessed/performed Overall Cognitive Status: Within Functional Limits for tasks assessed  Exercises General Exercises - Lower Extremity Ankle Circles/Pumps: AROM;Both;5 reps;Seated Long Arc Quad: AROM;Both;5 reps;Seated Heel Slides: AAROM;Left;5 reps;Supine    General Comments        Pertinent Vitals/Pain Pain Assessment: No/denies pain    Home Living                          Prior Function            PT Goals (current goals can now be found in the care plan section) Progress towards PT  goals: Progressing toward goals    Frequency    Min 3X/week      PT Plan Current plan remains appropriate    Co-evaluation              AM-PAC PT "6 Clicks" Mobility   Outcome Measure  Help needed turning from your back to your side while in a flat bed without using bedrails?: None Help needed moving from lying on your back to sitting on the side of a flat bed without using bedrails?: None Help needed moving to and from a bed to a chair (including a wheelchair)?: None Help needed standing up from a chair using your arms (e.g., wheelchair or bedside chair)?: None Help needed to walk in hospital room?: A Little Help needed climbing 3-5 steps with a railing? : A Little 6 Click Score: 22    End of Session Equipment Utilized During Treatment: Gait belt Activity Tolerance: Patient limited by fatigue Patient left: in bed;with call bell/phone within reach Nurse Communication: Mobility status PT Visit Diagnosis: Other abnormalities of gait and mobility (R26.89);Muscle weakness (generalized) (M62.81)     Time: 1833-5825 PT Time Calculation (min) (ACUTE ONLY): 10 min  Charges:  $Gait Training: 8-22 mins                     [name] M,PT Acute Rehab Services [phone] (pager)    [name] 05/20/2021, 10:38 AM

## 2021-05-20 NOTE — Progress Notes (Signed)
PROGRESS NOTE    Robin Estrada  PFX:[id number] DOB: 02-Oct-1945 DOA: 05/02/2021 PCP: [name], MD   Brief Narrative:  76 year old woman, Russian-speaking, PMH breast cancer, admitted 12/22 with history of anorexia and weight loss, found to have anemia, cystic lesion of the pancreas, infiltrative disease in the liver and right upper lobe mass.  Seen by GI, underwent EGD showing gastritis and ulcer, seen by palliative medicine with goals to remain full code, no chemotherapy, but would consider radiation.  Underwent bronchoscopy 12/27, multiple areas biopsied but pathology unrevealing.  Underwent colonoscopy 12/29.  Several polyps removed.  Status post CT-guided biopsy by interventional radiology 1/3.  Plan for MRI of the back to further assess T11 pathologic fracture to rule out epidural extension.  Repeat lab work in the morning, likely home in the next 48 hours, doing well with PT.  Assessment & Plan:   Principal Problem:   Lung mass Active Problems:   Essential hypertension   History of breast cancer   GI bleed   Chronic blood loss anemia   Leukocytosis   Hypokalemia   Thrombocytosis   Hypoalbuminemia   Pathologic compression fracture of thoracic vertebra (HCC)   Cystic mass of pancreas   Acute on chronic diastolic CHF (congestive heart failure) (HCC)   Iron deficiency anemia   Acute gastric ulcer without hemorrhage or perforation   Wheezing   Benign neoplasm of cecum   Benign neoplasm of sigmoid colon   Therapeutic opioid induced constipation   Osseous metastasis (HCC)   Other cirrhosis of liver (HCC)   Aortic atherosclerosis (HCC)   Emphysema of lung (HCC)  Lung mass/osseous metastasis/pathologic T11 compression fracture- (present on admission): per CT: Multiple lytic osseous bone metastases. Most notably a dorsally expansile lesion in the T11 vertebral body which causes moderate to severe spinal canal stenosis.  Pain fairly well controlled on Duragesic and  Lortab.  No symptoms of myelopathy or cord compression. MRI thoracic spine shows 35% height loss on T11.  Patient seen by neurosurgery/Dr. Ellene Route who recommended radiation to the area and no other intervention.  Radiation oncology consulted, patient had simulation done Friday.  Radiation oncology is waiting for pathology results before initiating radiation next week.  Patient would not accept chemotherapy.  Oncology also saw patient.  Per GI note today, they spoke to pathologist and prelim report says that she likely has metastatic breast cancer.  Final report pending.  I have reached out to oncology to clarify the plans.  If she is going to need radiation on daily basis, could this patient be transferred to [facility]? -- Concerning for primary lung cancer, also widely metastatic osseous disease noted.  Bronchoscopy specimens were unrevealing.  Status post CT-guided biopsy 1/3.  Pathology pending.  Hypertension: Fairly controlled.  Continue atenolol.  Acute on chronic diastolic CHF: Does not appear volume overloaded.  Likely euvolemic.  Lasix stopped.  Resume outpatient diuretic on discharge.  Hypokalemia: Resolved   Emphysema of lung (HCC) --asymptomatic   Other cirrhosis of liver (HCC) --seen on CT, appears stable, consider outpatient follow-up   Acute gastric ulcer without hemorrhage or perforation/chronic blood loss anemia/iron deficiency anemia- (present on admission) --EGD 12/24: 4 cm HH.  Clean-based, non-bleeding GU, erosive gastritis. Biopsy showed no significant abnormality of small bowel, negative H pylori, no inflammation/dysplasia or malignancy.  Continue PPI.  GI on board. No aspirin, ibuprofen, naproxen, or other non-steroidal anti-inflammatory drugs for 2 weeks after polyp removal.  She received IV iron here.  Will start on oral  iron at discharge.  Hemoglobin dropped to 7.0 and she received 1 unit of PRBC transfusion on 05/16/2021.  Hemoglobin improved and stable since then.  She  wants her IV iron to be delayed for next 1 to 2 days.  Cystic mass of pancreas- (present on admission) -- CA 19-9 level normal.  On board and they plan to perform MRI/MRCP to evaluate pancreas at some point in time but not urgent.   DVT prophylaxis: SCDs Start: 05/03/21 5329   Code Status: Full Code  Family Communication:  None present at bedside.  Plan of care discussed with patient in length and he verbalized understanding and agreed with it.   Status is: Inpatient  Remains inpatient appropriate because: Awaiting pathology results  Estimated body mass index is 24.62 kg/m as calculated from the following:   Height as of this encounter: 5\' 5"  (1.651 m).   Weight as of this encounter: 67.1 kg.  Nutritional Assessment: Body mass index is 24.62 kg/m.Marland Kitchen Seen by dietician.  I agree with the assessment and plan as outlined below: Nutrition Status: Nutrition Problem: Inadequate oral intake Etiology: poor appetite Signs/Symptoms: per patient/family report Interventions: Ensure Enlive (each supplement provides 350kcal and 20 grams of protein), MVI  Skin Assessment: I have examined the patient's skin and I agree with the wound assessment as performed by the wound care RN as outlined below:    Consultants:  GI Neurosurgery  Procedures:  None  Antimicrobials:  Anti-infectives (From admission, onward)    None          Subjective:  Patient seen and examined.  She complains of mild headache but no other complaint.  Objective: Vitals:   05/19/21 1607 05/19/21 1949 05/20/21 0403 05/20/21 0834  BP: (!) 135/57 (!) 152/57 (!) 133/55 (!) 136/48  Pulse: 73 72 73 70  Resp: 16 16 16 17   Temp: 97.8 F (36.6 C) 98.3 F (36.8 C) 99 F (37.2 C) 98.2 F (36.8 C)  TempSrc: Oral Oral Oral Oral  SpO2: 98% 97% 96% 96%  Weight:      Height:        Intake/Output Summary (Last 24 hours) at 05/20/2021 1250 Last data filed at 05/20/2021 0945 Gross per 24 hour  Intake 540 ml  Output --   Net 540 ml    Filed Weights   05/04/21 0945 05/07/21 0815 05/09/21 1117  Weight: 67.1 kg 67.1 kg 67.1 kg    Examination:  General exam: Appears calm and comfortable  Respiratory system: Clear to auscultation. Respiratory effort normal. Cardiovascular system: S1 & S2 heard, RRR. No JVD, murmurs, rubs, gallops or clicks. No pedal edema. Gastrointestinal system: Abdomen is nondistended, soft and nontender. No organomegaly or masses felt. Normal bowel sounds heard. Central nervous system: Alert and oriented. No focal neurological deficits. Extremities: Symmetric 5 x 5 power. Skin: No rashes, lesions or ulcers.  Psychiatry: Judgement and insight appear normal. Mood & affect appropriate.   Data Reviewed: I have personally reviewed following labs and imaging studies  CBC: Recent Labs  Lab 05/15/21 0605 05/16/21 0640 05/16/21 1937 05/17/21 0515 05/18/21 0116 05/20/21 0920  WBC 11.9* 11.2*  --  12.9* 11.8* 11.2*  NEUTROABS  --   --   --   --  9.4* 9.1*  HGB 7.1* 7.0* 9.2* 8.4* 8.4* 8.7*  HCT 24.7* 23.4* 29.2* 27.4* 26.6* 28.4*  MCV 73.7* 73.1*  --  74.9* 73.9* 75.3*  PLT 504* 510*  --  526* 546* 546*    Basic Metabolic Panel: Recent  Labs  Lab 05/14/21 0655 05/15/21 0605 05/16/21 0640 05/17/21 0515 05/20/21 0920  NA 131* 133* 132* 133* 133*  K 3.5 3.2* 3.6 3.7 3.6  CL 93* 95* 96* 95* 94*  CO2 28 28 26 27 25   GLUCOSE 124* 136* 120* 128* 141*  BUN 10 11 9 8 8   CREATININE 0.80 0.79 0.75 0.73 0.81  CALCIUM 8.2* 8.5* 8.2* 8.3* 9.0    GFR: Estimated Creatinine Clearance: 53.2 mL/min (by C-G formula based on SCr of 0.81 mg/dL). Liver Function Tests: Recent Labs  Lab 05/20/21 0920  AST 13*  ALT 13  ALKPHOS 97  BILITOT 0.5  PROT 5.9*  ALBUMIN 2.3*    No results for input(s): LIPASE, AMYLASE in the last 168 hours. No results for input(s): AMMONIA in the last 168 hours. Coagulation Profile: No results for input(s): INR, PROTIME in the last 168 hours. Cardiac  Enzymes: No results for input(s): CKTOTAL, CKMB, CKMBINDEX, TROPONINI in the last 168 hours. BNP (last 3 results) No results for input(s): PROBNP in the last 8760 hours. HbA1C: No results for input(s): HGBA1C in the last 72 hours. CBG: No results for input(s): GLUCAP in the last 168 hours. Lipid Profile: No results for input(s): CHOL, HDL, LDLCALC, TRIG, CHOLHDL, LDLDIRECT in the last 72 hours. Thyroid Function Tests: No results for input(s): TSH, T4TOTAL, FREET4, T3FREE, THYROIDAB in the last 72 hours. Anemia Panel: No results for input(s): VITAMINB12, FOLATE, FERRITIN, TIBC, IRON, RETICCTPCT in the last 72 hours. Sepsis Labs: No results for input(s): PROCALCITON, LATICACIDVEN in the last 168 hours.  No results found for this or any previous visit (from the past 240 hour(s)).    Radiology Studies: No results found.  Scheduled Meds:  atenolol  100 mg Oral BID   feeding supplement  237 mL Oral TID BM   fentaNYL  1 patch Transdermal Q72H   lidocaine  1 patch Transdermal Q24H   multivitamin with minerals  1 tablet Oral Daily   pantoprazole  40 mg Oral BID AC   polyethylene glycol  17 g Oral Daily   sodium chloride flush  3 mL Intravenous Q12H   Continuous Infusions:   LOS: 17 days   Time spent: 25 minutes   Darliss Cheney, MD [facility]  05/20/2021, 12:50 PM  Please page via [facility] and do not message via secure chat for anything urgent. Secure chat can be used for anything non urgent.  How to contact the [facility] Attending or Consulting provider [place] or covering provider during after hours [place], for this patient?  Check the care team in [facility] and look for a) attending/consulting [facility] provider listed and b) the [facility] team listed. Page or secure chat [phone]. Log into [email or web address] and use [facility]'s universal password to access. If you do not have the password, please contact the hospital operator. Locate the [facility] provider you are looking for under [facility] and  page to a number that you can be directly reached. If you still have difficulty reaching the provider, please page the [facility] (Director on Call) for the Hospitalists listed on amion for assistance.

## 2021-05-20 NOTE — Progress Notes (Signed)
Nutrition Follow-up  DOCUMENTATION CODES:  Not applicable  INTERVENTION:  Continue regular diet.  Discontinue Ensure TID.  Add Boost Plus po TID, each supplement provides 360 kcal and 14 grams of protein.  Continue MVI with minerals daily.  Encourage PO and supplement intake.  NUTRITION DIAGNOSIS:  Inadequate oral intake related to poor appetite as evidenced by per patient/family report.  GOAL:  Patient will meet greater than or equal to 90% of their needs  MONITOR:  PO intake, Supplement acceptance  REASON FOR ASSESSMENT:  Malnutrition Screening Tool    ASSESSMENT:  76 y.o. 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 In the ED, hemoglobin was 6.9 and stool guaiac noted to be positive. Imaging showed right upper lobe mass concerning for primary pulmonary neoplasm as well as diffuse metastatic osseous disease with T11 pathologic compression fracture. She declined blood products in the ED. Admitted to [facility].  Repeat lab work in the morning, likely home in the next 48 hours, per MD.  Concerning for primary lung cancer, also widely metastatic osseous disease noted.   Attempted to speak with pt at bedside. Pt reported "I am about to go to sleep. Go away."  RD promptly respected patient's wishes and left.   Pt with varied intake over the past 8 meals. Pt with an average of 51% (10-100%).  Pt in need of new weight. RD to order.  RD to trial Boost Plus TID and discontinue Ensure TID due to refusal.  Supplements: Ensure TID (mostly refusing)  Medications: reviewed; MVI with minerals, Vicodin PO PRN (given once today)  Labs: reviewed; Na 133 (L), Glucose 141 (H)  Diet Order:   Diet Order             Diet regular Room service  appropriate? Yes; Fluid consistency: Thin  Diet effective now                  EDUCATION NEEDS:  No education needs have been identified at this time  Skin:  Skin Assessment: Skin Integrity Issues: Skin Integrity Issues:: Incisions Incisions: Lateral R back, closed (1/3)  Last BM:  05/16/21  Height:  Ht Readings from Last 1 Encounters:  05/09/21 5\' 5"  (1.651 m)   Weight:  Wt Readings from Last 1 Encounters:  05/09/21 67.1 kg   BMI:  Body mass index is 24.62 kg/m.  Estimated Nutritional Needs:  Kcal:  1700-1900 Protein:  85-95 grams Fluid:  >1.7L  Derrel Nip, RD, LDN (she/her/hers) Clinical Inpatient Dietitian RD Pager/After-Hours/Weekend Pager # in [phone]

## 2021-05-20 NOTE — Progress Notes (Signed)
OT Cancellation Note  Patient Details Name: Robin Estrada MRN: [id number] DOB: 1946/02/08   Cancelled Treatment:    Reason Eval/Treat Not Completed: Patient declined, no reason specified. Pt states her son is coming now and she wants therapy to come back in the morning.  Lynnda Child, OTD, OTR/L Acute Rehab [phone]   Kaylyn Lim 05/20/2021, 4:41 PM

## 2021-05-21 ENCOUNTER — Other Ambulatory Visit: Payer: Self-pay | Admitting: Hematology

## 2021-05-21 ENCOUNTER — Ambulatory Visit: Payer: Medicare HMO | Admitting: Radiation Oncology

## 2021-05-21 ENCOUNTER — Ambulatory Visit: Payer: Medicare HMO

## 2021-05-21 DIAGNOSIS — R918 Other nonspecific abnormal finding of lung field: Secondary | ICD-10-CM | POA: Diagnosis not present

## 2021-05-21 DIAGNOSIS — Z853 Personal history of malignant neoplasm of breast: Secondary | ICD-10-CM | POA: Diagnosis not present

## 2021-05-21 DIAGNOSIS — M898X9 Other specified disorders of bone, unspecified site: Secondary | ICD-10-CM

## 2021-05-21 DIAGNOSIS — D500 Iron deficiency anemia secondary to blood loss (chronic): Secondary | ICD-10-CM

## 2021-05-21 DIAGNOSIS — C50919 Malignant neoplasm of unspecified site of unspecified female breast: Secondary | ICD-10-CM

## 2021-05-21 DIAGNOSIS — K253 Acute gastric ulcer without hemorrhage or perforation: Secondary | ICD-10-CM | POA: Diagnosis not present

## 2021-05-21 NOTE — Progress Notes (Signed)
Occupational Therapy Treatment °Patient Details °Name: Robin Estrada °MRN: [id number] °DOB: 08/26/1945 °Today's Date: 05/21/2021 ° ° °History of present illness 75 y.o. female presents to [facility] on 05/02/2021 with complaints of weakness, DOE. 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 s/p bronchoscopy 12/27 with multiple areas biopsied however pathology came back unremarkable. Current plan is for ultrasound-guided biopsy by interventional radiology on Tuesday, 1/3. 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 °  °OT comments ° Pt progressing, meeting 2/3 goals this session. Pt continues to remain mod I for ADL tasks and supervision for transfers. Pt demonstrates generalized weakness and decreased activity tolerance and fatigues quickly, requesting to return to bed. Educated pt on the importance of maintaining endurance while in the hospital, encouraged OOB mobility when able. Pt presenting with impairments listed below, will continue to follow acutely. Recommend d/c home with HHOT.  ° °Recommendations for follow up therapy are one component of a multi-disciplinary discharge planning process, led by the attending physician.  Recommendations may be updated based on patient status, additional functional criteria and insurance authorization. °   °Follow Up Recommendations ° Home health OT  °  °Assistance Recommended at Discharge Set up Supervision/Assistance  °Patient can return home with the following ° A little help with walking and/or transfers;A little help with bathing/dressing/bathroom;Assist for transportation;Help with stairs or ramp for entrance;Assistance with cooking/housework °  °Equipment Recommendations ° Tub/shower seat  °  °Recommendations for Other Services PT consult ° °  °Precautions / Restrictions  Precautions °Precautions: Fall °Precaution Comments: anemic °Restrictions °Weight Bearing Restrictions: No  ° ° °  ° °Mobility Bed Mobility °Overal bed mobility: Needs Assistance °  °  °  °  °  °  °General bed mobility comments: pt up in room upon arrival °  ° °Transfers °Overall transfer level: Needs assistance °Equipment used: None °Transfers: Sit to/from Stand °Sit to Stand: Supervision °  °  °  °  °  °  °  °  °Balance Overall balance assessment: Mild deficits observed, not formally tested °  °  °  °  °  °  °  °  °  °  °  °  °  °  °  °  °  °  °   ° °ADL either performed or assessed with clinical judgement  ° °ADL   °  °  °Grooming: Wash/dry hands;Standing;Modified independent °Grooming Details (indicate cue type and reason): completed standing at sink °  °  °  °  °  °  °  °  °Toilet Transfer: Modified Independent;Ambulation;Regular Toilet °Toilet Transfer Details (indicate cue type and reason): pt up in bathroom upon arrival, able to walk household distance and perform simulated toilet transfer in room °  °  °  °  °Functional mobility during ADLs: Modified independent °  °  ° °Extremity/Trunk Assessment Upper Extremity Assessment °Upper Extremity Assessment: Overall WFL for tasks assessed °  °Lower Extremity Assessment °Lower Extremity Assessment: Defer to PT evaluation °  °  °  ° °Vision   °Vision Assessment?: No apparent visual deficits °  °Perception Perception °Perception: Not tested °  °Praxis Praxis °Praxis: Not tested °  ° °Cognition Arousal/Alertness: Awake/alert °Behavior During Therapy: WFL for tasks assessed/performed °Overall Cognitive Status: Within Functional Limits for tasks assessed °  °  °  °  °  °  °  °  °  °  °  °  °  °  °  °  °  °  °  °  °   °  Exercises     Shoulder Instructions       General Comments Pt agreeable to mobility within room only, encouraged pt to continue working with therapy to improve activity tolerance    Pertinent Vitals/ Pain       Pain Assessment: No/denies  pain  Home Living                                          Prior Functioning/Environment              Frequency  Min 2X/week        Progress Toward Goals  OT Goals(current goals can now be found in the care plan section)  Progress towards OT goals: Goals met and updated - see care plan  Acute Rehab OT Goals Patient Stated Goal: return to PLOF OT Goal Formulation: With patient Time For Goal Achievement: 06/04/21 Potential to Achieve Goals: Good ADL Goals Pt Will Perform Lower Body Dressing: Independently;sit to/from stand Pt Will Transfer to Toilet: Independently;ambulating;regular height toilet Pt Will Perform Tub/Shower Transfer: Independently;ambulating;shower seat Additional ADL Goal #1: Pt will be able to complete OOB standing activity for 10 minutes with supervision in order to improve activity tolerance for ADLs prior to d/c.  Plan Discharge plan remains appropriate;Frequency remains appropriate    Co-evaluation                 AM-PAC OT "6 Clicks" Daily Activity     Outcome Measure   Help from another person eating meals?: None Help from another person taking care of personal grooming?: None Help from another person toileting, which includes using toliet, bedpan, or urinal?: None Help from another person bathing (including washing, rinsing, drying)?: A Little Help from another person to put on and taking off regular upper body clothing?: None Help from another person to put on and taking off regular lower body clothing?: A Little 6 Click Score: 22    End of Session    OT Visit Diagnosis: Unsteadiness on feet (R26.81);Other abnormalities of gait and mobility (R26.89);Muscle weakness (generalized) (M62.81)   Activity Tolerance Patient tolerated treatment well   Patient Left in bed;with call bell/phone within reach;Other (comment) (sitting EOB to eat lunch)   Nurse Communication Mobility status        Time: 9528-4132 OT Time  Calculation (min): 12 min  Charges: OT General Charges $OT Visit: 1 Visit OT Treatments $Therapeutic Activity: 8-22 mins  Lynnda Child, OTD, OTR/L Acute Rehab [phone] 05/21/2021, 3:40 PM

## 2021-05-21 NOTE — Progress Notes (Signed)
PROGRESS NOTE    Robin Estrada  IRW:[id number] DOB: 28-Nov-1945 DOA: 05/02/2021 PCP: [name], MD   Brief Narrative:  76 year old woman, Russian-speaking, PMH breast cancer, admitted 12/22 with history of anorexia and weight loss, found to have anemia, cystic lesion of the pancreas, infiltrative disease in the liver and right upper lobe mass.  Seen by GI, underwent EGD showing gastritis and ulcer, seen by palliative medicine with goals to remain full code, no chemotherapy, but would consider radiation.  Underwent bronchoscopy 12/27, multiple areas biopsied but pathology unrevealing.  Underwent colonoscopy 12/29.  Several polyps removed.  Status post CT-guided biopsy by interventional radiology 1/3.  Plan for MRI of the back to further assess T11 pathologic fracture to rule out epidural extension.  Repeat lab work in the morning, likely home in the next 48 hours, doing well with PT.  Assessment & Plan:   Principal Problem:   Lung mass Active Problems:   Essential hypertension   History of breast cancer   GI bleed   Chronic blood loss anemia   Leukocytosis   Hypokalemia   Thrombocytosis   Hypoalbuminemia   Pathologic compression fracture of thoracic vertebra (HCC)   Cystic mass of pancreas   Acute on chronic diastolic CHF (congestive heart failure) (HCC)   Iron deficiency anemia   Acute gastric ulcer without hemorrhage or perforation   Wheezing   Benign neoplasm of cecum   Benign neoplasm of sigmoid colon   Therapeutic opioid induced constipation   Osseous metastasis (HCC)   Other cirrhosis of liver (HCC)   Aortic atherosclerosis (HCC)   Emphysema of lung (HCC)  Lung mass/osseous metastasis/pathologic T11 compression fracture- (present on admission): per CT: Multiple lytic osseous bone metastases. Most notably a dorsally expansile lesion in the T11 vertebral body which causes moderate to severe spinal canal stenosis.  Pain fairly well controlled on Duragesic and  Lortab.  No symptoms of myelopathy or cord compression. MRI thoracic spine shows 35% height loss on T11.  Patient seen by neurosurgery/Dr. Ellene Route who recommended radiation to the area and no other intervention.  Radiation oncology consulted, patient had simulation done Friday.   Oncology also saw patient.  Per GI note 05/20/2021, they spoke to pathologist and prelim report says that she likely has metastatic breast cancer.  Per radiation oncology APP, final path report is still pending.  I had a lengthy secure chat discussion with oncology PA Altamese Dilling and radiation oncology PA Worthy Flank.  I was informed that radiation oncology recommended starting radiation now even before final pathology report is pending however patient is not consenting to starting radiation until she knows final pathology report here and in fact she is requesting to send the specimen to [facility].  Per radiation oncology, it takes 2 weeks to hear back from [facility].  It is not practical or ideal to keep this patient in the hospital until she mentally prepared herself to start radiation as recommended by oncologist.  I then had lengthy discussion with the patient today as well as her son over the video call.  I explained to her what radiation oncology is recommending and that if she is not consenting to that, we will need to start working on discharging her home until she makes her mind.  After lengthy discussion, she requested to talk to Dr. Lisbeth Renshaw.  I have conveyed her message to radiation oncology APP.  At this point in time, I defer further communication regarding her consent, radiation to oncology and radiation oncology  and I will be waiting to hear from them about neck steps in the plan of care.  Hypertension: Fairly controlled.  Continue atenolol.  Acute on chronic diastolic CHF: Does not appear volume overloaded.  Likely euvolemic.  Lasix stopped.  Resume outpatient diuretic on discharge.  Hypokalemia: Resolved    Emphysema of lung (HCC) --asymptomatic   Other cirrhosis of liver (HCC) --seen on CT, appears stable, consider outpatient follow-up   Acute gastric ulcer without hemorrhage or perforation/chronic blood loss anemia/iron deficiency anemia- (present on admission) --EGD 12/24: 4 cm HH.  Clean-based, non-bleeding GU, erosive gastritis. Biopsy showed no significant abnormality of small bowel, negative H pylori, no inflammation/dysplasia or malignancy.  Continue PPI.  GI on board. No aspirin, ibuprofen, naproxen, or other non-steroidal anti-inflammatory drugs for 2 weeks after polyp removal.  She received IV iron here.  Will start on oral iron at discharge.  Hemoglobin dropped to 7.0 and she received 1 unit of PRBC transfusion on 05/16/2021.  Hemoglobin improved and stable since then.  She wants her IV iron to be delayed for next 1 to 2 days.  Cystic mass of pancreas- (present on admission) -- CA 19-9 level normal.  On board and they plan to perform MRI/MRCP to evaluate pancreas at some point in time but not urgent.   DVT prophylaxis: SCDs Start: 05/03/21 5176   Code Status: Full Code  Family Communication:  None present at bedside.  Plan of care discussed with patient And her husband in length.  Status is: Inpatient  Remains inpatient appropriate because: Awaiting pathology results  Estimated body mass index is 24.62 kg/m as calculated from the following:   Height as of this encounter: 5\' 5"  (1.651 m).   Weight as of this encounter: 67.1 kg.  Nutritional Assessment: Body mass index is 24.62 kg/m.Marland Kitchen Seen by dietician.  I agree with the assessment and plan as outlined below: Nutrition Status: Nutrition Problem: Inadequate oral intake Etiology: poor appetite Signs/Symptoms: per patient/family report Interventions: Boost Plus, MVI  Skin Assessment: I have examined the patient's skin and I agree with the wound assessment as performed by the wound care RN as outlined below:    Consultants:   GI Neurosurgery  Procedures:  None  Antimicrobials:  Anti-infectives (From admission, onward)    None          Subjective:  Patient seen and examined.  Complains of back pain but that is stable.  No new complaint.  Objective: Vitals:   05/20/21 1941 05/20/21 2137 05/21/21 0450 05/21/21 0757  BP: 138/75 (!) 163/53 (!) 154/57 (!) 155/52  Pulse: 72 75 72 73  Resp: 16  16 18   Temp: 97.7 F (36.5 C)  98.1 F (36.7 C) 97.9 F (36.6 C)  TempSrc: Oral  Oral Oral  SpO2: 97%  95% 95%  Weight:      Height:        Intake/Output Summary (Last 24 hours) at 05/21/2021 1300 Last data filed at 05/21/2021 0857 Gross per 24 hour  Intake 690 ml  Output --  Net 690 ml    Filed Weights   05/04/21 0945 05/07/21 0815 05/09/21 1117  Weight: 67.1 kg 67.1 kg 67.1 kg    Examination:  General exam: Appears calm and comfortable  Respiratory system: Clear to auscultation. Respiratory effort normal. Cardiovascular system: S1 & S2 heard, RRR. No JVD, murmurs, rubs, gallops or clicks. No pedal edema. Gastrointestinal system: Abdomen is nondistended, soft and nontender. No organomegaly or masses felt. Normal bowel sounds heard. Central  nervous system: Alert and oriented. No focal neurological deficits. Extremities: Symmetric 5 x 5 power. Skin: No rashes, lesions or ulcers.  Psychiatry: Judgement and insight appear normal. Mood & affect appropriate.   Data Reviewed: I have personally reviewed following labs and imaging studies  CBC: Recent Labs  Lab 05/15/21 0605 05/16/21 0640 05/16/21 1937 05/17/21 0515 05/18/21 0116 05/20/21 0920  WBC 11.9* 11.2*  --  12.9* 11.8* 11.2*  NEUTROABS  --   --   --   --  9.4* 9.1*  HGB 7.1* 7.0* 9.2* 8.4* 8.4* 8.7*  HCT 24.7* 23.4* 29.2* 27.4* 26.6* 28.4*  MCV 73.7* 73.1*  --  74.9* 73.9* 75.3*  PLT 504* 510*  --  526* 546* 546*    Basic Metabolic Panel: Recent Labs  Lab 05/15/21 0605 05/16/21 0640 05/17/21 0515 05/20/21 0920  NA 133*  132* 133* 133*  K 3.2* 3.6 3.7 3.6  CL 95* 96* 95* 94*  CO2 28 26 27 25   GLUCOSE 136* 120* 128* 141*  BUN 11 9 8 8   CREATININE 0.79 0.75 0.73 0.81  CALCIUM 8.5* 8.2* 8.3* 9.0    GFR: Estimated Creatinine Clearance: 53.2 mL/min (by C-G formula based on SCr of 0.81 mg/dL). Liver Function Tests: Recent Labs  Lab 05/20/21 0920  AST 13*  ALT 13  ALKPHOS 97  BILITOT 0.5  PROT 5.9*  ALBUMIN 2.3*    No results for input(s): LIPASE, AMYLASE in the last 168 hours. No results for input(s): AMMONIA in the last 168 hours. Coagulation Profile: No results for input(s): INR, PROTIME in the last 168 hours. Cardiac Enzymes: No results for input(s): CKTOTAL, CKMB, CKMBINDEX, TROPONINI in the last 168 hours. BNP (last 3 results) No results for input(s): PROBNP in the last 8760 hours. HbA1C: No results for input(s): HGBA1C in the last 72 hours. CBG: No results for input(s): GLUCAP in the last 168 hours. Lipid Profile: No results for input(s): CHOL, HDL, LDLCALC, TRIG, CHOLHDL, LDLDIRECT in the last 72 hours. Thyroid Function Tests: No results for input(s): TSH, T4TOTAL, FREET4, T3FREE, THYROIDAB in the last 72 hours. Anemia Panel: No results for input(s): VITAMINB12, FOLATE, FERRITIN, TIBC, IRON, RETICCTPCT in the last 72 hours. Sepsis Labs: No results for input(s): PROCALCITON, LATICACIDVEN in the last 168 hours.  No results found for this or any previous visit (from the past 240 hour(s)).    Radiology Studies: No results found.  Scheduled Meds:  atenolol  100 mg Oral BID   fentaNYL  1 patch Transdermal Q72H   lactose free nutrition  237 mL Oral TID WC   lidocaine  1 patch Transdermal Q24H   multivitamin with minerals  1 tablet Oral Daily   pantoprazole  40 mg Oral BID AC   polyethylene glycol  17 g Oral Daily   sodium chloride flush  3 mL Intravenous Q12H   Continuous Infusions:   LOS: 18 days   Time spent: 35 minutes   Darliss Cheney, MD [facility]  05/21/2021, 1:00 PM  Please page via [place] and do not message via secure chat for anything urgent. Secure chat can be used for anything non urgent.  How to contact the [facility] Attending or Consulting provider [place] or covering provider during after hours [place], for this patient?  Check the care team in [facility] and look for a) attending/consulting [facility] provider listed and b) the [facility] team listed. Page or secure chat [phone]. Log into [email or web address] and use [place]'s universal password to access. If  you do not have the password, please contact the hospital operator. Locate the [facility] provider you are looking for under [facility] and page to a number that you can be directly reached. If you still have difficulty reaching the provider, please page the [facility] (Director on Call) for the Hospitalists listed on amion for assistance.

## 2021-05-21 NOTE — Progress Notes (Signed)
Daily Progress Note   Patient Name: Robin Estrada       Date: 05/21/2021 DOB: 05-04-1946  Age: 76 y.o. MRN#: [id number] Attending Physician: Darliss Cheney, MD Primary Care Physician: [name], MD Admit Date: 05/02/2021   HPI/Patient Profile: 77 y.o. 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 In the ED, hemoglobin was 6.9 and stool guaiac noted to be positive. Imaging showed right upper lobe mass concerning for primary pulmonary neoplasm as well as diffuse metastatic osseous disease with T11 pathologic compression fracture. She declined blood products in the ED. Admitted to [facility].   Subjective: I was asked by Mikey Bussing NP with oncology to see this patient again regarding pain mediation.   Per MAR review, patient has received 2 doses of IV morphine and 2 doses of Vicodin the last 48 hours.    I met with patient and son at bedside. She reports her pain is mainly in the area of the T11 compression fracture. She states pain has been well controlled with Vicodin. I let her now she can take it as often as every 6 hours if needed. She prefers not to receive IV morphine - will discontinue. We discussed the option to start corticosteroid therapy, which can be effective to treat bone pain and may improve her appetite as well. Patient verbalizes understanding, but does not want to start a steroid at this time.   We reviewed plan of care, which is to start radiation therapy tomorrow. We also briefly discussed code status. Patient wishes to remain full code  at this time.    Length of Stay: 18  Objective:  Physical Exam Vitals reviewed.  Constitutional:      General: She is not in acute distress.    Appearance: She is ill-appearing.  Pulmonary:     Effort: Pulmonary effort is normal.  Neurological:     Mental Status: She is alert and oriented to person, place, and time.  Psychiatric:        Behavior: Behavior normal.            Vital Signs: BP (!) 155/52 (BP Location: Right Arm)  Pulse 73    Temp 97.9 F (36.6 C) (Oral)    Resp 18    Ht 5' 5"  (1.651 m)    Wt 67.1 kg    SpO2 95%    BMI 24.62 kg/m  SpO2: SpO2: 95 % O2 Device: O2 Device: Room Air O2 Flow Rate: O2 Flow Rate (L/min): 2 L/min    LBM: Last BM Date: 05/16/21 Baseline Weight: Weight: 67.1 kg Most recent weight: Weight: 67.1 kg       Palliative Assessment/Data: PPS 40-50%      Palliative Care Assessment & Plan   Assessment: - lung mass/osseous metastasis/pathologic T11 compression fracture - acute on chronic diastolic CHF - hypokalemia, resolved - other cirrhosis of liver - cystic mass of pancreas - acute gastric ulcer without hemorrhage or perforation - Chronic blood loss anemia  Recommendations/Plan: Continue hydrocodone-acetaminophen (VICODIN) 5-325 mg every 6 hours as needed for pain Continue fentanyl 12 mcg duragesic patch Continue lidocaine 5% patch Discontinue IV morphine Continue miralax daily Add senna 1-2 tablets daily prn If needed, consider dexamethasone 4 mg daily (patient declines at [facility] time)  Code Status: Full   Prognosis:  Unable to determine  Discharge Planning: To Be Determined    Thank you for allowing the Palliative Medicine Team to assist in the care of this patient.  MDM - High due to: 1 or more chronic illnesses with severe exacerbation, progression, or side effects of treatment OR acute or chronic illness or injury that poses a threat to life or bodily function Review of prior external notes, review of test  results, assessment requiring an independent historian Discussion or decision not to resuscitate or to de-escalate care  Decision regarding parenteral controlled substance   Lavena Bullion, NP  Please contact Palliative Medicine Team phone at [phone] for questions and concerns.

## 2021-05-21 NOTE — Progress Notes (Addendum)
HEMATOLOGY-ONCOLOGY PROGRESS NOTE  ASSESSMENT AND PLAN: 1.  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  Pathologic T11 compression fracture with impending cord. 3.  Anemia secondary to iron deficiency and chronic disease 4.  Gastric ulcer 5.  Cirrhosis of the liver 6.  Emphysema 7.  Cystic mass of the pancreas 8.  Hyponatremia 9.  Acute on chronic diastolic CHF  -Discussed with Dr. Melina Copa of pathology who indicates that the process in the colon is the same as the process in the lung.  Overall, this process is malignant appears to be most consistent with her 2019 biopsy which was malignant phyllodes tumor.  Additional stains are pending and pathology is planning to send her sample to [facility] per patient request. -I placed a call to the patient's son to discuss preliminary biopsy results with him.  He indicates that they are waiting to hear from the radiation oncology team to decide if they want to proceed with radiation or not.  The patient is unwilling to commit to radiation until she hears from them.  I did recommend to the patient's son that she proceed with palliative radiation to reduce risk of cord compression. -From medical oncology standpoint, we will plan to see her 2 to 3 weeks to discuss her final pathology results and systemic treatment options.  I have sent a scheduling message. -I have reached out to the palliative care team to review her pain medications to try to minimize need for IV pain medication.  Unsure if patient would benefit from addition of dexamethasone for bone pain and/or increase in fentanyl patch dosing.  However, will defer to the palliative care team.  Mikey Bussing, DNP, AGPCNP-BC, AOCNP  SUBJECTIVE: Patient was due to begin palliative radiation to her spine.  However, she has been awaiting final pathology before proceeding with this.  The patient was sleeping very quietly and I did not awaken her.  I did discuss with  nursing they state that her pain is fairly well controlled at this time but that she needs hydrocodone several times a day.  She is also taking intermittent IV pain medication.  She is on a fentanyl patch at 12 mcg/h.  REVIEW OF SYSTEMS:   Review of Systems  Reason unable to perform ROS: Pt sleeping - did not awaken.   I have reviewed the past medical history, past surgical history, social history and family history with the patient and they are unchanged from previous note.   PHYSICAL EXAMINATION: ECOG PERFORMANCE STATUS: 2 - Symptomatic, <50% confined to bed  Vitals:   05/21/21 0450 05/21/21 0757  BP: (!) 154/57 (!) 155/52  Pulse: 72 73  Resp: 16 18  Temp: 98.1 F (36.7 C) 97.9 F (36.6 C)  SpO2: 95% 95%   Filed Weights   05/04/21 0945 05/07/21 0815 05/09/21 1117  Weight: 67.1 kg 67.1 kg 67.1 kg    Intake/Output from previous day: 01/09 0701 - 01/10 0700 In: 710 [P.O.:710] Out: -   Physical Exam Constitutional:      General: She is not in acute distress.   LABORATORY DATA:  I have reviewed the data as listed CMP Latest Ref Rng & Units 05/20/2021 05/17/2021 05/16/2021  Glucose 70 - 99 mg/dL 141(H) 128(H) 120(H)  BUN 8 - 23 mg/dL _0 Creatinine 0.44 - 1.00 mg/dL 0.81 0.73 0.75  Sodium 135 - 145 mmol/L 133(L) 133(L) 132(L)  Potassium 3.5 - 5.1 mmol/L 3.6 3.7 3.6  Chloride 98 -  111 mmol/L 94(L) 95(L) 96(L)  CO2 22 - 32 mmol/L _0 Calcium 8.9 - 10.3 mg/dL 9.0 8.3(L) 8.2(L)  Total Protein 6.5 - 8.1 g/dL 5.9(L) - -  Total Bilirubin 0.3 - 1.2 mg/dL 0.5 - -  Alkaline Phos 38 - 126 U/L 97 - -  AST 15 - 41 U/L 13(L) - -  ALT 0 - 44 U/L 13 - -    Lab Results  Component Value Date   WBC 11.2 (H) 05/20/2021   HGB 8.7 (L) 05/20/2021   HCT 28.4 (L) 05/20/2021   MCV 75.3 (L) 05/20/2021   PLT 546 (H) 05/20/2021   NEUTROABS 9.1 (H) 05/20/2021    Lab Results  Component Value Date   CAN199 7 05/04/2021    DG Chest 2 View  Result Date: 05/03/2021 CLINICAL DATA:   Weakness history of breast mass, initial encounter EXAM: CHEST - 2 VIEW COMPARISON:  11/09/2017 FINDINGS: Check shadow is mildly enlarged. The lungs are well aerated bilaterally. There is a 3.9 x 3.0 cm rounded soft tissue mass lesion projecting in the posterior aspect of the right upper lobe along the major fissure. This was not seen on the prior exam and is felt to represent a primary pulmonary neoplasm till proven otherwise. No bony abnormality is noted. IMPRESSION: Right upper lobe mass. CT of the chest with contrast is recommended for further evaluation. Electronically Signed   By: Inez Catalina M.D.   On: 05/03/2021 00:09   CT Head Wo Contrast  Result Date: 05/03/2021 CLINICAL DATA:  76 year old female with headache weakness, decreased P.O. Right side flank pain for 3 months. EXAM: CT HEAD WITHOUT CONTRAST TECHNIQUE: Contiguous axial images were obtained from the base of the skull through the vertex without intravenous contrast. COMPARISON:  Head CT 06/02/2015. FINDINGS: Brain: Cerebral volume is within normal limits for age. No midline shift, ventriculomegaly, mass effect, evidence of mass lesion, intracranial hemorrhage or evidence of cortically based acute infarction. Gray-white matter differentiation is within normal limits throughout the brain. Vascular: Mild Calcified atherosclerosis at the skull base. No suspicious intracranial vascular hyperdensity. Skull: Stable, negative. Sinuses/Orbits: Visualized paranasal sinuses and mastoids are clear. Tympanic cavities are clear. Other: Visualized orbits and scalp soft tissues are within normal limits. IMPRESSION: Normal for age noncontrast Head CT. Electronically Signed   By: Genevie Ann M.D.   On: 05/03/2021 04:49   CT CHEST W CONTRAST  Result Date: 05/03/2021 CLINICAL DATA:  Concern for lung nodule.  Abnormal x-ray EXAM: CT CHEST WITH CONTRAST TECHNIQUE: Multidetector CT imaging of the chest was performed during intravenous contrast administration.  CONTRAST:  25m OMNIPAQUE IOHEXOL 300 MG/ML  SOLN COMPARISON:  Chest x-ray earlier the same day FINDINGS: Cardiovascular: Heart is enlarged. No pericardial effusion identified. Main pulmonary artery is normal caliber. Thoracic aorta is normal caliber with mild atherosclerotic plaques. Mediastinum/Nodes: A few enlarged mediastinal lymph nodes measuring up to 11 mm in short axis in the superior mediastinum and 10 mm in short axis precarinal. Enlarged right hilar lymph node measuring 11 mm in short axis. No axillary lymphadenopathy identified. Subcentimeter right thyroid lobe nodule noted. Lungs/Pleura: Lungs are hyperinflated with mild emphysematous changes and mild hazy mosaic attenuation throughout. There is a solid centrally necrotic mass identified in the posterior aspect of the right upper lobe which measures 2.7 x 3.8 x 2.8 cm and partially abuts the major fissure and the posterolateral pleura. The mass demonstrates lobulated slightly irregular borders. 6 mm pleural-based pulmonary nodule in the posterior aspect of  the right upper lobe, superior to the mass. A few small adjacent pulmonary nodular densities in the left upper lobe subpleural measuring up to 4 mm in size. Small right and trace left pleural effusions. No pneumothorax. Upper Abdomen: No acute process identified in the visualized upper abdomen. Nodular contour of the liver suggesting cirrhosis. At least 3 cystic density lesions in the pancreas measuring up to 16 mm in the tail the pancreas. Musculoskeletal: Multiple lytic likely metastatic bone lesions identified in the spine and ribs. Largest lytic lesion is in the T11 vertebral body which is expansile and extends 8 mm dorsal to the posterior vertebral body margin and causes moderate to severe spinal canal stenosis. IMPRESSION: 1. Solid and centrally necrotic lung mass in the right upper lobe as described, consistent with primary malignancy. 2. Emphysematous changes of the lungs. A few additional  small pulmonary nodules bilaterally. 3. Small right and trace left pleural effusions. 4. Mediastinal and right hilar mild adenopathy, possibly metastatic. 5. Multiple lytic osseous bone metastases. Most notably a dorsally expansile lesion in the T11 vertebral body which causes moderate to severe spinal canal stenosis. Correlate clinically for need for MRI evaluation. 6. Chronic findings in the abdomen including hepatic cirrhosis and at least 3 hypodense cystic lesions in the pancreas which may represent cysts, pseudocysts, or side branch IPMN. Aortic Atherosclerosis (ICD10-I70.0) and Emphysema (ICD10-J43.9). Electronically Signed   By: Ofilia Neas M.D.   On: 05/03/2021 10:34   MR THORACIC SPINE W WO CONTRAST  Result Date: 05/15/2021 CLINICAL DATA:  T11 metastatic disease.  Right upper lobe lung mass. EXAM: MRI THORACIC WITHOUT AND WITH CONTRAST TECHNIQUE: Multiplanar and multiecho pulse sequences of the thoracic spine were obtained without and with intravenous contrast. CONTRAST:  6.53m GADAVIST GADOBUTROL 1 MMOL/ML IV SOLN COMPARISON:  CT chest dated May 03, 2021. FINDINGS: Alignment:  Physiologic. Vertebrae: Multiple thoracic vertebral body metastatic lesions. Pathologic fracture of the T11 vertebral body with up to 35% height loss. Thin anterior epidural tumor extension with associated enhancement (series 10, image 12). No other sites of epidural extension. Cord:  Normal signal and morphology.  No intrathecal enhancement. Paraspinal and other soft tissues: Large right upper lobe mass. 2.1 cm cystic lesion in the pancreatic tail. Disc levels: No significant disc bulge or herniation. Bulging of the T11 posterior vertebral body and overlying anterior epidural tumor extension results in moderate spinal canal stenosis with slight cord flattening. No neuroforaminal stenosis at any level. IMPRESSION: 1. Multiple thoracic vertebral body metastases again noted. Pathologic fracture of the T11 vertebral body  with up to 35% height loss. Bulging of the posterior vertebral body margin with overlying thin anterior epidural tumor extension results in moderate spinal canal stenosis at this level. No cord signal abnormality. 2. Right upper lobe lung cancer as seen on prior studies. 3. 2.1 cm cystic lesion in the pancreatic tail. Please see prior CT abdomen and pelvis report from 05/03/2021 for follow-up recommendations. Electronically Signed   By: WTitus DubinM.D.   On: 05/15/2021 17:29   CT ABDOMEN PELVIS W CONTRAST  Result Date: 05/03/2021 CLINICAL DATA:  76year old female with headache weakness, decreased P.O. Right side flank pain for 3 months. 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 EXAM: CT ABDOMEN AND PELVIS WITH CONTRAST TECHNIQUE: Multidetector CT imaging of the abdomen and pelvis was performed using the standard protocol following bolus administration of intravenous contrast. CONTRAST:  1033mOMNIPAQUE IOHEXOL 300 MG/ML  SOLN COMPARISON:  Chest CT 11/10/2017.  FINDINGS: Lower chest: Small layering pleural effusions are new since 2019, larger on the right with simple fluid density. No pericardial effusion. Cardiac size now at the upper limits of normal. Mild lung base atelectasis. Hepatobiliary: Coarse, heterogeneous liver enhancement is new since 2019. This has an infiltrative appearance, without a discrete liver mass or lesion. Gallbladder remains within normal limits. Pancreas: Small circumscribed simple fluid density cystic mass at the tail of the pancreas is 18 mm on series 5, image 27. Elsewhere the pancreatic parenchyma appears somewhat atrophied. No regional inflammation. Spleen: Negative. Adrenals/Urinary Tract: Normal adrenal glands. Symmetric renal enhancement and contrast excretion. Distended but otherwise unremarkable bladder. Stomach/Bowel: Redundant but decompressed large bowel in the abdomen and pelvis. There is retained stool in the right  colon. Normal appendix on coronal image 45. Cecum appears mildly distended with contrast. Terminal ileum is decompressed. No dilated small bowel. Negative stomach and duodenum. No free air. No abdominal free fluid. Vascular/Lymphatic: Aortoiliac calcified atherosclerosis. Major arterial structures in the abdomen and pelvis are patent. Portal venous system is patent. No lymphadenopathy. Reproductive: Small uterine fibroids, otherwise within normal limits. Other: There is trace free fluid in the cul-de-sac on series 5, image 73. Musculoskeletal: Lytic lesion with posterior expansion, retropulsion of tumor from the T11 vertebral body (sagittal image 58). Mild to moderate associated spinal stenosis there (series 5, image 19). Mild associated T11 loss of height, compression. Right side T8 vertebral body roughly 11 mm lytic lesion is also new since 2019 on sagittal image 57. Similar lytic lesions in the L4 and S1 bodies. Visible ribs appear intact. There is a 2 cm lytic lesion of the medial left iliac bone with probable early extraosseous extension on series 5, image 60. Elsewhere the pelvis appears intact. IMPRESSION: 1. Fairly widespread osseous metastatic disease, with lytic lesions in the spine and pelvis. Pathologic T11 compression fracture with epidural extension of tumor resulting in mild to moderate spinal stenosis. If there are myelopathic symptoms consider T11 cord compression. 2. Heterogeneous enhancement of the liver is new since 2019. The appearance is infiltrative and in light of #1 hepatic metastatic disease cannot be excluded although no discrete liver mass. Alternatively consider hepatitis or other intrinsic hepatocellular disease. 3. Small layering pleural effusions. Trace free fluid in the pelvis. 4. Small 1.8 cm cystic mass at the tail of the pancreas, with some background pancreatic atrophy. This is indeterminate and meets consensus criteria for re-imaging q6 months x4 to evaluate growth versus  stability. 5. Aortic Atherosclerosis (ICD10-I70.0). Electronically Signed   By: Genevie Ann M.D.   On: 05/03/2021 04:59   DG Chest Port 1 View  Result Date: 05/14/2021 CLINICAL DATA:  Pneumothorax after right lung biopsy EXAM: PORTABLE CHEST 1 VIEW COMPARISON:  04/18/2021, 05/14/2021 FINDINGS: Single frontal view of the chest demonstrates an unremarkable cardiac silhouette. Previously identified right upper lobe mass again seen. No new airspace disease. No evidence of pleural effusion. I do not see any pneumothorax after recent right lung biopsy. No acute bony abnormalities. IMPRESSION: 1. No complication after right lung biopsy. 2. Stable right lung mass. Electronically Signed   By: Randa Ngo M.D.   On: 05/14/2021 15:21   DG CHEST PORT 1 VIEW  Result Date: 05/08/2021 CLINICAL DATA:  Bronchoscopy yesterday, cough EXAM: PORTABLE CHEST 1 VIEW COMPARISON:  Chest radiograph 1 day prior, CT chest 05/03/2021 FINDINGS: The cardiomediastinal silhouette is stable. The approximally 4.6 cm mass in the right upper lobe is unchanged. Opacities surrounding this mass seen on the prior radiograph  are decreased in conspicuity. There is no new or worsening focal airspace disease. There is no pleural effusion. There is no pneumothorax. The bones are stable. IMPRESSION: Interval decreased in conspicuity of patchy opacities surrounding the right upper lobe mass compared to the study from 1 day prior. No new or worsening focal airspace disease. No pneumothorax. Electronically Signed   By: Valetta Mole M.D.   On: 05/08/2021 08:45   DG CHEST PORT 1 VIEW  Result Date: 05/07/2021 CLINICAL DATA:  Status post bronchoscopy EXAM: PORTABLE CHEST 1 VIEW COMPARISON:  05/02/2021 FINDINGS: New patchy alveolar densities seen in the right upper lung fields. Previously seen 4 cm nodular density in the right upper lung fields is obscured by the new infiltrate. Findings suggest possible pulmonary hemorrhage related to bronchoscopic biopsy.  Rest of the lung fields are unremarkable. There is no pleural effusion or pneumothorax. IMPRESSION: New alveolar infiltrate is seen in the right upper lung fields possibly suggesting pulmonary hemorrhage related to bronchoscopic biopsy. Electronically Signed   By: Elmer Picker M.D.   On: 05/07/2021 12:00   CT LUNG MASS BIOPSY  Result Date: 05/14/2021 CLINICAL DATA:  Posterior right upper lobe lung mass measuring up to approximately 3.8 cm by CT. Bronchoscopy on 05/07/2021 with sampling was not conclusive for malignancy. EXAM: CT GUIDED CORE BIOPSY OF RIGHT UPPER LOBE LUNG MASS ANESTHESIA/SEDATION: Moderate (conscious) sedation was employed during this procedure. A total of Versed 2.0 mg and Fentanyl 50 mcg was administered intravenously by radiology nursing. Moderate Sedation Time: 39 minutes. The patient's level of consciousness and vital signs were monitored continuously by radiology nursing throughout the procedure under my direct supervision. PROCEDURE: The procedure risks, benefits, and alternatives were explained to the patient. Questions regarding the procedure were encouraged and answered. The patient understands and consents to the procedure. A time-out was performed prior to initiating the procedure. Initial CT was performed through the chest in a prone position. The right posterior and posterolateral chest wall was prepped with chlorhexidine in a sterile fashion, and a sterile drape was applied covering the operative field. A sterile gown and sterile gloves were used for the procedure. Local anesthesia was provided with 1% Lidocaine. Under CT guidance, past a 17 gauge trocar needle was advanced to the level of a posterior right upper lobe lung mass. After confirming needle tip position, an 18 gauge core biopsy sample was obtained. Additional CT was performed. Aspiration of air was then performed in the posterior right pleural space through the outer needle utilizing a 3 way stopcock and syringe.  Additional CT was performed. COMPLICATIONS: Small right posterior pneumothorax treated with needle aspiration. FINDINGS: Initial imaging again demonstrates a solid and lobulated oval-shaped mass in the posterior right upper lobe that abuts the major fissure and posterolateral pleural surface. There was significant difficulty in approaching the mass percutaneously due to overlying ribs and the scapula. Eventually a window was available along the lateral aspect of the mass for biopsy which successfully yielded a single intact core biopsy sample. After the first biopsy sample was obtained, there was development of a small posterior pneumothorax. This was treated with needle aspiration resulting in diminished size of the pneumothorax. Due to pneumothorax development, further core biopsy samples were not attempted to be attained. IMPRESSION: CT-guided core biopsy of right upper lobe lung mass. Approach to the mass was difficult due to intervening scapula and ribs. After a single core biopsy was obtained, there was development of a small pneumothorax which was treated with needle aspiration. Electronically  Signed   By: Aletta Edouard M.D.   On: 05/14/2021 16:36     Future Appointments  Date Time Provider [place]  05/22/2021 12:30 PM [facility] 4 [facility] None  05/23/2021 12:45 PM [facility] 3 [facility] None  05/24/2021 12:00 PM [facility] 3 [facility] None  05/27/2021  1:00 PM [facility] 4 [facility] None  05/28/2021  1:00 PM [facility] 4 [facility] None  05/29/2021 12:45 PM [facility] 4 [facility] None  05/30/2021  2:15 PM [facility] 4 [facility] None  05/31/2021  2:15 PM [facility] 4 [facility] None  06/03/2021  1:30 PM [facility] 4 [facility] None  06/04/2021  1:30 PM [facility] 4 [facility] None  06/27/2021  8:50 AM Plotnikov, Evie Lacks, MD [facility] None      LOS: 18 days   Addendum  I have seen the patient,  examined her. I agree with the assessment and and plan and have edited the notes.   I spoke with pathologist Dr. 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  I reviewed the biopsy findings with patient through online interpreter today (NP Erasmo Downer spoke with her son on the phone before my visit), pt has finally agreed with palliative RT to her T11 bone mets, plan to start tomorrow. I recommend her to be transferred to [facility] for her convenience of treatment, but she started questioning if the transfer is "for your convenience or my convenience", I explained to her the process, and she finally said "talk to my son and we will make a decision". Per her request, I have asked Dr. Melina Copa to send her biopsy sample to [place] for second opinion. Her biosy was negative for ER, I requested PR/HER2, PD-L1 and FO NGS to see if she is a candidate for immunotherapy or targeted therapy.  Patient does not want to discuss systemic treatment at this point, it seems any decision will take her quite a bit of time to think through. I spoke with Dr. Hilma Favors about her today and she will f/u with her tomorrow for pain management and discharge planning. I also spoke with Dr. Ellene Route who does not recommend Kyphoplasty for her T11 fracture. I will set up her f/u with me in office in 3 weeks.   Truitt Merle  05/21/2021

## 2021-05-21 NOTE — Progress Notes (Signed)
The patient declined to come for radiation yesterday and today since her pathology report had not been finalized. I have communicated with Dr. Melina Copa in pathology and she believes that the colon, lung, and prior breast specimens have similar features and that this is all a metastatic process of her original breast cancer. Dr. Lisbeth Renshaw has been comfortable with proceeding with treatment and the patient and her son are willing now to proceed with radiation starting tomorrow. We will arrange with her floor nurse for [facility] transportation for radiation tomorrow. They are also aware that the tissue will also be sent to [facility] after our pathology team has concluded their testing, but that to wait for that assessment which would likely take several weeks could compromise her risks neurologically even further and that she could become paralyzed in that time. They are in agreement still requesting outside review but do not wish to delay radiation any further.     Carola Rhine, PAC

## 2021-05-22 ENCOUNTER — Encounter: Payer: Self-pay | Admitting: Internal Medicine

## 2021-05-22 ENCOUNTER — Ambulatory Visit
Admit: 2021-05-22 | Discharge: 2021-05-22 | Disposition: A | Discharge status: Home / Self Care | Payer: Medicare HMO | Attending: Radiation Oncology | Admitting: Radiation Oncology

## 2021-05-22 ENCOUNTER — Ambulatory Visit: Payer: Medicare HMO

## 2021-05-22 ENCOUNTER — Telehealth: Payer: Self-pay | Admitting: Hematology

## 2021-05-22 DIAGNOSIS — R918 Other nonspecific abnormal finding of lung field: Secondary | ICD-10-CM | POA: Diagnosis not present

## 2021-05-22 MED ORDER — SENNA 8.6 MG PO TABS: 1.0000 | ORAL_TABLET | Freq: Every day | ORAL | Status: DC | PRN

## 2021-05-22 NOTE — Telephone Encounter (Signed)
Scheduled appt per 1/10 staff msg from Dr. Burr Medico. Used interpreter services to speak to pt. She is aware of appt date and time. I did offer pt earlier day, however pt said she would not be able to come in until after February 10th. Pt is aware of appt date and time.

## 2021-05-22 NOTE — Progress Notes (Signed)
Pt to be transported via [facility] back to [facility] per orders.  Report given to Bev on [address].  Pt has no belongings at [facility].  Will give 1700 scheduled meds before she leaves. IV is patent and intact.

## 2021-05-22 NOTE — Progress Notes (Signed)
PROGRESS NOTE    Robin Estrada  AVW:[id number] DOB: 05-09-1946 DOA: 05/02/2021 PCP: [name], MD   Brief Narrative:  76 year old woman, Russian-speaking, PMH breast cancer, admitted 12/22 with history of anorexia and weight loss, found to have anemia, cystic lesion of the pancreas, infiltrative disease in the liver and right upper lobe mass.  Seen by GI, underwent EGD showing gastritis and ulcer, seen by palliative medicine with goals to remain full code, no chemotherapy, but would consider radiation.  Underwent bronchoscopy 12/27, multiple areas biopsied but pathology unrevealing.  Underwent colonoscopy 12/29.  Several polyps removed.  Status post CT-guided biopsy by interventional radiology 1/3.  Plan for MRI of the back to further assess T11 pathologic fracture to rule out epidural extension.  Repeat lab work in the morning, likely home in the next 48 hours, doing well with PT.  Assessment & Plan:   Principal Problem:   Lung mass Active Problems:   Essential hypertension   History of breast cancer   GI bleed   Chronic blood loss anemia   Leukocytosis   Hypokalemia   Thrombocytosis   Hypoalbuminemia   Pathologic compression fracture of thoracic vertebra (HCC)   Cystic mass of pancreas   Acute on chronic diastolic CHF (congestive heart failure) (HCC)   Iron deficiency anemia   Acute gastric ulcer without hemorrhage or perforation   Wheezing   Benign neoplasm of cecum   Benign neoplasm of sigmoid colon   Therapeutic opioid induced constipation   Osseous metastasis (HCC)   Other cirrhosis of liver (HCC)   Aortic atherosclerosis (HCC)   Emphysema of lung (HCC)  Lung mass/osseous metastasis/pathologic T11 compression fracture- (present on admission): per CT: Multiple lytic osseous bone metastases. Most notably a dorsally expansile lesion in the T11 vertebral body which causes moderate to severe spinal canal stenosis.  Pain fairly well controlled on Duragesic and  Lortab.  No symptoms of myelopathy or cord compression. MRI thoracic spine shows 35% height loss on T11.  Patient seen by neurosurgery/Dr. Ellene Route who recommended radiation to the area and no other intervention.  Radiation oncology consulted, patient had simulation done Friday.   Oncology also saw patient.  Per GI note 05/20/2021, they spoke to pathologist and prelim report says that she likely has metastatic breast cancer.  Per radiation oncology APP, final path report is still pending.  Radiation oncology and oncology both talked to the patient yesterday and they highly recommended that she starts radiation now otherwise she will be at risk of neurological deterioration, spinal canal stenosis and patient finally agreed.  She is going to start her radiation today on daily basis except weekends for next 2 weeks.  I discussed with this patient this morning using video interpreter about possibly transferring her to [facility] long hospital so she does not have to be transported every day via ambulance and she can get her radiation there.  She agreed to that.  However, her son called me 2 hours later stating that patient probably was confused and he wanted me to seek more information about how she will be transported to radiation daily if she were to go to [facility].  He also informed me that she was concerned that she also needs her anemia and GI bleed to be taken care of.  He was reassured that patient's anemia and GI bleed are all stable and that patient will be seen by hospitalist over at [facility] long as well on daily basis and if needed, GI can be consulted there.  He was provided that information as well after I sought more information from radiation oncology APP.  He is agreeable with the transfer.  Patient will remain at [facility] after radiation today.   Hypertension: Fairly controlled.  Continue atenolol.  Acute on chronic diastolic CHF: Does not appear volume overloaded.  Likely euvolemic.  Lasix stopped.   Resume outpatient diuretic on discharge.  Hypokalemia: Resolved   Emphysema of lung (HCC) --asymptomatic   Other cirrhosis of liver (HCC) --seen on CT, appears stable, consider outpatient follow-up   Acute gastric ulcer without hemorrhage or perforation/chronic blood loss anemia/iron deficiency anemia- (present on admission) --EGD 12/24: 4 cm HH.  Clean-based, non-bleeding GU, erosive gastritis. Biopsy showed no significant abnormality of small bowel, negative H pylori, no inflammation/dysplasia or malignancy.  Continue PPI.  GI on board. No aspirin, ibuprofen, naproxen, or other non-steroidal anti-inflammatory drugs for 2 weeks after polyp removal.  She received IV iron here.  Will start on oral iron at discharge.  Hemoglobin dropped to 7.0 and she received 1 unit of PRBC transfusion on 05/16/2021.  Hemoglobin improved and stable since then.  Patient had requested IV iron however she kept pushing it and she is not sure when she would like to get IV iron.  Cystic mass of pancreas- (present on admission) -- CA 19-9 level normal.  On board and they plan to perform MRI/MRCP to evaluate pancreas at some point in time but not urgent.   DVT prophylaxis: SCDs Start: 05/03/21 3825   Code Status: Full Code  Family Communication:  None present at bedside.  Plan of care discussed with patient And her son in length.  Status is: Inpatient  Remains inpatient appropriate because: Awaiting pathology results  Estimated body mass index is 24.62 kg/m as calculated from the following:   Height as of this encounter: 5\' 5"  (1.651 m).   Weight as of this encounter: 67.1 kg.  Nutritional Assessment: Body mass index is 24.62 kg/m.Marland Kitchen Seen by dietician.  I agree with the assessment and plan as outlined below: Nutrition Status: Nutrition Problem: Inadequate oral intake Etiology: poor appetite Signs/Symptoms: per patient/family report Interventions: Boost Plus, MVI  Skin Assessment: I have examined the  patient's skin and I agree with the wound assessment as performed by the wound care RN as outlined below:    Consultants:  GI Neurosurgery  Procedures:  None  Antimicrobials:  Anti-infectives (From admission, onward)    None          Subjective:  Patient seen and examined earlier today using video interpreter.  She had no complaints.  Objective: Vitals:   05/21/21 0757 05/21/21 1957 05/22/21 0459 05/22/21 0835  BP: (!) 155/52 (!) 147/67 (!) 150/62 (!) 157/62  Pulse: 73 75 69 69  Resp: 18 18 18 17   Temp: 97.9 F (36.6 C) 97.8 F (36.6 C) 98.7 F (37.1 C) 98.1 F (36.7 C)  TempSrc: Oral Oral Oral   SpO2: 95% 100% 95% 97%  Weight:      Height:        Intake/Output Summary (Last 24 hours) at 05/22/2021 1325 Last data filed at 05/22/2021 1020 Gross per 24 hour  Intake 720 ml  Output --  Net 720 ml    Filed Weights   05/04/21 0945 05/07/21 0815 05/09/21 1117  Weight: 67.1 kg 67.1 kg 67.1 kg    Examination:  General exam: Appears calm and comfortable  Respiratory system: Clear to auscultation. Respiratory effort normal. Cardiovascular system: S1 & S2 heard, RRR. No  JVD, murmurs, rubs, gallops or clicks. No pedal edema. Gastrointestinal system: Abdomen is nondistended, soft and nontender. No organomegaly or masses felt. Normal bowel sounds heard. Central nervous system: Alert and oriented. No focal neurological deficits. Extremities: Symmetric 5 x 5 power. Skin: No rashes, lesions or ulcers.  Psychiatry: Judgement and insight appear normal. Mood & affect appropriate.    Data Reviewed: I have personally reviewed following labs and imaging studies  CBC: Recent Labs  Lab 05/16/21 0640 05/16/21 1937 05/17/21 0515 05/18/21 0116 05/20/21 0920  WBC 11.2*  --  12.9* 11.8* 11.2*  NEUTROABS  --   --   --  9.4* 9.1*  HGB 7.0* 9.2* 8.4* 8.4* 8.7*  HCT 23.4* 29.2* 27.4* 26.6* 28.4*  MCV 73.1*  --  74.9* 73.9* 75.3*  PLT 510*  --  526* 546* 546*    Basic  Metabolic Panel: Recent Labs  Lab 05/16/21 0640 05/17/21 0515 05/20/21 0920  NA 132* 133* 133*  K 3.6 3.7 3.6  CL 96* 95* 94*  CO2 26 27 25   GLUCOSE 120* 128* 141*  BUN 9 8 8   CREATININE 0.75 0.73 0.81  CALCIUM 8.2* 8.3* 9.0    GFR: Estimated Creatinine Clearance: 53.2 mL/min (by C-G formula based on SCr of 0.81 mg/dL). Liver Function Tests: Recent Labs  Lab 05/20/21 0920  AST 13*  ALT 13  ALKPHOS 97  BILITOT 0.5  PROT 5.9*  ALBUMIN 2.3*    No results for input(s): LIPASE, AMYLASE in the last 168 hours. No results for input(s): AMMONIA in the last 168 hours. Coagulation Profile: No results for input(s): INR, PROTIME in the last 168 hours. Cardiac Enzymes: No results for input(s): CKTOTAL, CKMB, CKMBINDEX, TROPONINI in the last 168 hours. BNP (last 3 results) No results for input(s): PROBNP in the last 8760 hours. HbA1C: No results for input(s): HGBA1C in the last 72 hours. CBG: No results for input(s): GLUCAP in the last 168 hours. Lipid Profile: No results for input(s): CHOL, HDL, LDLCALC, TRIG, CHOLHDL, LDLDIRECT in the last 72 hours. Thyroid Function Tests: No results for input(s): TSH, T4TOTAL, FREET4, T3FREE, THYROIDAB in the last 72 hours. Anemia Panel: No results for input(s): VITAMINB12, FOLATE, FERRITIN, TIBC, IRON, RETICCTPCT in the last 72 hours. Sepsis Labs: No results for input(s): PROCALCITON, LATICACIDVEN in the last 168 hours.  No results found for this or any previous visit (from the past 240 hour(s)).    Radiology Studies: No results found.  Scheduled Meds:  atenolol  100 mg Oral BID   fentaNYL  1 patch Transdermal Q72H   lactose free nutrition  237 mL Oral TID WC   lidocaine  1 patch Transdermal Q24H   multivitamin with minerals  1 tablet Oral Daily   pantoprazole  40 mg Oral BID AC   polyethylene glycol  17 g Oral Daily   sodium chloride flush  3 mL Intravenous Q12H   Continuous Infusions:   LOS: 19 days   Time spent: 40  minutes   Darliss Cheney, MD [facility]  05/22/2021, 1:25 PM  Please page via [place] and do not message via secure chat for anything urgent. Secure chat can be used for anything non urgent.  How to contact the [facility] Attending or Consulting provider [place] or covering provider during after hours [place], for this patient?  Check the care team in [facility] and look for a) attending/consulting TR[facility] provider listed and b) the [facility] team listed. Page or secure chat [phone]. Log into [email or web address] and  use [place]'s universal password to access. If you do not have the password, please contact the hospital operator. Locate the [facility] provider you are looking for under [facility] and page to a number that you can be directly reached. If you still have difficulty reaching the provider, please page the [facility] (Director on Call) for the Hospitalists listed on amion for assistance.

## 2021-05-22 NOTE — Progress Notes (Signed)
Physical Therapy Treatment Patient Details Name: Robin Estrada Date MRN: [id number] DOB: February 24, 1946 Today's Date: 05/22/2021   History of Present Illness 76 y.o. female presents to [facility] hospital on 05/02/2021 with complaints of weakness, DOE. 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 s/p bronchoscopy 12/27 with multiple areas biopsied however pathology came back unremarkable. Current plan is for ultrasound-guided biopsy by interventional radiology on Tuesday, 1/3. 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    PT Comments    Pt admitted with above diagnosis. Pt was able to exercise in bed but declines ambulation this am. Agreed to get OOB around lunchtime.  Pt self limiting but does not appear to have lost too much strength while here.  Pt currently with functional limitations due to balance and endurance deficits. Pt will benefit from skilled PT to increase their independence and safety with mobility to allow discharge to the venue listed below.      Recommendations for follow up therapy are one component of a multi-disciplinary discharge planning process, led by the attending physician.  Recommendations may be updated based on patient status, additional functional criteria and insurance authorization.  Follow Up Recommendations  Outpatient PT     Assistance Recommended at Discharge PRN  Patient can return home with the following     Equipment Recommendations  None recommended by PT    Recommendations for Other Services       Precautions / Restrictions Precautions Precautions: Fall Precaution Comments: anemic Restrictions Weight Bearing Restrictions: No     Mobility  Bed Mobility   Bed Mobility: Rolling Rolling: Min guard         General bed mobility comments: Assisted pt to gain more  comfort in bed.  Refused OOB - only agreed to review exercises and assisted pt in ordering breakfast.    Transfers                        Ambulation/Gait                   Stairs             Wheelchair Mobility    Modified Rankin (Stroke Patients Only)       Balance                                            Cognition Arousal/Alertness: Awake/alert Behavior During Therapy: WFL for tasks assessed/performed Overall Cognitive Status: Within Functional Limits for tasks assessed                                          Exercises General Exercises - Lower Extremity Ankle Circles/Pumps: AROM;Both;5 reps;Seated Quad Sets: AROM;Both;5 reps;Supine Heel Slides: AAROM;5 reps;Supine;Both    General Comments        Pertinent Vitals/Pain Pain Assessment: No/denies pain    Home Living                          Prior Function            PT Goals (current goals can now be  found in the care plan section) Acute Rehab PT Goals Patient Stated Goal: patient goal seems to be to discharge to assisted living, or return to her prior level of function Progress towards PT goals: Progressing toward goals    Frequency    Min 3X/week      PT Plan Current plan remains appropriate    Co-evaluation              AM-PAC PT "6 Clicks" Mobility   Outcome Measure  Help needed turning from your back to your side while in a flat bed without using bedrails?: None Help needed moving from lying on your back to sitting on the side of a flat bed without using bedrails?: None Help needed moving to and from a bed to a chair (including a wheelchair)?: None Help needed standing up from a chair using your arms (e.g., wheelchair or bedside chair)?: None Help needed to walk in hospital room?: A Little Help needed climbing 3-5 steps with a railing? : A Little 6 Click Score: 22    End of Session   Activity Tolerance:  Patient limited by fatigue Patient left: in bed;with call bell/phone within reach Nurse Communication: Mobility status (Pt asking about her meds and when nurse was bringing them) PT Visit Diagnosis: Other abnormalities of gait and mobility (R26.89);Muscle weakness (generalized) (M62.81)     Time: 0920-0930 PT Time Calculation (min) (ACUTE ONLY): 10 min  Charges:  $Therapeutic Exercise: 8-22 mins                     [name] M,PT Acute Rehab Services [phone] (pager)    [name] 05/22/2021, 10:28 AM

## 2021-05-22 NOTE — Progress Notes (Signed)
[facility] calls to give a quick report on pt coming to 1610.  Pt arrived with [facility] to [facility] from [facility]. Upon entering, nurse takes the interpreter to communicate with pt while transport is already speaking with son on cell phone via speaker phone for translation with the patient.  After translating with son on the phone, pt states she is very unhappy and felt like she was treated like an "item".  Pt feels like she was abruptly transferred here to [facility] without any warning, leaving her personal belongings at [facility].  Pt wants to go back to [facility] to get her belongings and doesn't want to come to [facility] until tomorrow.  [place], Nurse at [facility] did not get report from nurse at [facility] trying to put the pieces together.  Complaints from pt and son have been shared with Doristine Bosworth and nurse manager, Aldean Baker to resolve problems.

## 2021-05-23 ENCOUNTER — Ambulatory Visit
Admit: 2021-05-23 | Discharge: 2021-05-23 | Disposition: A | Discharge status: Home / Self Care | Payer: Medicare HMO | Attending: Radiation Oncology | Admitting: Radiation Oncology

## 2021-05-23 ENCOUNTER — Ambulatory Visit: Payer: Medicare HMO

## 2021-05-23 DIAGNOSIS — C50919 Malignant neoplasm of unspecified site of unspecified female breast: Secondary | ICD-10-CM | POA: Diagnosis present

## 2021-05-23 DIAGNOSIS — C785 Secondary malignant neoplasm of large intestine and rectum: Secondary | ICD-10-CM | POA: Diagnosis not present

## 2021-05-23 DIAGNOSIS — K253 Acute gastric ulcer without hemorrhage or perforation: Secondary | ICD-10-CM | POA: Diagnosis not present

## 2021-05-23 DIAGNOSIS — R918 Other nonspecific abnormal finding of lung field: Secondary | ICD-10-CM | POA: Diagnosis not present

## 2021-05-23 LAB — CBC WITH DIFFERENTIAL/PLATELET
Abs Immature Granulocytes: 0.05 10*3/uL (ref 0.00–0.07)
Basophils Absolute: 0.1 10*3/uL (ref 0.0–0.1)
Basophils Relative: 1 %
Eosinophils Absolute: 0.3 10*3/uL (ref 0.0–0.5)
Eosinophils Relative: 3 %
HCT: 29.8 % — ABNORMAL LOW (ref 36.0–46.0)
Hemoglobin: 9.2 g/dL — ABNORMAL LOW (ref 12.0–15.0)
Immature Granulocytes: 1 %
Lymphocytes Relative: 8 %
Lymphs Abs: 0.8 10*3/uL (ref 0.7–4.0)
MCH: 23.2 pg — ABNORMAL LOW (ref 26.0–34.0)
MCHC: 30.9 g/dL (ref 30.0–36.0)
MCV: 75.1 fL — ABNORMAL LOW (ref 80.0–100.0)
Monocytes Absolute: 0.8 10*3/uL (ref 0.1–1.0)
Monocytes Relative: 8 %
Neutro Abs: 8.6 10*3/uL — ABNORMAL HIGH (ref 1.7–7.7)
Neutrophils Relative %: 79 %
Platelets: 522 10*3/uL — ABNORMAL HIGH (ref 150–400)
RBC: 3.97 MIL/uL (ref 3.87–5.11)
RDW: 20 % — ABNORMAL HIGH (ref 11.5–15.5)
WBC: 10.6 10*3/uL — ABNORMAL HIGH (ref 4.0–10.5)
nRBC: 0 % (ref 0.0–0.2)

## 2021-05-23 MED ORDER — SODIUM CHLORIDE 0.9 % IV SOLN
250.0000 mg | Freq: Every day | INTRAVENOUS | Status: AC
  Administered 2021-05-23 – 2021-05-24 (×2): 250 mg via INTRAVENOUS
  Filled 2021-05-23 (×2): qty 20

## 2021-05-23 NOTE — Progress Notes (Signed)
[facility] to transport patient to [place]. Patient placement aware of the transfer order for this patient but as  of this time, no bed available. Charge is also aware of the transfer order .

## 2021-05-23 NOTE — Progress Notes (Signed)
PROGRESS NOTE    Robin Estrada  IWP:[id number] DOB: 10/20/1945 DOA: 05/02/2021 PCP: [name], MD   Brief Narrative:  76 year old woman, Russian-speaking, PMH breast cancer, admitted 12/22 with history of anorexia and weight loss, found to have anemia, cystic lesion of the pancreas, infiltrative disease in the liver and right upper lobe mass.  Seen by GI, underwent EGD showing gastritis and ulcer, seen by palliative medicine with goals to remain full code, no chemotherapy, but would consider radiation.  Underwent bronchoscopy 12/27, multiple areas biopsied but pathology unrevealing.  Underwent colonoscopy 12/29.  Several polyps removed.  Status post CT-guided biopsy by interventional radiology 1/3.  Plan for MRI of the back to further assess T11 pathologic fracture to rule out epidural extension.  Repeat lab work in the morning, likely home in the next 48 hours, doing well with PT.  Assessment & Plan:   Principal Problem:   Lung mass Active Problems:   Essential hypertension   History of breast cancer   GI bleed   Chronic blood loss anemia   Leukocytosis   Hypokalemia   Thrombocytosis   Hypoalbuminemia   Pathologic compression fracture of thoracic vertebra (HCC)   Cystic mass of pancreas   Acute on chronic diastolic CHF (congestive heart failure) (HCC)   Iron deficiency anemia   Acute gastric ulcer without hemorrhage or perforation   Wheezing   Benign neoplasm of cecum   Benign neoplasm of sigmoid colon   Therapeutic opioid induced constipation   Osseous metastasis (HCC)   Other cirrhosis of liver (HCC)   Aortic atherosclerosis (HCC)   Emphysema of lung (HCC)  Lung mass/osseous metastasis/pathologic T11 compression fracture- (present on admission): per CT: Multiple lytic osseous bone metastases. Most notably a dorsally expansile lesion in the T11 vertebral body which causes moderate to severe spinal canal stenosis.  Pain fairly well controlled on Duragesic and  Lortab.  No symptoms of myelopathy or cord compression. MRI thoracic spine shows 35% height loss on T11.  Patient seen by neurosurgery/Dr. Ellene Route who recommended radiation to the area and no other intervention.  Radiation oncology consulted, patient had simulation done Friday.   Oncology also saw patient.  It looks like patient's lung biopsy pathology is back however I am unable to interpret it.  Will defer to oncology and radiation oncology for that.  Patient started radiation on 05/22/2021.  After getting proper consent from her, patient was transferred to [facility] long however patient felt that she was not mentally prepared and her family was upset to, after detailed discussions with multiple unit directors, patient demanded to be returned to [facility] overnight on 05/22/2021 so she can expand the night here and this morning, once again having the nurse present at her bedside, I discussed with her while her son was on the video call and they both agreed that now they want to go to [facility].  Transfer order has been placed however per oncology floor director at [facility], there is no bed available at this point in time and they had discussed with the son yesterday that these logistical issues may happen anytime and there would be no guarantee of bed availability at [employer].  Further management per radiation oncology as well as oncology.  Hypertension: Fairly controlled.  Continue atenolol.  Acute on chronic diastolic CHF: Does not appear volume overloaded.  Likely euvolemic.  Lasix stopped.  Resume outpatient diuretic on discharge.  Hypokalemia: Resolved   Emphysema of lung (HCC) --asymptomatic   Other cirrhosis of  liver (HCC) --seen on CT, appears stable, consider outpatient follow-up   Acute gastric ulcer without hemorrhage or perforation/chronic blood loss anemia/iron deficiency anemia- (present on admission) --EGD 12/24: 4 cm HH.  Clean-based, non-bleeding GU, erosive gastritis. Biopsy  showed no significant abnormality of small bowel, negative H pylori, no inflammation/dysplasia or malignancy.  Continue PPI.  GI on board. No aspirin, ibuprofen, naproxen, or other non-steroidal anti-inflammatory drugs for 2 weeks after polyp removal.  She received IV iron here.  Will start on oral iron at discharge.  Hemoglobin dropped to 7.0 and she received 1 unit of PRBC transfusion on 05/16/2021.  Hemoglobin checked today is much improved around 9 today.  She now wants IV iron which is ordered.  Cystic mass of pancreas- (present on admission) -- CA 19-9 level normal.  On board and they plan to perform MRI/MRCP to evaluate pancreas at some point in time but not urgent.   DVT prophylaxis: SCDs Start: 05/03/21 2778   Code Status: Full Code  Family Communication:  None present at bedside.  Plan of care discussed with patient And her son in length.  Status is: Inpatient  Remains inpatient appropriate because: Awaiting pathology results  Estimated body mass index is 24.62 kg/m as calculated from the following:   Height as of this encounter: 5\' 5"  (1.651 m).   Weight as of this encounter: 67.1 kg.  Nutritional Assessment: Body mass index is 24.62 kg/m.Marland Kitchen Seen by dietician.  I agree with the assessment and plan as outlined below: Nutrition Status: Nutrition Problem: Inadequate oral intake Etiology: poor appetite Signs/Symptoms: per patient/family report Interventions: Boost Plus, MVI  Skin Assessment: I have examined the patient's skin and I agree with the wound assessment as performed by the wound care RN as outlined below:    Consultants:  GI Neurosurgery  Procedures:  None  Antimicrobials:  Anti-infectives (From admission, onward)    None          Subjective:  Patient seen and examined.  Talked to her son over the video call in patient's room.  Patient has no complaint.  She wanted to be transferred to [facility].  Also wanted IV iron to be given as  well.  Objective: Vitals:   05/22/21 1947 05/23/21 0110 05/23/21 0504 05/23/21 0948  BP: (!) 151/61 132/62 (!) 154/71 (!) 164/64  Pulse: 79 68 75 74  Resp: 20 18 18 18   Temp: 98.2 F (36.8 C) 98.3 F (36.8 C) 98.6 F (37 C) 98.1 F (36.7 C)  TempSrc: Oral Oral Oral Oral  SpO2: 98% 97% 95% 95%  Weight:      Height:        Intake/Output Summary (Last 24 hours) at 05/23/2021 1426 Last data filed at 05/23/2021 1009 Gross per 24 hour  Intake 120 ml  Output --  Net 120 ml    Filed Weights   05/04/21 0945 05/07/21 0815 05/09/21 1117  Weight: 67.1 kg 67.1 kg 67.1 kg    Examination:  General exam: Appears calm and comfortable  Respiratory system: Clear to auscultation. Respiratory effort normal. Cardiovascular system: S1 & S2 heard, RRR. No JVD, murmurs, rubs, gallops or clicks. No pedal edema. Gastrointestinal system: Abdomen is nondistended, soft and nontender. No organomegaly or masses felt. Normal bowel sounds heard. Central nervous system: Alert and oriented. No focal neurological deficits. Extremities: Symmetric 5 x 5 power. Skin: No rashes, lesions or ulcers.   Data Reviewed: I have personally reviewed following labs and imaging studies  CBC: Recent Labs  Lab 05/16/21 1937 05/17/21 0515 05/18/21 0116 05/20/21 0920 05/23/21 0916  WBC  --  12.9* 11.8* 11.2* 10.6*  NEUTROABS  --   --  9.4* 9.1* 8.6*  HGB 9.2* 8.4* 8.4* 8.7* 9.2*  HCT 29.2* 27.4* 26.6* 28.4* 29.8*  MCV  --  74.9* 73.9* 75.3* 75.1*  PLT  --  526* 546* 546* 522*    Basic Metabolic Panel: Recent Labs  Lab 05/17/21 0515 05/20/21 0920  NA 133* 133*  K 3.7 3.6  CL 95* 94*  CO2 27 25  GLUCOSE 128* 141*  BUN 8 8  CREATININE 0.73 0.81  CALCIUM 8.3* 9.0    GFR: Estimated Creatinine Clearance: 53.2 mL/min (by C-G formula based on SCr of 0.81 mg/dL). Liver Function Tests: Recent Labs  Lab 05/20/21 0920  AST 13*  ALT 13  ALKPHOS 97  BILITOT 0.5  PROT 5.9*  ALBUMIN 2.3*    No results  for input(s): LIPASE, AMYLASE in the last 168 hours. No results for input(s): AMMONIA in the last 168 hours. Coagulation Profile: No results for input(s): INR, PROTIME in the last 168 hours. Cardiac Enzymes: No results for input(s): CKTOTAL, CKMB, CKMBINDEX, TROPONINI in the last 168 hours. BNP (last 3 results) No results for input(s): PROBNP in the last 8760 hours. HbA1C: No results for input(s): HGBA1C in the last 72 hours. CBG: No results for input(s): GLUCAP in the last 168 hours. Lipid Profile: No results for input(s): CHOL, HDL, LDLCALC, TRIG, CHOLHDL, LDLDIRECT in the last 72 hours. Thyroid Function Tests: No results for input(s): TSH, T4TOTAL, FREET4, T3FREE, THYROIDAB in the last 72 hours. Anemia Panel: No results for input(s): VITAMINB12, FOLATE, FERRITIN, TIBC, IRON, RETICCTPCT in the last 72 hours. Sepsis Labs: No results for input(s): PROCALCITON, LATICACIDVEN in the last 168 hours.  No results found for this or any previous visit (from the past 240 hour(s)).    Radiology Studies: No results found.  Scheduled Meds:  atenolol  100 mg Oral BID   fentaNYL  1 patch Transdermal Q72H   lactose free nutrition  237 mL Oral TID WC   lidocaine  1 patch Transdermal Q24H   multivitamin with minerals  1 tablet Oral Daily   pantoprazole  40 mg Oral BID AC   polyethylene glycol  17 g Oral Daily   sodium chloride flush  3 mL Intravenous Q12H   Continuous Infusions:  ferric gluconate (FERRLECIT) IVPB 250 mg (05/23/21 1049)     LOS: 20 days   Time spent: 32 minutes   Darliss Cheney, MD [facility]  05/23/2021, 2:26 PM  Please page via [place] and do not message via secure chat for anything urgent. Secure chat can be used for anything non urgent.  How to contact the [facility] Attending or Consulting provider [place] or covering provider during after hours [place], for this patient?  Check the care team in [facility] and look for a) attending/consulting TR[facility] provider listed and b)  the [facility] team listed. Page or secure chat [phone]. Log into [email or web address] and use [place]'s universal password to access. If you do not have the password, please contact the hospital operator. Locate the [facility] provider you are looking for under [facility] and page to a number that you can be directly reached. If you still have difficulty reaching the provider, please page the [facility] (Director on Call) for the Hospitalists listed on amion for assistance.

## 2021-05-23 NOTE — Progress Notes (Signed)
Occupational Therapy Treatment Patient Details Name: Robin Estrada MRN: [id number] DOB: 16-Jun-1945 Today's Date: 05/23/2021   History of present illness 76 y.o. female presents to [facility] hospital on 05/02/2021 with complaints of weakness, DOE. 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 s/p bronchoscopy 12/27 with multiple areas biopsied however pathology came back unremarkable. Current plan is for ultrasound-guided biopsy by interventional radiology on Tuesday, 1/3. 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   OT comments  Pt ambulating somewhat slowly, but without LOB or reaching for stability to bathroom for toileting and to sink for grooming. Pt reports routinely walking to bathroom without assistance of nursing staff.  Assisted pt with ordering food using house phone. Pt declined any further ambulation or ADL, stated she is waiting "to go to another hospital."    Recommendations for follow up therapy are one component of a multi-disciplinary discharge planning process, led by the attending physician.  Recommendations may be updated based on patient status, additional functional criteria and insurance authorization.    Follow Up Recommendations  Home health OT    Assistance Recommended at Discharge Set up Supervision/Assistance  Patient can return home with the following  A little help with walking and/or transfers;A little help with bathing/dressing/bathroom;Assist for transportation;Help with stairs or ramp for entrance;Assistance with cooking/housework   Equipment Recommendations  Tub/shower seat    Recommendations for Other Services      Precautions / Restrictions Precautions Precautions: Fall       Mobility Bed Mobility Overal bed mobility: Modified Independent             General  bed mobility comments: HOB up    Transfers Overall transfer level: Modified independent Equipment used: None                     Balance Overall balance assessment: Mild deficits observed, not formally tested                                         ADL either performed or assessed with clinical judgement   ADL Overall ADL's : Needs assistance/impaired     Grooming: Modified independent;Standing           Upper Body Dressing : Set up;Sitting   Lower Body Dressing: Modified independent;Sit to/from stand   Toilet Transfer: Modified Independent;Ambulation;Regular Toilet   Toileting- Clothing Manipulation and Hygiene: Modified independent;Sit to/from stand       Functional mobility during ADLs: Modified independent      Extremity/Trunk Assessment              Vision       Perception     Praxis      Cognition Arousal/Alertness: Awake/alert Behavior During Therapy: WFL for tasks assessed/performed Overall Cognitive Status: Within Functional Limits for tasks assessed                                 General Comments: needing assist to place lunch order          Exercises     Shoulder Instructions       General Comments      Pertinent Vitals/ Pain  Pain Assessment: No/denies pain  Home Living                                          Prior Functioning/Environment              Frequency  Min 2X/week        Progress Toward Goals  OT Goals(current goals can now be found in the care plan section)  Progress towards OT goals: Progressing toward goals  Acute Rehab OT Goals OT Goal Formulation: With patient Time For Goal Achievement: 06/04/21 Potential to Achieve Goals: Good  Plan Discharge plan remains appropriate;Frequency remains appropriate    Co-evaluation                 AM-PAC OT "6 Clicks" Daily Activity     Outcome Measure   Help from another person  eating meals?: None Help from another person taking care of personal grooming?: None Help from another person toileting, which includes using toliet, bedpan, or urinal?: None Help from another person bathing (including washing, rinsing, drying)?: A Little Help from another person to put on and taking off regular upper body clothing?: None Help from another person to put on and taking off regular lower body clothing?: None 6 Click Score: 23    End of Session    OT Visit Diagnosis: Other abnormalities of gait and mobility (R26.89);Muscle weakness (generalized) (M62.81)   Activity Tolerance Patient tolerated treatment well   Patient Left in bed;with call bell/phone within reach   Nurse Communication          Time: 1450-1502 OT Time Calculation (min): 12 min  Charges: OT General Charges $OT Visit: 1 Visit OT Treatments $Self Care/Home Management : 8-22 mins  Nestor Lewandowsky, OTR/L Acute Rehabilitation Services Pager: [phone] Office: [phone]  Malka So 05/23/2021, 3:02 PM

## 2021-05-23 NOTE — Progress Notes (Signed)
I spoke with the patient via the [place] medical interpreter on the iPad.  We reviewed pathology results as well as treatment recommendations for her gastric ulcer found at endoscopy in December 2022. No pathology letter is needed

## 2021-05-23 NOTE — Progress Notes (Signed)
° ° °  Progress Note   Subjective  Patient denies specific complaint today   Objective  Vital signs in last 24 hours: Temp:  [97.6 F (36.4 C)-98.6 F (37 C)] 98.2 F (36.8 C) (01/12 1547) Pulse Rate:  [68-79] 73 (01/12 1547) Resp:  [17-20] 17 (01/12 1547) BP: (109-164)/(45-71) 140/56 (01/12 1547) SpO2:  [95 %-98 %] 95 % (01/12 1547) Last BM Date: 05/16/21  Gen: awake, alert, NAD HEENT: anicteric  CV: RRR, no mrg Pulm: CTA b/l Abd: soft, NT/ND, +BS throughout Ext: no c/c/e Neuro: nonfocal   Intake/Output from previous day: 01/11 0701 - 01/12 0700 In: 120 [P.O.:120] Out: -  Intake/Output this shift: Total I/O In: 390 [P.O.:120; IV Piggyback:270] Out: -   Lab Results: Recent Labs    05/23/21 0916  WBC 10.6*  HGB 9.2*  HCT 29.8*  PLT 522*      Assessment & Recommendations  76 year old female with metastatic phyllodes tumor involving the lung, spine, colon.  1.  Metastatic phyllodes tumor --patient established now with oncology and radiation oncology.  I met with her to review the colon polyps removed by Dr. Fuller Plan recently.  We discussed that these were consistent with metastatic phyllodes tumor as opposed to colon cancer.  Time provided for questions and answers.  She understands that we will defer to oncology for ongoing treatment recommendation.  2.  Gastric ulcer --H. pylori negative.  She is being treated with twice daily pantoprazole --Plan pantoprazole 40 mg twice daily x8 weeks; thus should continue twice daily therapy until around 07/05/2021; after this reduced to 40 mg once daily going forward to prevent future gastric ulceration  GI service will sign off but please call if questions        LOS: 20 days   Jerene Bears  05/23/2021, 5:20 PM See Shea Evans, [facility], to contact our on call provider

## 2021-05-24 ENCOUNTER — Other Ambulatory Visit: Payer: Self-pay

## 2021-05-24 ENCOUNTER — Ambulatory Visit: Payer: Medicare HMO

## 2021-05-24 ENCOUNTER — Ambulatory Visit
Admit: 2021-05-24 | Discharge: 2021-05-24 | Disposition: A | Discharge status: Home / Self Care | Payer: Medicare HMO | Attending: Radiation Oncology | Admitting: Radiation Oncology

## 2021-05-24 DIAGNOSIS — R918 Other nonspecific abnormal finding of lung field: Secondary | ICD-10-CM | POA: Diagnosis not present

## 2021-05-24 LAB — SURGICAL PATHOLOGY

## 2021-05-24 LAB — GLUCOSE, CAPILLARY: Glucose-Capillary: 110 mg/dL — ABNORMAL HIGH (ref 70–99)

## 2021-05-24 NOTE — Progress Notes (Signed)
Noted patient was assigned to Robin Estrada, Son was called to update transfer, Son discussed with the patient regarding transfer, patient was upset and talking to Son in their own language about the transfer. Son then  told me that patient do not want to be transfer and will just go home if MD agrees to it. Md was paged, MD communicated with the Son. Transfer order was cancelled. Charge Nurse made aware.

## 2021-05-24 NOTE — Plan of Care (Signed)

## 2021-05-24 NOTE — Progress Notes (Signed)
Physical Therapy Treatment Patient Details Name: Robin Estrada MRN: [id number] DOB: [birth year] Today's Date: 05/24/2021   History of Present Illness 76 y.o. female presents to [facility] hospital on 05/02/2021 with complaints of weakness, DOE. 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 s/p bronchoscopy 12/27 with multiple areas biopsied however pathology came back unremarkable. Current plan is for ultrasound-guided biopsy by interventional radiology on Tuesday, 1/3. 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    PT Comments    Pt admitted with above diagnosis. Pt was able to ambulate without physical assist a short distance.  Pt agrees to therapy sporadically and you have to catch her at the right time for her to work with PT. She is also gone to radiation at times as well. Pt has maintained her balance reactions even though she doesn't always work with therapy.  Will decr frequency to 2x week given the barriers as well as mobility is following pt as well. Will continue toward goals.  Pt currently with functional limitations due to the deficits listed below (see PT Problem List). Pt will benefit from skilled PT to increase their independence and safety with mobility to allow discharge to the venue listed below.      Recommendations for follow up therapy are one component of a multi-disciplinary discharge planning process, led by the attending physician.  Recommendations may be updated based on patient status, additional functional criteria and insurance authorization.  Follow Up Recommendations  Outpatient PT     Assistance Recommended at Discharge PRN  Patient can return home with the following     Equipment Recommendations  None recommended by PT    Recommendations for Other Services       Precautions /  Restrictions Precautions Precautions: Fall Precaution Comments: anemic Restrictions Weight Bearing Restrictions: No     Mobility  Bed Mobility Overal bed mobility: Modified Independent Bed Mobility: Rolling Rolling: Independent   Supine to sit: Independent     General bed mobility comments: HOB up    Transfers   Equipment used: None Transfers: Sit to/from Stand Sit to Stand: Supervision           General transfer comment: Did not need assist.  Pt was on stretcher on arrival as she had just come back from radiation treatment. Pt able to get off stretcher without assist.    Ambulation/Gait Ambulation/Gait assistance: Min guard;Supervision Gait Distance (Feet): 40 Feet Assistive device: None Gait Pattern/deviations: Step-through pattern;Decreased stride length;Narrow base of support;Scissoring Gait velocity: decreased Gait velocity interpretation: <1.31 ft/sec, indicative of household ambulator   General Gait Details: Pt would only ambulate to window and back to the bed as she states she is fatigued from her treatment.  Did talk pt into walking to chair to sit up for a bit.    Slow, steady gait with pt seeming to take great caution with no LOB.   Stairs             Wheelchair Mobility    Modified Rankin (Stroke Patients Only)       Balance                                            Cognition Arousal/Alertness: Awake/alert Behavior During Therapy: [facility]  for tasks assessed/performed Overall Cognitive Status: Within Functional Limits for tasks assessed                                          Exercises General Exercises - Lower Extremity Ankle Circles/Pumps: AROM;Both;5 reps;Seated Long Arc Quad: AROM;Both;5 reps;Seated    General Comments        Pertinent Vitals/Pain Pain Assessment: No/denies pain    Home Living                          Prior Function            PT Goals (current goals can  now be found in the care plan section) Progress towards PT goals: Progressing toward goals    Frequency    Min 2X/week      PT Plan Frequency needs to be updated    Co-evaluation              AM-PAC PT "6 Clicks" Mobility   Outcome Measure  Help needed turning from your back to your side while in a flat bed without using bedrails?: None Help needed moving from lying on your back to sitting on the side of a flat bed without using bedrails?: None Help needed moving to and from a bed to a chair (including a wheelchair)?: None Help needed standing up from a chair using your arms (e.g., wheelchair or bedside chair)?: None Help needed to walk in hospital room?: A Little Help needed climbing 3-5 steps with a railing? : A Little 6 Click Score: 22    End of Session Equipment Utilized During Treatment: Gait belt Activity Tolerance: Patient limited by fatigue Patient left: with call bell/phone within reach;in chair;with chair alarm set Nurse Communication: Mobility status PT Visit Diagnosis: Other abnormalities of gait and mobility (R26.89);Muscle weakness (generalized) (M62.81)     Time: 1350-1404 PT Time Calculation (min) (ACUTE ONLY): 14 min  Charges:  $Gait Training: 8-22 mins                     Heer Justiss M,PT Acute [place] [phone] (pager)    [name] 05/24/2021, 3:11 PM

## 2021-05-24 NOTE — Progress Notes (Signed)
[facility] made aware of the transfer.

## 2021-05-24 NOTE — Progress Notes (Signed)
PROGRESS NOTE    Robin Estrada  OMV:[id number] DOB: [birth year] DOA: 05/02/2021 PCP: [name], MD   Brief Narrative:  76 year old woman, Russian-speaking, PMH breast cancer, admitted 12/22 with history of anorexia and weight loss, found to have anemia, cystic lesion of the pancreas, infiltrative disease in the liver and right upper lobe mass.  Seen by GI, underwent EGD showing gastritis and ulcer, seen by palliative medicine with goals to remain full code, no chemotherapy, but would consider radiation.  Underwent bronchoscopy 12/27, multiple areas biopsied but pathology unrevealing.  Underwent colonoscopy 12/29.  Several polyps removed.  Status post CT-guided biopsy by interventional radiology 1/3.  Details as below.  Assessment & Plan:   Principal Problem:   Lung mass Active Problems:   Essential hypertension   History of breast cancer   GI bleed   Chronic blood loss anemia   Leukocytosis   Hypokalemia   Thrombocytosis   Hypoalbuminemia   Pathologic compression fracture of thoracic vertebra (HCC)   Cystic mass of pancreas   Acute on chronic diastolic CHF (congestive heart failure) (HCC)   Iron deficiency anemia   Acute gastric ulcer without hemorrhage or perforation   Wheezing   Benign neoplasm of cecum   Benign neoplasm of sigmoid colon   Therapeutic opioid induced constipation   Osseous metastasis (HCC)   Other cirrhosis of liver (HCC)   Aortic atherosclerosis (HCC)   Emphysema of lung (HCC)   Malignant phyllodes tumor of breast (HCC)   Metastasis to colon (HCC)  Lung mass/osseous metastasis/pathologic T11 compression fracture- (present on admission): per CT: Multiple lytic osseous bone metastases. Most notably a dorsally expansile lesion in the T11 vertebral body which causes moderate to severe spinal canal stenosis.  Pain fairly well controlled on Duragesic and Lortab.  No symptoms of myelopathy or cord compression. MRI thoracic spine shows 35% height loss on  T11.  Patient seen by neurosurgery/Dr. Ellene Route who recommended radiation to the area and no other intervention.  Radiation oncology consulted, patient had simulation done Friday.   Oncology also saw patient.  It looks like patient's lung biopsy pathology is back however I am unable to interpret it.  Will defer to oncology and radiation oncology for that.  Patient started radiation on 05/22/2021.  After getting proper consent from her, patient was transferred to [facility] however patient felt that she was not mentally prepared and her family was upset too. After detailed discussions with multiple unit directors, patient demanded to be returned to [facility] overnight on 05/22/2021 so she can spend the night in the room as she feels comfortable here.  Yesterday once again having the nurse present at her bedside, I discussed with her while her son was on the video call and they both agreed that now they want to go to [place] long.  Transfer order has been placed however per oncology floor director at [facility], there is no bed available at this point in time and they had discussed with the son that these logistical issues may happen anytime and there would be no guarantee of bed availability at [facility] when patient will make her mind.  Patient is on waiting list to be transferred to [facility].  Further management per radiation oncology as well as oncology.  She started her radiation on 05/22/2021 and per my reports, she will need 10 sessions.  Hypertension: Fairly controlled.  Continue atenolol.  Acute on chronic diastolic CHF: Does not appear volume overloaded.  Likely euvolemic.  Lasix stopped.  Resume outpatient diuretic on discharge.  Hypokalemia: Resolved   Emphysema of lung (HCC) --asymptomatic   Other cirrhosis of liver (HCC) --seen on CT, appears stable, consider outpatient follow-up   Acute gastric ulcer without hemorrhage or perforation/chronic blood loss anemia/iron deficiency anemia-  (present on admission) --EGD 12/24: 4 cm HH.  Clean-based, non-bleeding GU, erosive gastritis. Biopsy showed no significant abnormality of small bowel, negative H pylori, no inflammation/dysplasia or malignancy.  Continue PPI.  GI on board. No aspirin, ibuprofen, naproxen, or other non-steroidal anti-inflammatory drugs for 2 weeks after polyp removal.  She received IV iron here.  Will start on oral iron at discharge.  Hemoglobin dropped to 7.0 and she received 1 unit of PRBC transfusion on 05/16/2021.  Hemoglobin checked today is much improved around 9 today.  IV iron was repleted per patient's demand yesterday.  Cystic mass of pancreas- (present on admission) -- CA 19-9 level normal.  On board and they plan to perform MRI/MRCP to evaluate pancreas at some point in time but not urgent.   DVT prophylaxis: SCDs Start: 05/03/21 5400   Code Status: Full Code  Family Communication:  None present at bedside.   Status is: Inpatient  Remains inpatient appropriate because: Awaiting pathology results  Estimated body mass index is 24.62 kg/m as calculated from the following:   Height as of this encounter: 5\' 5"  (1.651 m).   Weight as of this encounter: 67.1 kg.  Nutritional Assessment: Body mass index is 24.62 kg/m.Marland Kitchen Seen by dietician.  I agree with the assessment and plan as outlined below: Nutrition Status: Nutrition Problem: Inadequate oral intake Etiology: poor appetite Signs/Symptoms: per patient/family report Interventions: Boost Plus, MVI  Skin Assessment: I have examined the patient's skin and I agree with the wound assessment as performed by the wound care RN as outlined below:    Consultants:  GI Neurosurgery  Procedures:  None  Antimicrobials:  Anti-infectives (From admission, onward)    None          Subjective:  Seen and examined.  She has no complaints.  Objective: Vitals:   05/23/21 1547 05/23/21 2001 05/24/21 0548 05/24/21 0753  BP: (!) 140/56 (!) 157/71  (!) 149/53 (!) 161/59  Pulse: 73 72 73 74  Resp: 17 18  18   Temp: 98.2 F (36.8 C) 98 F (36.7 C) 98.4 F (36.9 C) 98.3 F (36.8 C)  TempSrc: Oral  Oral Oral  SpO2: 95% 98% 95% 95%  Weight:      Height:        Intake/Output Summary (Last 24 hours) at 05/24/2021 1516 Last data filed at 05/24/2021 1000 Gross per 24 hour  Intake 360 ml  Output --  Net 360 ml    Filed Weights   05/04/21 0945 05/07/21 0815 05/09/21 1117  Weight: 67.1 kg 67.1 kg 67.1 kg    Examination:  General exam: Appears calm and comfortable  Respiratory system: Clear to auscultation. Respiratory effort normal. Cardiovascular system: S1 & S2 heard, RRR. No JVD, murmurs, rubs, gallops or clicks. No pedal edema. Gastrointestinal system: Abdomen is nondistended, soft and nontender. No organomegaly or masses felt. Normal bowel sounds heard. Central nervous system: Alert and oriented. No focal neurological deficits. Extremities: Symmetric 5 x 5 power. Skin: No rashes, lesions or ulcers.    Data Reviewed: I have personally reviewed following labs and imaging studies  CBC: Recent Labs  Lab 05/18/21 0116 05/20/21 0920 05/23/21 0916  WBC 11.8* 11.2* 10.6*  NEUTROABS 9.4* 9.1* 8.6*  HGB 8.4* 8.7*  9.2*  HCT 26.6* 28.4* 29.8*  MCV 73.9* 75.3* 75.1*  PLT 546* 546* 522*    Basic Metabolic Panel: Recent Labs  Lab 05/20/21 0920  NA 133*  K 3.6  CL 94*  CO2 25  GLUCOSE 141*  BUN 8  CREATININE 0.81  CALCIUM 9.0    GFR: Estimated Creatinine Clearance: 53.2 mL/min (by C-G formula based on SCr of 0.81 mg/dL). Liver Function Tests: Recent Labs  Lab 05/20/21 0920  AST 13*  ALT 13  ALKPHOS 97  BILITOT 0.5  PROT 5.9*  ALBUMIN 2.3*    No results for input(s): LIPASE, AMYLASE in the last 168 hours. No results for input(s): AMMONIA in the last 168 hours. Coagulation Profile: No results for input(s): INR, PROTIME in the last 168 hours. Cardiac Enzymes: No results for input(s): CKTOTAL, CKMB,  CKMBINDEX, TROPONINI in the last 168 hours. BNP (last 3 results) No results for input(s): PROBNP in the last 8760 hours. HbA1C: No results for input(s): HGBA1C in the last 72 hours. CBG: Recent Labs  Lab 05/24/21 0755  GLUCAP 110*   Lipid Profile: No results for input(s): CHOL, HDL, LDLCALC, TRIG, CHOLHDL, LDLDIRECT in the last 72 hours. Thyroid Function Tests: No results for input(s): TSH, T4TOTAL, FREET4, T3FREE, THYROIDAB in the last 72 hours. Anemia Panel: No results for input(s): VITAMINB12, FOLATE, FERRITIN, TIBC, IRON, RETICCTPCT in the last 72 hours. Sepsis Labs: No results for input(s): PROCALCITON, LATICACIDVEN in the last 168 hours.  No results found for this or any previous visit (from the past 240 hour(s)).    Radiology Studies: No results found.  Scheduled Meds:  atenolol  100 mg Oral BID   fentaNYL  1 patch Transdermal Q72H   lactose free nutrition  237 mL Oral TID WC   lidocaine  1 patch Transdermal Q24H   multivitamin with minerals  1 tablet Oral Daily   pantoprazole  40 mg Oral BID AC   polyethylene glycol  17 g Oral Daily   sodium chloride flush  3 mL Intravenous Q12H   Continuous Infusions:    LOS: 21 days   Time spent: 30 minutes   Darliss Cheney, MD [facility]  05/24/2021, 3:16 PM  Please page via [place] and do not message via secure chat for anything urgent. Secure chat can be used for anything non urgent.  How to contact the [facility] Attending or Consulting provider [place] or covering provider during after hours [place], for this patient?  Check the care team in [facility] and look for a) attending/consulting TRH provider listed and b) the [facility] team listed. Page or secure chat [phone]-[phone]. Log into [email or web address] and use [place]'s universal password to access. If you do not have the password, please contact the hospital operator. Locate the [facility] provider you are looking for under [facility] and page to a number that you can be directly  reached. If you still have difficulty reaching the provider, please page the [facility] (Director on Call) for the Hospitalists listed on amion for assistance.

## 2021-05-25 DIAGNOSIS — K769 Liver disease, unspecified: Secondary | ICD-10-CM | POA: Diagnosis present

## 2021-05-25 DIAGNOSIS — I5033 Acute on chronic diastolic (congestive) heart failure: Secondary | ICD-10-CM | POA: Diagnosis not present

## 2021-05-25 DIAGNOSIS — K253 Acute gastric ulcer without hemorrhage or perforation: Secondary | ICD-10-CM | POA: Diagnosis not present

## 2021-05-25 DIAGNOSIS — C50919 Malignant neoplasm of unspecified site of unspecified female breast: Secondary | ICD-10-CM | POA: Diagnosis not present

## 2021-05-25 DIAGNOSIS — D500 Iron deficiency anemia secondary to blood loss (chronic): Secondary | ICD-10-CM | POA: Diagnosis not present

## 2021-05-25 MED ORDER — VITAMIN B-12 1000 MCG PO TABS
1000.0000 ug | ORAL_TABLET | Freq: Every day | ORAL | Status: DC
  Administered 2021-05-25 – 2021-05-26 (×2): 1000 ug via ORAL
  Filled 2021-05-25 (×3): qty 1

## 2021-05-25 NOTE — Assessment & Plan Note (Signed)
-  Likely low-grade-GI bleeding causing chronic blood loss and iron deficiency anemia.  EGD on 12/24 showed clean-based gastric ulcer.  GI MD recommending-Protonix 40 mg twice daily x8 weeks, and then decrease to 40 mg daily indefinitely.

## 2021-05-25 NOTE — Assessment & Plan Note (Signed)
S/p mastectomy May 2019-subsequently completed radiation therapy on 01/08/2018.  He used to follow with Dr. Jana Hakim.  She now has recurrence of her phyllodes tumor.

## 2021-05-25 NOTE — Assessment & Plan Note (Signed)
BP stable-continue atenolol.

## 2021-05-25 NOTE — Assessment & Plan Note (Addendum)
Appears to have recurrence of malignant phyllodes tumor-with mets to her lung, colon, T-spine and possible liver.   Evaluated by radiation oncology-with plans to continue serial radiation therapy to her T-spine lesion.  Patient refused to be transferred to [facility] on 1/13-she plans to go home on 1/15-and pursue continued outpatient palliative radiation therapy.  She already has scheduled appointments with radiation therapy-her family will take her to the appointments.  She was also evaluated by medical oncology (Dr. Erie Noe will see her in her office on follow-up for systemic therapy.  Has follow-up with Dr. Burr Medico on 1/25.

## 2021-05-25 NOTE — Assessment & Plan Note (Signed)
CT abdomen on 12/23 showed coarse heterogeneous liver enhancement-infiltrative appearance-suspicion for hepatic metastatic disease-we will defer further to oncology.

## 2021-05-25 NOTE — Progress Notes (Addendum)
PROGRESS NOTE        PATIENT DETAILS Name: Robin Estrada Age: 76 y.o. Sex: female Date of Birth: 1946/02/08 Admit Date: 05/02/2021 Admitting Physician Norval Morton, MD [facility]:[id number], Evie Lacks, MD  Brief Narrative: Patient is a 76 y.o. 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  She was found to have a Hb of 6.9 on admission.   CT imaging on admission  study showed a right upper lobe lung mass with mediastinal/hilar adenopathy, multiple lytic osseous bone metastases-most notably a dorsally expansile lesion in T11 vertebral body with moderate to severe canal stenosis.  Patient was subsequently admitted to the hospitalist service-underwent GI work-up with showed a clean-based gastric ulcer-she was thought to have chronic blood loss and iron deficiency anemia.  She required several units of PRBC transfusion and iron supplementation.  She also underwent extensive work-up was eventually found to have recurrence of her prior malignant phyllodes tumor of her breast with widespread metastatic disease.  Oncology plans to start systemic therapy in the outpatient setting, plans were to proceed with radiation therapy treatment to the T11 lesion.    Radiation therapy was started on 1/11 at [place] demanded to be transferred back to [facility] extensive discussion with prior MD-Dr. Florinda Marker agreed to be transferred back to [facility] to continue radiation therapy-however on 1/13-she changed her mind-and subsequently requested to be discharged home, family planning to take her home on 1/15 after making necessary arrangements.  Patient is now planning to pursue radiation therapy in the outpatient setting.  See below for further  details.   Significant Hospital events/procedures: Pathology/biopsy 12/22> admit to [facility] 12/24>> EGD: None with bleeding clean-based gastric ulcer. 12/27>> bronchoscopy with biopsy 12/29>> Colonoscopy-2 polyps/diverticulosis-s/p polypectomy 01/03>> CT-guided core biopsy of right lung mass 01/11>> radiation therapy treatment started-but demanded to be transferred back to [facility] 01/13>> refused transfer to [facility] discharge home on 1/15.  Pathology/biopsy 12/24>> duodenum/stomach biopsy: No malignancy-H. pylori negative. 12/29>> colon/cecum bx: Poorly differentiated malignancy with sarcomatoid/epithelioid features. 12/27>> BAL/FNAC of lymph node: Negative for malignancy 01/03>> right lung biopsy: Poorly differentiated malignancy with sarcomatoid/epithelioid features  Subjective:  Back pain is better.  Denies any chest pain or shortness of breath.  Extensive interview with iPad translator at bedside-she had a lot of questions regarding anemia, discharge plans.  She acknowledges that she will go home on 1/15-does not want any DME or home health services.  Bedside RN was present as well.  Objective: Vitals: Blood pressure (!) 162/62, pulse 73, temperature 98.5 F (36.9 C), resp. rate 16, height _0  (1.651 m), weight 67.1 kg, SpO2 95 %.   Exam: Gen Exam:Alert awake-not in any distress.  Chronically frail appearing. HEENT:atraumatic, normocephalic Chest: B/L clear to auscultation anteriorly CVS:S1S2 regular Abdomen:soft non tender, non distended Extremities:no edema Neurology: Non focal Skin: no rash  Pertinent Labs/Radiology: CBC Latest Ref Rng & Units 05/23/2021 05/20/2021 05/18/2021  WBC 4.0 - 10.5 K/uL 10.6(H) 11.2(H) 11.8(H)  Hemoglobin 12.0 - 15.0 g/dL 9.2(L) 8.7(L) 8.4(L)  Hematocrit 36.0 - 46.0 % 29.8(L) 28.4(L) 26.6(L)  Platelets 150 - 400 K/uL 522(H) 546(H) 546(H)    Lab Results  Component Value Date   NA 133 (L) 05/20/2021   K 3.6 05/20/2021  CL 94 (L) 05/20/2021    CO2 25 05/20/2021      Assessment/Plan: * Malignant phyllodes tumor of breast ([place])- (present on admission) Appears to have recurrence of malignant phyllodes tumor-with mets to her lung, colon, T-spine and possible liver.   Evaluated by radiation oncology-with plans to continue serial radiation therapy to her T-spine lesion.  Patient refused to be transferred to [facility] on 1/13-she plans to go home on 1/15-and pursue continued outpatient palliative radiation therapy.  She already has scheduled appointments with radiation therapy-her family will take her to the appointments.  She was also evaluated by medical oncology (Dr. Erie Noe will see her in her office on follow-up for systemic therapy.  Has follow-up with Dr. Burr Medico on 1/25.  Pathologic compression fracture of thoracic vertebra ([place])- (present on admission) Pain controlled with current narcotic regimen-evaluated by neurosurgery-Dr. Ellene Route on 1/5 with recommendations for palliative radiation.  She will pursue radiation therapy in the outpatient setting.  Patient remains neurologically intact without any signs of cord compression.  Acute gastric ulcer without hemorrhage or perforation- (present on admission) --EGD 12/24: 4 cm HH.  Clean-based, non-bleeding GU, erosive gastritis. Biopsy was negative for malignancy and H. pylori.  GI recommending Protonix twice daily for 8 weeks, followed by daily indefinitely.  GI bleed- (present on admission) -Likely low-grade-GI bleeding causing chronic blood loss and iron deficiency anemia.  EGD on 12/24 showed clean-based gastric ulcer.  GI MD recommending-Protonix 40 mg twice daily x8 weeks, and then decrease to 40 mg daily indefinitely.  Chronic blood loss anemia- (present on admission) Due to chronic GI bleeding from gastric ulcer.  Hemoglobin stable after 2 units of PRBC transfusion and IV iron supplementation.  Avoid NSAIDs/aspirin. Marland Kitchen  PCP/oncology to monitor hemoglobin in the outpatient  setting.  Iron deficiency anemia- (present on admission) S/p IV iron-start oral iron supplementation.  Suspect etiology to be chronic blood loss from her gastric ulcer.  Will need iron supplementation on discharge   Borderline vitamin B12 deficiency: Continue supplementation  Acute on chronic diastolic CHF (congestive heart failure) ([place]) Euvolemic-no longer on diuretics.  Essential hypertension- (present on admission) BP stable-continue atenolol.  Cystic mass of pancreas- (present on admission)  CA 19-9 level normal. Per GI note on 1/9-this cystic lesion is low priority-MRI/MRCP can be considered in the future at the discretion of oncology.   Aortic atherosclerosis (HCC) --no treatment indicated  Emphysema of lung ([place]) --asymptomatic  Liver disease, unspecified- (present on admission) CT abdomen on 12/23 showed coarse heterogeneous liver enhancement-infiltrative appearance-suspicion for hepatic metastatic disease-we will defer further to oncology.  History of breast cancer S/p mastectomy May 2019-subsequently completed radiation therapy on 01/08/2018.  He used to follow with Dr. Jana Hakim.  She now has recurrence of her phyllodes tumor.   Nutrition Status: Nutrition Problem: Inadequate oral intake Etiology: poor appetite Signs/Symptoms: per patient/family report Interventions: Boost Plus, MVI      BMI Estimated body mass index is 24.62 kg/m as calculated from the following:   Height as of this encounter: _0  (1.651 m).   Weight as of this encounter: 67.1 kg.    DVT Prophylaxis: SCD Procedures: None Consults: GI, PCCM, oncology, radiation oncology, interventional radiology, neurosurgery Code Status:Full code  Family Communication: None at bedside   Disposition Plan: Status is: Inpatient  Remains inpatient appropriate because: Family making arrangements for potential discharge on 1/15.  Extensive metastatic burden from recurrent malignant phyllodes tumor of her  left breast.    Diet: Diet Order  Diet regular Room service appropriate? Yes; Fluid consistency: Thin  Diet effective now                     Antimicrobial agents: Anti-infectives (From admission, onward)    None        MEDICATIONS: Scheduled Meds:  atenolol  100 mg Oral BID   fentaNYL  1 patch Transdermal Q72H   lactose free nutrition  237 mL Oral TID WC   lidocaine  1 patch Transdermal Q24H   multivitamin with minerals  1 tablet Oral Daily   pantoprazole  40 mg Oral BID AC   polyethylene glycol  17 g Oral Daily   sodium chloride flush  3 mL Intravenous Q12H   Continuous Infusions: PRN Meds:.acetaminophen **OR** acetaminophen, albuterol, hydrALAZINE, HYDROcodone-acetaminophen, ondansetron **OR** ondansetron (ZOFRAN) IV, senna   I have personally reviewed following labs and imaging studies  LABORATORY DATA: CBC: Recent Labs  Lab 05/20/21 0920 05/23/21 0916  WBC 11.2* 10.6*  NEUTROABS 9.1* 8.6*  HGB 8.7* 9.2*  HCT 28.4* 29.8*  MCV 75.3* 75.1*  PLT 546* 522*    Basic Metabolic Panel: Recent Labs  Lab 05/20/21 0920  NA 133*  K 3.6  CL 94*  CO2 25  GLUCOSE 141*  BUN 8  CREATININE 0.81  CALCIUM 9.0    GFR: Estimated Creatinine Clearance: 53.2 mL/min (by C-G formula based on SCr of 0.81 mg/dL).  Liver Function Tests: Recent Labs  Lab 05/20/21 0920  AST 13*  ALT 13  ALKPHOS 97  BILITOT 0.5  PROT 5.9*  ALBUMIN 2.3*   No results for input(s): LIPASE, AMYLASE in the last 168 hours. No results for input(s): AMMONIA in the last 168 hours.  Coagulation Profile: No results for input(s): INR, PROTIME in the last 168 hours.  Cardiac Enzymes: No results for input(s): CKTOTAL, CKMB, CKMBINDEX, TROPONINI in the last 168 hours.  BNP (last 3 results) No results for input(s): PROBNP in the last 8760 hours.  Lipid Profile: No results for input(s): CHOL, HDL, LDLCALC, TRIG, CHOLHDL, LDLDIRECT in the last 72 hours.  Thyroid  Function Tests: No results for input(s): TSH, T4TOTAL, FREET4, T3FREE, THYROIDAB in the last 72 hours.  Anemia Panel: No results for input(s): VITAMINB12, FOLATE, FERRITIN, TIBC, IRON, RETICCTPCT in the last 72 hours.  Urine analysis:    Component Value Date/Time   COLORURINE STRAW (A) 05/02/2021 2318   APPEARANCEUR CLEAR 05/02/2021 2318   LABSPEC 1.008 05/02/2021 2318   PHURINE 7.0 05/02/2021 2318   GLUCOSEU NEGATIVE 05/02/2021 2318   GLUCOSEU NEGATIVE 04/01/2021 0830   HGBUR NEGATIVE 05/02/2021 2318   BILIRUBINUR NEGATIVE 05/02/2021 2318   KETONESUR 5 (A) 05/02/2021 2318   PROTEINUR NEGATIVE 05/02/2021 2318   UROBILINOGEN 0.2 04/01/2021 0830   NITRITE NEGATIVE 05/02/2021 2318   LEUKOCYTESUR NEGATIVE 05/02/2021 2318    Sepsis Labs: Lactic Acid, Venous No results found for: LATICACIDVEN  MICROBIOLOGY: No results found for this or any previous visit (from the past 240 hour(s)).  RADIOLOGY STUDIES/RESULTS: No results found.   LOS: 22 days   Oren Binet, MD  [facility]    To contact the attending provider between 7A-7P or the covering provider during after hours 7P-7A, please log into the web site [email or web address] and access using universal [place] password for that web site. If you do not have the password, please call the hospital operator.  05/25/2021, 2:07 PM

## 2021-05-26 DIAGNOSIS — C50919 Malignant neoplasm of unspecified site of unspecified female breast: Secondary | ICD-10-CM | POA: Diagnosis not present

## 2021-05-26 DIAGNOSIS — G9520 Unspecified cord compression: Secondary | ICD-10-CM | POA: Diagnosis not present

## 2021-05-26 DIAGNOSIS — K253 Acute gastric ulcer without hemorrhage or perforation: Secondary | ICD-10-CM | POA: Diagnosis not present

## 2021-05-26 DIAGNOSIS — C785 Secondary malignant neoplasm of large intestine and rectum: Secondary | ICD-10-CM | POA: Diagnosis not present

## 2021-05-26 DIAGNOSIS — K769 Liver disease, unspecified: Secondary | ICD-10-CM | POA: Diagnosis not present

## 2021-05-26 LAB — CBC
HCT: 34.2 % — ABNORMAL LOW (ref 36.0–46.0)
Hemoglobin: 10.5 g/dL — ABNORMAL LOW (ref 12.0–15.0)
MCH: 23.2 pg — ABNORMAL LOW (ref 26.0–34.0)
MCHC: 30.7 g/dL (ref 30.0–36.0)
MCV: 75.7 fL — ABNORMAL LOW (ref 80.0–100.0)
Platelets: 473 10*3/uL — ABNORMAL HIGH (ref 150–400)
RBC: 4.52 MIL/uL (ref 3.87–5.11)
RDW: 20.6 % — ABNORMAL HIGH (ref 11.5–15.5)
WBC: 10.3 10*3/uL (ref 4.0–10.5)
nRBC: 0 % (ref 0.0–0.2)

## 2021-05-26 LAB — TYPE AND SCREEN
ABO/RH(D): B POS
Antibody Screen: NEGATIVE

## 2021-05-26 MED ORDER — HYDROCODONE-ACETAMINOPHEN 5-325 MG PO TABS: 1.0000 | ORAL_TABLET | Freq: Two times a day (BID) | ORAL | 0 refills | Status: DC | PRN

## 2021-05-26 MED ORDER — FERROUS SULFATE 325 (65 FE) MG PO TABS: 325.0000 mg | ORAL_TABLET | Freq: Two times a day (BID) | ORAL | 0 refills | Status: DC

## 2021-05-26 MED ORDER — FENTANYL 12 MCG/HR TD PT72: 1.0000 | MEDICATED_PATCH | TRANSDERMAL | 0 refills | Status: DC

## 2021-05-26 MED ORDER — FERROUS SULFATE 325 (65 FE) MG PO TABS
325.0000 mg | ORAL_TABLET | Freq: Two times a day (BID) | ORAL | Status: DC
  Administered 2021-05-26: 325 mg via ORAL
  Filled 2021-05-26: qty 1

## 2021-05-26 MED ORDER — PANTOPRAZOLE SODIUM 40 MG PO TBEC: 40.0000 mg | DELAYED_RELEASE_TABLET | Freq: Two times a day (BID) | ORAL | 2 refills | Status: DC

## 2021-05-26 MED ORDER — CYANOCOBALAMIN 1000 MCG PO TABS: 1000.0000 ug | ORAL_TABLET | Freq: Every day | ORAL | 0 refills | Status: DC

## 2021-05-26 MED ORDER — ONDANSETRON HCL 4 MG PO TABS: 4.0000 mg | ORAL_TABLET | Freq: Three times a day (TID) | ORAL | 0 refills | Status: DC | PRN

## 2021-05-26 MED ORDER — ACETAMINOPHEN 500 MG PO TABS: 500.0000 mg | ORAL_TABLET | Freq: Three times a day (TID) | ORAL | 0 refills | Status: DC | PRN

## 2021-05-26 MED ORDER — ADULT MULTIVITAMIN W/MINERALS CH: 1.0000 | ORAL_TABLET | Freq: Every day | ORAL | 0 refills | Status: DC

## 2021-05-26 NOTE — Discharge Summary (Signed)
Robin Estrada PYK:[id number] DOB: 19-Mar-1946 DOA: 05/02/2021  PCP: [name], MD  Admit date: 05/02/2021  Discharge date: 05/26/2021  Admitted From: Home   Disposition:  Home   Recommendations for Outpatient Follow-up:   Follow up with PCP in 1-2 weeks  PCP Please obtain BMP/CBC, 2 view CXR in 1week,  (see Discharge instructions)   PCP Please follow up on the following pending results: Needs close outpatient GI, oncology and radiation oncology follow-up.  Monitor anemia panel, CBC and BMP closely   Home Health: refused   Equipment/Devices: refused  Consultations: GI, PCCM, oncology, radiation oncology, interventional radiology, neurosurgery Discharge Condition: Fair CODE STATUS: Full    Diet Recommendation: Heart Healthy   Diet Order             Diet - low sodium heart healthy           Diet regular Room service appropriate? Yes; Fluid consistency: Thin  Diet effective now                    Chief Complaint  Patient presents with   Weakness     Brief history of present illness from the day of admission and additional interim summary    Patient is a 76 y.o. 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  She was found to have a Hb of 6.9 on admission.   CT imaging on admission  study showed a right upper lobe lung mass with mediastinal/hilar adenopathy, multiple lytic osseous bone metastases-most notably a dorsally expansile lesion in T11 vertebral body with moderate to severe canal stenosis.  Patient was subsequently admitted to the hospitalist  service-underwent GI work-up with showed a clean-based gastric ulcer-she was thought to have chronic blood loss and iron deficiency anemia.  She required several units of PRBC transfusion and iron supplementation.  She also underwent extensive work-up was eventually found to have recurrence of her prior malignant phyllodes tumor of her breast with widespread metastatic disease.  Oncology plans to start systemic therapy in the outpatient setting, plans were to proceed with radiation therapy treatment to the T11 lesion.     Radiation therapy was started on 1/11 at [place] demanded to be transferred back to [facility] extensive discussion with prior MD-Dr. Florinda Marker agreed to be transferred back to [facility] to continue radiation therapy-however on 1/13-she changed her mind-and subsequently requested to be discharged home, family planning to take her home on 1/15 after making necessary arrangements.  Patient is now planning to pursue radiation therapy in the outpatient setting.  See below for further details.     Significant Hospital events/procedures: Pathology/biopsy 12/22> admit to [facility] 12/24>> EGD: None with bleeding clean-based gastric ulcer. 12/27>> bronchoscopy with biopsy 12/29>> Colonoscopy-2 polyps/diverticulosis-s/p polypectomy 01/03>> CT-guided core biopsy of right lung mass 01/11>> radiation therapy treatment started-but demanded to be transferred back to [facility]  01/13>> refused transfer to [facility] discharge home on 1/15.   Pathology/biopsy 12/24>> duodenum/stomach biopsy: No malignancy-H. pylori negative. 12/29>> colon/cecum bx: Poorly differentiated malignancy with sarcomatoid/epithelioid features. 12/27>> BAL/FNAC of lymph node: Negative for malignancy 01/03>> right lung biopsy: Poorly differentiated malignancy with sarcomatoid/epithelioid features                                                                   Hospital Course    Malignant phyllodes  tumor of breast ([place])- (present on admission) Appears to have recurrence of malignant phyllodes tumor-with mets to her lung, colon, T-spine and possible liver.   Evaluated by radiation oncology-with plans to continue serial radiation therapy to her T-spine lesion.  Patient refused to be transferred to [facility] on 1/13-she plans to go home on 1/15-and pursue continued outpatient palliative radiation therapy.  She already has scheduled appointments with radiation therapy-her family will take her to the appointments.  She was also evaluated by medical oncology (Dr. Erie Noe will see her in her office on follow-up for systemic therapy.  Has follow-up with Dr. Burr Medico on 1/25.  Son updated in detail on 05/26/2021 agrees with plan of home discharge.   Pathologic compression fracture of thoracic vertebra ([place])- (present on admission) Pain controlled with current narcotic regimen-evaluated by neurosurgery-Dr. Ellene Route on 1/5 with recommendations for palliative radiation.  She will pursue radiation therapy in the outpatient setting.  Patient remains neurologically intact without any signs of cord compression.   Acute gastric ulcer without hemorrhage or perforation- (present on admission) --EGD 12/24: 4 cm HH.  Clean-based, non-bleeding GU, erosive gastritis. Biopsy was negative for malignancy and H. pylori.  GI recommending Protonix twice daily for 8 weeks, followed by daily indefinitely.  Stable H&H at the time of discharge.   GI bleed- (present on admission) -Likely low-grade-GI bleeding causing chronic blood loss and iron deficiency anemia.  EGD on 12/24 showed clean-based gastric ulcer.  GI MD recommending-Protonix 40 mg twice daily x8 weeks, and then decrease to 40 mg daily indefinitely.   Chronic blood loss anemia- (present on admission) Due to chronic GI bleeding from gastric ulcer.  Hemoglobin stable after 2 units of PRBC transfusion and IV iron supplementation.  Avoid NSAIDs/aspirin. Marland Kitchen  PCP/oncology to  monitor hemoglobin in the outpatient setting.   Iron deficiency anemia- (present on admission) S/p IV iron-start oral iron supplementation.  Suspect etiology to be chronic blood loss from her gastric ulcer.  Will need iron supplementation on discharge    Borderline vitamin B12 deficiency: Continue supplementation   Acute on chronic diastolic CHF (congestive heart failure) ([place]) Euvolemic-no longer on diuretics.   Essential hypertension- (present on admission) BP stable-continue atenolol.   Cystic mass of pancreas- (present on admission)  CA 19-9 level normal. Per GI note on 1/9-this cystic lesion is low priority-MRI/MRCP can be considered in the future at the discretion of oncology.    Aortic atherosclerosis (HCC) --no treatment indicated   Emphysema of lung ([place]) --asymptomatic   Liver disease, unspecified- (present on admission) CT abdomen on 12/23 showed coarse heterogeneous liver enhancement-infiltrative appearance-suspicion for hepatic metastatic disease-we will defer further to oncology.   History of breast cancer S/p mastectomy May 2019-subsequently completed radiation therapy on 01/08/2018.  She used to follow with Dr. Jana Hakim.  She  now has recurrence of her phyllodes tumor.   Discharge diagnosis     Principal Problem:   Malignant phyllodes tumor of breast [facility]) Active Problems:   Essential hypertension   History of breast cancer   GI bleed   Chronic blood loss anemia   Pathologic compression fracture of thoracic vertebra (HCC)   Cystic mass of pancreas   Acute on chronic diastolic CHF (congestive heart failure) (HCC)   Iron deficiency anemia   Acute gastric ulcer without hemorrhage or perforation   Aortic atherosclerosis (HCC)   Emphysema of lung (HCC)   Liver disease, unspecified    Discharge instructions    Discharge Instructions     Diet - low sodium heart healthy   Complete by: As directed    Discharge instructions   Complete by: As directed     Follow with Primary MD Plotnikov, Evie Lacks, MD and your oncologist in 7 days, keep up your radiation oncology appointments as before unchanged  Get CBC, CMP, anemia panel, B12, TSH levels -  checked next visit within 1 week by Primary MD    Activity: As tolerated with Full fall precautions use walker/cane & assistance as needed  Disposition Home   Diet: Heart Healthy    Special Instructions: If you have smoked or chewed Tobacco  in the last 2 yrs please stop smoking, stop any regular Alcohol  and or any Recreational drug use.  On your next visit with your primary care physician please Get Medicines reviewed and adjusted.  Please request your Prim.MD to go over all Hospital Tests and Procedure/Radiological results at the follow up, please get all Hospital records sent to your Prim MD by signing hospital release before you go home.  If you experience worsening of your admission symptoms, develop shortness of breath, life threatening emergency, suicidal or homicidal thoughts you must seek medical attention immediately by calling 911 or calling your MD immediately  if symptoms less severe.  You Must read complete instructions/literature along with all the possible adverse reactions/side effects for all the Medicines you take and that have been prescribed to you. Take any new Medicines after you have completely understood and accpet all the possible adverse reactions/side effects.   Increase activity slowly   Complete by: As directed    No wound care   Complete by: As directed        Discharge Medications   Allergies as of 05/26/2021       Reactions   Statins Nausea And Vomiting, Other (See Comments)   MUSCLE PAIN   Amlodipine    fatigue   Clonidine Derivatives    headache   Losartan    abd pain   Sulfa Antibiotics Nausea Only   Sulfamethoxazole-trimethoprim Nausea Only   " Severe Nausea "        Medication List     STOP taking these medications    aspirin 81 MG  tablet   cloNIDine 0.1 mg/24hr patch Commonly known as: Catapres-TTS-1       TAKE these medications    acetaminophen 500 MG tablet Commonly known as: TYLENOL Take 1 tablet (500 mg total) by mouth every 8 (eight) hours as needed for headache. What changed:  how much to take when to take this   atenolol 100 MG tablet Commonly known as: TENORMIN Take 1 tablet (100 mg total) by mouth 2 (two) times daily.   cholecalciferol 1000 units tablet Commonly known as: VITAMIN D Take 1 tablet (1,000 Units total) by mouth daily.  cyanocobalamin 1000 MCG tablet Take 1 tablet (1,000 mcg total) by mouth daily. Start taking on: May 27, 2021   fentaNYL 12 MCG/HR Commonly known as: [place] 1 patch onto the skin every 3 (three) days.   ferrous sulfate 325 (65 FE) MG tablet Take 1 tablet (325 mg total) by mouth 2 (two) times daily with a meal.   HYDROcodone-acetaminophen 5-325 MG tablet Commonly known as: NORCO/VICODIN Take 1 tablet by mouth every 12 (twelve) hours as needed for moderate pain or severe pain.   multivitamin with minerals Tabs tablet Take 1 tablet by mouth daily. Start taking on: May 27, 2021   ondansetron 4 MG tablet Commonly known as: ZOFRAN Take 1 tablet (4 mg total) by mouth every 8 (eight) hours as needed for nausea.   pantoprazole 40 MG tablet Commonly known as: PROTONIX Take 1 tablet (40 mg total) by mouth 2 (two) times daily before a meal.   triamterene-hydrochlorothiazide 37.5-25 MG tablet Commonly known as: MAXZIDE-25 Take 1 tablet by mouth daily.   vitamin C 500 MG tablet Commonly known as: ASCORBIC ACID Take 1 tablet (500 mg total) by mouth every other day.         Follow-up Information     Truitt Merle, MD Follow up on 06/05/2021.   Specialties: Hematology, Oncology Why: appointment at 1:20 pm Contact information: [place] [address] [phone]         Plotnikov, Evie Lacks, MD. Schedule an  appointment as soon as possible for a visit in 1 week(s).   Specialty: Internal Medicine Why: Keep up all your radiation oncology appointments as before unchanged Contact information: [place] [address] [phone]         Mansouraty, Telford Nab., MD. Schedule an appointment as soon as possible for a visit in 1 week(s).   Specialties: Gastroenterology, Internal Medicine Contact information: [place]  [phone]                 Major procedures and Radiology Reports - PLEASE review detailed and final reports thoroughly  -       DG Chest 2 View  Result Date: 05/03/2021 CLINICAL DATA:  Weakness history of breast mass, initial encounter EXAM: CHEST - 2 VIEW COMPARISON:  11/09/2017 FINDINGS: Check shadow is mildly enlarged. The lungs are well aerated bilaterally. There is a 3.9 x 3.0 cm rounded soft tissue mass lesion projecting in the posterior aspect of the right upper lobe along the major fissure. This was not seen on the prior exam and is felt to represent a primary pulmonary neoplasm till proven otherwise. No bony abnormality is noted. IMPRESSION: Right upper lobe mass. CT of the chest with contrast is recommended for further evaluation. Electronically Signed   By: Inez Catalina M.D.   On: 05/03/2021 00:09   CT Head Wo Contrast  Result Date: 05/03/2021 CLINICAL DATA:  76 year old female with headache weakness, decreased P.O. Right side flank pain for 3 months. EXAM: CT HEAD WITHOUT CONTRAST TECHNIQUE: Contiguous axial images were obtained from the base of the skull through the vertex without intravenous contrast. COMPARISON:  Head CT 06/02/2015. FINDINGS: Brain: Cerebral volume is within normal limits for age. No midline shift, ventriculomegaly, mass effect, evidence of mass lesion, intracranial hemorrhage or evidence of cortically based acute infarction. Gray-white matter differentiation is within normal limits throughout the  brain. Vascular: Mild Calcified atherosclerosis at the skull base. No suspicious intracranial vascular hyperdensity. Skull: Stable, negative. Sinuses/Orbits:  Visualized paranasal sinuses and mastoids are clear. Tympanic cavities are clear. Other: Visualized orbits and scalp soft tissues are within normal limits. IMPRESSION: Normal for age noncontrast Head CT. Electronically Signed   By: Genevie Ann M.D.   On: 05/03/2021 04:49   CT CHEST W CONTRAST  Result Date: 05/03/2021 CLINICAL DATA:  Concern for lung nodule.  Abnormal x-ray EXAM: CT CHEST WITH CONTRAST TECHNIQUE: Multidetector CT imaging of the chest was performed during intravenous contrast administration. CONTRAST:  82m OMNIPAQUE IOHEXOL 300 MG/ML  SOLN COMPARISON:  Chest x-ray earlier the same day FINDINGS: Cardiovascular: Heart is enlarged. No pericardial effusion identified. Main pulmonary artery is normal caliber. Thoracic aorta is normal caliber with mild atherosclerotic plaques. Mediastinum/Nodes: A few enlarged mediastinal lymph nodes measuring up to 11 mm in short axis in the superior mediastinum and 10 mm in short axis precarinal. Enlarged right hilar lymph node measuring 11 mm in short axis. No axillary lymphadenopathy identified. Subcentimeter right thyroid lobe nodule noted. Lungs/Pleura: Lungs are hyperinflated with mild emphysematous changes and mild hazy mosaic attenuation throughout. There is a solid centrally necrotic mass identified in the posterior aspect of the right upper lobe which measures 2.7 x 3.8 x 2.8 cm and partially abuts the major fissure and the posterolateral pleura. The mass demonstrates lobulated slightly irregular borders. 6 mm pleural-based pulmonary nodule in the posterior aspect of the right upper lobe, superior to the mass. A few small adjacent pulmonary nodular densities in the left upper lobe subpleural measuring up to 4 mm in size. Small right and trace left pleural effusions. No pneumothorax. Upper Abdomen: No acute  process identified in the visualized upper abdomen. Nodular contour of the liver suggesting cirrhosis. At least 3 cystic density lesions in the pancreas measuring up to 16 mm in the tail the pancreas. Musculoskeletal: Multiple lytic likely metastatic bone lesions identified in the spine and ribs. Largest lytic lesion is in the T11 vertebral body which is expansile and extends 8 mm dorsal to the posterior vertebral body margin and causes moderate to severe spinal canal stenosis. IMPRESSION: 1. Solid and centrally necrotic lung mass in the right upper lobe as described, consistent with primary malignancy. 2. Emphysematous changes of the lungs. A few additional small pulmonary nodules bilaterally. 3. Small right and trace left pleural effusions. 4. Mediastinal and right hilar mild adenopathy, possibly metastatic. 5. Multiple lytic osseous bone metastases. Most notably a dorsally expansile lesion in the T11 vertebral body which causes moderate to severe spinal canal stenosis. Correlate clinically for need for MRI evaluation. 6. Chronic findings in the abdomen including hepatic cirrhosis and at least 3 hypodense cystic lesions in the pancreas which may represent cysts, pseudocysts, or side branch IPMN. Aortic Atherosclerosis (ICD10-I70.0) and Emphysema (ICD10-J43.9). Electronically Signed   By: DOfilia NeasM.D.   On: 05/03/2021 10:34   MR THORACIC SPINE W WO CONTRAST  Result Date: 05/15/2021 CLINICAL DATA:  T11 metastatic disease.  Right upper lobe lung mass. EXAM: MRI THORACIC WITHOUT AND WITH CONTRAST TECHNIQUE: Multiplanar and multiecho pulse sequences of the thoracic spine were obtained without and with intravenous contrast. CONTRAST:  6.563mGADAVIST GADOBUTROL 1 MMOL/ML IV SOLN COMPARISON:  CT chest dated May 03, 2021. FINDINGS: Alignment:  Physiologic. Vertebrae: Multiple thoracic vertebral body metastatic lesions. Pathologic fracture of the T11 vertebral body with up to 35% height loss. Thin anterior  epidural tumor extension with associated enhancement (series 10, image 12). No other sites of epidural extension. Cord:  Normal signal and morphology.  No intrathecal  enhancement. Paraspinal and other soft tissues: Large right upper lobe mass. 2.1 cm cystic lesion in the pancreatic tail. Disc levels: No significant disc bulge or herniation. Bulging of the T11 posterior vertebral body and overlying anterior epidural tumor extension results in moderate spinal canal stenosis with slight cord flattening. No neuroforaminal stenosis at any level. IMPRESSION: 1. Multiple thoracic vertebral body metastases again noted. Pathologic fracture of the T11 vertebral body with up to 35% height loss. Bulging of the posterior vertebral body margin with overlying thin anterior epidural tumor extension results in moderate spinal canal stenosis at this level. No cord signal abnormality. 2. Right upper lobe lung cancer as seen on prior studies. 3. 2.1 cm cystic lesion in the pancreatic tail. Please see prior CT abdomen and pelvis report from 05/03/2021 for follow-up recommendations. Electronically Signed   By: Titus Dubin M.D.   On: 05/15/2021 17:29   CT ABDOMEN PELVIS W CONTRAST  Result Date: 05/03/2021 CLINICAL DATA:  76 year old female with headache weakness, decreased P.O. Right side flank pain for 3 months. 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 EXAM: CT ABDOMEN AND PELVIS WITH CONTRAST TECHNIQUE: Multidetector CT imaging of the abdomen and pelvis was performed using the standard protocol following bolus administration of intravenous contrast. CONTRAST:  139m OMNIPAQUE IOHEXOL 300 MG/ML  SOLN COMPARISON:  Chest CT 11/10/2017. FINDINGS: Lower chest: Small layering pleural effusions are new since 2019, larger on the right with simple fluid density. No pericardial effusion. Cardiac size now at the upper limits of normal. Mild lung base atelectasis. Hepatobiliary: Coarse,  heterogeneous liver enhancement is new since 2019. This has an infiltrative appearance, without a discrete liver mass or lesion. Gallbladder remains within normal limits. Pancreas: Small circumscribed simple fluid density cystic mass at the tail of the pancreas is 18 mm on series 5, image 27. Elsewhere the pancreatic parenchyma appears somewhat atrophied. No regional inflammation. Spleen: Negative. Adrenals/Urinary Tract: Normal adrenal glands. Symmetric renal enhancement and contrast excretion. Distended but otherwise unremarkable bladder. Stomach/Bowel: Redundant but decompressed large bowel in the abdomen and pelvis. There is retained stool in the right colon. Normal appendix on coronal image 45. Cecum appears mildly distended with contrast. Terminal ileum is decompressed. No dilated small bowel. Negative stomach and duodenum. No free air. No abdominal free fluid. Vascular/Lymphatic: Aortoiliac calcified atherosclerosis. Major arterial structures in the abdomen and pelvis are patent. Portal venous system is patent. No lymphadenopathy. Reproductive: Small uterine fibroids, otherwise within normal limits. Other: There is trace free fluid in the cul-de-sac on series 5, image 73. Musculoskeletal: Lytic lesion with posterior expansion, retropulsion of tumor from the T11 vertebral body (sagittal image 58). Mild to moderate associated spinal stenosis there (series 5, image 19). Mild associated T11 loss of height, compression. Right side T8 vertebral body roughly 11 mm lytic lesion is also new since 2019 on sagittal image 57. Similar lytic lesions in the L4 and S1 bodies. Visible ribs appear intact. There is a 2 cm lytic lesion of the medial left iliac bone with probable early extraosseous extension on series 5, image 60. Elsewhere the pelvis appears intact. IMPRESSION: 1. Fairly widespread osseous metastatic disease, with lytic lesions in the spine and pelvis. Pathologic T11 compression fracture with epidural extension  of tumor resulting in mild to moderate spinal stenosis. If there are myelopathic symptoms consider T11 cord compression. 2. Heterogeneous enhancement of the liver is new since 2019. The appearance is infiltrative and in light of #1 hepatic metastatic disease cannot be  excluded although no discrete liver mass. Alternatively consider hepatitis or other intrinsic hepatocellular disease. 3. Small layering pleural effusions. Trace free fluid in the pelvis. 4. Small 1.8 cm cystic mass at the tail of the pancreas, with some background pancreatic atrophy. This is indeterminate and meets consensus criteria for re-imaging q6 months x4 to evaluate growth versus stability. 5. Aortic Atherosclerosis (ICD10-I70.0). Electronically Signed   By: Genevie Ann M.D.   On: 05/03/2021 04:59   DG Chest Port 1 View  Result Date: 05/14/2021 CLINICAL DATA:  Pneumothorax after right lung biopsy EXAM: PORTABLE CHEST 1 VIEW COMPARISON:  04/18/2021, 05/14/2021 FINDINGS: Single frontal view of the chest demonstrates an unremarkable cardiac silhouette. Previously identified right upper lobe mass again seen. No new airspace disease. No evidence of pleural effusion. I do not see any pneumothorax after recent right lung biopsy. No acute bony abnormalities. IMPRESSION: 1. No complication after right lung biopsy. 2. Stable right lung mass. Electronically Signed   By: Randa Ngo M.D.   On: 05/14/2021 15:21   DG CHEST PORT 1 VIEW  Result Date: 05/08/2021 CLINICAL DATA:  Bronchoscopy yesterday, cough EXAM: PORTABLE CHEST 1 VIEW COMPARISON:  Chest radiograph 1 day prior, CT chest 05/03/2021 FINDINGS: The cardiomediastinal silhouette is stable. The approximally 4.6 cm mass in the right upper lobe is unchanged. Opacities surrounding this mass seen on the prior radiograph are decreased in conspicuity. There is no new or worsening focal airspace disease. There is no pleural effusion. There is no pneumothorax. The bones are stable. IMPRESSION: Interval  decreased in conspicuity of patchy opacities surrounding the right upper lobe mass compared to the study from 1 day prior. No new or worsening focal airspace disease. No pneumothorax. Electronically Signed   By: Valetta Mole M.D.   On: 05/08/2021 08:45   DG CHEST PORT 1 VIEW  Result Date: 05/07/2021 CLINICAL DATA:  Status post bronchoscopy EXAM: PORTABLE CHEST 1 VIEW COMPARISON:  05/02/2021 FINDINGS: New patchy alveolar densities seen in the right upper lung fields. Previously seen 4 cm nodular density in the right upper lung fields is obscured by the new infiltrate. Findings suggest possible pulmonary hemorrhage related to bronchoscopic biopsy. Rest of the lung fields are unremarkable. There is no pleural effusion or pneumothorax. IMPRESSION: New alveolar infiltrate is seen in the right upper lung fields possibly suggesting pulmonary hemorrhage related to bronchoscopic biopsy. Electronically Signed   By: Elmer Picker M.D.   On: 05/07/2021 12:00   CT LUNG MASS BIOPSY  Result Date: 05/14/2021 CLINICAL DATA:  Posterior right upper lobe lung mass measuring up to approximately 3.8 cm by CT. Bronchoscopy on 05/07/2021 with sampling was not conclusive for malignancy. EXAM: CT GUIDED CORE BIOPSY OF RIGHT UPPER LOBE LUNG MASS ANESTHESIA/SEDATION: Moderate (conscious) sedation was employed during this procedure. A total of Versed 2.0 mg and Fentanyl 50 mcg was administered intravenously by radiology nursing. Moderate Sedation Time: 39 minutes. The patient's level of consciousness and vital signs were monitored continuously by radiology nursing throughout the procedure under my direct supervision. PROCEDURE: The procedure risks, benefits, and alternatives were explained to the patient. Questions regarding the procedure were encouraged and answered. The patient understands and consents to the procedure. A time-out was performed prior to initiating the procedure. Initial CT was performed through the chest in a  prone position. The right posterior and posterolateral chest wall was prepped with chlorhexidine in a sterile fashion, and a sterile drape was applied covering the operative field. A sterile gown and sterile gloves were used  for the procedure. Local anesthesia was provided with 1% Lidocaine. Under CT guidance, past a 17 gauge trocar needle was advanced to the level of a posterior right upper lobe lung mass. After confirming needle tip position, an 18 gauge core biopsy sample was obtained. Additional CT was performed. Aspiration of air was then performed in the posterior right pleural space through the outer needle utilizing a 3 way stopcock and syringe. Additional CT was performed. COMPLICATIONS: Small right posterior pneumothorax treated with needle aspiration. FINDINGS: Initial imaging again demonstrates a solid and lobulated oval-shaped mass in the posterior right upper lobe that abuts the major fissure and posterolateral pleural surface. There was significant difficulty in approaching the mass percutaneously due to overlying ribs and the scapula. Eventually a window was available along the lateral aspect of the mass for biopsy which successfully yielded a single intact core biopsy sample. After the first biopsy sample was obtained, there was development of a small posterior pneumothorax. This was treated with needle aspiration resulting in diminished size of the pneumothorax. Due to pneumothorax development, further core biopsy samples were not attempted to be attained. IMPRESSION: CT-guided core biopsy of right upper lobe lung mass. Approach to the mass was difficult due to intervening scapula and ribs. After a single core biopsy was obtained, there was development of a small pneumothorax which was treated with needle aspiration. Electronically Signed   By: Aletta Edouard M.D.   On: 05/14/2021 16:36     Today   Subjective    Anuja Corwin today has no headache,no chest abdominal pain,no new weakness  tingling or numbness, feels much better wants to go home today.     Objective   Blood pressure (!) 169/67, pulse 74, temperature 99.1 F (37.3 C), temperature source Oral, resp. rate 17, height _0  (1.651 m), weight 67.1 kg, SpO2 97 %.   Intake/Output Summary (Last 24 hours) at 05/26/2021 1130 Last data filed at 05/26/2021 0850 Gross per 24 hour  Intake 330 ml  Output --  Net 330 ml    Exam  Awake Alert, No new F.N deficits,    [place].AT,PERRAL Supple Neck,   Symmetrical Chest wall movement, Good air movement bilaterally, CTAB RRR,No Gallops,   +ve B.Sounds, Abd Soft, Non tender,  No Cyanosis, Clubbing or edema    Data Review   CBC w Diff:  Lab Results  Component Value Date   WBC 10.3 05/26/2021   HGB 10.5 (L) 05/26/2021   HGB 12.3 11/09/2017   HCT 34.2 (L) 05/26/2021   PLT 473 (H) 05/26/2021   PLT 269 11/09/2017   LYMPHOPCT 8 05/23/2021   MONOPCT 8 05/23/2021   EOSPCT 3 05/23/2021   BASOPCT 1 05/23/2021    CMP:  Lab Results  Component Value Date   NA 133 (L) 05/20/2021   K 3.6 05/20/2021   CL 94 (L) 05/20/2021   CO2 25 05/20/2021   BUN 8 05/20/2021   CREATININE 0.81 05/20/2021   CREATININE 0.86 11/09/2017   PROT 5.9 (L) 05/20/2021   ALBUMIN 2.3 (L) 05/20/2021   BILITOT 0.5 05/20/2021   BILITOT 0.4 11/09/2017   ALKPHOS 97 05/20/2021   AST 13 (L) 05/20/2021   AST 23 11/09/2017   ALT 13 05/20/2021   ALT 34 11/09/2017  .   Total Time in preparing paper work, data evaluation and todays exam - 28 minutes  Lala Lund M.D on 05/26/2021 at 11:30 AM  [facility]

## 2021-05-26 NOTE — Plan of Care (Signed)

## 2021-05-26 NOTE — TOC Transition Note (Signed)
Transition of Care [facility]) - CM/SW Discharge Note   Patient Details  Name: Chessica Audia MRN: [id number] Date of Birth: [birth year]  Transition of Care [facility]) CM/SW Contact:  Bartholomew Crews, RN Phone Number: [phone] 05/26/2021, 2:31 PM   Clinical Narrative:     Patient ready to transition home with son. Notified by nursing of need for quad cane. Referral to [employer]. Son to provide transportation home. No further TOC needs identified.   Final next level of care: Home/Self Care Barriers to Discharge: No Barriers Identified   Patient Goals and CMS Choice Patient states their goals for this hospitalization and ongoing recovery are:: home with son   Choice offered to / list presented to : Adult Children  Discharge Placement                       Discharge Plan and Services                DME Arranged: Kasandra Knudsen DME Agency: [employer] Date DME Agency Contacted: 05/26/21 Time DME Agency Contacted: 1430 Representative spoke with at DME Agency: [place]: NA [place] Agency: NA        Social Determinants of Health ([place]) Interventions     Readmission Risk Interventions No flowsheet data found.

## 2021-05-26 NOTE — Progress Notes (Signed)
Pt has DC order after the CBC result came out. AVS was discussed to pt and son. All questions were answered. Son was requesting for cane with 4 legs, case manager was notified. Due to uncertainty of the delivery time, son requested to have the cane delivered to nurses station and we will him once the cane is available for pick up which the charge nurse and case manager agreed.

## 2021-05-26 NOTE — Discharge Instructions (Signed)
Follow with Primary MD Plotnikov, Evie Lacks, MD and your oncologist in 7 days, keep up your radiation oncology appointments as before unchanged  Get CBC, CMP, anemia panel, B12, TSH levels -  checked next visit within 1 week by Primary MD    Activity: As tolerated with Full fall precautions use walker/cane & assistance as needed  Disposition Home   Diet: Heart Healthy    Special Instructions: If you have smoked or chewed Tobacco  in the last 2 yrs please stop smoking, stop any regular Alcohol  and or any Recreational drug use.  On your next visit with your primary care physician please Get Medicines reviewed and adjusted.  Please request your Prim.MD to go over all Hospital Tests and Procedure/Radiological results at the follow up, please get all Hospital records sent to your Prim MD by signing hospital release before you go home.  If you experience worsening of your admission symptoms, develop shortness of breath, life threatening emergency, suicidal or homicidal thoughts you must seek medical attention immediately by calling 911 or calling your MD immediately  if symptoms less severe.  You Must read complete instructions/literature along with all the possible adverse reactions/side effects for all the Medicines you take and that have been prescribed to you. Take any new Medicines after you have completely understood and accpet all the possible adverse reactions/side effects.

## 2021-05-27 ENCOUNTER — Other Ambulatory Visit: Payer: Self-pay

## 2021-05-27 ENCOUNTER — Ambulatory Visit
Admission: RE | Admit: 2021-05-27 | Discharge: 2021-05-27 | Disposition: A | Discharge status: Home / Self Care | Payer: Medicare HMO | Source: Ambulatory Visit | Attending: Radiation Oncology | Admitting: Radiation Oncology

## 2021-05-27 ENCOUNTER — Ambulatory Visit: Payer: Medicare HMO

## 2021-05-27 DIAGNOSIS — Z510 Encounter for antineoplastic radiation therapy: Secondary | ICD-10-CM | POA: Diagnosis present

## 2021-05-27 DIAGNOSIS — C50812 Malignant neoplasm of overlapping sites of left female breast: Secondary | ICD-10-CM | POA: Diagnosis not present

## 2021-05-27 DIAGNOSIS — C7951 Secondary malignant neoplasm of bone: Secondary | ICD-10-CM | POA: Diagnosis not present

## 2021-05-27 DIAGNOSIS — Z171 Estrogen receptor negative status [ER-]: Secondary | ICD-10-CM | POA: Diagnosis not present

## 2021-05-28 ENCOUNTER — Encounter: Payer: Self-pay | Admitting: Internal Medicine

## 2021-05-28 ENCOUNTER — Ambulatory Visit
Admission: RE | Admit: 2021-05-28 | Discharge: 2021-05-28 | Disposition: A | Discharge status: Home / Self Care | Payer: Medicare HMO | Source: Ambulatory Visit | Attending: Radiation Oncology | Admitting: Radiation Oncology

## 2021-05-28 ENCOUNTER — Other Ambulatory Visit: Payer: Self-pay

## 2021-05-28 ENCOUNTER — Ambulatory Visit: Payer: Medicare HMO

## 2021-05-28 DIAGNOSIS — Z510 Encounter for antineoplastic radiation therapy: Secondary | ICD-10-CM | POA: Diagnosis not present

## 2021-05-28 DIAGNOSIS — C7951 Secondary malignant neoplasm of bone: Secondary | ICD-10-CM | POA: Diagnosis not present

## 2021-05-28 DIAGNOSIS — C50812 Malignant neoplasm of overlapping sites of left female breast: Secondary | ICD-10-CM | POA: Diagnosis not present

## 2021-05-28 DIAGNOSIS — Z171 Estrogen receptor negative status [ER-]: Secondary | ICD-10-CM | POA: Diagnosis not present

## 2021-05-29 ENCOUNTER — Ambulatory Visit: Payer: Medicare HMO

## 2021-05-29 ENCOUNTER — Ambulatory Visit
Admission: RE | Admit: 2021-05-29 | Discharge: 2021-05-29 | Disposition: A | Discharge status: Home / Self Care | Payer: Medicare HMO | Source: Ambulatory Visit | Attending: Radiation Oncology | Admitting: Radiation Oncology

## 2021-05-29 ENCOUNTER — Other Ambulatory Visit: Payer: Self-pay

## 2021-05-29 DIAGNOSIS — C50812 Malignant neoplasm of overlapping sites of left female breast: Secondary | ICD-10-CM | POA: Diagnosis not present

## 2021-05-29 DIAGNOSIS — Z171 Estrogen receptor negative status [ER-]: Secondary | ICD-10-CM | POA: Diagnosis not present

## 2021-05-29 DIAGNOSIS — C7951 Secondary malignant neoplasm of bone: Secondary | ICD-10-CM | POA: Diagnosis not present

## 2021-05-29 DIAGNOSIS — Z510 Encounter for antineoplastic radiation therapy: Secondary | ICD-10-CM | POA: Diagnosis not present

## 2021-05-30 ENCOUNTER — Ambulatory Visit: Payer: Medicare HMO

## 2021-05-30 ENCOUNTER — Ambulatory Visit
Admission: RE | Admit: 2021-05-30 | Discharge: 2021-05-30 | Disposition: A | Discharge status: Home / Self Care | Payer: Medicare HMO | Source: Ambulatory Visit | Attending: Radiation Oncology | Admitting: Radiation Oncology

## 2021-05-30 DIAGNOSIS — C7951 Secondary malignant neoplasm of bone: Secondary | ICD-10-CM | POA: Diagnosis not present

## 2021-05-30 DIAGNOSIS — Z510 Encounter for antineoplastic radiation therapy: Secondary | ICD-10-CM | POA: Diagnosis not present

## 2021-05-30 DIAGNOSIS — Z171 Estrogen receptor negative status [ER-]: Secondary | ICD-10-CM | POA: Diagnosis not present

## 2021-05-30 DIAGNOSIS — C50812 Malignant neoplasm of overlapping sites of left female breast: Secondary | ICD-10-CM | POA: Diagnosis not present

## 2021-05-31 ENCOUNTER — Ambulatory Visit: Payer: Medicare HMO

## 2021-05-31 ENCOUNTER — Ambulatory Visit
Admission: RE | Admit: 2021-05-31 | Discharge: 2021-05-31 | Disposition: A | Discharge status: Home / Self Care | Payer: Medicare HMO | Source: Ambulatory Visit | Attending: Radiation Oncology | Admitting: Radiation Oncology

## 2021-05-31 ENCOUNTER — Other Ambulatory Visit: Payer: Self-pay

## 2021-05-31 ENCOUNTER — Telehealth: Payer: Self-pay | Admitting: Pulmonary Disease

## 2021-05-31 ENCOUNTER — Encounter (HOSPITAL_COMMUNITY): Payer: Self-pay

## 2021-05-31 DIAGNOSIS — C7951 Secondary malignant neoplasm of bone: Secondary | ICD-10-CM | POA: Diagnosis not present

## 2021-05-31 DIAGNOSIS — C50812 Malignant neoplasm of overlapping sites of left female breast: Secondary | ICD-10-CM | POA: Diagnosis not present

## 2021-05-31 DIAGNOSIS — Z510 Encounter for antineoplastic radiation therapy: Secondary | ICD-10-CM | POA: Diagnosis not present

## 2021-05-31 DIAGNOSIS — Z171 Estrogen receptor negative status [ER-]: Secondary | ICD-10-CM | POA: Diagnosis not present

## 2021-05-31 NOTE — Telephone Encounter (Signed)
Patient's sone informing provider the form completed for independent assessment for personal care services  is the incorrect form  Caller states the correct form was faxed to provider this week (caller did not know the exact day)  Caller requesting a call back   Phone [phone] [facility]

## 2021-05-31 NOTE — Telephone Encounter (Signed)
Dr. Loanne Drilling, please advise on this.

## 2021-06-01 NOTE — Telephone Encounter (Signed)
Ok to cancel. 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

## 2021-06-03 ENCOUNTER — Ambulatory Visit: Payer: Medicare HMO

## 2021-06-03 ENCOUNTER — Ambulatory Visit
Admission: RE | Admit: 2021-06-03 | Discharge: 2021-06-03 | Disposition: A | Discharge status: Home / Self Care | Payer: Medicare HMO | Source: Ambulatory Visit | Attending: Radiation Oncology | Admitting: Radiation Oncology

## 2021-06-03 DIAGNOSIS — C7951 Secondary malignant neoplasm of bone: Secondary | ICD-10-CM | POA: Diagnosis not present

## 2021-06-03 DIAGNOSIS — Z171 Estrogen receptor negative status [ER-]: Secondary | ICD-10-CM | POA: Diagnosis not present

## 2021-06-03 DIAGNOSIS — Z510 Encounter for antineoplastic radiation therapy: Secondary | ICD-10-CM | POA: Diagnosis not present

## 2021-06-03 DIAGNOSIS — C50812 Malignant neoplasm of overlapping sites of left female breast: Secondary | ICD-10-CM | POA: Diagnosis not present

## 2021-06-03 NOTE — Telephone Encounter (Signed)
Called [facility] but they did not answer. Left message to call back.

## 2021-06-03 NOTE — Telephone Encounter (Signed)
Robin Estrada called back. He verbalized understanding. Nothing further needed at time of call.

## 2021-06-04 ENCOUNTER — Encounter: Payer: Self-pay | Admitting: Radiation Oncology

## 2021-06-04 ENCOUNTER — Other Ambulatory Visit: Payer: Self-pay

## 2021-06-04 ENCOUNTER — Telehealth: Payer: Self-pay

## 2021-06-04 ENCOUNTER — Ambulatory Visit
Admission: RE | Admit: 2021-06-04 | Discharge: 2021-06-04 | Disposition: A | Discharge status: Home / Self Care | Payer: Medicare HMO | Source: Ambulatory Visit | Attending: Radiation Oncology | Admitting: Radiation Oncology

## 2021-06-04 DIAGNOSIS — C7951 Secondary malignant neoplasm of bone: Secondary | ICD-10-CM | POA: Diagnosis not present

## 2021-06-04 DIAGNOSIS — Z510 Encounter for antineoplastic radiation therapy: Secondary | ICD-10-CM | POA: Diagnosis not present

## 2021-06-04 DIAGNOSIS — C801 Malignant (primary) neoplasm, unspecified: Secondary | ICD-10-CM | POA: Diagnosis not present

## 2021-06-04 DIAGNOSIS — C50812 Malignant neoplasm of overlapping sites of left female breast: Secondary | ICD-10-CM | POA: Diagnosis not present

## 2021-06-04 DIAGNOSIS — D649 Anemia, unspecified: Secondary | ICD-10-CM

## 2021-06-04 DIAGNOSIS — Z171 Estrogen receptor negative status [ER-]: Secondary | ICD-10-CM | POA: Diagnosis not present

## 2021-06-04 NOTE — Telephone Encounter (Signed)
Pt son calling in requesting to have Hbg checked to see what levels are since she had a blood transfusion on 1/16 but now is starting to have some weakness, lack of appetite, fatigue pt is only wanting to sleep.  Pt son CB [phone].  Pt scheduled a HOS F/U 1/30.

## 2021-06-04 NOTE — Progress Notes (Signed)
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  Intent: Palliative  Radiation Treatment Dates: 05/22/2021 through 06/04/2021 Site Technique Total Dose (Gy) Dose per Fx (Gy) Completed Fx Beam Energies  Thoracic Spine: Spine_T11 3D 30/30 3 10/10 15X   Narrative: The patient tolerated radiation therapy relatively well. She developed fatigue and continued to have some pain in her spine toward the end of therapy.   Plan: The patient will receive a call in about one month from the radiation oncology department. She will continue follow up with Dr. Burr Medico as well.   ________________________________________________    Carola Rhine, [facility]

## 2021-06-04 NOTE — Telephone Encounter (Signed)
Printed new forms... place in purple folder to complete

## 2021-06-05 ENCOUNTER — Inpatient Hospital Stay: Payer: Medicare HMO | Attending: Hematology

## 2021-06-05 ENCOUNTER — Encounter: Payer: Self-pay | Admitting: Hematology

## 2021-06-05 ENCOUNTER — Other Ambulatory Visit: Payer: Self-pay | Admitting: Internal Medicine

## 2021-06-05 ENCOUNTER — Inpatient Hospital Stay: Payer: Medicare HMO | Admitting: Hematology

## 2021-06-05 ENCOUNTER — Telehealth: Payer: Self-pay

## 2021-06-05 DIAGNOSIS — D500 Iron deficiency anemia secondary to blood loss (chronic): Secondary | ICD-10-CM

## 2021-06-05 DIAGNOSIS — C50919 Malignant neoplasm of unspecified site of unspecified female breast: Secondary | ICD-10-CM

## 2021-06-05 DIAGNOSIS — Z171 Estrogen receptor negative status [ER-]: Secondary | ICD-10-CM | POA: Diagnosis not present

## 2021-06-05 DIAGNOSIS — C50812 Malignant neoplasm of overlapping sites of left female breast: Secondary | ICD-10-CM | POA: Diagnosis not present

## 2021-06-05 LAB — COMPREHENSIVE METABOLIC PANEL
ALT: 5 U/L (ref 0–44)
AST: 12 U/L — ABNORMAL LOW (ref 15–41)
Albumin: 3.5 g/dL (ref 3.5–5.0)
Alkaline Phosphatase: 112 U/L (ref 38–126)
Anion gap: 12 (ref 5–15)
BUN: 9 mg/dL (ref 8–23)
CO2: 26 mmol/L (ref 22–32)
Calcium: 9.3 mg/dL (ref 8.9–10.3)
Chloride: 95 mmol/L — ABNORMAL LOW (ref 98–111)
Creatinine, Ser: 0.69 mg/dL (ref 0.44–1.00)
GFR, Estimated: >60 mL/min (ref >60)
Glucose, Bld: 135 mg/dL — ABNORMAL HIGH (ref 70–99)
Potassium: 3.4 mmol/L — ABNORMAL LOW (ref 3.5–5.1)
Sodium: 133 mmol/L — ABNORMAL LOW (ref 135–145)
Total Bilirubin: 0.8 mg/dL (ref 0.3–1.2)
Total Protein: 7.2 g/dL (ref 6.5–8.1)

## 2021-06-05 LAB — CBC WITH DIFFERENTIAL/PLATELET
Abs Immature Granulocytes: 0.05 10*3/uL (ref 0.00–0.07)
Basophils Absolute: 0.1 10*3/uL (ref 0.0–0.1)
Basophils Relative: 1 %
Eosinophils Absolute: 0.2 10*3/uL (ref 0.0–0.5)
Eosinophils Relative: 1 %
HCT: 35.5 % — ABNORMAL LOW (ref 36.0–46.0)
Hemoglobin: 11 g/dL — ABNORMAL LOW (ref 12.0–15.0)
Immature Granulocytes: 1 %
Lymphocytes Relative: 2 %
Lymphs Abs: 0.3 10*3/uL — ABNORMAL LOW (ref 0.7–4.0)
MCH: 23.5 pg — ABNORMAL LOW (ref 26.0–34.0)
MCHC: 31 g/dL (ref 30.0–36.0)
MCV: 75.7 fL — ABNORMAL LOW (ref 80.0–100.0)
Monocytes Absolute: 0.8 10*3/uL (ref 0.1–1.0)
Monocytes Relative: 8 %
Neutro Abs: 9.7 10*3/uL — ABNORMAL HIGH (ref 1.7–7.7)
Neutrophils Relative %: 87 %
Platelets: 260 10*3/uL (ref 150–400)
RBC: 4.69 MIL/uL (ref 3.87–5.11)
RDW: 23.1 % — ABNORMAL HIGH (ref 11.5–15.5)
WBC: 11 10*3/uL — ABNORMAL HIGH (ref 4.0–10.5)
nRBC: 0 % (ref 0.0–0.2)

## 2021-06-05 LAB — IRON AND IRON BINDING CAPACITY (CC-WL,HP ONLY)
Iron: 33 ug/dL (ref 28–170)
Saturation Ratios: 17 % (ref 10.4–31.8)
TIBC: 197 ug/dL — ABNORMAL LOW (ref 250–450)
UIBC: 164 ug/dL (ref 148–442)

## 2021-06-05 MED ORDER — ATENOLOL 100 MG PO TABS: 100.0000 mg | ORAL_TABLET | Freq: Two times a day (BID) | ORAL | 3 refills | Status: DC

## 2021-06-05 MED ORDER — HYDROCODONE-ACETAMINOPHEN 5-325 MG PO TABS: 1.0000 | ORAL_TABLET | Freq: Four times a day (QID) | ORAL | 0 refills | Status: DC | PRN

## 2021-06-05 MED ORDER — ONDANSETRON HCL 4 MG PO TABS: 4.0000 mg | ORAL_TABLET | Freq: Three times a day (TID) | ORAL | 2 refills | Status: DC | PRN

## 2021-06-05 NOTE — Telephone Encounter (Signed)
Spoke with pt and son via telephone regarding today's appt.  Dr Burr Medico asked to reschedule appt because the 2nd opinion from [facility] [phone] (Pathology Dept. Consultations/2nd Opinions) "Case# [id number] has not resulted.  Pt and son asked to be rescheduled on 06/24/2021 but pt wanted to keep lab appt scheduled for today 06/05/2021 d/t concerns regarding her Hbg.  Scheduled pt for follow-up plus lab on 06/24/2021.  Followed up with [facility] regarding 2nd Opinion on pathology smears on 06/05/2021.  Christine in the Pathology Dept. Consultations/2nd Opinions team stated they just received the smears on 06/04/2021 and are currently being immunostained today by the pathologist today 06/05/2021.  Altha Harm stated that hopefully they will have the read on Friday, 06/07/2021 and to follow-up with them to confirm.  Notified Dr. Burr Medico of the status.

## 2021-06-05 NOTE — Progress Notes (Signed)
x

## 2021-06-05 NOTE — Progress Notes (Signed)
Interpreter was asking about one-time $1000 Advertising account executive.  Advised she was in Radiation and could have applied through them.  I will reach out to her if and when she starts chemo and advised what is needed to apply. She verbalized understanding and communicated to patient.

## 2021-06-06 ENCOUNTER — Encounter (HOSPITAL_COMMUNITY): Payer: Self-pay

## 2021-06-06 LAB — CANCER ANTIGEN 27.29: CA 27.29: 35.2 U/mL (ref 0.0–38.6)

## 2021-06-06 NOTE — Telephone Encounter (Signed)
Called son [name]) he states mom had CBC check yesterday (Dr. Burr Medico). Want to know if MD can see results and if she need to do anything prior to visit 1/30.Marland KitchenChryl Heck

## 2021-06-06 NOTE — Telephone Encounter (Signed)
Okay CBC.  Thanks

## 2021-06-10 ENCOUNTER — Encounter: Payer: Self-pay | Admitting: Internal Medicine

## 2021-06-10 ENCOUNTER — Telehealth: Payer: Self-pay | Admitting: Radiation Oncology

## 2021-06-10 ENCOUNTER — Other Ambulatory Visit: Payer: Self-pay

## 2021-06-10 ENCOUNTER — Ambulatory Visit (INDEPENDENT_AMBULATORY_CARE_PROVIDER_SITE_OTHER): Payer: Medicare HMO | Admitting: Internal Medicine

## 2021-06-10 DIAGNOSIS — C50919 Malignant neoplasm of unspecified site of unspecified female breast: Secondary | ICD-10-CM | POA: Diagnosis not present

## 2021-06-10 DIAGNOSIS — I5033 Acute on chronic diastolic (congestive) heart failure: Secondary | ICD-10-CM

## 2021-06-10 DIAGNOSIS — Z171 Estrogen receptor negative status [ER-]: Secondary | ICD-10-CM

## 2021-06-10 DIAGNOSIS — K922 Gastrointestinal hemorrhage, unspecified: Secondary | ICD-10-CM | POA: Diagnosis not present

## 2021-06-10 DIAGNOSIS — K5904 Chronic idiopathic constipation: Secondary | ICD-10-CM

## 2021-06-10 DIAGNOSIS — D500 Iron deficiency anemia secondary to blood loss (chronic): Secondary | ICD-10-CM

## 2021-06-10 DIAGNOSIS — E876 Hypokalemia: Secondary | ICD-10-CM

## 2021-06-10 DIAGNOSIS — R5382 Chronic fatigue, unspecified: Secondary | ICD-10-CM

## 2021-06-10 DIAGNOSIS — C50812 Malignant neoplasm of overlapping sites of left female breast: Secondary | ICD-10-CM

## 2021-06-10 DIAGNOSIS — K5900 Constipation, unspecified: Secondary | ICD-10-CM | POA: Insufficient documentation

## 2021-06-10 DIAGNOSIS — M4854XA Collapsed vertebra, not elsewhere classified, thoracic region, initial encounter for fracture: Secondary | ICD-10-CM

## 2021-06-10 MED ORDER — ONDANSETRON HCL 4 MG PO TABS: 4.0000 mg | ORAL_TABLET | Freq: Three times a day (TID) | ORAL | 2 refills | Status: DC | PRN

## 2021-06-10 MED ORDER — SENNOSIDES-DOCUSATE SODIUM 8.6-50 MG PO TABS: 1.0000 | ORAL_TABLET | Freq: Every day | ORAL | 3 refills | Status: DC | PRN

## 2021-06-10 MED ORDER — FENTANYL 12 MCG/HR TD PT72: 1.0000 | MEDICATED_PATCH | TRANSDERMAL | 0 refills | Status: DC

## 2021-06-10 NOTE — Assessment & Plan Note (Signed)
We discussed opioids and constipation prophylaxis.  Will use a stool softener

## 2021-06-10 NOTE — Telephone Encounter (Signed)
I called and left a message for the patient's son as they had questions about the final results from [facility] which are now in her chart as an attachment to the 05/09/21 pathology timeline. I encouraged him to call back if they needed help getting a copy of this since I'm not sure if this comes through directly in MyChart since it is an attachment.

## 2021-06-10 NOTE — Assessment & Plan Note (Addendum)
Re-start Fentanyl patch -I emailed her prescription to the pharmacy Continue with Norco prn.  Zofran as needed Pt finished XRT  MRI IMPRESSION: 1. Multiple thoracic vertebral body metastases again noted. Pathologic fracture of the T11 vertebral body with up to 35% height loss. Bulging of the posterior vertebral body margin with overlying thin anterior epidural tumor extension results in moderate spinal canal stenosis at this level. No cord signal abnormality. 2. Right upper lobe lung cancer as seen on prior studies. 3. 2.1 cm cystic lesion in the pancreatic tail. Please see prior CT abdomen and pelvis report from 05/03/2021 for follow-up recommendations.   Electronically Signed   By: Titus Dubin M.D.   On: 05/15/2021 17:29

## 2021-06-10 NOTE — Assessment & Plan Note (Addendum)
°  Metastatic breast cancer. The patient is not convinced that her current cancer is the relapse of cancer that she had 4 years ago.  She wants to wait on the biopsy confirmation report from [place] before she agrees to further treatments. She will schedule a follow-up w/Oncology - Dr Burr Medico

## 2021-06-10 NOTE — Assessment & Plan Note (Signed)
Will obtain c-Met

## 2021-06-10 NOTE — Assessment & Plan Note (Signed)
Resolving

## 2021-06-10 NOTE — Assessment & Plan Note (Signed)
Status post IV iron infusion

## 2021-06-10 NOTE — Patient Instructions (Signed)
MRI IMPRESSION: 1. Multiple thoracic vertebral body metastases again noted. Pathologic fracture of the T11 vertebral body with up to 35% height loss. Bulging of the posterior vertebral body margin with overlying thin anterior epidural tumor extension results in moderate spinal canal stenosis at this level. No cord signal abnormality. 2. Right upper lobe lung cancer as seen on prior studies. 3. 2.1 cm cystic lesion in the pancreatic tail. Please see prior CT abdomen and pelvis report from 05/03/2021 for follow-up recommendations.     Electronically Signed   By: Titus Dubin M.D.   On: 05/15/2021 17:29

## 2021-06-10 NOTE — Assessment & Plan Note (Addendum)
Metastatic breast cancer. The patient is not convinced that her current cancer is the relapse of cancer that she had 4 years ago.  She wants to wait on the biopsy confirmation report from [place] before she agrees to further treatments. She will schedule a follow-up w/Oncology - Dr Burr Medico

## 2021-06-10 NOTE — Assessment & Plan Note (Addendum)
No relapse.  Hemoglobin is better.  Continue with pantoprazole

## 2021-06-10 NOTE — Assessment & Plan Note (Signed)
Fatigue and weakness likely due to metastatic breast cancer Discussed.

## 2021-06-10 NOTE — Progress Notes (Signed)
Subjective:  Patient ID: Robin Estrada, female    DOB: Aug 19, 1945  Age: 76 y.o. MRN: [id number]  CC: Follow-up ([facility] f/u)   HPI Robin Estrada presents for nausea, wt loss and bone mets. C/o abd pain Out of Fentanyl today  Per hx:  "Admit date: 05/02/2021  Discharge date: 05/26/2021   Admitted From: Home   Disposition:  Home     Recommendations for Outpatient Follow-up:    Follow up with PCP in 1-2 weeks   PCP Please obtain BMP/CBC, 2 view CXR in 1week,  (see Discharge instructions)    PCP Please follow up on the following pending results: Needs close outpatient GI, oncology and radiation oncology follow-up.  Monitor anemia panel, CBC and BMP closely     Home Health: refused   Equipment/Devices: refused  Consultations: GI, PCCM, oncology, radiation oncology, interventional radiology, neurosurgery Discharge Condition: Fair CODE STATUS: Full    Diet Recommendation: Heart Healthy    Diet Order                  Diet - low sodium heart healthy             Diet regular Room service appropriate? Yes; Fluid consistency: Thin  Diet effective now                            Chief Complaint  Patient presents with   Weakness      Brief history of present illness from the day of admission and additional interim summary     Patient is a 76 y.o. 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  She was found to have a Hb of 6.9 on admission.   CT imaging on admission  study showed a right upper lobe lung mass with mediastinal/hilar adenopathy, multiple lytic osseous bone metastases-most notably a dorsally expansile lesion in T11 vertebral body with moderate to severe canal stenosis.  Patient was subsequently admitted to the hospitalist  service-underwent GI work-up with showed a clean-based gastric ulcer-she was thought to have chronic blood loss and iron deficiency anemia.  She required several units of PRBC transfusion and iron supplementation.  She also underwent extensive work-up was eventually found to have recurrence of her prior malignant phyllodes tumor of her breast with widespread metastatic disease.  Oncology plans to start systemic therapy in the outpatient setting, plans were to proceed with radiation therapy treatment to the T11 lesion.     Radiation therapy was started on 1/11 at [place] demanded to be transferred back to [facility] extensive discussion with prior MD-Dr. Florinda Marker agreed to be transferred back to [facility] to continue radiation therapy-however on 1/13-she changed her mind-and subsequently requested to be discharged home, family planning to take her home on 1/15 after making necessary arrangements.  Patient is now planning to pursue radiation therapy in the outpatient setting.  See below for further details.     Significant Hospital events/procedures: Pathology/biopsy 12/22> admit to [facility] 12/24>> EGD: None with bleeding clean-based gastric ulcer. 12/27>> bronchoscopy with biopsy 12/29>> Colonoscopy-2 polyps/diverticulosis-s/p polypectomy 01/03>> CT-guided core biopsy of right lung mass 01/11>> radiation therapy treatment started-but demanded to be transferred back to [facility] 01/13>> refused transfer to [facility] discharge home on 1/15.   Pathology/biopsy 12/24>> duodenum/stomach biopsy: No malignancy-H. pylori negative. 12/29>> colon/cecum  bx: Poorly differentiated malignancy with sarcomatoid/epithelioid features. 12/27>> BAL/FNAC of lymph node: Negative for malignancy 01/03>> right lung biopsy: Poorly differentiated malignancy with sarcomatoid/epithelioid features                                                                      Hospital Course      Malignant phyllodes  tumor of breast ([place])- (present on admission) Appears to have recurrence of malignant phyllodes tumor-with mets to her lung, colon, T-spine and possible liver.   Evaluated by radiation oncology-with plans to continue serial radiation therapy to her T-spine lesion.  Patient refused to be transferred to [facility] on 1/13-she plans to go home on 1/15-and pursue continued outpatient palliative radiation therapy.  She already has scheduled appointments with radiation therapy-her family will take her to the appointments.  She was also evaluated by medical oncology (Dr. Erie Noe will see her in her office on follow-up for systemic therapy.  Has follow-up with Dr. Burr Medico on 1/25.  Son updated in detail on 05/26/2021 agrees with plan of home discharge.   Pathologic compression fracture of thoracic vertebra ([place])- (present on admission) Pain controlled with current narcotic regimen-evaluated by neurosurgery-Dr. Ellene Route on 1/5 with recommendations for palliative radiation.  She will pursue radiation therapy in the outpatient setting.  Patient remains neurologically intact without any signs of cord compression.   Acute gastric ulcer without hemorrhage or perforation- (present on admission) --EGD 12/24: 4 cm HH.  Clean-based, non-bleeding GU, erosive gastritis. Biopsy was negative for malignancy and H. pylori.  GI recommending Protonix twice daily for 8 weeks, followed by daily indefinitely.  Stable H&H at the time of discharge.   GI bleed- (present on admission) -Likely low-grade-GI bleeding causing chronic blood loss and iron deficiency anemia.  EGD on 12/24 showed clean-based gastric ulcer.  GI MD recommending-Protonix 40 mg twice daily x8 weeks, and then decrease to 40 mg daily indefinitely.   Chronic blood loss anemia- (present on admission) Due to chronic GI bleeding from gastric ulcer.  Hemoglobin stable after 2 units of PRBC transfusion and IV iron supplementation.  Avoid NSAIDs/aspirin. Marland Kitchen  PCP/oncology to  monitor hemoglobin in the outpatient setting.   Iron deficiency anemia- (present on admission) S/p IV iron-start oral iron supplementation.  Suspect etiology to be chronic blood loss from her gastric ulcer.  Will need iron supplementation on discharge    Borderline vitamin B12 deficiency: Continue supplementation   Acute on chronic diastolic CHF (congestive heart failure) ([place]) Euvolemic-no longer on diuretics.   Essential hypertension- (present on admission) BP stable-continue atenolol.   Cystic mass of pancreas- (present on admission)  CA 19-9 level normal. Per GI note on 1/9-this cystic lesion is low priority-MRI/MRCP can be considered in the future at the discretion of oncology.    Aortic atherosclerosis (HCC) --no treatment indicated   Emphysema of lung ([place]) --asymptomatic   Liver disease, unspecified- (present on admission) CT abdomen on 12/23 showed coarse heterogeneous liver enhancement-infiltrative appearance-suspicion for hepatic metastatic disease-we will defer further to oncology.   History of breast cancer S/p mastectomy May 2019-subsequently completed radiation therapy on 01/08/2018.  She used to follow with Dr. Jana Hakim.  She now has recurrence of her phyllodes tumor.     Discharge diagnosis  Principal Problem:   Malignant phyllodes tumor of breast ([place]) Active Problems:   Essential hypertension   History of breast cancer   GI bleed   Chronic blood loss anemia   Pathologic compression fracture of thoracic vertebra (HCC)   Cystic mass of pancreas   Acute on chronic diastolic CHF (congestive heart failure) (HCC)   Iron deficiency anemia   Acute gastric ulcer without hemorrhage or perforation   Aortic atherosclerosis (HCC)   Emphysema of lung (HCC)   Liver disease, unspecified"    Outpatient Medications Prior to Visit  Medication Sig Dispense Refill   acetaminophen (TYLENOL) 500 MG tablet Take 1 tablet (500 mg total) by mouth every 8 (eight) hours  as needed for headache. 30 tablet 0   atenolol (TENORMIN) 100 MG tablet Take 1 tablet (100 mg total) by mouth 2 (two) times daily. 180 tablet 3   cholecalciferol (VITAMIN D) 1000 units tablet Take 1 tablet (1,000 Units total) by mouth daily. 100 tablet 3   HYDROcodone-acetaminophen (NORCO/VICODIN) 5-325 MG tablet Take 1 tablet by mouth every 6 (six) hours as needed for moderate pain or severe pain. 120 tablet 0   Multiple Vitamin (MULTIVITAMIN WITH MINERALS) TABS tablet Take 1 tablet by mouth daily. 30 tablet 0   pantoprazole (PROTONIX) 40 MG tablet Take 1 tablet (40 mg total) by mouth 2 (two) times daily before a meal. 60 tablet 2   vitamin B-12 1000 MCG tablet Take 1 tablet (1,000 mcg total) by mouth daily. 30 tablet 0   fentaNYL (DURAGESIC) 12 MCG/HR Place 1 patch onto the skin every 3 (three) days. 5 patch 0   ferrous sulfate 325 (65 FE) MG tablet Take 1 tablet (325 mg total) by mouth 2 (two) times daily with a meal. 60 tablet 0   ondansetron (ZOFRAN) 4 MG tablet Take 1 tablet (4 mg total) by mouth every 8 (eight) hours as needed for nausea. 20 tablet 2   triamterene-hydrochlorothiazide (MAXZIDE-25) 37.5-25 MG tablet Take 1 tablet by mouth daily. 90 tablet 3   vitamin C (ASCORBIC ACID) 500 MG tablet Take 1 tablet (500 mg total) by mouth every other day.     No facility-administered medications prior to visit.    ROS: Review of Systems  Constitutional:  Positive for fatigue. Negative for activity change, appetite change, chills and unexpected weight change.  HENT:  Negative for congestion, mouth sores, postnasal drip and sinus pressure.   Eyes:  Negative for visual disturbance.  Respiratory:  Negative for cough and chest tightness.   Gastrointestinal:  Positive for abdominal pain and nausea. Negative for blood in stool and vomiting.  Genitourinary:  Negative for difficulty urinating, dysuria, frequency, vaginal bleeding and vaginal pain.  Musculoskeletal:  Positive for arthralgias, back  pain, gait problem and neck pain.  Skin:  Negative for color change, pallor and rash.  Neurological:  Positive for weakness. Negative for dizziness, tremors, numbness and headaches.  Psychiatric/Behavioral:  Negative for confusion and sleep disturbance.    Objective:  BP (!) 160/72 (BP Location: Left Arm)    Pulse 84    Temp 97.9 F (36.6 C) (Oral)    Ht 5' (1.524 m)    Wt 120 lb 12.8 oz (54.8 kg)    SpO2 97%    BMI 23.59 kg/m   BP Readings from Last 3 Encounters:  06/10/21 (!) 160/72  05/26/21 (!) 169/67  11/29/20 (!) 168/78    Wt Readings from Last 3 Encounters:  06/10/21 120 lb 12.8 oz (54.8 kg)  05/09/21 147 lb 14.9 oz (67.1 kg)  11/29/20 171 lb (77.6 kg)    Physical Exam Constitutional:      General: She is not in acute distress.    Appearance: She is well-developed. She is ill-appearing. She is not toxic-appearing.  HENT:     Head: Normocephalic.     Right Ear: External ear normal.     Left Ear: External ear normal.     Nose: Nose normal.  Eyes:     General:        Right eye: No discharge.        Left eye: No discharge.     Conjunctiva/sclera: Conjunctivae normal.     Pupils: Pupils are equal, round, and reactive to light.  Neck:     Thyroid: No thyromegaly.     Vascular: No JVD.     Trachea: No tracheal deviation.  Cardiovascular:     Rate and Rhythm: Normal rate and regular rhythm.     Heart sounds: Normal heart sounds.  Pulmonary:     Effort: No respiratory distress.     Breath sounds: No stridor. No wheezing.  Abdominal:     General: Bowel sounds are normal. There is no distension.     Palpations: Abdomen is soft. There is no mass.     Tenderness: There is no abdominal tenderness. There is no guarding or rebound.  Musculoskeletal:        General: Tenderness present.     Cervical back: Normal range of motion and neck supple. No rigidity.  Lymphadenopathy:     Cervical: No cervical adenopathy.  Skin:    Findings: No erythema or rash.  Neurological:      Cranial Nerves: No cranial nerve deficit.     Motor: Weakness present. No abnormal muscle tone.     Coordination: Coordination normal.     Deep Tendon Reflexes: Reflexes normal.  Psychiatric:        Behavior: Behavior normal.        Thought Content: Thought content normal.        Judgment: Judgment normal.  She appears tired and sad. She appears chronically ill    A total time of 47 minutes was spent preparing to see the patient, reviewing tests, x-rays, operative reports and other medical records.  Also, obtaining history and performing comprehensive physical exam.  Additionally, counseling the patient regarding the above listed issues-metastatic breast cancer, spinal mets, constipation, pain management.   Finally, documenting clinical information in the health records, coordination of care, educating the patient. It is a complex case.  Lab Results  Component Value Date   WBC 11.0 (H) 06/05/2021   HGB 11.0 (L) 06/05/2021   HCT 35.5 (L) 06/05/2021   PLT 260 06/05/2021   GLUCOSE 135 (H) 06/05/2021   CHOL 270 (H) 08/02/2020   TRIG 173.0 (H) 08/02/2020   HDL 53.50 08/02/2020   LDLCALC 182 (H) 08/02/2020   ALT 5 06/05/2021   AST 12 (L) 06/05/2021   NA 133 (L) 06/05/2021   K 3.4 (L) 06/05/2021   CL 95 (L) 06/05/2021   CREATININE 0.69 06/05/2021   BUN 9 06/05/2021   CO2 26 06/05/2021   TSH 3.21 04/01/2021   INR 0.98 09/14/2017   HGBA1C 6.4 08/30/2020    No results found.  Assessment & Plan:   Problem List Items Addressed This Visit     Acute on chronic diastolic CHF (congestive heart failure) ([place])    Resolving      Relevant Orders  Comprehensive metabolic panel   CBC with Differential/Platelet   Lipase   Urinalysis   Constipation    We discussed opioids and constipation prophylaxis.  Will use a stool softener      Fatigue    Fatigue and weakness likely due to metastatic breast cancer Discussed.      GI bleed    No relapse.  Hemoglobin is better.  Continue  with pantoprazole      Hypokalemia    Will obtain c-Met      Iron deficiency anemia    Status post IV iron infusion      Malignant neoplasm of overlapping sites of left breast in female, estrogen receptor negative ([place])    Metastatic breast cancer. The patient is not convinced that her current cancer is the relapse of cancer that she had 4 years ago.  She wants to wait on the biopsy confirmation report from [place] before she agrees to further treatments. She will schedule a follow-up w/Oncology - Dr Burr Medico      Relevant Medications   ondansetron (ZOFRAN) 4 MG tablet   Other Relevant Orders   Comprehensive metabolic panel   CBC with Differential/Platelet   Lipase   Urinalysis   Malignant phyllodes tumor of breast ([name])     Metastatic breast cancer. The patient is not convinced that her current cancer is the relapse of cancer that she had 4 years ago.  She wants to wait on the biopsy confirmation report from [place] before she agrees to further treatments. She will schedule a follow-up w/Oncology - Dr Burr Medico      Relevant Medications   ondansetron (ZOFRAN) 4 MG tablet   Pathologic compression fracture of thoracic vertebra (HCC)    Re-start Fentanyl patch -I emailed her prescription to the pharmacy Continue with Norco prn.  Zofran as needed Pt finished XRT  MRI IMPRESSION: 1. Multiple thoracic vertebral body metastases again noted. Pathologic fracture of the T11 vertebral body with up to 35% height loss. Bulging of the posterior vertebral body margin with overlying thin anterior epidural tumor extension results in moderate spinal canal stenosis at this level. No cord signal abnormality. 2. Right upper lobe lung cancer as seen on prior studies. 3. 2.1 cm cystic lesion in the pancreatic tail. Please see prior CT abdomen and pelvis report from 05/03/2021 for follow-up recommendations.     Electronically Signed   By: Titus Dubin M.D.   On: 05/15/2021 17:29      Relevant  Orders   Comprehensive metabolic panel   CBC with Differential/Platelet   Lipase   Urinalysis      Meds ordered this encounter  Medications   fentaNYL (DURAGESIC) 12 MCG/HR    Sig: Place 1 patch onto the skin every 3 (three) days.    Dispense:  10 patch    Refill:  0   ondansetron (ZOFRAN) 4 MG tablet    Sig: Take 1 tablet (4 mg total) by mouth every 8 (eight) hours as needed for nausea.    Dispense:  20 tablet    Refill:  2   senna-docusate (SENOKOT-S) 8.6-50 MG tablet    Sig: Take 1-2 tablets by mouth daily as needed for mild constipation.    Dispense:  100 tablet    Refill:  3      Follow-up: Return in about 4 weeks (around 07/08/2021) for a follow-up visit.  Walker Kehr, MD

## 2021-06-13 ENCOUNTER — Other Ambulatory Visit: Payer: Self-pay

## 2021-06-13 LAB — FERRITIN: Ferritin: 2530 ng/mL — ABNORMAL HIGH (ref 11–307)

## 2021-06-13 NOTE — Progress Notes (Signed)
Pt's Ferritin is 2,530 obtained on 06/05/2021.

## 2021-06-22 DIAGNOSIS — C50919 Malignant neoplasm of unspecified site of unspecified female breast: Secondary | ICD-10-CM | POA: Insufficient documentation

## 2021-06-22 NOTE — Progress Notes (Signed)
START ON PATHWAY REGIMEN - Breast     A cycle is every 28 days (3 weeks on and 1 week off):     Paclitaxel   **Always confirm dose/schedule in your pharmacy ordering system**  Patient Characteristics: Distant Metastases or Locoregional Recurrent Disease - Unresected or Locally Advanced Unresectable Disease Progressing after Neoadjuvant and Local Therapies, HER2 Low/Negative/Unknown, ER Negative/Unknown, Chemotherapy, HER2 Negative/Unknown, First Line,  ER Negative, PD-L1 Expression Negative/Unknown Therapeutic Status: Distant Metastases HER2 Status: Negative (-) ER Status: Negative (-) PR Status: Negative (-) Therapy Approach Indicated: Standard Chemotherapy/Endocrine Therapy Line of Therapy: First Line PD-L1 Expression Status: PD-L1 Negative Intent of Therapy: Non-Curative / Palliative Intent, Discussed with Patient

## 2021-06-24 ENCOUNTER — Inpatient Hospital Stay: Payer: Medicare HMO | Admitting: Hematology

## 2021-06-24 ENCOUNTER — Other Ambulatory Visit: Payer: Medicare HMO

## 2021-06-24 ENCOUNTER — Telehealth: Payer: Self-pay

## 2021-06-24 ENCOUNTER — Inpatient Hospital Stay (HOSPITAL_BASED_OUTPATIENT_CLINIC_OR_DEPARTMENT_OTHER): Payer: Medicare HMO | Admitting: Hematology

## 2021-06-24 ENCOUNTER — Telehealth: Payer: Self-pay | Admitting: Hematology

## 2021-06-24 ENCOUNTER — Inpatient Hospital Stay: Payer: Medicare HMO

## 2021-06-24 ENCOUNTER — Other Ambulatory Visit: Payer: Self-pay

## 2021-06-24 DIAGNOSIS — C50919 Malignant neoplasm of unspecified site of unspecified female breast: Secondary | ICD-10-CM

## 2021-06-24 NOTE — Telephone Encounter (Signed)
Spoke with Altha Harm at [facility] [phone] regarding 2nd opinion pathology smear results for Case# [id number].  Christine faxed results to Dr. Ernestina Penna office and fax copy given to Dr. Burr Medico and other sent to be scanned in pt's chart.

## 2021-06-24 NOTE — Telephone Encounter (Signed)
Spoke with pt's son via telephone regarding pt's mom's appt today with Dr. Burr Medico.  Informed pt's son that Dr. Burr Medico would like to reschedule his mom's appt d/t Dr. Burr Medico has now received the pathology report for the 2nd Opinion smear as of yet from [facility].  Pt's son verbalized understanding and had no additional question or concerns at this time.  Sent scheduling message to Scheduling Team to contact pt's and pt's son to reschedule.

## 2021-06-24 NOTE — Telephone Encounter (Signed)
Sch per 2/13 inbasket , pt son and pt aware

## 2021-06-24 NOTE — Telephone Encounter (Signed)
Spoke with Landry Mellow [phone]) in [facility] Pathology Dept regarding [facility] report for 2nd Opinion report for [facility]'s smear.  Varney Biles stated she will contact [facility] to get the report scanned into Epic.

## 2021-06-27 ENCOUNTER — Ambulatory Visit: Payer: Medicare HMO | Admitting: Internal Medicine

## 2021-06-28 ENCOUNTER — Inpatient Hospital Stay (HOSPITAL_COMMUNITY)
Admission: EM | Admit: 2021-06-28 | Discharge: 2021-07-25 | DRG: 329 | Disposition: A | Discharge status: Hospice | Payer: Medicare HMO | Attending: Family Medicine | Admitting: Family Medicine

## 2021-06-28 ENCOUNTER — Ambulatory Visit (INDEPENDENT_AMBULATORY_CARE_PROVIDER_SITE_OTHER): Payer: Medicare HMO | Admitting: Internal Medicine

## 2021-06-28 ENCOUNTER — Encounter (HOSPITAL_COMMUNITY): Payer: Self-pay

## 2021-06-28 ENCOUNTER — Other Ambulatory Visit: Payer: Medicare HMO

## 2021-06-28 ENCOUNTER — Encounter: Payer: Self-pay | Admitting: Internal Medicine

## 2021-06-28 ENCOUNTER — Other Ambulatory Visit: Payer: Self-pay

## 2021-06-28 ENCOUNTER — Emergency Department (HOSPITAL_COMMUNITY): Payer: Medicare HMO

## 2021-06-28 DIAGNOSIS — C7951 Secondary malignant neoplasm of bone: Secondary | ICD-10-CM | POA: Diagnosis not present

## 2021-06-28 DIAGNOSIS — J439 Emphysema, unspecified: Secondary | ICD-10-CM | POA: Diagnosis present

## 2021-06-28 DIAGNOSIS — I97121 Postprocedural cardiac arrest following other surgery: Secondary | ICD-10-CM | POA: Diagnosis not present

## 2021-06-28 DIAGNOSIS — J189 Pneumonia, unspecified organism: Secondary | ICD-10-CM | POA: Diagnosis not present

## 2021-06-28 DIAGNOSIS — K561 Intussusception: Secondary | ICD-10-CM | POA: Diagnosis not present

## 2021-06-28 DIAGNOSIS — J9811 Atelectasis: Secondary | ICD-10-CM | POA: Diagnosis present

## 2021-06-28 DIAGNOSIS — D509 Iron deficiency anemia, unspecified: Secondary | ICD-10-CM | POA: Diagnosis not present

## 2021-06-28 DIAGNOSIS — D649 Anemia, unspecified: Secondary | ICD-10-CM | POA: Diagnosis not present

## 2021-06-28 DIAGNOSIS — C494 Malignant neoplasm of connective and soft tissue of abdomen: Secondary | ICD-10-CM | POA: Diagnosis not present

## 2021-06-28 DIAGNOSIS — E871 Hypo-osmolality and hyponatremia: Secondary | ICD-10-CM | POA: Diagnosis not present

## 2021-06-28 DIAGNOSIS — J90 Pleural effusion, not elsewhere classified: Secondary | ICD-10-CM | POA: Diagnosis not present

## 2021-06-28 DIAGNOSIS — R0603 Acute respiratory distress: Secondary | ICD-10-CM | POA: Diagnosis not present

## 2021-06-28 DIAGNOSIS — M8448XA Pathological fracture, other site, initial encounter for fracture: Secondary | ICD-10-CM | POA: Diagnosis not present

## 2021-06-28 DIAGNOSIS — I82442 Acute embolism and thrombosis of left tibial vein: Secondary | ICD-10-CM | POA: Diagnosis not present

## 2021-06-28 DIAGNOSIS — Z823 Family history of stroke: Secondary | ICD-10-CM

## 2021-06-28 DIAGNOSIS — R609 Edema, unspecified: Secondary | ICD-10-CM | POA: Diagnosis not present

## 2021-06-28 DIAGNOSIS — K869 Disease of pancreas, unspecified: Secondary | ICD-10-CM | POA: Diagnosis present

## 2021-06-28 DIAGNOSIS — C50919 Malignant neoplasm of unspecified site of unspecified female breast: Secondary | ICD-10-CM

## 2021-06-28 DIAGNOSIS — C799 Secondary malignant neoplasm of unspecified site: Secondary | ICD-10-CM | POA: Diagnosis not present

## 2021-06-28 DIAGNOSIS — Z79899 Other long term (current) drug therapy: Secondary | ICD-10-CM

## 2021-06-28 DIAGNOSIS — R0602 Shortness of breath: Secondary | ICD-10-CM

## 2021-06-28 DIAGNOSIS — J9601 Acute respiratory failure with hypoxia: Secondary | ICD-10-CM | POA: Diagnosis not present

## 2021-06-28 DIAGNOSIS — E43 Unspecified severe protein-calorie malnutrition: Secondary | ICD-10-CM | POA: Diagnosis not present

## 2021-06-28 DIAGNOSIS — J910 Malignant pleural effusion: Secondary | ICD-10-CM | POA: Diagnosis not present

## 2021-06-28 DIAGNOSIS — C785 Secondary malignant neoplasm of large intestine and rectum: Secondary | ICD-10-CM | POA: Diagnosis present

## 2021-06-28 DIAGNOSIS — E559 Vitamin D deficiency, unspecified: Secondary | ICD-10-CM | POA: Diagnosis present

## 2021-06-28 DIAGNOSIS — Z803 Family history of malignant neoplasm of breast: Secondary | ICD-10-CM

## 2021-06-28 DIAGNOSIS — Z6821 Body mass index (BMI) 21.0-21.9, adult: Secondary | ICD-10-CM

## 2021-06-28 DIAGNOSIS — E785 Hyperlipidemia, unspecified: Secondary | ICD-10-CM | POA: Diagnosis present

## 2021-06-28 DIAGNOSIS — J984 Other disorders of lung: Secondary | ICD-10-CM | POA: Diagnosis not present

## 2021-06-28 DIAGNOSIS — C7801 Secondary malignant neoplasm of right lung: Secondary | ICD-10-CM | POA: Diagnosis present

## 2021-06-28 DIAGNOSIS — R1084 Generalized abdominal pain: Secondary | ICD-10-CM

## 2021-06-28 DIAGNOSIS — I2119 ST elevation (STEMI) myocardial infarction involving other coronary artery of inferior wall: Secondary | ICD-10-CM | POA: Diagnosis not present

## 2021-06-28 DIAGNOSIS — Z171 Estrogen receptor negative status [ER-]: Secondary | ICD-10-CM | POA: Diagnosis not present

## 2021-06-28 DIAGNOSIS — W19XXXA Unspecified fall, initial encounter: Secondary | ICD-10-CM

## 2021-06-28 DIAGNOSIS — C50812 Malignant neoplasm of overlapping sites of left female breast: Secondary | ICD-10-CM | POA: Diagnosis present

## 2021-06-28 DIAGNOSIS — Z923 Personal history of irradiation: Secondary | ICD-10-CM

## 2021-06-28 DIAGNOSIS — R600 Localized edema: Secondary | ICD-10-CM | POA: Diagnosis not present

## 2021-06-28 DIAGNOSIS — E876 Hypokalemia: Secondary | ICD-10-CM | POA: Diagnosis not present

## 2021-06-28 DIAGNOSIS — I255 Ischemic cardiomyopathy: Secondary | ICD-10-CM | POA: Diagnosis not present

## 2021-06-28 DIAGNOSIS — C7931 Secondary malignant neoplasm of brain: Secondary | ICD-10-CM | POA: Diagnosis present

## 2021-06-28 DIAGNOSIS — Z452 Encounter for adjustment and management of vascular access device: Secondary | ICD-10-CM | POA: Diagnosis not present

## 2021-06-28 DIAGNOSIS — M1611 Unilateral primary osteoarthritis, right hip: Secondary | ICD-10-CM | POA: Diagnosis not present

## 2021-06-28 DIAGNOSIS — C172 Malignant neoplasm of ileum: Secondary | ICD-10-CM | POA: Diagnosis not present

## 2021-06-28 DIAGNOSIS — R5383 Other fatigue: Secondary | ICD-10-CM | POA: Diagnosis not present

## 2021-06-28 DIAGNOSIS — G893 Neoplasm related pain (acute) (chronic): Secondary | ICD-10-CM | POA: Diagnosis present

## 2021-06-28 DIAGNOSIS — R112 Nausea with vomiting, unspecified: Secondary | ICD-10-CM | POA: Diagnosis not present

## 2021-06-28 DIAGNOSIS — D62 Acute posthemorrhagic anemia: Secondary | ICD-10-CM | POA: Diagnosis not present

## 2021-06-28 DIAGNOSIS — L89156 Pressure-induced deep tissue damage of sacral region: Secondary | ICD-10-CM | POA: Diagnosis present

## 2021-06-28 DIAGNOSIS — Z66 Do not resuscitate: Secondary | ICD-10-CM | POA: Diagnosis not present

## 2021-06-28 DIAGNOSIS — C7800 Secondary malignant neoplasm of unspecified lung: Secondary | ICD-10-CM | POA: Diagnosis not present

## 2021-06-28 DIAGNOSIS — R109 Unspecified abdominal pain: Secondary | ICD-10-CM | POA: Diagnosis not present

## 2021-06-28 DIAGNOSIS — I4901 Ventricular fibrillation: Secondary | ICD-10-CM | POA: Diagnosis not present

## 2021-06-28 DIAGNOSIS — J918 Pleural effusion in other conditions classified elsewhere: Secondary | ICD-10-CM | POA: Diagnosis present

## 2021-06-28 DIAGNOSIS — I700 Atherosclerosis of aorta: Secondary | ICD-10-CM | POA: Diagnosis present

## 2021-06-28 DIAGNOSIS — Z515 Encounter for palliative care: Secondary | ICD-10-CM | POA: Diagnosis not present

## 2021-06-28 DIAGNOSIS — R911 Solitary pulmonary nodule: Secondary | ICD-10-CM | POA: Diagnosis not present

## 2021-06-28 DIAGNOSIS — D849 Immunodeficiency, unspecified: Secondary | ICD-10-CM | POA: Diagnosis not present

## 2021-06-28 DIAGNOSIS — M5126 Other intervertebral disc displacement, lumbar region: Secondary | ICD-10-CM | POA: Diagnosis not present

## 2021-06-28 DIAGNOSIS — R079 Chest pain, unspecified: Secondary | ICD-10-CM | POA: Diagnosis not present

## 2021-06-28 DIAGNOSIS — R531 Weakness: Secondary | ICD-10-CM | POA: Diagnosis not present

## 2021-06-28 DIAGNOSIS — R9431 Abnormal electrocardiogram [ECG] [EKG]: Secondary | ICD-10-CM | POA: Diagnosis not present

## 2021-06-28 DIAGNOSIS — R627 Adult failure to thrive: Secondary | ICD-10-CM | POA: Diagnosis present

## 2021-06-28 DIAGNOSIS — Z7189 Other specified counseling: Secondary | ICD-10-CM | POA: Diagnosis not present

## 2021-06-28 DIAGNOSIS — I4729 Other ventricular tachycardia: Secondary | ICD-10-CM | POA: Diagnosis not present

## 2021-06-28 DIAGNOSIS — J939 Pneumothorax, unspecified: Secondary | ICD-10-CM | POA: Diagnosis not present

## 2021-06-28 DIAGNOSIS — D638 Anemia in other chronic diseases classified elsewhere: Secondary | ICD-10-CM | POA: Diagnosis present

## 2021-06-28 DIAGNOSIS — I213 ST elevation (STEMI) myocardial infarction of unspecified site: Secondary | ICD-10-CM | POA: Diagnosis not present

## 2021-06-28 DIAGNOSIS — Z853 Personal history of malignant neoplasm of breast: Secondary | ICD-10-CM

## 2021-06-28 DIAGNOSIS — C801 Malignant (primary) neoplasm, unspecified: Secondary | ICD-10-CM | POA: Diagnosis not present

## 2021-06-28 DIAGNOSIS — Y836 Removal of other organ (partial) (total) as the cause of abnormal reaction of the patient, or of later complication, without mention of misadventure at the time of the procedure: Secondary | ICD-10-CM | POA: Diagnosis not present

## 2021-06-28 DIAGNOSIS — K7460 Unspecified cirrhosis of liver: Secondary | ICD-10-CM | POA: Diagnosis not present

## 2021-06-28 DIAGNOSIS — Z888 Allergy status to other drugs, medicaments and biological substances status: Secondary | ICD-10-CM

## 2021-06-28 DIAGNOSIS — M47817 Spondylosis without myelopathy or radiculopathy, lumbosacral region: Secondary | ICD-10-CM | POA: Diagnosis not present

## 2021-06-28 DIAGNOSIS — C772 Secondary and unspecified malignant neoplasm of intra-abdominal lymph nodes: Secondary | ICD-10-CM | POA: Diagnosis not present

## 2021-06-28 DIAGNOSIS — R918 Other nonspecific abnormal finding of lung field: Secondary | ICD-10-CM | POA: Diagnosis not present

## 2021-06-28 DIAGNOSIS — J449 Chronic obstructive pulmonary disease, unspecified: Secondary | ICD-10-CM | POA: Diagnosis not present

## 2021-06-28 DIAGNOSIS — M48061 Spinal stenosis, lumbar region without neurogenic claudication: Secondary | ICD-10-CM | POA: Diagnosis not present

## 2021-06-28 DIAGNOSIS — I890 Lymphedema, not elsewhere classified: Secondary | ICD-10-CM | POA: Diagnosis present

## 2021-06-28 DIAGNOSIS — M25561 Pain in right knee: Secondary | ICD-10-CM | POA: Diagnosis not present

## 2021-06-28 DIAGNOSIS — I10 Essential (primary) hypertension: Secondary | ICD-10-CM | POA: Diagnosis not present

## 2021-06-28 DIAGNOSIS — M8458XA Pathological fracture in neoplastic disease, other specified site, initial encounter for fracture: Secondary | ICD-10-CM | POA: Diagnosis present

## 2021-06-28 DIAGNOSIS — Z20822 Contact with and (suspected) exposure to covid-19: Secondary | ICD-10-CM | POA: Diagnosis not present

## 2021-06-28 DIAGNOSIS — Z9012 Acquired absence of left breast and nipple: Secondary | ICD-10-CM

## 2021-06-28 DIAGNOSIS — M4804 Spinal stenosis, thoracic region: Secondary | ICD-10-CM | POA: Diagnosis not present

## 2021-06-28 LAB — CBC WITH DIFFERENTIAL/PLATELET
Abs Immature Granulocytes: 0.3 10*3/uL — ABNORMAL HIGH (ref 0.00–0.07)
Basophils Absolute: 0.1 10*3/uL (ref 0.0–0.1)
Basophils Relative: 0 %
Eosinophils Absolute: 0 10*3/uL (ref 0.0–0.5)
Eosinophils Relative: 0 %
HCT: 30.8 % — ABNORMAL LOW (ref 36.0–46.0)
Hemoglobin: 9.3 g/dL — ABNORMAL LOW (ref 12.0–15.0)
Immature Granulocytes: 1 %
Lymphocytes Relative: 3 %
Lymphs Abs: 0.7 10*3/uL (ref 0.7–4.0)
MCH: 24.9 pg — ABNORMAL LOW (ref 26.0–34.0)
MCHC: 30.2 g/dL (ref 30.0–36.0)
MCV: 82.4 fL (ref 80.0–100.0)
Monocytes Absolute: 0.7 10*3/uL (ref 0.1–1.0)
Monocytes Relative: 3 %
Neutro Abs: 19.6 10*3/uL — ABNORMAL HIGH (ref 1.7–7.7)
Neutrophils Relative %: 93 %
Platelets: 383 10*3/uL (ref 150–400)
RBC: 3.74 MIL/uL — ABNORMAL LOW (ref 3.87–5.11)
RDW: 26.5 % — ABNORMAL HIGH (ref 11.5–15.5)
WBC: 21.4 10*3/uL — ABNORMAL HIGH (ref 4.0–10.5)
nRBC: 0 % (ref 0.0–0.2)

## 2021-06-28 LAB — URINALYSIS, ROUTINE W REFLEX MICROSCOPIC
Bilirubin Urine: NEGATIVE
Glucose, UA: NEGATIVE mg/dL
Hgb urine dipstick: NEGATIVE
Ketones, ur: 20 mg/dL — AB
Nitrite: NEGATIVE
Protein, ur: NEGATIVE mg/dL
Specific Gravity, Urine: 1.005 (ref 1.005–1.030)
pH: 7 (ref 5.0–8.0)

## 2021-06-28 LAB — COMPREHENSIVE METABOLIC PANEL
ALT: 8 U/L (ref 0–44)
AST: 24 U/L (ref 15–41)
Albumin: 2.5 g/dL — ABNORMAL LOW (ref 3.5–5.0)
Alkaline Phosphatase: 165 U/L — ABNORMAL HIGH (ref 38–126)
Anion gap: 12 (ref 5–15)
BUN: 8 mg/dL (ref 8–23)
CO2: 23 mmol/L (ref 22–32)
Calcium: 8.4 mg/dL — ABNORMAL LOW (ref 8.9–10.3)
Chloride: 95 mmol/L — ABNORMAL LOW (ref 98–111)
Creatinine, Ser: 0.69 mg/dL (ref 0.44–1.00)
GFR, Estimated: >60 mL/min (ref >60)
Glucose, Bld: 109 mg/dL — ABNORMAL HIGH (ref 70–99)
Potassium: 3.8 mmol/L (ref 3.5–5.1)
Sodium: 130 mmol/L — ABNORMAL LOW (ref 135–145)
Total Bilirubin: 1.9 mg/dL — ABNORMAL HIGH (ref 0.3–1.2)
Total Protein: 6.1 g/dL — ABNORMAL LOW (ref 6.5–8.1)

## 2021-06-28 LAB — RESP PANEL BY RT-PCR (FLU A&B, COVID) ARPGX2
Influenza A by PCR: NEGATIVE
Influenza B by PCR: NEGATIVE
SARS Coronavirus 2 by RT PCR: NEGATIVE

## 2021-06-28 LAB — LIPASE, BLOOD: Lipase: 27 U/L (ref 11–51)

## 2021-06-28 LAB — IRON AND TIBC
Iron: 47 ug/dL (ref 28–170)
Saturation Ratios: 36 % — ABNORMAL HIGH (ref 10.4–31.8)
TIBC: 132 ug/dL — ABNORMAL LOW (ref 250–450)
UIBC: 85 ug/dL

## 2021-06-28 LAB — I-STAT CHEM 8, ED
BUN: 8 mg/dL (ref 8–23)
Calcium, Ion: 1.11 mmol/L — ABNORMAL LOW (ref 1.15–1.40)
Chloride: 94 mmol/L — ABNORMAL LOW (ref 98–111)
Creatinine, Ser: 0.5 mg/dL (ref 0.44–1.00)
Glucose, Bld: 107 mg/dL — ABNORMAL HIGH (ref 70–99)
HCT: 32 % — ABNORMAL LOW (ref 36.0–46.0)
Hemoglobin: 10.9 g/dL — ABNORMAL LOW (ref 12.0–15.0)
Potassium: 3.4 mmol/L — ABNORMAL LOW (ref 3.5–5.1)
Sodium: 132 mmol/L — ABNORMAL LOW (ref 135–145)
TCO2: 28 mmol/L (ref 22–32)

## 2021-06-28 LAB — LACTIC ACID, PLASMA: Lactic Acid, Venous: 1.2 mmol/L (ref 0.5–1.9)

## 2021-06-28 MED ORDER — SODIUM CHLORIDE 0.9 % IV BOLUS
1000.0000 mL | Freq: Once | INTRAVENOUS | Status: AC
  Administered 2021-06-28: 1000 mL via INTRAVENOUS

## 2021-06-28 MED ORDER — ONDANSETRON HCL 4 MG/2ML IJ SOLN
4.0000 mg | Freq: Four times a day (QID) | INTRAMUSCULAR | Status: DC | PRN
  Administered 2021-06-28: 4 mg via INTRAVENOUS
  Filled 2021-06-28: qty 2

## 2021-06-28 MED ORDER — IOHEXOL 300 MG/ML  SOLN
100.0000 mL | Freq: Once | INTRAMUSCULAR | Status: AC | PRN
  Administered 2021-06-28: 100 mL via INTRAVENOUS

## 2021-06-28 MED ORDER — PIPERACILLIN-TAZOBACTAM 3.375 G IVPB
3.3750 g | Freq: Three times a day (TID) | INTRAVENOUS | Status: DC
  Administered 2021-06-28 – 2021-07-04 (×16): 3.375 g via INTRAVENOUS
  Filled 2021-06-28 (×17): qty 50

## 2021-06-28 MED ORDER — SODIUM CHLORIDE 0.9 % IV SOLN: INTRAVENOUS | Status: DC

## 2021-06-28 MED ORDER — FENTANYL CITRATE PF 50 MCG/ML IJ SOSY
25.0000 ug | PREFILLED_SYRINGE | INTRAMUSCULAR | Status: DC | PRN
  Administered 2021-06-28: 25 ug via INTRAVENOUS
  Filled 2021-06-28: qty 1

## 2021-06-28 MED ORDER — ACETAMINOPHEN 500 MG PO TABS
1000.0000 mg | ORAL_TABLET | Freq: Once | ORAL | Status: DC
  Filled 2021-06-28: qty 2

## 2021-06-28 MED ORDER — ONDANSETRON HCL 4 MG/2ML IJ SOLN
4.0000 mg | Freq: Once | INTRAMUSCULAR | Status: AC
  Administered 2021-06-28: 4 mg via INTRAVENOUS
  Filled 2021-06-28: qty 2

## 2021-06-28 MED ORDER — PANTOPRAZOLE SODIUM 40 MG IV SOLR
40.0000 mg | INTRAVENOUS | Status: DC
  Administered 2021-06-28 – 2021-07-10 (×10): 40 mg via INTRAVENOUS
  Filled 2021-06-28 (×11): qty 10

## 2021-06-28 MED ORDER — DICYCLOMINE HCL 10 MG/ML IM SOLN
20.0000 mg | Freq: Once | INTRAMUSCULAR | Status: AC
Start: 2021-06-28 — End: 2021-06-28
  Administered 2021-06-28: 20 mg via INTRAMUSCULAR
  Filled 2021-06-28: qty 2

## 2021-06-28 MED ORDER — DICYCLOMINE HCL 10 MG PO CAPS
10.0000 mg | ORAL_CAPSULE | Freq: Once | ORAL | Status: DC
  Filled 2021-06-28: qty 1

## 2021-06-28 MED ORDER — MORPHINE SULFATE (PF) 4 MG/ML IV SOLN
4.0000 mg | Freq: Once | INTRAVENOUS | Status: DC
Start: 2021-06-28 — End: 2021-06-28

## 2021-06-28 NOTE — ED Triage Notes (Signed)
Pt reports abdominal pain and nausea over the past few days. Pts son reports she recently stopped taking Vicodin and prescription and thinks he symptoms could be related to this.

## 2021-06-28 NOTE — H&P (Signed)
History and Physical    Robin Estrada OZD:[id number] DOB: 24-Dec-1945 DOA: 06/28/2021  DOS: the patient was seen and examined on 06/28/2021  PCP: Plotnikov, Evie Lacks, MD   Patient coming from: Home  I have personally briefly reviewed patient's old medical records in [place]  CC: abd pain, Nausea HPI: 76 year old [name] with a history of lung cancer, history of breast cancer, new vertebral masses presents the ER today with a several day history of worsening abdominal pain, nausea.  Patient was taking herself off of opiates because she did not like the way they were making her feel.  Son seems to think that her abdominal pain was related to possible opiate withdrawal.  Patient came to the ER for evaluation.  Labs showed a white count of 21.4, hemoglobin of 9.3, platelets of 383.  Chemistry sodium 130, BUN of 8, creatinine 0.79  UA is essentially negative.  CT abdomen with IV contrast showed large intussusception of the cecum and distal ileum within the length of the ascending colon and subsequent dilatation of the ascending colon. There are also noted various and numerous lytic lesions throughout the thoracolumbar spine and pelvis consistent with metastatic disease.  General surgery was consulted.  Dr. Leighton Ruff saw the patient.  Family is considering surgery.  Dr. Marcello Moores recommended medical admission and IV antibiotics.   ED Course: CT abd shows intussusception, white count 21,000  Review of Systems:  Review of Systems  Constitutional: Negative.   HENT: Negative.    Eyes: Negative.   Respiratory: Negative.    Cardiovascular: Negative.   Gastrointestinal:  Positive for abdominal pain and nausea.  Genitourinary: Negative.   Musculoskeletal: Negative.   Skin: Negative.   Neurological: Negative.   Endo/Heme/Allergies: Negative.   Psychiatric/Behavioral: Negative.    All other systems reviewed and are negative.  Past Medical History:  Diagnosis Date    Breast mass, left    Large   Cancer ([facility])    FIBROMYOMA  AGE 82-   BENIGN  RESOLVED   GI bleed 05/03/2021   1/23 continue with pantoprazole   Headache    Heart murmur    Hypertension    Mitral valve prolapse    Tuberculosis    AS CHILD  W/ TX    Past Surgical History:  Procedure Laterality Date   BIOPSY  05/04/2021   Procedure: BIOPSY;  Surgeon: Irving Copas., MD;  Location: [place];  Service: Gastroenterology;;   BRONCHIAL BIOPSY  05/07/2021   Procedure: BRONCHIAL BIOPSIES;  Surgeon: Margaretha Seeds, MD;  Location: [facility] ENDOSCOPY;  Service: Pulmonary;;   BRONCHIAL BRUSHINGS  05/07/2021   Procedure: BRONCHIAL BRUSHINGS;  Surgeon: Margaretha Seeds, MD;  Location: [facility] ENDOSCOPY;  Service: Pulmonary;;   BRONCHIAL NEEDLE ASPIRATION BIOPSY  05/07/2021   Procedure: BRONCHIAL NEEDLE ASPIRATION BIOPSIES;  Surgeon: Margaretha Seeds, MD;  Location: [facility]OSCOPY;  Service: Pulmonary;;   BRONCHIAL WASHINGS  05/07/2021   Procedure: BRONCHIAL WASHINGS;  Surgeon: Margaretha Seeds, MD;  Location: [place];  Service: Pulmonary;;   COLONOSCOPY WITH PROPOFOL N/A 05/09/2021   Procedure: COLONOSCOPY WITH PROPOFOL;  Surgeon: Ladene Artist, MD;  Location: [facility] ENDOSCOPY;  Service: Endoscopy;  Laterality: N/A;   ESOPHAGOGASTRODUODENOSCOPY N/A 05/04/2021   Procedure: ESOPHAGOGASTRODUODENOSCOPY (EGD);  Surgeon: Irving Copas., MD;  Location: [place];  Service: Gastroenterology;  Laterality: N/A;   HEMOSTASIS CONTROL  05/07/2021   Procedure: HEMOSTASIS CONTROL;  Surgeon: Margaretha Seeds, MD;  Location: [place];  Service: Pulmonary;;  MASS EXCISION Left 08/14/2017   Procedure: PARTIAL EXCISION  LEFT BREAST  MASS ERAS PATHWAY;  Surgeon: Fanny Skates, MD;  Location: [place];  Service: General;  Laterality: Left;   MASTECTOMY W/ SENTINEL NODE BIOPSY Left 09/14/2017   Procedure: LEFT TOTAL MASTECTOMY WITH SENTINEL LYMPH NODE BIOPSY;  Surgeon: Fanny Skates, MD;   Location: [place];  Service: General;  Laterality: Left;   NO PAST SURGERIES     POLYPECTOMY  05/09/2021   Procedure: POLYPECTOMY;  Surgeon: Ladene Artist, MD;  Location: [facility] ENDOSCOPY;  Service: Endoscopy;;   SUBMUCOSAL TATTOO INJECTION  05/09/2021   Procedure: SUBMUCOSAL TATTOO INJECTION;  Surgeon: Ladene Artist, MD;  Location: [place];  Service: Endoscopy;;   VIDEO BRONCHOSCOPY WITH ENDOBRONCHIAL ULTRASOUND N/A 05/07/2021   Procedure: VIDEO BRONCHOSCOPY WITH ENDOBRONCHIAL ULTRASOUND;  Surgeon: Margaretha Seeds, MD;  Location: [place];  Service: Pulmonary;  Laterality: N/A;     reports that she has never smoked. She has never used smokeless tobacco. She reports current alcohol use. She reports that she does not use drugs.  Allergies  Allergen Reactions   Statins Nausea And Vomiting and Other (See Comments)    MUSCLE PAIN   Amlodipine     fatigue   Clonidine Derivatives     headache   Losartan     abd pain   Sulfa Antibiotics Nausea Only   Sulfamethoxazole-Trimethoprim Nausea Only    " Severe Nausea "    Family History  Problem Relation Age of Onset   Stroke Mother    Stroke Maternal Grandmother    Breast cancer Sister     Prior to Admission medications   Medication Sig Start Date End Date Taking? Authorizing Provider  acetaminophen (TYLENOL) 500 MG tablet Take 1 tablet (500 mg total) by mouth every 8 (eight) hours as needed for headache. 05/26/21   Thurnell Lose, MD  atenolol (TENORMIN) 100 MG tablet Take 1 tablet (100 mg total) by mouth 2 (two) times daily. 06/05/21   Plotnikov, Evie Lacks, MD  cholecalciferol (VITAMIN D) 1000 units tablet Take 1 tablet (1,000 Units total) by mouth daily. 08/01/16   Plotnikov, Evie Lacks, MD  fentaNYL (DURAGESIC) 12 MCG/HR Place 1 patch onto the skin every 3 (three) days. 06/10/21   Plotnikov, Evie Lacks, MD  Multiple Vitamin (MULTIVITAMIN WITH MINERALS) TABS tablet Take 1 tablet by mouth daily. 05/27/21   Thurnell Lose, MD   ondansetron (ZOFRAN) 4 MG tablet Take 1 tablet (4 mg total) by mouth every 8 (eight) hours as needed for nausea. 06/10/21   Plotnikov, Evie Lacks, MD  pantoprazole (PROTONIX) 40 MG tablet Take 1 tablet (40 mg total) by mouth 2 (two) times daily before a meal. 05/26/21   Thurnell Lose, MD  senna-docusate (SENOKOT-S) 8.6-50 MG tablet Take 1-2 tablets by mouth daily as needed for mild constipation. 06/10/21 06/10/22  Plotnikov, Evie Lacks, MD  vitamin B-12 1000 MCG tablet Take 1 tablet (1,000 mcg total) by mouth daily. 05/27/21   Thurnell Lose, MD    Physical Exam: Vitals:   06/28/21 1640 06/28/21 1815 06/28/21 1945  BP: 127/77 (!) 155/62 (!) 130/53  Pulse: 99 89 84  Resp: 16 (!) 22 (!) 26  Temp: 97.9 F (36.6 C)    TempSrc: Oral    SpO2: 100% 98% 99%    Physical Exam Vitals and nursing note reviewed.  Constitutional:      General: She is not in acute distress.    Appearance: Normal appearance. She  is not ill-appearing, toxic-appearing or diaphoretic.  HENT:     Head: Normocephalic and atraumatic.     Nose: Nose normal. No rhinorrhea.  Eyes:     General:        Right eye: No discharge.        Left eye: No discharge.  Cardiovascular:     Rate and Rhythm: Normal rate and regular rhythm.     Pulses: Normal pulses.  Pulmonary:     Effort: Pulmonary effort is normal. No respiratory distress.     Breath sounds: No wheezing or rales.  Abdominal:     General: Abdomen is flat. Bowel sounds are decreased.     Palpations: Abdomen is soft.     Tenderness: There is abdominal tenderness in the right lower quadrant. There is no guarding or rebound.  Musculoskeletal:     Right lower leg: No edema.     Left lower leg: No edema.  Skin:    General: Skin is warm and dry.     Capillary Refill: Capillary refill takes less than 2 seconds.  Neurological:     General: No focal deficit present.     Mental Status: She is alert and oriented to person, place, and time.     Labs on Admission: I  have personally reviewed following labs and imaging studies  CBC: Recent Labs  Lab 06/28/21 1644 06/28/21 1758  WBC 21.4*  --   NEUTROABS 19.6*  --   HGB 9.3* 10.9*  HCT 30.8* 32.0*  MCV 82.4  --   PLT 383  --    Basic Metabolic Panel: Recent Labs  Lab 06/28/21 1644 06/28/21 1758  NA 130* 132*  K 3.8 3.4*  CL 95* 94*  CO2 23  --   GLUCOSE 109* 107*  BUN 8 8  CREATININE 0.69 0.50  CALCIUM 8.4*  --    GFR: Estimated Creatinine Clearance: 43 mL/min (by C-G formula based on SCr of 0.5 mg/dL). Liver Function Tests: Recent Labs  Lab 06/28/21 1644  AST 24  ALT 8  ALKPHOS 165*  BILITOT 1.9*  PROT 6.1*  ALBUMIN 2.5*   Recent Labs  Lab 06/28/21 1644  LIPASE 27   No results for input(s): AMMONIA in the last 168 hours. Coagulation Profile: No results for input(s): INR, PROTIME in the last 168 hours. Cardiac Enzymes: No results for input(s): CKTOTAL, CKMB, CKMBINDEX, TROPONINI in the last 168 hours. BNP (last 3 results) No results for input(s): PROBNP in the last 8760 hours. HbA1C: No results for input(s): HGBA1C in the last 72 hours. CBG: No results for input(s): GLUCAP in the last 168 hours. Lipid Profile: No results for input(s): CHOL, HDL, LDLCALC, TRIG, CHOLHDL, LDLDIRECT in the last 72 hours. Thyroid Function Tests: No results for input(s): TSH, T4TOTAL, FREET4, T3FREE, THYROIDAB in the last 72 hours. Anemia Panel: Recent Labs    06/28/21 1658  TIBC 132*  IRON 47   Urine analysis:    Component Value Date/Time   COLORURINE YELLOW 06/28/2021 1644   APPEARANCEUR HAZY (A) 06/28/2021 1644   LABSPEC 1.005 06/28/2021 1644   PHURINE 7.0 06/28/2021 1644   GLUCOSEU NEGATIVE 06/28/2021 1644   GLUCOSEU NEGATIVE 04/01/2021 0830   HGBUR NEGATIVE 06/28/2021 1644   BILIRUBINUR NEGATIVE 06/28/2021 1644   KETONESUR 20 (A) 06/28/2021 1644   PROTEINUR NEGATIVE 06/28/2021 1644   UROBILINOGEN 0.2 04/01/2021 0830   NITRITE NEGATIVE 06/28/2021 1644   LEUKOCYTESUR  SMALL (A) 06/28/2021 1644    Radiological Exams on Admission: I  have personally reviewed images CT ABDOMEN PELVIS W CONTRAST  Result Date: 06/28/2021 CLINICAL DATA:  Abdominal pain and nausea x5 days. EXAM: CT ABDOMEN AND PELVIS WITH CONTRAST TECHNIQUE: Multidetector CT imaging of the abdomen and pelvis was performed using the standard protocol following bolus administration of intravenous contrast. RADIATION DOSE REDUCTION: This exam was performed according to the departmental dose-optimization program which includes automated exposure control, adjustment of the mA and/or kV according to patient size and/or use of iterative reconstruction technique. CONTRAST:  120mL OMNIPAQUE IOHEXOL 300 MG/ML  SOLN COMPARISON:  May 03, 2021 FINDINGS: Lower chest: No acute abnormality. Hepatobiliary: No focal liver abnormality is seen. No gallstones, gallbladder wall thickening, or biliary dilatation. Pancreas: A stable 2.5 cm x 1.8 cm cystic appearing structure is seen within the tail of the pancreas (axial CT image 27, CT series 2). Spleen: Normal in size without focal abnormality. Adrenals/Urinary Tract: Adrenal glands are unremarkable. Kidneys are normal, without renal calculi or hydronephrosis. Multiple subcentimeter simple cysts are seen within both kidneys. The largest measures approximately 12 mm in diameter. Bladder is unremarkable. Stomach/Bowel: Stomach is within normal limits. The appendix is poorly visualized. There is intussusception of the cecum and distal ileum within the length of the ascending colon (axial CT images 38 through 54, CT series number 2). Subsequent dilatation of the ascending colon is seen (approximately 5.1 cm). The visualized loops of small bowel are normal in caliber. Vascular/Lymphatic: Aortic atherosclerosis. No enlarged abdominal or pelvic lymph nodes. Reproductive: Small heterogeneous uterine fibroids are seen within the body of the uterus on the right. The bilateral adnexa are  unremarkable. Other: No abdominal wall hernia or abnormality. No abdominopelvic ascites. Musculoskeletal: A compression fracture deformity is seen at the level of T11 with marked severity interval decrease in vertebral body height since the prior study. An expansile lytic lesion and associated pathological fracture is seen involving the anterolateral aspect of the fifth right rib (axial CT image 1, CT series 2). Numerous lytic lesions of various sizes are seen scattered throughout the thoracolumbar spine and pelvis. IMPRESSION: 1. Intussusception involving the cecum and distal ileum within the length of the ascending colon, with subsequent bowel obstruction. 2. Numerous lytic lesions of various sizes scattered throughout the thoracolumbar spine and pelvis, consistent with osseous metastatic disease. 3. Pathologic fracture deformity at the level of T11 with marked severity interval decrease in vertebral body height since the prior study. 4. Stable cystic lesion within the tail of the pancreas. 5. Small heterogeneous uterine fibroids. 6. Aortic atherosclerosis. Aortic Atherosclerosis (ICD10-I70.0). Electronically Signed   By: Virgina Norfolk M.D.   On: 06/28/2021 19:41    EKG: I have personally reviewed EKG: NSR   Assessment/Plan Principal Problem:   Intussusception of cecum ([facility])    Assessment and Plan: * Intussusception of cecum ([place])- (present on admission) Admit to med/surg. Pt and son are thinking they want to proceed with surgery. Son wanted to talk to Dr. Marcello Moores again. Text paged Dr. Marcello Moores about son's request. Dr. Marcello Moores replied back she would discuss with family in the AM. Start IV zosyn. Keep NPO.  NG tube would not be beneficial given location of intussusception(cecum/ascending colon). SCDs for DVT prophylaxis.    DVT prophylaxis: SCDs Code Status: Full Code Family Communication: discussed with pt and son at bedside. Discussed with Dr. Marcello Moores with surgery.  Disposition Plan: return home   Consults called: general surgery  Admission status: Inpatient, Med-Surg   Kristopher Oppenheim, DO [facility] 06/28/2021, 9:09 PM

## 2021-06-28 NOTE — Consult Note (Signed)
CC: abd pain  Requesting provider: Deno Etienne  HPI: Robin Estrada is an 76 y.o. female who is here for abd pain and LOA.  She is having Bm's and tolerating small amounts of food.  Past Medical History:  Diagnosis Date   Breast mass, left    Large   Cancer ([place])    FIBROMYOMA  AGE 84-   BENIGN  RESOLVED   Headache    Heart murmur    Hypertension    Mitral valve prolapse    Tuberculosis    AS CHILD  W/ [place]    Past Surgical History:  Procedure Laterality Date   BIOPSY  05/04/2021   Procedure: BIOPSY;  Surgeon: Irving Copas., MD;  Location: [facility] ENDOSCOPY;  Service: Gastroenterology;;   BRONCHIAL BIOPSY  05/07/2021   Procedure: BRONCHIAL BIOPSIES;  Surgeon: Margaretha Seeds, MD;  Location: [facility] ENDOSCOPY;  Service: Pulmonary;;   BRONCHIAL BRUSHINGS  05/07/2021   Procedure: BRONCHIAL BRUSHINGS;  Surgeon: Margaretha Seeds, MD;  Location: [facility] ENDOSCOPY;  Service: Pulmonary;;   BRONCHIAL NEEDLE ASPIRATION BIOPSY  05/07/2021   Procedure: BRONCHIAL NEEDLE ASPIRATION BIOPSIES;  Surgeon: Margaretha Seeds, MD;  Location: [facility] ENDOSCOPY;  Service: Pulmonary;;   BRONCHIAL WASHINGS  05/07/2021   Procedure: BRONCHIAL WASHINGS;  Surgeon: Margaretha Seeds, MD;  Location: [place];  Service: Pulmonary;;   COLONOSCOPY WITH PROPOFOL N/A 05/09/2021   Procedure: COLONOSCOPY WITH PROPOFOL;  Surgeon: Ladene Artist, MD;  Location: [facility] ENDOSCOPY;  Service: Endoscopy;  Laterality: N/A;   ESOPHAGOGASTRODUODENOSCOPY N/A 05/04/2021   Procedure: ESOPHAGOGASTRODUODENOSCOPY (EGD);  Surgeon: Irving Copas., MD;  Location: [place];  Service: Gastroenterology;  Laterality: N/A;   HEMOSTASIS CONTROL  05/07/2021   Procedure: HEMOSTASIS CONTROL;  Surgeon: Margaretha Seeds, MD;  Location: [facility] ENDOSCOPY;  Service: Pulmonary;;   MASS EXCISION Left 08/14/2017   Procedure: PARTIAL EXCISION  LEFT BREAST  MASS ERAS PATHWAY;  Surgeon: Fanny Skates, MD;  Location: [place];  Service: General;   Laterality: Left;   MASTECTOMY W/ SENTINEL NODE BIOPSY Left 09/14/2017   Procedure: LEFT TOTAL MASTECTOMY WITH SENTINEL LYMPH NODE BIOPSY;  Surgeon: Fanny Skates, MD;  Location: [place];  Service: General;  Laterality: Left;   NO PAST SURGERIES     POLYPECTOMY  05/09/2021   Procedure: POLYPECTOMY;  Surgeon: Ladene Artist, MD;  Location: [facility] ENDOSCOPY;  Service: Endoscopy;;   SUBMUCOSAL TATTOO INJECTION  05/09/2021   Procedure: SUBMUCOSAL TATTOO INJECTION;  Surgeon: Ladene Artist, MD;  Location: [place];  Service: Endoscopy;;   VIDEO BRONCHOSCOPY WITH ENDOBRONCHIAL ULTRASOUND N/A 05/07/2021   Procedure: VIDEO BRONCHOSCOPY WITH ENDOBRONCHIAL ULTRASOUND;  Surgeon: Margaretha Seeds, MD;  Location: [place];  Service: Pulmonary;  Laterality: N/A;    Family History  Problem Relation Age of Onset   Stroke Mother    Stroke Maternal Grandmother    Breast cancer Sister     Social:  reports that she has never smoked. She has never used smokeless tobacco. She reports current alcohol use. She reports that she does not use drugs.  Allergies:  Allergies  Allergen Reactions   Statins Nausea And Vomiting and Other (See Comments)    MUSCLE PAIN   Amlodipine     fatigue   Clonidine Derivatives     headache   Losartan     abd pain   Sulfa Antibiotics Nausea Only   Sulfamethoxazole-Trimethoprim Nausea Only    " Severe Nausea "    Medications: I have reviewed the  patient's current medications.  Results for orders placed or performed during the hospital encounter of 06/28/21 (from the past 48 hour(s))  CBC with Differential     Status: Abnormal   Collection Time: 06/28/21  4:44 PM  Result Value Ref Range   WBC 21.4 (H) 4.0 - 10.5 K/uL   RBC 3.74 (L) 3.87 - 5.11 MIL/uL   Hemoglobin 9.3 (L) 12.0 - 15.0 g/dL   HCT 30.8 (L) 36.0 - 46.0 %   MCV 82.4 80.0 - 100.0 fL   MCH 24.9 (L) 26.0 - 34.0 pg   MCHC 30.2 30.0 - 36.0 g/dL   RDW 26.5 (H) 11.5 - 15.5 %   Platelets 383 150 - 400  K/uL   nRBC 0.0 0.0 - 0.2 %   Neutrophils Relative % 93 %   Neutro Abs 19.6 (H) 1.7 - 7.7 K/uL   Lymphocytes Relative 3 %   Lymphs Abs 0.7 0.7 - 4.0 K/uL   Monocytes Relative 3 %   Monocytes Absolute 0.7 0.1 - 1.0 K/uL   Eosinophils Relative 0 %   Eosinophils Absolute 0.0 0.0 - 0.5 K/uL   Basophils Relative 0 %   Basophils Absolute 0.1 0.0 - 0.1 K/uL   Immature Granulocytes 1 %   Abs Immature Granulocytes 0.30 (H) 0.00 - 0.07 K/uL   Acanthocytes PRESENT    Polychromasia PRESENT    Ovalocytes PRESENT     Comment: Performed at [facility], [place] [address] [place] [address] [place], [place] [address]  Comprehensive metabolic panel     Status: Abnormal   Collection Time: 06/28/21  4:44 PM  Result Value Ref Range   Sodium 130 (L) 135 - 145 mmol/L   Potassium 3.8 3.5 - 5.1 mmol/L   Chloride 95 (L) 98 - 111 mmol/L   CO2 23 22 - 32 mmol/L   Glucose, Bld 109 (H) 70 - 99 mg/dL    Comment: Glucose reference range applies only to samples taken after fasting for at least 8 hours.   BUN 8 8 - 23 mg/dL   Creatinine, Ser 0.69 0.44 - 1.00 mg/dL   Calcium 8.4 (L) 8.9 - 10.3 mg/dL   Total Protein 6.1 (L) 6.5 - 8.1 g/dL   Albumin 2.5 (L) 3.5 - 5.0 g/dL   AST 24 15 - 41 U/L   ALT 8 0 - 44 U/L   Alkaline Phosphatase 165 (H) 38 - 126 U/L   Total Bilirubin 1.9 (H) 0.3 - 1.2 mg/dL   GFR, Estimated >60 >60 mL/min    Comment: (NOTE) Calculated using the CKD-EPI Creatinine Equation (2021)    Anion gap 12 5 - 15    Comment: Performed at [facility], [place] [address], [place], [place] [address]  Lipase, blood     Status: None   Collection Time: 06/28/21  4:44 PM  Result Value Ref Range   Lipase 27 11 - 51 U/L    Comment: Performed at [facility], [place] [address] [place], [place] [address]  Urinalysis, Routine w reflex microscopic Urine, Clean Catch     Status: Abnormal   Collection Time: 06/28/21  4:44 PM  Result Value Ref Range   Color, Urine YELLOW YELLOW    APPearance HAZY (A) CLEAR   Specific Gravity, Urine 1.005 1.005 - 1.030   pH 7.0 5.0 - 8.0   Glucose, UA NEGATIVE NEGATIVE mg/dL   Hgb urine dipstick NEGATIVE NEGATIVE   Bilirubin Urine NEGATIVE NEGATIVE   Ketones, ur 20 (A) NEGATIVE  mg/dL   Protein, ur NEGATIVE NEGATIVE mg/dL   Nitrite NEGATIVE NEGATIVE   Leukocytes,Ua SMALL (A) NEGATIVE   RBC / HPF 0-5 0 - 5 RBC/hpf   WBC, UA 11-20 0 - 5 WBC/hpf   Bacteria, UA RARE (A) NONE SEEN   Squamous Epithelial / LPF 6-10 0 - 5   Mucus PRESENT     Comment: Performed at [facility], [place] [address] [place], [place] [address]  Iron and TIBC     Status: Abnormal   Collection Time: 06/28/21  4:58 PM  Result Value Ref Range   Iron 47 28 - 170 ug/dL   TIBC 132 (L) 250 - 450 ug/dL   Saturation Ratios 36 (H) 10.4 - 31.8 %   UIBC 85 ug/dL    Comment: Performed at [facility], [place] [address], [place], [place] [address]  I-stat chem 8, ED (not at [facility] or [facility])     Status: Abnormal   Collection Time: 06/28/21  5:58 PM  Result Value Ref Range   Sodium 132 (L) 135 - 145 mmol/L   Potassium 3.4 (L) 3.5 - 5.1 mmol/L   Chloride 94 (L) 98 - 111 mmol/L   BUN 8 8 - 23 mg/dL   Creatinine, Ser 0.50 0.44 - 1.00 mg/dL   Glucose, Bld 107 (H) 70 - 99 mg/dL    Comment: Glucose reference range applies only to samples taken after fasting for at least 8 hours.   Calcium, Ion 1.11 (L) 1.15 - 1.40 mmol/L   TCO2 28 22 - 32 mmol/L   Hemoglobin 10.9 (L) 12.0 - 15.0 g/dL   HCT 32.0 (L) 36.0 - 46.0 %    CT ABDOMEN PELVIS W CONTRAST  Result Date: 06/28/2021 CLINICAL DATA:  Abdominal pain and nausea x5 days. EXAM: CT ABDOMEN AND PELVIS WITH CONTRAST TECHNIQUE: Multidetector CT imaging of the abdomen and pelvis was performed using the standard protocol following bolus administration of intravenous contrast. RADIATION DOSE REDUCTION: This exam was performed according to the departmental dose-optimization program which includes  automated exposure control, adjustment of the mA and/or kV according to patient size and/or use of iterative reconstruction technique. CONTRAST:  118mL OMNIPAQUE IOHEXOL 300 MG/ML  SOLN COMPARISON:  May 03, 2021 FINDINGS: Lower chest: No acute abnormality. Hepatobiliary: No focal liver abnormality is seen. No gallstones, gallbladder wall thickening, or biliary dilatation. Pancreas: A stable 2.5 cm x 1.8 cm cystic appearing structure is seen within the tail of the pancreas (axial CT image 27, CT series 2). Spleen: Normal in size without focal abnormality. Adrenals/Urinary Tract: Adrenal glands are unremarkable. Kidneys are normal, without renal calculi or hydronephrosis. Multiple subcentimeter simple cysts are seen within both kidneys. The largest measures approximately 12 mm in diameter. Bladder is unremarkable. Stomach/Bowel: Stomach is within normal limits. The appendix is poorly visualized. There is intussusception of the cecum and distal ileum within the length of the ascending colon (axial CT images 38 through 54, CT series number 2). Subsequent dilatation of the ascending colon is seen (approximately 5.1 cm). The visualized loops of small bowel are normal in caliber. Vascular/Lymphatic: Aortic atherosclerosis. No enlarged abdominal or pelvic lymph nodes. Reproductive: Small heterogeneous uterine fibroids are seen within the body of the uterus on the right. The bilateral adnexa are unremarkable. Other: No abdominal wall hernia or abnormality. No abdominopelvic ascites. Musculoskeletal: A compression fracture deformity is seen at the level of T11 with marked severity interval decrease in vertebral body height since the prior study. An expansile  lytic lesion and associated pathological fracture is seen involving the anterolateral aspect of the fifth right rib (axial CT image 1, CT series 2). Numerous lytic lesions of various sizes are seen scattered throughout the thoracolumbar spine and pelvis. IMPRESSION:  1. Intussusception involving the cecum and distal ileum within the length of the ascending colon, with subsequent bowel obstruction. 2. Numerous lytic lesions of various sizes scattered throughout the thoracolumbar spine and pelvis, consistent with osseous metastatic disease. 3. Pathologic fracture deformity at the level of T11 with marked severity interval decrease in vertebral body height since the prior study. 4. Stable cystic lesion within the tail of the pancreas. 5. Small heterogeneous uterine fibroids. 6. Aortic atherosclerosis. Aortic Atherosclerosis (ICD10-I70.0). Electronically Signed   By: Virgina Norfolk M.D.   On: 06/28/2021 19:41    ROS - all of the below systems have been reviewed with the patient and positives are indicated with bold text General: chills, fever or night sweats Eyes: blurry vision or double vision ENT: epistaxis or sore throat Hematologic/Lymphatic: bleeding problems, blood clots or swollen lymph nodes Endocrine: temperature intolerance or unexpected weight changes Breast: new or changing breast lumps or nipple discharge Resp: cough, shortness of breath, or wheezing CV: chest pain or dyspnea on exertion GI: as per HPI GU: dysuria, trouble voiding, or hematuria Neuro: TIA or stroke symptoms    PE Blood pressure (!) 130/53, pulse 84, temperature 97.9 F (36.6 C), temperature source Oral, resp. rate (!) 26, SpO2 99 %. Constitutional: NAD; conversant; no deformities Eyes: Moist conjunctiva; no lid lag; anicteric; PERRL Neck: Trachea midline; no thyromegaly Lungs: Normal respiratory effort CV: RRR GI: Abd soft, nondistended, min TTP in RUQ MSK: Normal range of motion of extremities; no clubbing/cyanosis Psychiatric: Appropriate affect; alert and oriented x3  Results for orders placed or performed during the hospital encounter of 06/28/21 (from the past 48 hour(s))  CBC with Differential     Status: Abnormal   Collection Time: 06/28/21  4:44 PM  Result  Value Ref Range   WBC 21.4 (H) 4.0 - 10.5 K/uL   RBC 3.74 (L) 3.87 - 5.11 MIL/uL   Hemoglobin 9.3 (L) 12.0 - 15.0 g/dL   HCT 30.8 (L) 36.0 - 46.0 %   MCV 82.4 80.0 - 100.0 fL   MCH 24.9 (L) 26.0 - 34.0 pg   MCHC 30.2 30.0 - 36.0 g/dL   RDW 26.5 (H) 11.5 - 15.5 %   Platelets 383 150 - 400 K/uL   nRBC 0.0 0.0 - 0.2 %   Neutrophils Relative % 93 %   Neutro Abs 19.6 (H) 1.7 - 7.7 K/uL   Lymphocytes Relative 3 %   Lymphs Abs 0.7 0.7 - 4.0 K/uL   Monocytes Relative 3 %   Monocytes Absolute 0.7 0.1 - 1.0 K/uL   Eosinophils Relative 0 %   Eosinophils Absolute 0.0 0.0 - 0.5 K/uL   Basophils Relative 0 %   Basophils Absolute 0.1 0.0 - 0.1 K/uL   Immature Granulocytes 1 %   Abs Immature Granulocytes 0.30 (H) 0.00 - 0.07 K/uL   Acanthocytes PRESENT    Polychromasia PRESENT    Ovalocytes PRESENT     Comment: Performed at [facility], [place] [address] [place], [place] [address]  Comprehensive metabolic panel     Status: Abnormal   Collection Time: 06/28/21  4:44 PM  Result Value Ref Range   Sodium 130 (L) 135 - 145 mmol/L   Potassium 3.8 3.5 - 5.1 mmol/L   Chloride  95 (L) 98 - 111 mmol/L   CO2 23 22 - 32 mmol/L   Glucose, Bld 109 (H) 70 - 99 mg/dL    Comment: Glucose reference range applies only to samples taken after fasting for at least 8 hours.   BUN 8 8 - 23 mg/dL   Creatinine, Ser 0.69 0.44 - 1.00 mg/dL   Calcium 8.4 (L) 8.9 - 10.3 mg/dL   Total Protein 6.1 (L) 6.5 - 8.1 g/dL   Albumin 2.5 (L) 3.5 - 5.0 g/dL   AST 24 15 - 41 U/L   ALT 8 0 - 44 U/L   Alkaline Phosphatase 165 (H) 38 - 126 U/L   Total Bilirubin 1.9 (H) 0.3 - 1.2 mg/dL   GFR, Estimated >60 >60 mL/min    Comment: (NOTE) Calculated using the CKD-EPI Creatinine Equation (2021)    Anion gap 12 5 - 15    Comment: Performed at [facility], [place] [address] [place], [place] [address]  Lipase, blood     Status: None   Collection Time: 06/28/21  4:44 PM  Result Value Ref Range    Lipase 27 11 - 51 U/L    Comment: Performed at [facility], [place] [address], [place], [place] [address]  Urinalysis, Routine w reflex microscopic Urine, Clean Catch     Status: Abnormal   Collection Time: 06/28/21  4:44 PM  Result Value Ref Range   Color, Urine YELLOW YELLOW   APPearance HAZY (A) CLEAR   Specific Gravity, Urine 1.005 1.005 - 1.030   pH 7.0 5.0 - 8.0   Glucose, UA NEGATIVE NEGATIVE mg/dL   Hgb urine dipstick NEGATIVE NEGATIVE   Bilirubin Urine NEGATIVE NEGATIVE   Ketones, ur 20 (A) NEGATIVE mg/dL   Protein, ur NEGATIVE NEGATIVE mg/dL   Nitrite NEGATIVE NEGATIVE   Leukocytes,Ua SMALL (A) NEGATIVE   RBC / HPF 0-5 0 - 5 RBC/hpf   WBC, UA 11-20 0 - 5 WBC/hpf   Bacteria, UA RARE (A) NONE SEEN   Squamous Epithelial / LPF 6-10 0 - 5   Mucus PRESENT     Comment: Performed at [facility], [place] [address] [place], [place] [address]  Iron and TIBC     Status: Abnormal   Collection Time: 06/28/21  4:58 PM  Result Value Ref Range   Iron 47 28 - 170 ug/dL   TIBC 132 (L) 250 - 450 ug/dL   Saturation Ratios 36 (H) 10.4 - 31.8 %   UIBC 85 ug/dL    Comment: Performed at [facility], [place] [address] [place], [place] [address]  I-stat chem 8, ED (not at [facility] or [facility])     Status: Abnormal   Collection Time: 06/28/21  5:58 PM  Result Value Ref Range   Sodium 132 (L) 135 - 145 mmol/L   Potassium 3.4 (L) 3.5 - 5.1 mmol/L   Chloride 94 (L) 98 - 111 mmol/L   BUN 8 8 - 23 mg/dL   Creatinine, Ser 0.50 0.44 - 1.00 mg/dL   Glucose, Bld 107 (H) 70 - 99 mg/dL    Comment: Glucose reference range applies only to samples taken after fasting for at least 8 hours.   Calcium, Ion 1.11 (L) 1.15 - 1.40 mmol/L   TCO2 28 22 - 32 mmol/L   Hemoglobin 10.9 (L) 12.0 - 15.0 g/dL   HCT 32.0 (L) 36.0 - 46.0 %    CT ABDOMEN PELVIS W CONTRAST  Result Date: 06/28/2021  CLINICAL DATA:  Abdominal pain and nausea x5 days. EXAM: CT ABDOMEN AND PELVIS  WITH CONTRAST TECHNIQUE: Multidetector CT imaging of the abdomen and pelvis was performed using the standard protocol following bolus administration of intravenous contrast. RADIATION DOSE REDUCTION: This exam was performed according to the departmental dose-optimization program which includes automated exposure control, adjustment of the mA and/or kV according to patient size and/or use of iterative reconstruction technique. CONTRAST:  17mL OMNIPAQUE IOHEXOL 300 MG/ML  SOLN COMPARISON:  May 03, 2021 FINDINGS: Lower chest: No acute abnormality. Hepatobiliary: No focal liver abnormality is seen. No gallstones, gallbladder wall thickening, or biliary dilatation. Pancreas: A stable 2.5 cm x 1.8 cm cystic appearing structure is seen within the tail of the pancreas (axial CT image 27, CT series 2). Spleen: Normal in size without focal abnormality. Adrenals/Urinary Tract: Adrenal glands are unremarkable. Kidneys are normal, without renal calculi or hydronephrosis. Multiple subcentimeter simple cysts are seen within both kidneys. The largest measures approximately 12 mm in diameter. Bladder is unremarkable. Stomach/Bowel: Stomach is within normal limits. The appendix is poorly visualized. There is intussusception of the cecum and distal ileum within the length of the ascending colon (axial CT images 38 through 54, CT series number 2). Subsequent dilatation of the ascending colon is seen (approximately 5.1 cm). The visualized loops of small bowel are normal in caliber. Vascular/Lymphatic: Aortic atherosclerosis. No enlarged abdominal or pelvic lymph nodes. Reproductive: Small heterogeneous uterine fibroids are seen within the body of the uterus on the right. The bilateral adnexa are unremarkable. Other: No abdominal wall hernia or abnormality. No abdominopelvic ascites. Musculoskeletal: A compression fracture deformity is seen at the level of T11 with marked severity interval decrease in vertebral body height since  the prior study. An expansile lytic lesion and associated pathological fracture is seen involving the anterolateral aspect of the fifth right rib (axial CT image 1, CT series 2). Numerous lytic lesions of various sizes are seen scattered throughout the thoracolumbar spine and pelvis. IMPRESSION: 1. Intussusception involving the cecum and distal ileum within the length of the ascending colon, with subsequent bowel obstruction. 2. Numerous lytic lesions of various sizes scattered throughout the thoracolumbar spine and pelvis, consistent with osseous metastatic disease. 3. Pathologic fracture deformity at the level of T11 with marked severity interval decrease in vertebral body height since the prior study. 4. Stable cystic lesion within the tail of the pancreas. 5. Small heterogeneous uterine fibroids. 6. Aortic atherosclerosis. Aortic Atherosclerosis (ICD10-I70.0). Electronically Signed   By: Virgina Norfolk M.D.   On: 06/28/2021 19:41     A/P: Robin Estrada is an 76 y.o. female with metastatic breast cancer and CT concerning for introsusception.  Pt is not peritonitic and has no obstruction. Recommend Admit to medicine and IV antibiotics.  Will discuss surgery with the patient in more detail in AM    Rosario Adie, MD  Col[facility]  Total time of evaluation, examination, counseling and implementing medical decisions was 55 mins. moderate decision making.

## 2021-06-28 NOTE — Assessment & Plan Note (Signed)
Admit to med/surg. Pt and son are thinking they want to proceed with surgery. Son wanted to talk to Dr. Marcello Moores again. Text paged Dr. Marcello Moores about son's request. Dr. Marcello Moores replied back she would discuss with family in the AM. Start IV zosyn. Keep NPO.  NG tube would not be beneficial given location of intussusception(cecum/ascending colon). SCDs for DVT prophylaxis.

## 2021-06-28 NOTE — Progress Notes (Signed)
Pharmacy Antibiotic Note  Robin Estrada is a 76 y.o. female admitted on 06/28/2021 with abdominal pain, found to have intussusception.  Pharmacy has been consulted for zosyn dosing.  WBC 21.4, Tm 97.9, Scr 0.5  Plan: Zosyn 3.375g IV q8h (4 hour infusion).  Pharmacy will formally sign off consult but continue to monitor patient peripherally.     Temp (24hrs), Avg:97.9 F (36.6 C), Min:97.9 F (36.6 C), Max:97.9 F (36.6 C)  Recent Labs  Lab 06/28/21 1644 06/28/21 1758  WBC 21.4*  --   CREATININE 0.69 0.50    Estimated Creatinine Clearance: 43 mL/min (by C-G formula based on SCr of 0.5 mg/dL).    Allergies  Allergen Reactions   Statins Nausea And Vomiting and Other (See Comments)    MUSCLE PAIN   Amlodipine     fatigue   Clonidine Derivatives     headache   Losartan     abd pain   Sulfa Antibiotics Nausea Only   Sulfamethoxazole-Trimethoprim Nausea Only    " Severe Nausea "    Antimicrobials this admission: Zosyn 2/17 >>   Dose adjustments this admission:   Microbiology results: None thus far  Thank you for allowing pharmacy to be a part of this patients care.  Dimple Nanas, PharmD 06/28/2021 9:21 PM

## 2021-06-28 NOTE — Progress Notes (Signed)
Nidhi Jacome   DOB:Apr 21, 1946   UU#:[id number]   HKV#:[id number]  Oncology follow up   Subjective: I met Ms Vandermeer during her last hospital admission, she did not come in for her office visit with me on 2/13 as scheduled. She presented to the emergency room today with nausea, vomiting, abdominal pain, and fatigue.  Her son was at the bedside and translated for her.  Apparently patient was on Vicodin twice a day since her last hospital stay, due to the side effect of constipation, drowsiness, and fatigue, she decided to stop the Vicodin a few days ago, and subsequently developed nausea, vomiting, and abdominal cramps.  She states her back pain has improved since the radiation.    Objective:  Vitals:   06/28/21 1640 06/28/21 1815  BP: 127/77 (!) 155/62  Pulse: 99 89  Resp: 16 (!) 22  Temp: 97.9 F (36.6 C)   SpO2: 100% 98%    There is no height or weight on file to calculate BMI. No intake or output data in the 24 hours ending 06/28/21 1857   Sclerae unicteric  No leg edema   Awake and alert  CBG (last 3)  No results for input(s): GLUCAP in the last 72 hours.   Labs:  Urine Studies No results for input(s): UHGB, CRYS in the last 72 hours.  Invalid input(s): UACOL, UAPR, USPG, UPH, UTP, UGL, UKET, UBIL, UNIT, UROB, ULEU, UEPI, UWBC, URBC, UBAC, CAST, UCOM, BILUA  Basic Metabolic Panel: Recent Labs  Lab 06/28/21 1644 06/28/21 1758  NA 130* 132*  K 3.8 3.4*  CL 95* 94*  CO2 23  --   GLUCOSE 109* 107*  BUN 8 8  CREATININE 0.69 0.50  CALCIUM 8.4*  --    GFR Estimated Creatinine Clearance: 43 mL/min (by C-G formula based on SCr of 0.5 mg/dL). Liver Function Tests: Recent Labs  Lab 06/28/21 1644  AST 24  ALT 8  ALKPHOS 165*  BILITOT 1.9*  PROT 6.1*  ALBUMIN 2.5*   Recent Labs  Lab 06/28/21 1644  LIPASE 27   No results for input(s): AMMONIA in the last 168 hours. Coagulation profile No results for input(s): INR, PROTIME in the last 168 hours.  CBC: Recent  Labs  Lab 06/28/21 1644 06/28/21 1758  WBC 21.4*  --   NEUTROABS 19.6*  --   HGB 9.3* 10.9*  HCT 30.8* 32.0*  MCV 82.4  --   PLT 383  --    Cardiac Enzymes: No results for input(s): CKTOTAL, CKMB, CKMBINDEX, TROPONINI in the last 168 hours. BNP: Invalid input(s): POCBNP CBG: No results for input(s): GLUCAP in the last 168 hours. D-Dimer No results for input(s): DDIMER in the last 72 hours. Hgb A1c No results for input(s): HGBA1C in the last 72 hours. Lipid Profile No results for input(s): CHOL, HDL, LDLCALC, TRIG, CHOLHDL, LDLDIRECT in the last 72 hours. Thyroid function studies No results for input(s): TSH, T4TOTAL, T3FREE, THYROIDAB in the last 72 hours.  Invalid input(s): FREET3 Anemia work up Recent Labs    06/28/21 1658  TIBC 132*  IRON 47   Microbiology No results found for this or any previous visit (from the past 240 hour(s)).    Studies:  No results found.  Assessment: 76 y.o. 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 Anemia  secondary to iron deficiency and chronic disease Gastric ulcer Cirrhosis of the liver Emphysema Cystic mass of the pancreas Mild hyperbilirubinemia   Plan:  -since she missed her appointment with me early this week, I came to ED to see her, gave her a copy of her pathology report from the second opinion review at [facility].  Her biopsy from colon and lung were all consistent with metastatic disease from her previous breast cancer (malignant phyllodes tumor), triple negative.  -Recurrent nausea, abdominal cramps could certainly be related to narcotics versus drug, she seems to be comfortable when I see her. If she has recurrent symptoms, she may need to be admitted for observation for short  stay. -Given the abnormal liver function, and her symptoms, CT scan has been ordered and will be completed soon  -Patient benefited from palliative radiation, her back pain has improved.  She was previously seen by neurosurgery and felt not to be a candidate for surgical intervention for her T11 met, but patient is interested in Kyphoplasty if that will help her. If she will be admitted, will ask IR to see her  -I discussed systemic therapy options, which will be mainly chemotherapy.  As usual, patient had many questions, and is very reluctant to commit to any treatment.  I encouraged her to get a second opinion at [facility], she agreed. I will refer her.  -Patient is more interested in targeted therapy, [facility] on her biopsy, results still pending. -I will set up her appointment with our palliative care clinic in our office -Patient would benefit from home physical therapy -I will see her back in a few weeks when her FO result returns.     Truitt Merle, MD 06/28/2021  6:57 PM

## 2021-06-28 NOTE — Subjective & Objective (Signed)
CC: abd pain, Nausea HPI: 76 year old [name] with a history of lung cancer, history of breast cancer, new vertebral masses presents the ER today with a several day history of worsening abdominal pain, nausea.  Patient was taking herself off of opiates because she did not like the way they were making her feel.  Son seems to think that her abdominal pain was related to possible opiate withdrawal.  Patient came to the ER for evaluation.  Labs showed a white count of 21.4, hemoglobin of 9.3, platelets of 383.  Chemistry sodium 130, BUN of 8, creatinine 0.79  UA is essentially negative.  CT abdomen with IV contrast showed large intussusception of the cecum and distal ileum within the length of the ascending colon and subsequent dilatation of the ascending colon. There are also noted various and numerous lytic lesions throughout the thoracolumbar spine and pelvis consistent with metastatic disease.  General surgery was consulted.  Dr. Leighton Ruff saw the patient.  Family is considering surgery.  Dr. Marcello Moores recommended medical admission and IV antibiotics.

## 2021-06-28 NOTE — ED Provider Notes (Signed)
[facility] DEPT Provider Note   CSN: [id number] Arrival date & time: 06/28/21  1621     History  Chief Complaint  Patient presents with   Abdominal Pain    Akina Chiang is a 76 y.o. female.  76 yo F with a chief complaints of abdominal discomfort and diarrhea and feeling generally unwell.  This been going on for about a week now.  She felt like the pain medicine she was taking at home was not making her well and so she abruptly stopped it on Monday.  She felt well about 24 hours afterwards and since has felt pretty bad.  Her family thinks she may be withdrawing from narcotics.  They went and saw her PCP today who was concerned for possible intra-abdominal pathology especially since she was recently seen in the hospital and was found to have likely recurrent cancer with metastasis.  She is awaiting for biopsy for confirmation.   Abdominal Pain     Home Medications Prior to Admission medications   Medication Sig Start Date End Date Taking? Authorizing Provider  acetaminophen (TYLENOL) 500 MG tablet Take 1 tablet (500 mg total) by mouth every 8 (eight) hours as needed for headache. 05/26/21   Thurnell Lose, MD  atenolol (TENORMIN) 100 MG tablet Take 1 tablet (100 mg total) by mouth 2 (two) times daily. 06/05/21   Plotnikov, Evie Lacks, MD  cholecalciferol (VITAMIN D) 1000 units tablet Take 1 tablet (1,000 Units total) by mouth daily. 08/01/16   Plotnikov, Evie Lacks, MD  fentaNYL (DURAGESIC) 12 MCG/HR Place 1 patch onto the skin every 3 (three) days. 06/10/21   Plotnikov, Evie Lacks, MD  Multiple Vitamin (MULTIVITAMIN WITH MINERALS) TABS tablet Take 1 tablet by mouth daily. 05/27/21   Thurnell Lose, MD  ondansetron (ZOFRAN) 4 MG tablet Take 1 tablet (4 mg total) by mouth every 8 (eight) hours as needed for nausea. 06/10/21   Plotnikov, Evie Lacks, MD  pantoprazole (PROTONIX) 40 MG tablet Take 1 tablet (40 mg total) by mouth 2 (two) times daily before a  meal. 05/26/21   Thurnell Lose, MD  senna-docusate (SENOKOT-S) 8.6-50 MG tablet Take 1-2 tablets by mouth daily as needed for mild constipation. 06/10/21 06/10/22  Plotnikov, Evie Lacks, MD  vitamin B-12 1000 MCG tablet Take 1 tablet (1,000 mcg total) by mouth daily. 05/27/21   Thurnell Lose, MD      Allergies    Statins, Amlodipine, Clonidine derivatives, Losartan, Sulfa antibiotics, and Sulfamethoxazole-trimethoprim    Review of Systems   Review of Systems  Gastrointestinal:  Positive for abdominal pain.   Physical Exam Updated Vital Signs BP 127/77 (BP Location: Left Arm)    Pulse 99    Temp 97.9 F (36.6 C) (Oral)    Resp 16    SpO2 100%  Physical Exam Vitals and nursing note reviewed.  Constitutional:      General: She is not in acute distress.    Appearance: She is well-developed. She is not diaphoretic.  HENT:     Head: Normocephalic and atraumatic.  Eyes:     Pupils: Pupils are equal, round, and reactive to light.  Cardiovascular:     Rate and Rhythm: Normal rate and regular rhythm.     Heart sounds: No murmur heard.   No friction rub. No gallop.  Pulmonary:     Effort: Pulmonary effort is normal.     Breath sounds: No wheezing or rales.  Abdominal:     General:  There is no distension.     Palpations: Abdomen is soft.     Tenderness: There is abdominal tenderness (mild diffuse pain).  Musculoskeletal:        General: No tenderness.     Cervical back: Normal range of motion and neck supple.  Skin:    General: Skin is warm and dry.  Neurological:     Mental Status: She is alert and oriented to person, place, and time.  Psychiatric:        Behavior: Behavior normal.    ED Results / Procedures / Treatments   Labs (all labs ordered are listed, but only abnormal results are displayed) Labs Reviewed  CBC WITH DIFFERENTIAL/PLATELET  COMPREHENSIVE METABOLIC PANEL  LIPASE, BLOOD  URINALYSIS, ROUTINE W REFLEX MICROSCOPIC  IRON AND TIBC  I-STAT CHEM 8, ED     EKG None  Radiology No results found.  Procedures Procedures    Medications Ordered in ED Medications  sodium chloride 0.9 % bolus 1,000 mL (has no administration in time range)  ondansetron (ZOFRAN) injection 4 mg (has no administration in time range)  acetaminophen (TYLENOL) tablet 1,000 mg (has no administration in time range)  dicyclomine (BENTYL) capsule 10 mg (has no administration in time range)    ED Course/ Medical Decision Making/ A&P                           Medical Decision Making Amount and/or Complexity of Data Reviewed Labs: ordered. Radiology: ordered. ECG/medicine tests: ordered.  Risk OTC drugs. Prescription drug management. Decision regarding hospitalization.   Patient is a 76 y.o. female with a cc of abdominal pain.  This been going on for a few days now.  It started shortly after she stopped her chronic narcotics for her cancer.  She was thought to be cancer free and then came to the emergency department at the end of December and was found to have likely recurrence with metastasis.  She has had radiation on areas in her back and has had significant improvements of her pain and so stopped her medications.  Her family are concerned that maybe she is withdrawing from narcotics.  She had seen her family doctor today and I reviewed the notes.  He is concerned for the possibility of intra-abdominal pathology.  We will obtain a laboratory evaluation bolus of IV fluids.  We will hold off on narcotics as the family would like to try and abstain.  We will give Bentyl and Tylenol.  Antiemetics.  Reassess.  Patient feeling a bit better on reassessment.  CT scan with concern for intussusception.  She has a significant leukocytosis.  No significant electrolyte abnormality renal function appears to be at baseline.  I discussed case with general surgery, Dr. Marcello Moores recommended medical admission.  Discussed with hospitalist.  The patients results and plan were  reviewed and discussed.   Any x-rays performed were independently reviewed by myself.   Differential diagnosis were considered with the presenting HPI.  Medications  acetaminophen (TYLENOL) tablet 1,000 mg (0 mg Oral Hold 06/28/21 1739)  dicyclomine (BENTYL) capsule 10 mg (10 mg Oral Not Given 06/28/21 1739)  0.9 %  sodium chloride infusion (has no administration in time range)  sodium chloride 0.9 % bolus 1,000 mL (0 mLs Intravenous Stopped 06/28/21 1904)  ondansetron (ZOFRAN) injection 4 mg (4 mg Intravenous Given 06/28/21 1804)  dicyclomine (BENTYL) injection 20 mg (20 mg Intramuscular Given 06/28/21 1750)  iohexol (OMNIPAQUE) 300 MG/ML solution  100 mL (100 mLs Intravenous Contrast Given 06/28/21 1911)    Vitals:   06/28/21 1640 06/28/21 1815 06/28/21 1945  BP: 127/77 (!) 155/62 (!) 130/53  Pulse: 99 89 84  Resp: 16 (!) 22 (!) 26  Temp: 97.9 F (36.6 C)    TempSrc: Oral    SpO2: 100% 98% 99%    Final diagnoses:  Intussusception ([place])    Admission/ observation were discussed with the admitting physician, patient and/or family and they are comfortable with the plan.          Final Clinical Impression(s) / ED Diagnoses Final diagnoses:  None    Rx / DC Orders ED Discharge Orders     None         Deno Etienne, DO 06/28/21 2023

## 2021-06-28 NOTE — Progress Notes (Signed)
Subjective:  Patient ID: Robin Estrada, female    DOB: December 15, 1945  Age: 76 y.o. MRN: [id number]  CC: Follow-up (Patient c/o pain across abdomen, nausea, dry heaving, difficult with bowel movement, mucus in stool)   HPI  Robin Estrada presents for severe diffuse abdominal pain, persistent wt loss, nausea and dry heaves.  She has been having persistent FTT. Pt stopped all pain meds a few days ago because of opioids were given her severe constipation. Pt is refusing opioids for pain control.  She is asking to be admitted to gastroenterology discussed something is wrong with her abdominal gastrointestinal tract.  She does not believe her cancer has anything to do with her pain.  She is here with her son [name], who is helping with the history  No facility-administered medications prior to visit.   Outpatient Medications Prior to Visit  Medication Sig Dispense Refill   acetaminophen (TYLENOL) 500 MG tablet Take 1 tablet (500 mg total) by mouth every 8 (eight) hours as needed for headache. 30 tablet 0   atenolol (TENORMIN) 100 MG tablet Take 1 tablet (100 mg total) by mouth 2 (two) times daily. 180 tablet 3   cholecalciferol (VITAMIN D) 1000 units tablet Take 1 tablet (1,000 Units total) by mouth daily. 100 tablet 3   fentaNYL (DURAGESIC) 12 MCG/HR Place 1 patch onto the skin every 3 (three) days. (Patient not taking: Reported on 06/29/2021) 10 patch 0   Multiple Vitamin (MULTIVITAMIN WITH MINERALS) TABS tablet Take 1 tablet by mouth daily. 30 tablet 0   ondansetron (ZOFRAN) 4 MG tablet Take 1 tablet (4 mg total) by mouth every 8 (eight) hours as needed for nausea. 20 tablet 2   pantoprazole (PROTONIX) 40 MG tablet Take 1 tablet (40 mg total) by mouth 2 (two) times daily before a meal. 60 tablet 2   senna-docusate (SENOKOT-S) 8.6-50 MG tablet Take 1-2 tablets by mouth daily as needed for mild constipation. 100 tablet 3   vitamin B-12 1000 MCG tablet Take 1 tablet (1,000 mcg total) by mouth daily.  30 tablet 0   HYDROcodone-acetaminophen (NORCO/VICODIN) 5-325 MG tablet Take 1 tablet by mouth every 6 (six) hours as needed for moderate pain or severe pain. 120 tablet 0    ROS: Review of Systems  Constitutional:  Positive for activity change, appetite change and unexpected weight change. Negative for chills, fatigue and fever.  HENT:  Positive for voice change. Negative for congestion, mouth sores and sinus pressure.   Eyes:  Negative for visual disturbance.  Respiratory:  Negative for cough and chest tightness.   Gastrointestinal:  Positive for abdominal pain, constipation, nausea and vomiting. Negative for abdominal distention.  Genitourinary:  Negative for difficulty urinating, frequency and vaginal pain.  Musculoskeletal:  Negative for back pain and gait problem.  Skin:  Negative for pallor and rash.  Neurological:  Negative for dizziness, tremors, weakness, numbness and headaches.  Psychiatric/Behavioral:  Positive for dysphoric mood. Negative for confusion and sleep disturbance.    Objective:  BP 114/72 (BP Location: Right Arm, Patient Position: Sitting, Cuff Size: Normal)    Pulse (!) 122    Temp 97.9 F (36.6 C) (Oral)    Ht 5' (1.524 m)    Wt 110 lb 9.6 oz (50.2 kg)    SpO2 98%    BMI 21.60 kg/m   BP Readings from Last 3 Encounters:  06/30/21 (!) 130/48  06/28/21 114/72  06/10/21 (!) 160/72    Wt Readings from Last 3 Encounters:  06/30/21 121  lb 4.1 oz (55 kg)  06/28/21 110 lb 9.6 oz (50.2 kg)  06/10/21 120 lb 12.8 oz (54.8 kg)    Physical Exam Constitutional:      General: She is in acute distress.     Appearance: She is well-developed. She is ill-appearing and toxic-appearing.  HENT:     Head: Normocephalic.     Right Ear: External ear normal.     Left Ear: External ear normal.     Nose: Nose normal.  Eyes:     General:        Right eye: No discharge.        Left eye: No discharge.     Conjunctiva/sclera: Conjunctivae normal.     Pupils: Pupils are equal,  round, and reactive to light.  Neck:     Thyroid: No thyromegaly.     Vascular: No JVD.     Trachea: No tracheal deviation.  Cardiovascular:     Rate and Rhythm: Normal rate and regular rhythm.     Heart sounds: Normal heart sounds.  Pulmonary:     Effort: No respiratory distress.     Breath sounds: No stridor. No wheezing or rhonchi.  Abdominal:     General: Bowel sounds are normal.     Palpations: Abdomen is soft. There is no mass.     Tenderness: There is abdominal tenderness. There is no guarding or rebound.  Musculoskeletal:        General: No tenderness.     Cervical back: Normal range of motion and neck supple. No rigidity.  Lymphadenopathy:     Cervical: No cervical adenopathy.  Skin:    General: Skin is dry.     Findings: No erythema or rash.  Neurological:     Mental Status: She is oriented to person, place, and time.     Cranial Nerves: No cranial nerve deficit.     Motor: No abnormal muscle tone.     Coordination: Coordination normal.     Deep Tendon Reflexes: Reflexes normal.  Psychiatric:        Behavior: Behavior normal.        Thought Content: Thought content normal.        Judgment: Judgment normal.   She is very uncomfortable.  Her skin is grayish in color.  She is doubled over with abdominal pain.  Her mouth is dry.  Her eyes are sunken. Her abdomen is very tender, no masses or hernias.  No clear-cut rebound signs.    A total time of 45 minutes was spent preparing to see the patient, reviewing tests, x-rays, operative reports, pathology reports and other medical records.  Also, obtaining history and performing comprehensive physical exam.  Additionally, counseling the patient and her son regarding the above listed issues-metastatic breast cancer, failure to thrive, severe abdominal pain, role of opioids in treating cancer pain.   Finally, documenting clinical information in the health records, coordination of care with ER, setting realistic expectations. It is  a complex case.   Lab Results  Component Value Date   WBC 17.6 (H) 06/30/2021   HGB 8.5 (L) 06/30/2021   HCT 28.0 (L) 06/30/2021   PLT 302 06/30/2021   GLUCOSE 161 (H) 06/30/2021   CHOL 142 06/30/2021   TRIG 183 (H) 06/30/2021   HDL 30 (L) 06/30/2021   LDLCALC 75 06/30/2021   ALT 9 06/29/2021   AST 17 06/29/2021   NA 134 (L) 06/30/2021   K 3.2 (L) 06/30/2021   CL 101 06/30/2021  CREATININE 0.52 06/30/2021   BUN 6 (L) 06/30/2021   CO2 22 06/30/2021   TSH 3.21 04/01/2021   INR 0.98 09/14/2017   HGBA1C 5.3 06/30/2021    No results found.  Assessment & Plan:   Problem List Items Addressed This Visit     Abdominal pain    Abdominal pain of unclear etiology, severe.  The patient appears toxic.  I told the patient and her son that in my opinion her pain is primarily related to her metastatic cancer which may cause all sorts of gastrointestinal complications.  I advised her to go to the emergency room now for diagnostic work-up and treatment.  They are in agreement.  We will call ER triage.      Malignant phyllodes tumor of breast [facility])     The patient is not convinced yet that her current condition is related to the relapse of her breast cancer that she had 4 years ago.  She wants to wait on the biopsy confirmation report from [place] before she agrees to further treatments. I showed her son [name] available pathology reports, abnormal CT scans etc. that confirm her diagnosis of metastatic cancer       Metastatic breast cancer ([place])    We discussed T[place]ka's diagnosis of metastatic breast cancer and its progression.  As she has been before, the patient is in denial of her diagnosis of metastatic cancer and it's implication on her health.  I told [name], her son, that in my opinion her metastatic cancer is becoming overwhelming.  I told the patient and her son that palliative care or hospice involvement is appropriate at this time.  I told them that she needs to reconsider opioids  because nothing else would probably do the job of her pain control.  They will need to follow-up with Dr. Burr Medico.          No orders of the defined types were placed in this encounter.     Follow-up: No follow-ups on file.  Walker Kehr, MD

## 2021-06-28 NOTE — Assessment & Plan Note (Addendum)
°  The patient is not convinced yet that her current condition is related to the relapse of her breast cancer that she had 4 years ago.  She wants to wait on the biopsy confirmation report from [place] before she agrees to further treatments. I showed her son Robin Estrada available pathology reports, abnormal CT scans etc. that confirm her diagnosis of metastatic cancer

## 2021-06-29 ENCOUNTER — Encounter (HOSPITAL_COMMUNITY): Payer: Self-pay | Admitting: Internal Medicine

## 2021-06-29 ENCOUNTER — Encounter (HOSPITAL_COMMUNITY)
Admission: EM | Disposition: A | Discharge status: Hospice | Payer: Self-pay | Source: Home / Self Care | Attending: Internal Medicine

## 2021-06-29 ENCOUNTER — Inpatient Hospital Stay (HOSPITAL_COMMUNITY): Payer: Medicare HMO | Admitting: Certified Registered"

## 2021-06-29 ENCOUNTER — Encounter: Payer: Self-pay | Admitting: Hematology

## 2021-06-29 ENCOUNTER — Inpatient Hospital Stay (HOSPITAL_COMMUNITY): Payer: Medicare HMO

## 2021-06-29 ENCOUNTER — Inpatient Hospital Stay (HOSPITAL_COMMUNITY)
Admission: EM | Disposition: A | Discharge status: Hospice | Payer: Self-pay | Source: Home / Self Care | Attending: Internal Medicine

## 2021-06-29 DIAGNOSIS — D649 Anemia, unspecified: Secondary | ICD-10-CM | POA: Diagnosis not present

## 2021-06-29 DIAGNOSIS — K561 Intussusception: Secondary | ICD-10-CM | POA: Diagnosis not present

## 2021-06-29 DIAGNOSIS — J449 Chronic obstructive pulmonary disease, unspecified: Secondary | ICD-10-CM

## 2021-06-29 DIAGNOSIS — I10 Essential (primary) hypertension: Secondary | ICD-10-CM

## 2021-06-29 DIAGNOSIS — I213 ST elevation (STEMI) myocardial infarction of unspecified site: Secondary | ICD-10-CM | POA: Diagnosis not present

## 2021-06-29 HISTORY — PX: LAPAROSCOPIC PARTIAL RIGHT COLECTOMY: SHX5913

## 2021-06-29 LAB — POCT I-STAT, CHEM 8
BUN: <3 mg/dL — ABNORMAL LOW (ref 8–23)
Calcium, Ion: 1.15 mmol/L (ref 1.15–1.40)
Chloride: 99 mmol/L (ref 98–111)
Creatinine, Ser: 0.3 mg/dL — ABNORMAL LOW (ref 0.44–1.00)
Glucose, Bld: 92 mg/dL (ref 70–99)
HCT: 26 % — ABNORMAL LOW (ref 36.0–46.0)
Hemoglobin: 8.8 g/dL — ABNORMAL LOW (ref 12.0–15.0)
Potassium: 2.7 mmol/L — CL (ref 3.5–5.1)
Sodium: 134 mmol/L — ABNORMAL LOW (ref 135–145)
TCO2: 22 mmol/L (ref 22–32)

## 2021-06-29 LAB — COMPREHENSIVE METABOLIC PANEL
ALT: 9 U/L (ref 0–44)
AST: 17 U/L (ref 15–41)
Albumin: 2.1 g/dL — ABNORMAL LOW (ref 3.5–5.0)
Alkaline Phosphatase: 132 U/L — ABNORMAL HIGH (ref 38–126)
Anion gap: 10 (ref 5–15)
BUN: 6 mg/dL — ABNORMAL LOW (ref 8–23)
CO2: 23 mmol/L (ref 22–32)
Calcium: 7.8 mg/dL — ABNORMAL LOW (ref 8.9–10.3)
Chloride: 100 mmol/L (ref 98–111)
Creatinine, Ser: 0.51 mg/dL (ref 0.44–1.00)
GFR, Estimated: >60 mL/min (ref >60)
Glucose, Bld: 95 mg/dL (ref 70–99)
Potassium: 3.2 mmol/L — ABNORMAL LOW (ref 3.5–5.1)
Sodium: 133 mmol/L — ABNORMAL LOW (ref 135–145)
Total Bilirubin: 0.7 mg/dL (ref 0.3–1.2)
Total Protein: 5.3 g/dL — ABNORMAL LOW (ref 6.5–8.1)

## 2021-06-29 LAB — CBC WITH DIFFERENTIAL/PLATELET
Abs Immature Granulocytes: 0.24 10*3/uL — ABNORMAL HIGH (ref 0.00–0.07)
Basophils Absolute: 0 10*3/uL (ref 0.0–0.1)
Basophils Relative: 0 %
Eosinophils Absolute: 0.1 10*3/uL (ref 0.0–0.5)
Eosinophils Relative: 1 %
HCT: 27 % — ABNORMAL LOW (ref 36.0–46.0)
Hemoglobin: 8.1 g/dL — ABNORMAL LOW (ref 12.0–15.0)
Immature Granulocytes: 1 %
Lymphocytes Relative: 4 %
Lymphs Abs: 0.7 10*3/uL (ref 0.7–4.0)
MCH: 25.2 pg — ABNORMAL LOW (ref 26.0–34.0)
MCHC: 30 g/dL (ref 30.0–36.0)
MCV: 83.9 fL (ref 80.0–100.0)
Monocytes Absolute: 0.8 10*3/uL (ref 0.1–1.0)
Monocytes Relative: 4 %
Neutro Abs: 16.9 10*3/uL — ABNORMAL HIGH (ref 1.7–7.7)
Neutrophils Relative %: 90 %
Platelets: 333 10*3/uL (ref 150–400)
RBC: 3.22 MIL/uL — ABNORMAL LOW (ref 3.87–5.11)
RDW: 25.7 % — ABNORMAL HIGH (ref 11.5–15.5)
WBC: 18.6 10*3/uL — ABNORMAL HIGH (ref 4.0–10.5)
nRBC: 0 % (ref 0.0–0.2)

## 2021-06-29 LAB — POCT I-STAT EG7
Acid-base deficit: 6 mmol/L — ABNORMAL HIGH (ref 0.0–2.0)
Bicarbonate: 19.8 mmol/L — ABNORMAL LOW (ref 20.0–28.0)
Calcium, Ion: 1.08 mmol/L — ABNORMAL LOW (ref 1.15–1.40)
HCT: 27 % — ABNORMAL LOW (ref 36.0–46.0)
Hemoglobin: 9.2 g/dL — ABNORMAL LOW (ref 12.0–15.0)
O2 Saturation: 81 %
Potassium: 3.1 mmol/L — ABNORMAL LOW (ref 3.5–5.1)
Sodium: 132 mmol/L — ABNORMAL LOW (ref 135–145)
TCO2: 21 mmol/L — ABNORMAL LOW (ref 22–32)
pCO2, Ven: 38.3 mmHg — ABNORMAL LOW (ref 44–60)
pH, Ven: 7.321 (ref 7.25–7.43)
pO2, Ven: 49 mmHg — ABNORMAL HIGH (ref 32–45)

## 2021-06-29 LAB — PREPARE RBC (CROSSMATCH)

## 2021-06-29 LAB — TROPONIN I (HIGH SENSITIVITY)
Troponin I (High Sensitivity): 952 ng/L (ref <18)
Troponin I (High Sensitivity): 14681 ng/L (ref <18)

## 2021-06-29 LAB — MAGNESIUM: Magnesium: 1.8 mg/dL (ref 1.7–2.4)

## 2021-06-29 LAB — LACTIC ACID, PLASMA: Lactic Acid, Venous: 0.8 mmol/L (ref 0.5–1.9)

## 2021-06-29 SURGERY — LAPAROSCOPIC PARTIAL RIGHT COLECTOMY
Anesthesia: General | Site: Abdomen

## 2021-06-29 SURGERY — CORONARY/GRAFT ACUTE MI REVASCULARIZATION
Anesthesia: LOCAL

## 2021-06-29 MED ORDER — EPHEDRINE 5 MG/ML INJ
INTRAVENOUS | Status: AC
  Filled 2021-06-29: qty 5

## 2021-06-29 MED ORDER — SIMETHICONE 80 MG PO CHEW
40.0000 mg | CHEWABLE_TABLET | Freq: Four times a day (QID) | ORAL | Status: DC | PRN
  Filled 2021-06-29: qty 1

## 2021-06-29 MED ORDER — ACETAMINOPHEN 500 MG PO TABS
1000.0000 mg | ORAL_TABLET | Freq: Four times a day (QID) | ORAL | Status: DC
  Administered 2021-06-29 – 2021-06-30 (×3): 1000 mg via ORAL
  Filled 2021-06-29 (×3): qty 2

## 2021-06-29 MED ORDER — HYDROMORPHONE HCL 1 MG/ML IJ SOLN
0.2500 mg | INTRAMUSCULAR | Status: DC | PRN
  Administered 2021-06-29 (×2): 0.5 mg via INTRAVENOUS

## 2021-06-29 MED ORDER — PROPOFOL 1000 MG/100ML IV EMUL
INTRAVENOUS | Status: AC
  Filled 2021-06-29: qty 200

## 2021-06-29 MED ORDER — FENTANYL CITRATE (PF) 100 MCG/2ML IJ SOLN
25.0000 ug | INTRAMUSCULAR | Status: DC | PRN
  Administered 2021-07-01 – 2021-07-02 (×2): 25 ug via INTRAVENOUS
  Filled 2021-06-29 (×2): qty 2

## 2021-06-29 MED ORDER — PROMETHAZINE HCL 25 MG/ML IJ SOLN: 6.2500 mg | INTRAMUSCULAR | Status: DC | PRN

## 2021-06-29 MED ORDER — SODIUM CHLORIDE 0.9% IV SOLUTION: Freq: Once | INTRAVENOUS | Status: DC

## 2021-06-29 MED ORDER — SUCCINYLCHOLINE CHLORIDE 200 MG/10ML IV SOSY
PREFILLED_SYRINGE | INTRAVENOUS | Status: DC | PRN
Start: 2021-06-29 — End: 2021-06-29
  Administered 2021-06-29: 80 mg via INTRAVENOUS

## 2021-06-29 MED ORDER — PROPOFOL 10 MG/ML IV BOLUS
INTRAVENOUS | Status: DC | PRN
  Administered 2021-06-29: 120 mg via INTRAVENOUS

## 2021-06-29 MED ORDER — SODIUM CHLORIDE 0.9 % IV SOLN
10.0000 mL/h | Freq: Once | INTRAVENOUS | Status: AC
  Administered 2021-06-29: 10 mL/h via INTRAVENOUS

## 2021-06-29 MED ORDER — MIDAZOLAM HCL 2 MG/2ML IJ SOLN
INTRAMUSCULAR | Status: DC | PRN
  Administered 2021-06-29: 1 mg via INTRAVENOUS

## 2021-06-29 MED ORDER — ONDANSETRON HCL 4 MG/2ML IJ SOLN
INTRAMUSCULAR | Status: AC
  Filled 2021-06-29: qty 2

## 2021-06-29 MED ORDER — FENTANYL CITRATE (PF) 250 MCG/5ML IJ SOLN
INTRAMUSCULAR | Status: DC | PRN
Start: 2021-06-29 — End: 2021-06-29
  Administered 2021-06-29: 25 ug via INTRAVENOUS
  Administered 2021-06-29: 50 ug via INTRAVENOUS
  Administered 2021-06-29: 75 ug via INTRAVENOUS
  Administered 2021-06-29 (×2): 50 ug via INTRAVENOUS

## 2021-06-29 MED ORDER — ENSURE SURGERY PO LIQD
237.0000 mL | Freq: Two times a day (BID) | ORAL | Status: DC
  Administered 2021-06-30 – 2021-07-01 (×3): 237 mL via ORAL
  Filled 2021-06-29 (×8): qty 237

## 2021-06-29 MED ORDER — POTASSIUM CHLORIDE 10 MEQ/50ML IV SOLN: 10.0000 meq | INTRAVENOUS | Status: DC

## 2021-06-29 MED ORDER — HYDROMORPHONE HCL 1 MG/ML IJ SOLN
0.5000 mg | INTRAMUSCULAR | Status: DC | PRN
  Administered 2021-07-04 – 2021-07-23 (×12): 0.5 mg via INTRAVENOUS
  Filled 2021-06-29 (×2): qty 0.5
  Filled 2021-06-29: qty 1
  Filled 2021-06-29 (×6): qty 0.5
  Filled 2021-06-29 (×2): qty 1
  Filled 2021-06-29 (×4): qty 0.5

## 2021-06-29 MED ORDER — BUPIVACAINE LIPOSOME 1.3 % IJ SUSP
INTRAMUSCULAR | Status: AC
  Filled 2021-06-29: qty 20

## 2021-06-29 MED ORDER — ONDANSETRON HCL 4 MG/2ML IJ SOLN
4.0000 mg | Freq: Four times a day (QID) | INTRAMUSCULAR | Status: DC | PRN
  Administered 2021-07-05 – 2021-07-25 (×2): 4 mg via INTRAVENOUS
  Filled 2021-06-29: qty 2

## 2021-06-29 MED ORDER — LACTATED RINGERS IV SOLN: INTRAVENOUS | Status: DC | PRN

## 2021-06-29 MED ORDER — DIPHENHYDRAMINE HCL 12.5 MG/5ML PO ELIX: 12.5000 mg | ORAL_SOLUTION | Freq: Four times a day (QID) | ORAL | Status: DC | PRN

## 2021-06-29 MED ORDER — ROCURONIUM BROMIDE 10 MG/ML (PF) SYRINGE
PREFILLED_SYRINGE | INTRAVENOUS | Status: DC | PRN
  Administered 2021-06-29: 10 mg via INTRAVENOUS
  Administered 2021-06-29: 40 mg via INTRAVENOUS

## 2021-06-29 MED ORDER — POTASSIUM CHLORIDE 10 MEQ/100ML IV SOLN
10.0000 meq | INTRAVENOUS | Status: AC
  Administered 2021-06-29 (×2): 10 meq via INTRAVENOUS
  Filled 2021-06-29: qty 100

## 2021-06-29 MED ORDER — NITROGLYCERIN IN D5W 200-5 MCG/ML-% IV SOLN
INTRAVENOUS | Status: AC
  Filled 2021-06-29: qty 250

## 2021-06-29 MED ORDER — SODIUM CHLORIDE 0.9 % IR SOLN
Status: DC | PRN
  Administered 2021-06-29: 1000 mL

## 2021-06-29 MED ORDER — ASPIRIN 81 MG PO CHEW
324.0000 mg | CHEWABLE_TABLET | Freq: Once | ORAL | Status: AC
  Administered 2021-06-29: 324 mg via ORAL
  Filled 2021-06-29: qty 4

## 2021-06-29 MED ORDER — BUPIVACAINE-EPINEPHRINE (PF) 0.25% -1:200000 IJ SOLN
INTRAMUSCULAR | Status: AC
  Filled 2021-06-29: qty 30

## 2021-06-29 MED ORDER — SODIUM CHLORIDE 0.9 % IV SOLN: INTRAVENOUS | Status: DC

## 2021-06-29 MED ORDER — PHENYLEPHRINE 40 MCG/ML (10ML) SYRINGE FOR IV PUSH (FOR BLOOD PRESSURE SUPPORT)
PREFILLED_SYRINGE | INTRAVENOUS | Status: AC
  Filled 2021-06-29: qty 10

## 2021-06-29 MED ORDER — DEXAMETHASONE SODIUM PHOSPHATE 10 MG/ML IJ SOLN
INTRAMUSCULAR | Status: AC
  Filled 2021-06-29: qty 1

## 2021-06-29 MED ORDER — MEPERIDINE HCL 50 MG/ML IJ SOLN: 6.2500 mg | INTRAMUSCULAR | Status: DC | PRN

## 2021-06-29 MED ORDER — NITROGLYCERIN IN D5W 200-5 MCG/ML-% IV SOLN: 0.0000 ug/min | INTRAVENOUS | Status: DC

## 2021-06-29 MED ORDER — SUCCINYLCHOLINE CHLORIDE 200 MG/10ML IV SOSY
PREFILLED_SYRINGE | INTRAVENOUS | Status: AC
  Filled 2021-06-29: qty 10

## 2021-06-29 MED ORDER — FENTANYL CITRATE (PF) 100 MCG/2ML IJ SOLN
INTRAMUSCULAR | Status: AC
  Filled 2021-06-29: qty 2

## 2021-06-29 MED ORDER — LIDOCAINE HCL (PF) 2 % IJ SOLN
INTRAMUSCULAR | Status: AC
  Filled 2021-06-29: qty 10

## 2021-06-29 MED ORDER — MIDAZOLAM HCL 2 MG/2ML IJ SOLN
INTRAMUSCULAR | Status: AC
  Filled 2021-06-29: qty 2

## 2021-06-29 MED ORDER — FENTANYL CITRATE (PF) 100 MCG/2ML IJ SOLN
INTRAMUSCULAR | Status: DC | PRN
  Administered 2021-06-29 (×2): 50 ug via INTRAVENOUS

## 2021-06-29 MED ORDER — FENTANYL CITRATE (PF) 250 MCG/5ML IJ SOLN
INTRAMUSCULAR | Status: AC
  Filled 2021-06-29: qty 5

## 2021-06-29 MED ORDER — MIDAZOLAM HCL 2 MG/2ML IJ SOLN: 0.5000 mg | Freq: Once | INTRAMUSCULAR | Status: DC | PRN

## 2021-06-29 MED ORDER — BUPIVACAINE-EPINEPHRINE 0.25% -1:200000 IJ SOLN
INTRAMUSCULAR | Status: DC | PRN
  Administered 2021-06-29: 30 mL

## 2021-06-29 MED ORDER — ASPIRIN EC 81 MG PO TBEC
81.0000 mg | DELAYED_RELEASE_TABLET | Freq: Every day | ORAL | Status: DC
  Administered 2021-06-30 – 2021-07-05 (×3): 81 mg via ORAL
  Filled 2021-06-29 (×5): qty 1

## 2021-06-29 MED ORDER — CHLORHEXIDINE GLUCONATE CLOTH 2 % EX PADS
6.0000 | MEDICATED_PAD | Freq: Every day | CUTANEOUS | Status: DC
  Administered 2021-06-29: 6 via TOPICAL

## 2021-06-29 MED ORDER — LIDOCAINE 2% (20 MG/ML) 5 ML SYRINGE
INTRAMUSCULAR | Status: DC | PRN
Start: 2021-06-29 — End: 2021-06-29
  Administered 2021-06-29: 40 mg via INTRAVENOUS

## 2021-06-29 MED ORDER — OXYCODONE HCL 5 MG/5ML PO SOLN: 5.0000 mg | Freq: Once | ORAL | Status: DC | PRN

## 2021-06-29 MED ORDER — DICYCLOMINE HCL 10 MG/ML IM SOLN
20.0000 mg | Freq: Four times a day (QID) | INTRAMUSCULAR | Status: DC | PRN
  Administered 2021-06-29 – 2021-07-03 (×7): 20 mg via INTRAMUSCULAR
  Filled 2021-06-29 (×14): qty 2

## 2021-06-29 MED ORDER — BUPIVACAINE LIPOSOME 1.3 % IJ SUSP
INTRAMUSCULAR | Status: DC | PRN
  Administered 2021-06-29: 20 mL

## 2021-06-29 MED ORDER — ROCURONIUM BROMIDE 10 MG/ML (PF) SYRINGE
PREFILLED_SYRINGE | INTRAVENOUS | Status: AC
  Filled 2021-06-29: qty 10

## 2021-06-29 MED ORDER — KCL IN DEXTROSE-NACL 20-5-0.45 MEQ/L-%-% IV SOLN: INTRAVENOUS | Status: DC

## 2021-06-29 MED ORDER — ONDANSETRON HCL 4 MG/2ML IJ SOLN
INTRAMUSCULAR | Status: DC | PRN
  Administered 2021-06-29: 4 mg via INTRAVENOUS

## 2021-06-29 MED ORDER — DEXAMETHASONE SODIUM PHOSPHATE 10 MG/ML IJ SOLN
INTRAMUSCULAR | Status: DC | PRN
  Administered 2021-06-29: 4 mg via INTRAVENOUS

## 2021-06-29 MED ORDER — NITROGLYCERIN IN D5W 200-5 MCG/ML-% IV SOLN
5.0000 ug/min | INTRAVENOUS | Status: DC
Start: 2021-06-29 — End: 2021-06-29
  Administered 2021-06-29: 5 ug/min via INTRAVENOUS

## 2021-06-29 MED ORDER — SUGAMMADEX SODIUM 200 MG/2ML IV SOLN
INTRAVENOUS | Status: DC | PRN
  Administered 2021-06-29: 150 mg via INTRAVENOUS

## 2021-06-29 MED ORDER — ACETAMINOPHEN 10 MG/ML IV SOLN
INTRAVENOUS | Status: AC
  Filled 2021-06-29: qty 100

## 2021-06-29 MED ORDER — ACETAMINOPHEN 10 MG/ML IV SOLN
1000.0000 mg | Freq: Once | INTRAVENOUS | Status: AC
  Administered 2021-06-29: 1000 mg via INTRAVENOUS

## 2021-06-29 MED ORDER — HYDROMORPHONE HCL 1 MG/ML IJ SOLN
INTRAMUSCULAR | Status: AC
  Filled 2021-06-29: qty 2

## 2021-06-29 MED ORDER — CHLORHEXIDINE GLUCONATE CLOTH 2 % EX PADS
6.0000 | MEDICATED_PAD | Freq: Every day | CUTANEOUS | Status: DC
  Administered 2021-06-29 – 2021-07-06 (×7): 6 via TOPICAL

## 2021-06-29 MED ORDER — PHENYLEPHRINE HCL (PRESSORS) 10 MG/ML IV SOLN
INTRAVENOUS | Status: AC
  Filled 2021-06-29: qty 1

## 2021-06-29 MED ORDER — DIPHENHYDRAMINE HCL 50 MG/ML IJ SOLN
12.5000 mg | Freq: Four times a day (QID) | INTRAMUSCULAR | Status: DC | PRN
  Administered 2021-07-04: 12.5 mg via INTRAVENOUS
  Filled 2021-06-29: qty 1

## 2021-06-29 MED ORDER — POTASSIUM CHLORIDE IN NACL 20-0.9 MEQ/L-% IV SOLN
INTRAVENOUS | Status: DC
  Filled 2021-06-29: qty 1000

## 2021-06-29 MED ORDER — OXYCODONE HCL 5 MG PO TABS: 5.0000 mg | ORAL_TABLET | Freq: Once | ORAL | Status: DC | PRN

## 2021-06-29 MED ORDER — LACTATED RINGERS IR SOLN
Status: DC | PRN
  Administered 2021-06-29: 1000 mL

## 2021-06-29 MED ORDER — ALUM & MAG HYDROXIDE-SIMETH 200-200-20 MG/5ML PO SUSP: 30.0000 mL | Freq: Four times a day (QID) | ORAL | Status: DC | PRN

## 2021-06-29 MED ORDER — ENOXAPARIN SODIUM 40 MG/0.4ML IJ SOSY
40.0000 mg | PREFILLED_SYRINGE | INTRAMUSCULAR | Status: DC
  Administered 2021-06-30: 40 mg via SUBCUTANEOUS
  Filled 2021-06-29: qty 0.4

## 2021-06-29 MED ORDER — ONDANSETRON HCL 4 MG PO TABS: 4.0000 mg | ORAL_TABLET | Freq: Four times a day (QID) | ORAL | Status: DC | PRN

## 2021-06-29 MED ORDER — PRAVASTATIN SODIUM 20 MG PO TABS
20.0000 mg | ORAL_TABLET | Freq: Every day | ORAL | Status: DC
  Administered 2021-06-29 – 2021-07-01 (×3): 20 mg via ORAL
  Filled 2021-06-29 (×3): qty 1

## 2021-06-29 MED FILL — Medication: Qty: 1 | Status: AC

## 2021-06-29 SURGICAL SUPPLY — 68 items
APPLIER CLIP 5 13 M/L LIGAMAX5 (MISCELLANEOUS)
BAG COUNTER SPONGE SURGICOUNT (BAG) ×2 IMPLANT
BLADE EXTENDED COATED 6.5IN (ELECTRODE) IMPLANT
CABLE HIGH FREQUENCY MONO STRZ (ELECTRODE) IMPLANT
CELLS DAT CNTRL 66122 CELL SVR (MISCELLANEOUS) IMPLANT
CHLORAPREP W/TINT 26 (MISCELLANEOUS) ×2 IMPLANT
CLIP APPLIE 5 13 M/L LIGAMAX5 (MISCELLANEOUS) IMPLANT
DERMABOND ADVANCED (GAUZE/BANDAGES/DRESSINGS) ×1
DERMABOND ADVANCED .7 DNX12 (GAUZE/BANDAGES/DRESSINGS) ×1 IMPLANT
DRAIN CHANNEL 19F RND (DRAIN) IMPLANT
DRAPE LAPAROSCOPIC ABDOMINAL (DRAPES) ×2 IMPLANT
DRAPE SURG IRRIG POUCH 19X23 (DRAPES) ×2 IMPLANT
DRSG OPSITE POSTOP 4X10 (GAUZE/BANDAGES/DRESSINGS) IMPLANT
DRSG OPSITE POSTOP 4X6 (GAUZE/BANDAGES/DRESSINGS) ×1 IMPLANT
DRSG OPSITE POSTOP 4X8 (GAUZE/BANDAGES/DRESSINGS) IMPLANT
ELECT REM PT RETURN 15FT ADLT (MISCELLANEOUS) ×2 IMPLANT
EVACUATOR SILICONE 100CC (DRAIN) IMPLANT
GAUZE SPONGE 4X4 12PLY STRL (GAUZE/BANDAGES/DRESSINGS) IMPLANT
GLOVE SURG ENC MOIS LTX SZ6.5 (GLOVE) ×4 IMPLANT
GLOVE SURG UNDER LTX SZ6.5 (GLOVE) ×2 IMPLANT
GLOVE SURG UNDER POLY LF SZ7 (GLOVE) ×4 IMPLANT
GOWN STRL REUS W/TWL XL LVL3 (GOWN DISPOSABLE) ×12 IMPLANT
GRASPER ENDOPATH ANVIL 10MM (MISCELLANEOUS) IMPLANT
HOLDER FOLEY CATH W/STRAP (MISCELLANEOUS) ×2 IMPLANT
IRRIG SUCT STRYKERFLOW 2 WTIP (MISCELLANEOUS) ×2
IRRIGATION SUCT STRKRFLW 2 WTP (MISCELLANEOUS) ×1 IMPLANT
KIT TURNOVER KIT A (KITS) IMPLANT
PACK COLON (CUSTOM PROCEDURE TRAY) ×2 IMPLANT
PAD POSITIONING PINK XL (MISCELLANEOUS) ×2 IMPLANT
PENCIL SMOKE EVACUATOR (MISCELLANEOUS) IMPLANT
PROTECTOR NERVE ULNAR (MISCELLANEOUS) ×2 IMPLANT
RELOAD PROXIMATE 75MM BLUE (ENDOMECHANICALS) ×4 IMPLANT
RELOAD STAPLE 75 3.8 BLU REG (ENDOMECHANICALS) IMPLANT
RETRACTOR WND ALEXIS 18 MED (MISCELLANEOUS) IMPLANT
RTRCTR WOUND ALEXIS 18CM MED (MISCELLANEOUS)
SCISSORS LAP 5X35 DISP (ENDOMECHANICALS) ×2 IMPLANT
SEALER TISSUE G2 STRG ARTC 35C (ENDOMECHANICALS) ×2 IMPLANT
SET TUBE SMOKE EVAC HIGH FLOW (TUBING) ×2 IMPLANT
SLEEVE XCEL OPT CAN 5 100 (ENDOMECHANICALS) ×2 IMPLANT
SPIKE FLUID TRANSFER (MISCELLANEOUS) ×2 IMPLANT
SPONGE DRAIN TRACH 4X4 STRL 2S (GAUZE/BANDAGES/DRESSINGS) IMPLANT
SPONGE T-LAP 18X18 ~~LOC~~+RFID (SPONGE) IMPLANT
STAPLER PROXIMATE 75MM BLUE (STAPLE) ×1 IMPLANT
SURGILUBE 2OZ TUBE FLIPTOP (MISCELLANEOUS) ×2 IMPLANT
SUT ETHILON 2 0 PS N (SUTURE) IMPLANT
SUT NOVA 1 T20/GS 25DT (SUTURE) ×2 IMPLANT
SUT NOVA NAB DX-16 0-1 5-0 T12 (SUTURE) ×4 IMPLANT
SUT PROLENE 2 0 KS (SUTURE) IMPLANT
SUT PROLENE 2 0 SH DA (SUTURE) ×1 IMPLANT
SUT SILK 2 0 (SUTURE) ×2
SUT SILK 2 0 SH CR/8 (SUTURE) IMPLANT
SUT SILK 2-0 18XBRD TIE 12 (SUTURE) ×1 IMPLANT
SUT SILK 3 0 (SUTURE)
SUT SILK 3 0 SH CR/8 (SUTURE) ×2 IMPLANT
SUT SILK 3-0 18XBRD TIE 12 (SUTURE) IMPLANT
SUT VIC AB 2-0 SH 18 (SUTURE) ×2 IMPLANT
SUT VIC AB 4-0 PS2 27 (SUTURE) ×2 IMPLANT
SUT VICRYL 0 UR6 27IN ABS (SUTURE) ×2 IMPLANT
SYS LAPSCP GELPORT 120MM (MISCELLANEOUS)
SYS WOUND ALEXIS 18CM MED (MISCELLANEOUS) ×2
SYSTEM LAPSCP GELPORT 120MM (MISCELLANEOUS) IMPLANT
SYSTEM WOUND ALEXIS 18CM MED (MISCELLANEOUS) ×1 IMPLANT
TOWEL OR NON WOVEN STRL DISP B (DISPOSABLE) ×2 IMPLANT
TRAY FOLEY MTR SLVR 16FR STAT (SET/KITS/TRAYS/PACK) IMPLANT
TROCAR BLADELESS OPT 5 100 (ENDOMECHANICALS) ×2 IMPLANT
TROCAR XCEL 12X100 BLDLESS (ENDOMECHANICALS) ×1 IMPLANT
TROCAR XCEL BLUNT TIP 100MML (ENDOMECHANICALS) IMPLANT
TUBING CONNECTING 10 (TUBING) ×2 IMPLANT

## 2021-06-29 NOTE — Anesthesia Postprocedure Evaluation (Addendum)
Anesthesia Post Note  Patient: Robin Estrada  Procedure(s) Performed: LAPAROSCOPIC PARTIAL RIGHT COLECTOMY WITH END ILEOSTOMY (Abdomen)     Patient location during evaluation: PACU Anesthesia Type: General Level of consciousness: awake and alert, oriented and patient cooperative Pain management: pain level controlled Vital Signs Assessment: post-procedure vital signs reviewed and stable Respiratory status: spontaneous breathing, nonlabored ventilation, respiratory function stable and patient connected to nasal cannula oxygen Cardiovascular status: blood pressure returned to baseline and stable Postop Assessment: no apparent nausea or vomiting Anesthetic complications: no Comments: Suspect pt having STEMI. Pt with VT/Vfib arrest, required CPR, Defib with ROSC within 90 sec. STEMI care with transfer to [facility] declined because pt immed post op. Report to ICU team Robin Estrada, Robin Estrada), who is accepting care of patient along with Cards consultation (Robin Estrada).  Discussed with patient and her son, who understand plan, all questions answered. Jenita Seashore, MD   No notable events documented.  Last Vitals:  Vitals:   06/29/21 1500 06/29/21 1515  BP: (!) 154/85 (!) 144/85  Pulse:    Resp: 18 18  Temp:    SpO2: 92% 92%    Last Pain:  Vitals:   06/29/21 1230  TempSrc:   PainSc: Asleep                 MIRARCHI,ANGELA

## 2021-06-29 NOTE — Consult Note (Signed)
NAME:  Robin Estrada, MRN:  [id number], DOB:  16-Mar-1946, LOS: 1 ADMISSION DATE:  06/28/2021, CONSULTATION DATE:  07-27-22 REFERRING MD:  Doristine Bosworth, [facility], CHIEF COMPLAINT:  post op arrest/MI   History of Present Illness:  76 yo [name] never smoker with FTT since 01/2021 per son with weakness/ anemia and ultimately dx ? Met bone dz ? Primary  and  intussusception >LAPAROSCOPIC PARTIAL RIGHT COLECTOMY WITH END ILEOSTOMY am 2022-07-27  and coded in PACU  with dx of STEMI  turned down for intervention but request by son was to do every thing else so PCCM called pm 07/27/2022 with pt hemodynamically stable on low flow 02.    Pertinent  Medical History   history of Breast mass, left, Cancer ([place]), GI bleed (05/03/2021), Headache, Heart murmur, Hypertension, Mitral valve prolapse, and Tuberculosis.   Significant Hospital Events: Including procedures, antibiotic start and stop dates in addition to other pertinent events    STEMI  07-27-2022 p LAPAROSCOPIC PARTIAL RIGHT COLECTOMY WITH END ILEOSTOMY   Interim History / Subjective:  Denies cp or sob   Objective   Blood pressure (!) 158/76, pulse (!) 108, temperature 97.7 F (36.5 C), resp. rate (!) 22, SpO2 100 %.        Intake/Output Summary (Last 24 hours) at 07/27/21 1652 Last data filed at 07/27/2021 1500 Gross per 24 hour  Intake 3766.89 ml  Output 1150 ml  Net 2616.89 ml   There were no vitals filed for this visit.  Examination: Tmax General: elderly pale somber wf lying back at 30 degrees hob  HENT: orophx clear  Lungs: lungs clear bilaterally  Cardiovascular: RRR  no pressors  Abdomen: soft post op changes  Extremities: warms s edema Neuro: intact sensorium per son    Cxr portable 2022/07/27 p code 1. No significant pneumothorax or obvious displaced rib fracture in single AP portable view.  2. Small volume pneumoperitoneum underlying the right hemidiaphragm.  Assessment & Plan:  1)  STEMI post op rx with NTG / hep as tol by cards s  complications so far but at high risk   2) Acute on chronic anemia with threshold to tx < 8 gm in this setting   Lab Results  Component Value Date   HGB 9.2 (L) 2021/07/27   HGB 8.8 (L) 2021/07/27   HGB 8.1 (L) 07-27-21   HGB 12.3 11/09/2017    3) metastatic bone dz ? Primary    Best Practice (right click and "Reselect all SmartList Selections" daily)   Per [facility] for now  Labs   CBC: Recent Labs  Lab 06/28/21 1644 06/28/21 1758 07/27/21 0623 27-Jul-2021 1016 2021/07/27 1340  WBC 21.4*  --  18.6*  --   --   NEUTROABS 19.6*  --  16.9*  --   --   HGB 9.3* 10.9* 8.1* 8.8* 9.2*  HCT 30.8* 32.0* 27.0* 26.0* 27.0*  MCV 82.4  --  83.9  --   --   PLT 383  --  333  --   --     Basic Metabolic Panel: Recent Labs  Lab 06/28/21 1644 06/28/21 1758 2021-07-27 0623 July 27, 2021 1016 07-27-21 1340  NA 130* 132* 133* 134* 132*  K 3.8 3.4* 3.2* 2.7* 3.1*  CL 95* 94* 100 99  --   CO2 23  --  23  --   --   GLUCOSE 109* 107* 95 92  --   BUN 8 8 6* <3*  --   CREATININE 0.69 0.50 0.51  0.30*  --   CALCIUM 8.4*  --  7.8*  --   --   MG  --   --  1.8  --   --    GFR: Estimated Creatinine Clearance: 43 mL/min (A) (by C-G formula based on SCr of 0.3 mg/dL (L)). Recent Labs  Lab 06/28/21 1644 06/28/21 2028 06/29/21 0623  WBC 21.4*  --  18.6*  LATICACIDVEN  --  1.2 0.8    Liver Function Tests: Recent Labs  Lab 06/28/21 1644 06/29/21 0623  AST 24 17  ALT 8 9  ALKPHOS 165* 132*  BILITOT 1.9* 0.7  PROT 6.1* 5.3*  ALBUMIN 2.5* 2.1*   Recent Labs  Lab 06/28/21 1644  LIPASE 27   No results for input(s): AMMONIA in the last 168 hours.  ABG    Component Value Date/Time   HCO3 19.8 (L) 06/29/2021 1340   TCO2 21 (L) 06/29/2021 1340   ACIDBASEDEF 6.0 (H) 06/29/2021 1340   O2SAT 81 06/29/2021 1340     Coagulation Profile: No results for input(s): INR, PROTIME in the last 168 hours.  Cardiac Enzymes: No results for input(s): CKTOTAL, CKMB, CKMBINDEX, TROPONINI in the last  168 hours.  HbA1C: Hgb A1c MFr Bld  Date/Time Value Ref Range Status  08/30/2020 10:04 AM 6.4 4.6 - 6.5 % Final    Comment:    Glycemic Control Guidelines for People with Diabetes:Non Diabetic:  <6%Goal of Therapy: <7%Additional Action Suggested:  >8%   05/22/2017 08:26 AM 6.4 4.6 - 6.5 % Final    Comment:    Glycemic Control Guidelines for People with Diabetes:Non Diabetic:  <6%Goal of Therapy: <7%Additional Action Suggested:  >8%     CBG: No results for input(s): GLUCAP in the last 168 hours.     Past Medical History:  She,  has a past medical history of Breast mass, left, Cancer ([place]), GI bleed (05/03/2021), Headache, Heart murmur, Hypertension, Mitral valve prolapse, and Tuberculosis.   Surgical History:   Past Surgical History:  Procedure Laterality Date   BIOPSY  05/04/2021   Procedure: BIOPSY;  Surgeon: Rush Landmark Telford Nab., MD;  Location: [place];  Service: Gastroenterology;;   BRONCHIAL BIOPSY  05/07/2021   Procedure: BRONCHIAL BIOPSIES;  Surgeon: Margaretha Seeds, MD;  Location: [facility] ENDOSCOPY;  Service: Pulmonary;;   BRONCHIAL BRUSHINGS  05/07/2021   Procedure: BRONCHIAL BRUSHINGS;  Surgeon: Margaretha Seeds, MD;  Location: [facility] ENDOSCOPY;  Service: Pulmonary;;   BRONCHIAL NEEDLE ASPIRATION BIOPSY  05/07/2021   Procedure: BRONCHIAL NEEDLE ASPIRATION BIOPSIES;  Surgeon: Margaretha Seeds, MD;  Location: [facility] ENDOSCOPY;  Service: Pulmonary;;   BRONCHIAL WASHINGS  05/07/2021   Procedure: BRONCHIAL WASHINGS;  Surgeon: Margaretha Seeds, MD;  Location: [place];  Service: Pulmonary;;   COLONOSCOPY WITH PROPOFOL N/A 05/09/2021   Procedure: COLONOSCOPY WITH PROPOFOL;  Surgeon: Ladene Artist, MD;  Location: [facility] ENDOSCOPY;  Service: Endoscopy;  Laterality: N/A;   ESOPHAGOGASTRODUODENOSCOPY N/A 05/04/2021   Procedure: ESOPHAGOGASTRODUODENOSCOPY (EGD);  Surgeon: Irving Copas., MD;  Location: [place];  Service: Gastroenterology;  Laterality: N/A;    HEMOSTASIS CONTROL  05/07/2021   Procedure: HEMOSTASIS CONTROL;  Surgeon: Margaretha Seeds, MD;  Location[facility] ENDOSCOPY;  Service: Pulmonary;;   MASS EXCISION Left 08/14/2017   Procedure: PARTIAL EXCISION  LEFT BREAST  MASS ERAS PATHWAY;  Surgeon: Fanny Skates, MD;  Location: [place];  Service: General;  Laterality: Left;   MASTECTOMY W/ SENTINEL NODE BIOPSY Left 09/14/2017   Procedure: LEFT TOTAL MASTECTOMY WITH SENTINEL LYMPH  NODE BIOPSY;  Surgeon: Fanny Skates, MD;  Location: [place];  Service: General;  Laterality: Left;   NO PAST SURGERIES     POLYPECTOMY  05/09/2021   Procedure: POLYPECTOMY;  Surgeon: Ladene Artist, MD;  Location: [facility] ENDOSCOPY;  Service: Endoscopy;;   SUBMUCOSAL TATTOO INJECTION  05/09/2021   Procedure: SUBMUCOSAL TATTOO INJECTION;  Surgeon: Ladene Artist, MD;  Location: [facility] ENDOSCOPY;  Service: Endoscopy;;   VIDEO BRONCHOSCOPY WITH ENDOBRONCHIAL ULTRASOUND N/A 05/07/2021   Procedure: VIDEO BRONCHOSCOPY WITH ENDOBRONCHIAL ULTRASOUND;  Surgeon: Margaretha Seeds, MD;  Location: [place];  Service: Pulmonary;  Laterality: N/A;     Social History:   reports that she has never smoked. She has never used smokeless tobacco. She reports current alcohol use. She reports that she does not use drugs.   Family History:  Her family history includes Breast cancer in her sister; Stroke in her maternal grandmother and mother.   Allergies Allergies  Allergen Reactions   Statins Nausea And Vomiting and Other (See Comments)    MUSCLE PAIN   Amlodipine     fatigue   Clonidine Derivatives     headache   Losartan     abd pain   Sulfa Antibiotics Nausea Only   Sulfamethoxazole-Trimethoprim Nausea Only    " Severe Nausea "     Home Medications  Prior to Admission medications   Medication Sig Start Date End Date Taking? Authorizing Provider  acetaminophen (TYLENOL) 500 MG tablet Take 1 tablet (500 mg total) by mouth every 8 (eight) hours as needed for headache. 05/26/21   Yes Thurnell Lose, MD  atenolol (TENORMIN) 100 MG tablet Take 1 tablet (100 mg total) by mouth 2 (two) times daily. 06/05/21  Yes Plotnikov, Evie Lacks, MD  cholecalciferol (VITAMIN D) 1000 units tablet Take 1 tablet (1,000 Units total) by mouth daily. 08/01/16  Yes Plotnikov, Evie Lacks, MD  Multiple Vitamin (MULTIVITAMIN WITH MINERALS) TABS tablet Take 1 tablet by mouth daily. 05/27/21  Yes Thurnell Lose, MD  ondansetron (ZOFRAN) 4 MG tablet Take 1 tablet (4 mg total) by mouth every 8 (eight) hours as needed for nausea. 06/10/21  Yes Plotnikov, Evie Lacks, MD  pantoprazole (PROTONIX) 40 MG tablet Take 1 tablet (40 mg total) by mouth 2 (two) times daily before a meal. 05/26/21  Yes Thurnell Lose, MD  senna-docusate (SENOKOT-S) 8.6-50 MG tablet Take 1-2 tablets by mouth daily as needed for mild constipation. 06/10/21 06/10/22 Yes Plotnikov, Evie Lacks, MD  vitamin B-12 1000 MCG tablet Take 1 tablet (1,000 mcg total) by mouth daily. 05/27/21  Yes Thurnell Lose, MD  fentaNYL (DURAGESIC) 12 MCG/HR Place 1 patch onto the skin every 3 (three) days. Patient not taking: Reported on 06/29/2021 06/10/21   Plotnikov, Evie Lacks, MD      The patient is critically ill with multiple organ systems failure and requires high complexity decision making for assessment and support, frequent evaluation and titration of therapies, application of advanced monitoring technologies and extensive interpretation of multiple databases. Critical Care Time devoted to patient care services described in this note is 45 minutes.   Christinia Gully, MD Pulmonary and [place] [phone]   After 7:00 pm call Elink  [phone]

## 2021-06-29 NOTE — Anesthesia Procedure Notes (Signed)
Central Venous Catheter Insertion Performed by: Annye Asa, MD, anesthesiologist Start/End2/18/2023 10:02 AM, 06/29/2021 10:14 AM Patient location: OR. Preanesthetic checklist: patient identified, IV checked, risks and benefits discussed, surgical consent, monitors and equipment checked, pre-op evaluation, timeout performed and anesthesia consent Position: supine Hand hygiene performed , maximum sterile barriers used  and Seldinger technique used Catheter size: 8 Fr Total catheter length 16. Central line was placed.Double lumen Procedure performed using ultrasound guided technique. Ultrasound Notes:anatomy identified, needle tip was noted to be adjacent to the nerve/plexus identified, no ultrasound evidence of intravascular and/or intraneural injection and image(s) printed for medical record Attempts: 1 Following insertion, line sutured, dressing applied and Biopatch. Post procedure assessment: blood return through all ports, free fluid flow and no air  Patient tolerated the procedure well with no immediate complications. Additional procedure comments: CVP: Timeout, sterile prep, drape, FBP R neck.  Supine position.  1% lido local, finder and trocar RIJ 1st pass with US guidance.  2 lumen placed over J wire. Biopatch and sterile dressing on.  Patient tolerated well.  VSS.  Jenita Seashore, MD .

## 2021-06-29 NOTE — Progress Notes (Deleted)
PROGRESS NOTE    Robin Estrada  ZHY:[id number] DOB: 01-26-1946 DOA: 06/28/2021 PCP: [name], MD   Brief Narrative:  76 year old [name] with a history of lung cancer, history of breast cancer, new vertebral masses presents the ER today with a several day history of worsening abdominal pain, nausea. Labs showed a white count of 21.4, hemoglobin of 9.3, platelets of 383. Chemistry sodium 130, BUN of 8, creatinine 0.79. UA is essentially negative.   CT abdomen with IV contrast showed large intussusception of the cecum and distal ileum within the length of the ascending colon and subsequent dilatation of the ascending colon. There are also noted various and numerous lytic lesions throughout the thoracolumbar spine and pelvis consistent with metastatic disease. General surgery was consulted.  Patient started on broad-spectrum antibiotics and [facility] hospitalist consulted for admission for further evaluation and management.    Assessment & Plan:  Cecal and distal ileum intussusception with bowel obstruction: -Patient presented with severe abdominal pain -She is afebrile with leukocytosis of 21.4.  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   -  Patient underwent CT-guided lung biopsy on 05/14/2021-results consistent of poorly differentiated malignancy with sarcomatoid and epithelioid features.   -Continue as needed pain medications  Hypokalemia: Replenished.  Magnesium level: WNL -Repeat BMP tomorrow a.m.  History of GI bleed: -Resume Protonix after  the surgery.  Avoid NSAIDs monitor H&H closely  Hypertension: Stable -Resume atenolol after the surgery  Anemia of chronic disease: -H&H is stable.  Monitor H&H closely after the surgery.  Transfuse as needed  Aortic atherosclerosis: Noted on recent CT  Cystic mass of pancreas: -Previous CA 19-9 level normal. -Follow-up outpatient  Patient is at increased risk of mortality and morbidity due to history of metastatic breast cancer.   Addendum: Received call from the PACU that patient coded due to Vtac/vfib.  I called on call PCCM Dr. Melvyn Novas and discussed about the patient.  Patient will be transferred to ICU for close monitoring and further management.  DVT prophylaxis: SCD Code Status: Full code Family Communication:  None present at bedside.  Plan of care discussed with patient in length and she verbalized understanding and agreed with it. Disposition Plan: To be determined  Consultants:  General surgery PCCM  Procedures:  Laparoscopic c left right colectomy with end ileostomy  Antimicrobials:  Zosyn  Status is: Inpatient     Subjective: Patient seen and examined.  [place] interpreter used for the history.  General surgery at the bedside.  Discussed about risk and benefits of the surgery.  Patient continues to have abdominal pain however she denies nausea or vomiting or  fever.  Willing to proceed with surgery.  Objective: Vitals:   06/29/21 1215 06/29/21 1219 06/29/21 1230 06/29/21 1245  BP: (!) 124/106 127/60 125/62 124/63  Pulse: 79 74 75 82  Resp: 12 15 15 17   Temp:   98.1 F (36.7 C)   TempSrc:      SpO2: 99% 100% 99% 98%    Intake/Output Summary (Last 24 hours) at 06/29/2021 1326 Last data filed at 06/29/2021 1154 Gross per 24 hour  Intake 3766.89  ml  Output 550 ml  Net 3216.89 ml    There were no vitals filed for this visit.  Examination:  General exam: Appears calm and comfortable, on room air Respiratory system: Clear to auscultation. Respiratory  effort normal. Cardiovascular system: S1 & S2 heard, RRR. No JVD, murmurs, rubs, gallops or clicks. No pedal edema. Gastrointestinal system: Abdomen is soft, right upper abdominal tenderness positive, no guarding, no rigidity.   Central nervous system: Alert and oriented. No focal neurological deficits. Skin: No rashes, lesions or ulcers Psychiatry: Judgement and insight appear normal. Mood & affect appropriate.    Data Reviewed: I have personally reviewed following labs and imaging studies  CBC: Recent Labs  Lab 06/28/21 1644 06/28/21 1758 06/29/21 0623 06/29/21 1016  WBC 21.4*  --  18.6*  --   NEUTROABS 19.6*  --  16.9*  --   HGB 9.3* 10.9* 8.1* 8.8*  HCT 30.8* 32.0* 27.0* 26.0*  MCV 82.4  --  83.9  --   PLT 383  --  333  --     Basic Metabolic Panel: Recent Labs  Lab 06/28/21 1644 06/28/21 1758 06/29/21 0623 06/29/21 1016  NA 130* 132* 133* 134*  K 3.8 3.4* 3.2* 2.7*  CL 95* 94* 100 99  CO2 23  --  23  --   GLUCOSE 109* 107* 95 92  BUN 8 8 6* <3*  CREATININE 0.69 0.50 0.51 0.30*  CALCIUM 8.4*  --  7.8*  --   MG  --   --  1.8  --     GFR: Estimated Creatinine Clearance: 43 mL/min (A) (by C-G formula based on SCr of 0.3 mg/dL (L)). Liver Function Tests: Recent Labs  Lab 06/28/21 1644 06/29/21 0623  AST 24 17  ALT 8 9  ALKPHOS 165* 132*  BILITOT 1.9* 0.7  PROT 6.1* 5.3*  ALBUMIN 2.5* 2.1*    Recent Labs  Lab 06/28/21 1644  LIPASE 27    No results for input(s): AMMONIA in the last 168 hours. Coagulation Profile: No results for input(s): INR, PROTIME in the last 168 hours. Cardiac Enzymes: No results for input(s): CKTOTAL, CKMB, CKMBINDEX, TROPONINI in the last 168 hours. BNP (last 3 results) No results for input(s): PROBNP in the last 8760 hours. HbA1C: No results for input(s): HGBA1C in the last 72 hours. CBG: No results for input(s): GLUCAP in the last 168 hours. Lipid Profile: No results for input(s): CHOL, HDL, LDLCALC, TRIG, CHOLHDL,  LDLDIRECT in the last 72 hours. Thyroid Function Tests: No results for input(s): TSH, T4TOTAL, FREET4, T3FREE, THYROIDAB in the last 72 hours. Anemia Panel: Recent Labs    06/28/21 1658  TIBC 132*  IRON 47    Sepsis Labs: Recent Labs  Lab 06/28/21 2028 06/29/21 3875  LATICACIDVEN 1.2 0.8     Recent Results (from the past 240 hour(s))  Resp Panel by RT-PCR (Flu A&B, Covid) Nasopharyngeal Swab     Status: None   Collection Time: 06/28/21  8:00 PM   Specimen: Nasopharyngeal Swab; Nasopharyngeal(NP) swabs in vial transport medium  Result Value Ref Range Status   SARS Coronavirus 2 by RT PCR NEGATIVE NEGATIVE Final    Comment: (NOTE) SARS-CoV-2 target nucleic acids are NOT DETECTED.  The SARS-CoV-2 RNA is generally detectable in upper respiratory specimens during the acute phase of infection. The lowest concentration of SARS-CoV-2 viral copies this assay can detect is 138 copies/mL. A negative result does not preclude SARS-Cov-2 infection and should not be used as the sole basis  for treatment or other patient management decisions. A negative result may occur with  improper specimen collection/handling, submission of specimen other than nasopharyngeal swab, presence of viral mutation(s) within the areas targeted by this assay, and inadequate number of viral copies(<138 copies/mL). A negative result must be combined with clinical observations, patient history, and epidemiological information. The expected result is Negative.  Fact Sheet for Patients:  EntrepreneurPulse.com.au  Fact Sheet for Healthcare Providers:  IncredibleEmployment.be  This test is no t yet approved or cleared by the [place] FDA and  has been authorized for detection and/or diagnosis of SARS-CoV-2 by FDA under an Emergency Use Authorization (EUA). This EUA will remain  in effect (meaning this test can be used) for the duration of the COVID-19 declaration under  Section 564(b)(1) of the Act, 21 U.S.C.section 360bbb-3(b)(1), unless the authorization is terminated  or revoked sooner.       Influenza A by PCR NEGATIVE NEGATIVE Final   Influenza B by PCR NEGATIVE NEGATIVE Final    Comment: (NOTE) The Xpert Xpress SARS-CoV-2/FLU/RSV plus assay is intended as an aid in the diagnosis of influenza from Nasopharyngeal swab specimens and should not be used as a sole basis for treatment. Nasal washings and aspirates are unacceptable for Xpert Xpress SARS-CoV-2/FLU/RSV testing.  Fact Sheet for Patients: EntrepreneurPulse.com.au  Fact Sheet for Healthcare Providers: IncredibleEmployment.be  This test is not yet approved or cleared by the [place] FDA and has been authorized for detection and/or diagnosis of SARS-CoV-2 by FDA under an Emergency Use Authorization (EUA). This EUA will remain in effect (meaning this test can be used) for the duration of the COVID-19 declaration under Section 564(b)(1) of the Act, 21 U.S.C. section 360bbb-3(b)(1), unless the authorization is terminated or revoked.  Performed at [facility], [place] [address], [place], [place] [address]        Radiology Studies: CT ABDOMEN PELVIS W CONTRAST  Result Date: 06/28/2021 CLINICAL DATA:  Abdominal pain and nausea x5 days. EXAM: CT ABDOMEN AND PELVIS WITH CONTRAST TECHNIQUE: Multidetector CT imaging of the abdomen and pelvis was performed using the standard protocol following bolus administration of intravenous contrast. RADIATION DOSE REDUCTION: This exam was performed according to the departmental dose-optimization program which includes automated exposure control, adjustment of the mA and/or kV according to patient size and/or use of iterative reconstruction technique. CONTRAST:  15mL OMNIPAQUE IOHEXOL 300 MG/ML  SOLN COMPARISON:  May 03, 2021 FINDINGS: Lower chest: No acute abnormality. Hepatobiliary: No focal  liver abnormality is seen. No gallstones, gallbladder wall thickening, or biliary dilatation. Pancreas: A stable 2.5 cm x 1.8 cm cystic appearing structure is seen within the tail of the pancreas (axial CT image 27, CT series 2). Spleen: Normal in size without focal abnormality. Adrenals/Urinary Tract: Adrenal glands are unremarkable. Kidneys are normal, without renal calculi or hydronephrosis. Multiple subcentimeter simple cysts are seen within both kidneys. The largest measures approximately 12 mm in diameter. Bladder is unremarkable. Stomach/Bowel: Stomach is within normal limits. The appendix is poorly visualized. There is intussusception of the cecum and distal ileum within the length of the ascending colon (axial CT images 38 through 54, CT series number 2). Subsequent dilatation of the ascending colon is seen (approximately 5.1 cm). The visualized loops of small bowel are normal in caliber. Vascular/Lymphatic: Aortic atherosclerosis. No enlarged abdominal or pelvic lymph nodes. Reproductive: Small heterogeneous uterine fibroids are seen within the body of the uterus on the right. The bilateral adnexa are unremarkable. Other: No abdominal wall hernia or  abnormality. No abdominopelvic ascites. Musculoskeletal: A compression fracture deformity is seen at the level of T11 with marked severity interval decrease in vertebral body height since the prior study. An expansile lytic lesion and associated pathological fracture is seen involving the anterolateral aspect of the fifth right rib (axial CT image 1, CT series 2). Numerous lytic lesions of various sizes are seen scattered throughout the thoracolumbar spine and pelvis. IMPRESSION: 1. Intussusception involving the cecum and distal ileum within the length of the ascending colon, with subsequent bowel obstruction. 2. Numerous lytic lesions of various sizes scattered throughout the thoracolumbar spine and pelvis, consistent with osseous metastatic disease. 3.  Pathologic fracture deformity at the level of T11 with marked severity interval decrease in vertebral body height since the prior study. 4. Stable cystic lesion within the tail of the pancreas. 5. Small heterogeneous uterine fibroids. 6. Aortic atherosclerosis. Aortic Atherosclerosis (ICD10-I70.0). Electronically Signed   By: Virgina Norfolk M.D.   On: 06/28/2021 19:41   DG Chest Port 1 View  Result Date: 06/29/2021 CLINICAL DATA:  Central line placement. EXAM: PORTABLE CHEST 1 VIEW COMPARISON:  05/14/2021 FINDINGS: 1212 hours. Right IJ central line tip overlies the distal SVC level near the expected junction with the RA. No evidence for pneumothorax. Right pulmonary lesion again noted. Left lung clear. Telemetry leads overlie the chest. IMPRESSION: Right IJ central line tip overlies the distal SVC level. No pneumothorax. Electronically Signed   By: Misty Stanley M.D.   On: 06/29/2021 12:38    Scheduled Meds:  sodium chloride   Intravenous Once   [MAR Hold] acetaminophen  1,000 mg Oral Once   [MAR Hold] dicyclomine  10 mg Oral Once   HYDROmorphone       [MAR Hold] pantoprazole (PROTONIX) IV  40 mg Intravenous Q24H   Continuous Infusions:  [MAR Hold] sodium chloride     0.9 % NaCl with KCl 20 mEq / L     acetaminophen     [MAR Hold] piperacillin-tazobactam (ZOSYN)  IV 3.375 g (06/29/21 0630)     LOS: 1 day   Time spent: 45 minutes   Tamira Ryland Loann Quill, MD [facility]  If 7PM-7AM, please contact night-coverage [email or web address] 06/29/2021, 1:26 PM

## 2021-06-29 NOTE — Progress Notes (Signed)
Intussusception of cecum ([place])  Subjective: Pt feeling about the same, no changes overnight  Objective: Vital signs in last 24 hours: Temp:  [97.9 F (36.6 C)] 97.9 F (36.6 C) (02/17 1640) Pulse Rate:  [82-122] 85 (02/18 0800) Resp:  [14-29] 29 (02/18 0800) BP: (95-164)/(53-77) 154/61 (02/18 0800) SpO2:  [96 %-100 %] 96 % (02/18 0800) Weight:  [50.2 kg] 50.2 kg (02/17 1503)    Intake/Output from previous day: 02/17 0701 - 02/18 0700 In: 1966.9 [I.V.:966.9; IV Piggyback:1000] Out: -  Intake/Output this shift: No intake/output data recorded.  General appearance: alert and cooperative GI: soft, TTP RUQ  Lab Results:  Results for orders placed or performed during the hospital encounter of 06/28/21 (from the past 24 hour(s))  CBC with Differential     Status: Abnormal   Collection Time: 06/28/21  4:44 PM  Result Value Ref Range   WBC 21.4 (H) 4.0 - 10.5 K/uL   RBC 3.74 (L) 3.87 - 5.11 MIL/uL   Hemoglobin 9.3 (L) 12.0 - 15.0 g/dL   HCT 30.8 (L) 36.0 - 46.0 %   MCV 82.4 80.0 - 100.0 fL   MCH 24.9 (L) 26.0 - 34.0 pg   MCHC 30.2 30.0 - 36.0 g/dL   RDW 26.5 (H) 11.5 - 15.5 %   Platelets 383 150 - 400 K/uL   nRBC 0.0 0.0 - 0.2 %   Neutrophils Relative % 93 %   Neutro Abs 19.6 (H) 1.7 - 7.7 K/uL   Lymphocytes Relative 3 %   Lymphs Abs 0.7 0.7 - 4.0 K/uL   Monocytes Relative 3 %   Monocytes Absolute 0.7 0.1 - 1.0 K/uL   Eosinophils Relative 0 %   Eosinophils Absolute 0.0 0.0 - 0.5 K/uL   Basophils Relative 0 %   Basophils Absolute 0.1 0.0 - 0.1 K/uL   Immature Granulocytes 1 %   Abs Immature Granulocytes 0.30 (H) 0.00 - 0.07 K/uL   Acanthocytes PRESENT    Polychromasia PRESENT    Ovalocytes PRESENT   Comprehensive metabolic panel     Status: Abnormal   Collection Time: 06/28/21  4:44 PM  Result Value Ref Range   Sodium 130 (L) 135 - 145 mmol/L   Potassium 3.8 3.5 - 5.1 mmol/L   Chloride 95 (L) 98 - 111 mmol/L   CO2 23 22 - 32 mmol/L   Glucose, Bld 109 (H) 70 - 99  mg/dL   BUN 8 8 - 23 mg/dL   Creatinine, Ser 0.69 0.44 - 1.00 mg/dL   Calcium 8.4 (L) 8.9 - 10.3 mg/dL   Total Protein 6.1 (L) 6.5 - 8.1 g/dL   Albumin 2.5 (L) 3.5 - 5.0 g/dL   AST 24 15 - 41 U/L   ALT 8 0 - 44 U/L   Alkaline Phosphatase 165 (H) 38 - 126 U/L   Total Bilirubin 1.9 (H) 0.3 - 1.2 mg/dL   GFR, Estimated >60 >60 mL/min   Anion gap 12 5 - 15  Lipase, blood     Status: None   Collection Time: 06/28/21  4:44 PM  Result Value Ref Range   Lipase 27 11 - 51 U/L  Urinalysis, Routine w reflex microscopic Urine, Clean Catch     Status: Abnormal   Collection Time: 06/28/21  4:44 PM  Result Value Ref Range   Color, Urine YELLOW YELLOW   APPearance HAZY (A) CLEAR   Specific Gravity, Urine 1.005 1.005 - 1.030   pH 7.0 5.0 - 8.0   Glucose, UA NEGATIVE  NEGATIVE mg/dL   Hgb urine dipstick NEGATIVE NEGATIVE   Bilirubin Urine NEGATIVE NEGATIVE   Ketones, ur 20 (A) NEGATIVE mg/dL   Protein, ur NEGATIVE NEGATIVE mg/dL   Nitrite NEGATIVE NEGATIVE   Leukocytes,Ua SMALL (A) NEGATIVE   RBC / HPF 0-5 0 - 5 RBC/hpf   WBC, UA 11-20 0 - 5 WBC/hpf   Bacteria, UA RARE (A) NONE SEEN   Squamous Epithelial / LPF 6-10 0 - 5   Mucus PRESENT   Iron and TIBC     Status: Abnormal   Collection Time: 06/28/21  4:58 PM  Result Value Ref Range   Iron 47 28 - 170 ug/dL   TIBC 132 (L) 250 - 450 ug/dL   Saturation Ratios 36 (H) 10.4 - 31.8 %   UIBC 85 ug/dL  I-stat chem 8, ED (not at [facility] or [facility])     Status: Abnormal   Collection Time: 06/28/21  5:58 PM  Result Value Ref Range   Sodium 132 (L) 135 - 145 mmol/L   Potassium 3.4 (L) 3.5 - 5.1 mmol/L   Chloride 94 (L) 98 - 111 mmol/L   BUN 8 8 - 23 mg/dL   Creatinine, Ser 0.50 0.44 - 1.00 mg/dL   Glucose, Bld 107 (H) 70 - 99 mg/dL   Calcium, Ion 1.11 (L) 1.15 - 1.40 mmol/L   TCO2 28 22 - 32 mmol/L   Hemoglobin 10.9 (L) 12.0 - 15.0 g/dL   HCT 32.0 (L) 36.0 - 46.0 %  Resp Panel by RT-PCR (Flu A&B, Covid) Nasopharyngeal Swab     Status: None    Collection Time: 06/28/21  8:00 PM   Specimen: Nasopharyngeal Swab; Nasopharyngeal(NP) swabs in vial transport medium  Result Value Ref Range   SARS Coronavirus 2 by RT PCR NEGATIVE NEGATIVE   Influenza A by PCR NEGATIVE NEGATIVE   Influenza B by PCR NEGATIVE NEGATIVE  Lactic acid, plasma     Status: None   Collection Time: 06/28/21  8:28 PM  Result Value Ref Range   Lactic Acid, Venous 1.2 0.5 - 1.9 mmol/L  Lactic acid, plasma     Status: None   Collection Time: 06/29/21  6:23 AM  Result Value Ref Range   Lactic Acid, Venous 0.8 0.5 - 1.9 mmol/L  CBC with Differential/Platelet     Status: Abnormal   Collection Time: 06/29/21  6:23 AM  Result Value Ref Range   WBC 18.6 (H) 4.0 - 10.5 K/uL   RBC 3.22 (L) 3.87 - 5.11 MIL/uL   Hemoglobin 8.1 (L) 12.0 - 15.0 g/dL   HCT 27.0 (L) 36.0 - 46.0 %   MCV 83.9 80.0 - 100.0 fL   MCH 25.2 (L) 26.0 - 34.0 pg   MCHC 30.0 30.0 - 36.0 g/dL   RDW 25.7 (H) 11.5 - 15.5 %   Platelets 333 150 - 400 K/uL   nRBC 0.0 0.0 - 0.2 %   Neutrophils Relative % 90 %   Neutro Abs 16.9 (H) 1.7 - 7.7 K/uL   Lymphocytes Relative 4 %   Lymphs Abs 0.7 0.7 - 4.0 K/uL   Monocytes Relative 4 %   Monocytes Absolute 0.8 0.1 - 1.0 K/uL   Eosinophils Relative 1 %   Eosinophils Absolute 0.1 0.0 - 0.5 K/uL   Basophils Relative 0 %   Basophils Absolute 0.0 0.0 - 0.1 K/uL   Immature Granulocytes 1 %   Abs Immature Granulocytes 0.24 (H) 0.00 - 0.07 K/uL   Acanthocytes PRESENT  Schistocytes PRESENT    Polychromasia PRESENT    Ovalocytes PRESENT   Comprehensive metabolic panel     Status: Abnormal   Collection Time: 06/29/21  6:23 AM  Result Value Ref Range   Sodium 133 (L) 135 - 145 mmol/L   Potassium 3.2 (L) 3.5 - 5.1 mmol/L   Chloride 100 98 - 111 mmol/L   CO2 23 22 - 32 mmol/L   Glucose, Bld 95 70 - 99 mg/dL   BUN 6 (L) 8 - 23 mg/dL   Creatinine, Ser 0.51 0.44 - 1.00 mg/dL   Calcium 7.8 (L) 8.9 - 10.3 mg/dL   Total Protein 5.3 (L) 6.5 - 8.1 g/dL   Albumin 2.1  (L) 3.5 - 5.0 g/dL   AST 17 15 - 41 U/L   ALT 9 0 - 44 U/L   Alkaline Phosphatase 132 (H) 38 - 126 U/L   Total Bilirubin 0.7 0.3 - 1.2 mg/dL   GFR, Estimated >60 >60 mL/min   Anion gap 10 5 - 15  Magnesium     Status: None   Collection Time: 06/29/21  6:23 AM  Result Value Ref Range   Magnesium 1.8 1.7 - 2.4 mg/dL     Studies/Results Radiology     MEDS, Scheduled  acetaminophen  1,000 mg Oral Once   dicyclomine  10 mg Oral Once   pantoprazole (PROTONIX) IV  40 mg Intravenous Q24H     Assessment: Intussusception of cecum (HCC) Severe Malnutrition Metastatic breast cancer  Plan: We have discussed surgery in detail again today with the aid of a [place] interpreter.  Pt understand that she will have an ileostomy after surgery and will be at high risk for dehydration.  She will need to increase her food and water intake to compensate for this.  Ileostomy could be reversed in 3-6 mo if pt's nutrition improves.  Pt is aware she would need to be off chemo for surgery, which could advance her disease.  All questions answered.  Pt agrees to proceed.  This was discussed with her son as well.  The surgery and anatomy were described to the patient as well as the risks of surgery and the possible complications.  These include: Bleeding, deep abdominal infections and possible wound complications such as hernia and infection, damage to adjacent structures, leak of surgical connections, which can lead to other surgeries and possibly an ostomy, possible need for other procedures, such as abscess drains in radiology, possible prolonged hospital stay, possible diarrhea from removal of part of the colon, possible constipation from narcotics, prolonged fatigue/weakness or appetite loss, possible early recurrence of of disease, possible complications of their medical problems such as heart disease or arrhythmias or lung problems, death (less than 1%). I believe the patient understands and wishes to  proceed with the surgery.      LOS: 1 day    Rosario Adie, MD [facility]  Patient's medical decision making was complex (50 mins met or exceeded with patient care and documentation).   06/29/2021 8:14 AM

## 2021-06-29 NOTE — Anesthesia Preprocedure Evaluation (Addendum)
Anesthesia Evaluation  Patient identified by MRN, date of birth, ID band Patient awake    Reviewed: Allergy & Precautions, NPO status , Patient's Chart, lab work & pertinent test results, reviewed documented beta blocker date and time   History of Anesthesia Complications Negative for: history of anesthetic complications  Airway Mallampati: II  TM Distance: >3 FB Neck ROM: Full    Dental  (+) Missing, Dental Advisory Given   Pulmonary COPD,  06/28/2021 SARS coronavirus NEG   breath sounds clear to auscultation       Cardiovascular hypertension, Pt. on medications and Pt. on home beta blockers (-) angina Rhythm:Regular Rate:Normal  07/28/2020 ECHO: Left ventricle: The cavity size was normal. Wall thickness wasnormal. Systolic function was normal. EF 55% to 60%. Wall motion was normal; no regional wall motion abnormalities. grade 1 diastolic dysfunction.  - Aortic valve: There was trivial regurgitation   Neuro/Psych  Headaches,    GI/Hepatic Neg liver ROS, GERD  Medicated and Controlled,Nausea with abd pain: Dx intussusception H/o GI bleed H/o mets to colon   Endo/Other  negative endocrine ROS  Renal/GU negative Renal ROS     Musculoskeletal   Abdominal   Peds  Hematology  (+) Blood dyscrasia (Hb 8.1), anemia ,   Anesthesia Other Findings Breast cancer  Reproductive/Obstetrics                            Anesthesia Physical Anesthesia Plan  ASA: 3 and emergent  Anesthesia Plan: General   Post-op Pain Management: Ofirmev IV (intra-op)*   Induction: Intravenous and Rapid sequence  PONV Risk Score and Plan: 3 and Ondansetron, Dexamethasone and Treatment may vary due to age or medical condition  Airway Management Planned: Oral ETT  Additional Equipment: None, CVP and Ultrasound Guidance Line Placement  Intra-op Plan:   Post-operative Plan: Possible Post-op  intubation/ventilation  Informed Consent: I have reviewed the patients History and Physical, chart, labs and discussed the procedure including the risks, benefits and alternatives for the proposed anesthesia with the patient or authorized representative who has indicated his/her understanding and acceptance.     Dental advisory given and Interpreter used for interveiw  Plan Discussed with: CRNA and Surgeon  Anesthesia Plan Comments:        Anesthesia Quick Evaluation

## 2021-06-29 NOTE — Transfer of Care (Signed)
Immediate Anesthesia Transfer of Care Note  Patient: Robin Estrada  Procedure(s) Performed: LAPAROSCOPIC PARTIAL RIGHT COLECTOMY WITH END ILEOSTOMY (Abdomen)  Patient Location: PACU  Anesthesia Type:General  Level of Consciousness: sedated  Airway & Oxygen Therapy: Patient Spontanous Breathing and Patient connected to face mask oxygen  Post-op Assessment: Report given to RN and Post -op Vital signs reviewed and stable  Post vital signs: Reviewed and stable  Last Vitals:  Vitals Value Taken Time  BP 134/78 06/29/21 1205  Temp    Pulse 76 06/29/21 1206  Resp 26 06/29/21 1206  SpO2 99 % 06/29/21 1206  Vitals shown include unvalidated device data.  Last Pain:  Vitals:   06/29/21 0058  TempSrc:   PainSc: Asleep         Complications: No notable events documented.

## 2021-06-29 NOTE — Op Note (Signed)
06/29/2021  11:45 AM  PATIENT:  Robin Estrada  76 y.o. female  Patient Care Team: Plotnikov, Evie Lacks, MD as PCP - General (Internal Medicine) Magrinat, Virgie Dad, MD (Inactive) as Consulting Physician (Oncology) Fanny Skates, MD as Consulting Physician (General Surgery) Kyung Rudd, MD as Consulting Physician (Radiation Oncology)  PRE-OPERATIVE DIAGNOSIS:  INTUSSUSCEPTION  POST-OPERATIVE DIAGNOSIS:  INTUSSUSCEPTION  PROCEDURE:  LAPAROSCOPIC RIGHT COLECTOMY WITH END ILEOSTOMY    Surgeon(s): Leighton Ruff, MD Kinsinger, Arta Bruce, MD  ASSISTANT: Dr Kieth Brightly   ANESTHESIA:   local and general  EBL:30 ml  Total I/O In: 1100 [I.V.:1000; IV Piggyback:100] Out: 550 [Urine:500; Blood:50]  SPECIMEN:  Source of Specimen:  R colon  DISPOSITION OF SPECIMEN:  PATHOLOGY  COUNTS:  YES  PLAN OF CARE: Admit to inpatient   PATIENT DISPOSITION:  PACU - hemodynamically stable.   INDICATIONS: This is a 76 y.o. female who presented to the ED with 2 month h/o LOA and abdominal pain.  CT showed intussusception.  I recommended colectomy.  Because of her malnutrition, I recommended end ileostomy instead of anastomosis. The risk and benefits and alternative treatments were explained to the patient prior to the OR and the patient has elected to proceed with lap R colectomy and end ileostomy.  Consent was signed and placed on chart prior to the OR.   OR FINDINGS: Ileocecal intussusception   DESCRIPTION:  The patient was identified & brought into the operating room. The patient was positioned supine with arms tucked. SCDs were active during the entire case. The patient underwent general anesthesia without any difficulty. A foley catheter was inserted under sterile conditions. The abdomen was prepped and draped in a sterile fashion. A Surgical Timeout confirmed our plan.  I made an incision around the umbilical fold. I dissected down through the subcutaneous tissues using cautery.  The fascia  was divided with cautery.  Blunt dissection was used to obtain peritoneal entry.  An alexis wound protector was placed and the cap was placed over this.  The abdomen was insufflated to ~15 mmHg.  Camera inspection revealed no injury.  I placed additional ports under direct laparoscopic visualization.  I evaluated the entire abdomen laparoscopically.  The liver appeared normal, the large and small bowel were normal as well.  There were no signs of obvious metastatic disease.   I began by freeing the appendix off its attachments to the pelvic wall. I mobilized the terminal ileum.  I took care to avoid injuring any retroperitoneal structures.  After this I began to mobilize laterally down the white line of Toldt and then took down the hepatic flexure using the Enseal device. I mobilized the omentum off of the right transverse colon. The entire colon was then flipped medially and mobilized off of the retroperitoneal structures until I could visualize the lateral edge of the duodenum underneath.  I gently freed the duodenal attachments.  At that point, I desufflated the abdomen and removed the wound protector cap.  The terminal ileum and right colon were then removed from the wound. The terminal ileum was transected using a GIA blue load stapler. The remaining mesentery was divided using the Enseal device.  I divided the ileocolic artery and vein also using the Enseal.  I identified a portion of the transverse colon just distal to the hepatic flexure. Hemostasis was good at the staple line. Several 3-0 silk sutures were used to imbricate the edge of the colon.  A marking prolene was attached to the staple line.  This  was then placed back into the abdomen. The abdomen was then irrigated with normal saline. The omentum was then brought down over the anastomosis. The Alexis wound protector was removed, and we switched to clean gloves.The fascia was then closed using running #1 Novafil sutures.  The subcutaneous tissue of  the extraction incision was closed using interrupted 2-0 Vicryl sutures. The skin was then closed using staples. The ileostomy was then matured in standard Brooke fashion using 2-0 Vicryl sutures. All counts were correct per operating room staff. The patient was then awakened from anesthesia and sent to the post anesthesia care unit in stable condition.

## 2021-06-29 NOTE — Progress Notes (Signed)
[facility] notified of critical Troponin 14, 681.

## 2021-06-29 NOTE — Progress Notes (Signed)
Called emergently to PACU for VT and patient unresponsive.  Arrived 12:56 to find pt unresponsive and in Vfib. Chest compressions and BVM O2 started immed, pt defib at 12:59, with sinus brady resulting, spont vent resumed, 590mcg epi given and pt with pulse and responsive at 13:00:30.  CXR: no PTX 12 lead EKG: elevated ST inferiorly    Alerting [facility] and [facility], as well as surgical team, STEMI activated VSS, NTG infusion begun Hb 9.2, K+ 3.1, Ca 1.08  Jenita Seashore, MD

## 2021-06-29 NOTE — Progress Notes (Addendum)
PROGRESS NOTE    Robin Estrada  OEH:[id number] DOB: Apr 11, 1946 DOA: 06/28/2021 PCP: [name], MD   Brief Narrative:  76 year old [name] with a history of lung cancer, history of breast cancer, new vertebral masses presents the ER today with a several day history of worsening abdominal pain, nausea. Labs showed a white count of 21.4, hemoglobin of 9.3, platelets of 383. Chemistry sodium 130, BUN of 8, creatinine 0.79. UA is essentially negative.   CT abdomen with IV contrast showed large intussusception of the cecum and distal ileum within the length of the ascending colon and subsequent dilatation of the ascending colon. There are also noted various and numerous lytic lesions throughout the thoracolumbar spine and pelvis consistent with metastatic disease. General surgery was consulted.  Patient started on broad-spectrum antibiotics and [facility] hospitalist consulted for admission for further evaluation and management.    Assessment & Plan:  Cecal and distal ileum intussusception with bowel obstruction: -Patient presented with severe abdominal pain -She is afebrile with leukocytosis of 21.4.  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   -  Patient underwent CT-guided lung biopsy on 05/14/2021-results consistent of poorly differentiated malignancy with sarcomatoid and epithelioid features.   -Continue as needed pain medications  Hypokalemia: Replenished.  Magnesium level: WNL -Repeat BMP tomorrow a.m.  History of GI bleed: -Resume Protonix after  the surgery.  Avoid NSAIDs monitor H&H closely  Hypertension: Stable -Resume atenolol after the surgery  Anemia of chronic disease: -H&H is stable.  Monitor H&H closely after the surgery.  Transfuse as needed  Aortic atherosclerosis: Noted on recent CT  Cystic mass of pancreas: -Previous CA 19-9 level normal. -Follow-up outpatient  Patient is at increased risk of mortality and morbidity due to history of metastatic breast cancer.   Addendum: Received call from the PACU that patient coded.  I called on call PCCM Dr. Melvyn Novas and discussed about the patient.  Patient will be transferred to ICU for close monitoring and further management.  I called patient's son and updated him regarding the acute situation. I also discussed CODE status, he said he will discuss with the patient and will update [place].  Addendum: Patient's EKG concerning for STEMI. I called cardiology on-call Dr. Gwenlyn Found and discussed EKG results.  Patient is at high risk for cardiac cath at this time due to recent surgery. I had long discussion with patient's son and discussed risks of the s cardiac cath  including but not limited to death.  Son agreed with comfort care and continue current medical management.  He agreed to keep patient here at [facility]. I will consult palliative care to discuss further goals of care with patient and her family.  DVT prophylaxis: SCD Code Status: Full code Family Communication:  None present at bedside.  Plan of care discussed with patient in length and she verbalized understanding and agreed with it. Disposition Plan: To be determined  Consultants:  General surgery PCCM  Procedures:  Laparoscopic c left right colectomy with end ileostomy  Antimicrobials:  Zosyn  Status is: Inpatient     Subjective: Patient seen and examined.  [place] interpreter  used for the history.  General surgery at the bedside.  Discussed about risk and benefits of the surgery.  Patient continues to have  abdominal pain however she denies nausea or vomiting or  fever.  Willing to proceed with surgery.  Objective: Vitals:   06/29/21 1213 06/29/21 1215 06/29/21 1219 06/29/21 1230  BP:  (!) 124/106 127/60 125/62  Pulse: 76 79 74 75  Resp: (!) 24 12 15 15   Temp:    98.1 F (36.7 C)  TempSrc:      SpO2: 97% 99% 100% 99%    Intake/Output Summary (Last 24 hours) at 06/29/2021 1246 Last data filed at 06/29/2021 1154 Gross per 24 hour  Intake 3766.89 ml  Output 550 ml  Net 3216.89 ml   There were no vitals filed for this visit.  Examination:  General exam: Appears calm and comfortable, on room air Respiratory system: Clear to auscultation. Respiratory effort normal. Cardiovascular system: S1 & S2 heard, RRR. No JVD, murmurs, rubs, gallops or clicks. No pedal edema. Gastrointestinal system: Abdomen is soft, right upper abdominal tenderness positive, no guarding, no rigidity.   Central nervous system: Alert and oriented. No focal neurological deficits. Skin: No rashes, lesions or ulcers Psychiatry: Judgement and insight appear normal. Mood & affect appropriate.    Data Reviewed: I have personally reviewed following labs and imaging studies  CBC: Recent Labs  Lab 06/28/21 1644 06/28/21 1758 06/29/21 0623 06/29/21 1016  WBC 21.4*  --  18.6*  --   NEUTROABS 19.6*  --  16.9*  --   HGB 9.3* 10.9* 8.1* 8.8*  HCT 30.8* 32.0* 27.0* 26.0*  MCV 82.4  --  83.9  --   PLT 383  --  333  --    Basic Metabolic Panel: Recent Labs  Lab 06/28/21 1644 06/28/21 1758 06/29/21 0623 06/29/21 1016  NA 130* 132* 133* 134*  K 3.8 3.4* 3.2* 2.7*  CL 95* 94* 100 99  CO2 23  --  23  --   GLUCOSE 109* 107* 95 92  BUN 8 8 6* <3*  CREATININE 0.69 0.50 0.51 0.30*  CALCIUM 8.4*  --  7.8*  --   MG  --   --  1.8  --    GFR: Estimated Creatinine Clearance: 43 mL/min (A) (by C-G formula based on SCr of 0.3 mg/dL (L)). Liver Function Tests: Recent Labs  Lab 06/28/21 1644 06/29/21 0623  AST 24 17   ALT 8 9  ALKPHOS 165* 132*  BILITOT 1.9* 0.7  PROT 6.1* 5.3*  ALBUMIN 2.5* 2.1*   Recent Labs  Lab 06/28/21 1644  LIPASE 27   No results for input(s): AMMONIA in the last 168 hours. Coagulation Profile: No results for input(s): INR, PROTIME in the last 168 hours. Cardiac Enzymes: No results for input(s): CKTOTAL, CKMB, CKMBINDEX, TROPONINI in the last 168 hours. BNP (last 3 results) No results for input(s): PROBNP in the last 8760 hours. HbA1C: No results for input(s): HGBA1C in the last 72 hours. CBG: No results for input(s): GLUCAP in the last 168 hours. Lipid Profile: No results for input(s): CHOL, HDL, LDLCALC, TRIG, CHOLHDL, LDLDIRECT in the last 72 hours. Thyroid Function Tests: No results for input(s): TSH, T4TOTAL, FREET4, T3FREE, THYROIDAB in the last 72 hours. Anemia Panel: Recent Labs    06/28/21 1658  TIBC 132*  IRON 47   Sepsis Labs: Recent Labs  Lab 06/28/21 2028 06/29/21 0623  LATICACIDVEN 1.2 0.8    Recent Results (from the past 240 hour(s))  Resp Panel by RT-PCR (Flu A&B, Covid) Nasopharyngeal Swab     Status: None   Collection Time: 06/28/21  8:00 PM   Specimen: Nasopharyngeal Swab; Nasopharyngeal(NP) swabs in vial transport medium  Result Value Ref Range Status   SARS Coronavirus 2 by RT PCR NEGATIVE NEGATIVE Final    Comment: (NOTE) SARS-CoV-2 target nucleic acids are NOT DETECTED.  The SARS-CoV-2 RNA is generally detectable in upper respiratory specimens during the acute phase of infection. The lowest concentration of SARS-CoV-2 viral copies this assay can detect is 138 copies/mL. A negative result does not preclude SARS-Cov-2 infection and should not be used as the sole basis for treatment or other patient management decisions. A negative result may occur with  improper specimen collection/handling, submission of specimen other than nasopharyngeal swab, presence of viral mutation(s) within the areas targeted by this assay, and  inadequate number of viral copies(<138 copies/mL). A negative result must be combined with clinical observations, patient history, and epidemiological information. The expected result is Negative.  Fact Sheet for Patients:  EntrepreneurPulse.com.au  Fact Sheet for Healthcare Providers:  IncredibleEmployment.be  This test is no t yet approved or cleared by the [place] FDA and  has been authorized for detection and/or diagnosis of SARS-CoV-2 by FDA under an Emergency Use Authorization (EUA). This EUA will remain  in effect (meaning this test can be used) for the duration of the COVID-19 declaration under Section 564(b)(1) of the Act, 21 U.S.C.section 360bbb-3(b)(1), unless the authorization is terminated  or revoked sooner.       Influenza A by PCR NEGATIVE NEGATIVE Final   Influenza B by PCR NEGATIVE NEGATIVE Final    Comment: (NOTE) The Xpert Xpress SARS-CoV-2/FLU/RSV plus assay is intended as an aid in the diagnosis of influenza from Nasopharyngeal swab specimens and should not be used as a sole basis for treatment. Nasal washings and aspirates are unacceptable for Xpert Xpress SARS-CoV-2/FLU/RSV testing.  Fact Sheet for Patients: EntrepreneurPulse.com.au  Fact Sheet for Healthcare Providers: IncredibleEmployment.be  This test is not yet approved or cleared by the [place] FDA and has been authorized for detection and/or diagnosis of SARS-CoV-2 by FDA under an Emergency Use Authorization (EUA). This EUA will remain in effect (meaning this test can be used) for the duration of the COVID-19 declaration under Section 564(b)(1) of the Act, 21 U.S.C. section 360bbb-3(b)(1), unless the authorization is terminated or revoked.  Performed at [facility], [place] [address] [place], [place] [address]       Radiology Studies: CT ABDOMEN PELVIS W CONTRAST  Result Date:  06/28/2021 CLINICAL DATA:  Abdominal pain and nausea x5 days. EXAM: CT ABDOMEN AND PELVIS WITH CONTRAST TECHNIQUE: Multidetector CT imaging of the abdomen and pelvis was performed using the standard protocol following bolus administration of intravenous contrast. RADIATION DOSE REDUCTION: This exam was performed according to the departmental dose-optimization program which includes automated exposure control, adjustment of the mA and/or kV according to patient size and/or use of iterative reconstruction technique. CONTRAST:  162mL OMNIPAQUE IOHEXOL 300 MG/ML  SOLN COMPARISON:  May 03, 2021 FINDINGS: Lower chest: No acute abnormality. Hepatobiliary: No focal liver abnormality is seen. No gallstones, gallbladder wall thickening, or biliary dilatation. Pancreas: A stable 2.5 cm x 1.8 cm cystic appearing structure is seen within the tail of the pancreas (axial CT image 27, CT series 2). Spleen: Normal in size without focal abnormality. Adrenals/Urinary Tract: Adrenal glands are unremarkable. Kidneys are normal, without renal calculi or hydronephrosis. Multiple subcentimeter simple cysts are  seen within both kidneys. The largest measures approximately 12 mm in diameter. Bladder is unremarkable. Stomach/Bowel: Stomach is within normal limits. The appendix is poorly visualized. There is intussusception of the cecum and distal ileum within the length of the ascending colon (axial CT images 38 through 54, CT series number 2). Subsequent dilatation of the ascending colon is seen (approximately 5.1 cm). The visualized loops of small bowel are normal in caliber. Vascular/Lymphatic: Aortic atherosclerosis. No enlarged abdominal or pelvic lymph nodes. Reproductive: Small heterogeneous uterine fibroids are seen within the body of the uterus on the right. The bilateral adnexa are unremarkable. Other: No abdominal wall hernia or abnormality. No abdominopelvic ascites. Musculoskeletal: A compression fracture deformity is seen at  the level of T11 with marked severity interval decrease in vertebral body height since the prior study. An expansile lytic lesion and associated pathological fracture is seen involving the anterolateral aspect of the fifth right rib (axial CT image 1, CT series 2). Numerous lytic lesions of various sizes are seen scattered throughout the thoracolumbar spine and pelvis. IMPRESSION: 1. Intussusception involving the cecum and distal ileum within the length of the ascending colon, with subsequent bowel obstruction. 2. Numerous lytic lesions of various sizes scattered throughout the thoracolumbar spine and pelvis, consistent with osseous metastatic disease. 3. Pathologic fracture deformity at the level of T11 with marked severity interval decrease in vertebral body height since the prior study. 4. Stable cystic lesion within the tail of the pancreas. 5. Small heterogeneous uterine fibroids. 6. Aortic atherosclerosis. Aortic Atherosclerosis (ICD10-I70.0). Electronically Signed   By: Virgina Norfolk M.D.   On: 06/28/2021 19:41   DG Chest Port 1 View  Result Date: 06/29/2021 CLINICAL DATA:  Central line placement. EXAM: PORTABLE CHEST 1 VIEW COMPARISON:  05/14/2021 FINDINGS: 1212 hours. Right IJ central line tip overlies the distal SVC level near the expected junction with the RA. No evidence for pneumothorax. Right pulmonary lesion again noted. Left lung clear. Telemetry leads overlie the chest. IMPRESSION: Right IJ central line tip overlies the distal SVC level. No pneumothorax. Electronically Signed   By: Misty Stanley M.D.   On: 06/29/2021 12:38    Scheduled Meds:  sodium chloride   Intravenous Once   [MAR Hold] acetaminophen  1,000 mg Oral Once   [MAR Hold] dicyclomine  10 mg Oral Once   HYDROmorphone       [MAR Hold] pantoprazole (PROTONIX) IV  40 mg Intravenous Q24H   Continuous Infusions:  sodium chloride 100 mL/hr at 06/29/21 0616   [MAR Hold] sodium chloride     acetaminophen     [MAR Hold]  piperacillin-tazobactam (ZOSYN)  IV 3.375 g (06/29/21 0630)     LOS: 1 day   Time spent: 45 minutes   Heavenleigh Petruzzi Loann Quill, MD [facility]  If 7PM-7AM, please contact night-coverage [email or web address] 06/29/2021, 12:46 PM

## 2021-06-29 NOTE — Progress Notes (Signed)
Anesthesiology: Discussed with Dr. Gwenlyn Found (cards), pt is not a candidate for anticoag and emergent cath intervention given pt is immediately post op. Pt will be managed by critical care here, with Cardiology consulting regarding heart care. Pt's son at bedside. With his translation, pt and son understand that she will be fully cared for here at [facility].  Pt VSS, remains on NTG infusion, no pain at all. Will transfer to ICU  Jenita Seashore, MD

## 2021-06-29 NOTE — Consult Note (Addendum)
Cardiology Consultation:   Patient ID: Robin Estrada MRN: [id number]; DOB: April 03, 1946  Admit date: 06/28/2021 Date of Consult: 06/29/2021  PCP:  [name], MD   [facility] Providers Cardiologist:  Johnsie Cancel }   Patient Profile:   Robin Estrada is a 76 y.o. female with a hx of metatsatic breast cancer  who is being seen 06/29/2021 for the evaluation of postop cardiac arrest at the request of Dr Doristine Bosworth.  History of Present Illness:   Robin Estrada 76 yo female history metastatic breast cancer with mets to spine and lung  and pathologic T11 fx, history of GI bleed, admitted with cecum/ileum intussception. She is s/p surgery 06/29/21 lap right colectomy. In PACU postop patient with cardiac arrest.   From anesthesia note and discussion patient did fine thoughout surgery. In PACU patient had VT arrest in PACU, unresponsive. Chest compressions and defibrillated x 1, received epi. ROSC in 90 seconds, did not get intuabted.   Post code EKG concerning for inferior STEMI. Anesthesia discussed case with Dr Gwenlyn Found on call interventinoal, not a cath candidate given metastatic cancer and colectomy. The PACU EKGs are missing, we are contacting PACU. Repeated EKG in ICU shows ongoing inferior ST elevations.     Currently patient is lying comfortable in bed. Denies any chest pain, no SOB.   WBC 18.6 Hgb 8.1 Plt 333 K 3.2 Cr 0.51 Mg 1.8   Past Medical History:  Diagnosis Date   Breast mass, left    Large   Cancer ([place])    FIBROMYOMA  AGE 57-   BENIGN  RESOLVED   GI bleed 05/03/2021   1/23 continue with pantoprazole   Headache    Heart murmur    Hypertension    Mitral valve prolapse    Tuberculosis    AS CHILD  W/ TX    Past Surgical History:  Procedure Laterality Date   BIOPSY  05/04/2021   Procedure: BIOPSY;  Surgeon: Irving Copas., MD;  Location: [place];  Service: Gastroenterology;;   BRONCHIAL BIOPSY  05/07/2021   Procedure: BRONCHIAL BIOPSIES;  Surgeon:  Margaretha Seeds, MD;  Location: [facility] ENDOSCOPY;  Service: Pulmonary;;   BRONCHIAL BRUSHINGS  05/07/2021   Procedure: BRONCHIAL BRUSHINGS;  Surgeon: Margaretha Seeds, MD;  Location: [facility] ENDOSCOPY;  Service: Pulmonary;;   BRONCHIAL NEEDLE ASPIRATION BIOPSY  05/07/2021   Procedure: BRONCHIAL NEEDLE ASPIRATION BIOPSIES;  Surgeon: Margaretha Seeds, MD;  Location: [facility] ENDOSCOPY;  Service: Pulmonary;;   BRONCHIAL WASHINGS  05/07/2021   Procedure: BRONCHIAL WASHINGS;  Surgeon: Margaretha Seeds, MD;  Location: [place];  Service: Pulmonary;;   COLONOSCOPY WITH PROPOFOL N/A 05/09/2021   Procedure: COLONOSCOPY WITH PROPOFOL;  Surgeon: Ladene Artist, MD;  Location: [facility] ENDOSCOPY;  Service: Endoscopy;  Laterality: N/A;   ESOPHAGOGASTRODUODENOSCOPY N/A 05/04/2021   Procedure: ESOPHAGOGASTRODUODENOSCOPY (EGD);  Surgeon: Irving Copas., MD;  Location: [place];  Service: Gastroenterology;  Laterality: N/A;   HEMOSTASIS CONTROL  05/07/2021   Procedure: HEMOSTASIS CONTROL;  Surgeon: Margaretha Seeds, MD;  Location: [facility] ENDOSCOPY;  Service: Pulmonary;;   MASS EXCISION Left 08/14/2017   Procedure: PARTIAL EXCISION  LEFT BREAST  MASS ERAS PATHWAY;  Surgeon: Fanny Skates, MD;  Location: [place];  Service: General;  Laterality: Left;   MASTECTOMY W/ SENTINEL NODE BIOPSY Left 09/14/2017   Procedure: LEFT TOTAL MASTECTOMY WITH SENTINEL LYMPH NODE BIOPSY;  Surgeon: Fanny Skates, MD;  Location: [place];  Service: General;  Laterality: Left;   NO PAST SURGERIES  POLYPECTOMY  05/09/2021   Procedure: POLYPECTOMY;  Surgeon: Ladene Artist, MD;  Location: [facility] ENDOSCOPY;  Service: Endoscopy;;   SUBMUCOSAL TATTOO INJECTION  05/09/2021   Procedure: SUBMUCOSAL TATTOO INJECTION;  Surgeon: Ladene Artist, MD;  Location: [facility] ENDOSCOPY;  Service: Endoscopy;;   VIDEO BRONCHOSCOPY WITH ENDOBRONCHIAL ULTRASOUND N/A 05/07/2021   Procedure: VIDEO BRONCHOSCOPY WITH ENDOBRONCHIAL ULTRASOUND;  Surgeon: Margaretha Seeds, MD;  Location: [place];  Service: Pulmonary;  Laterality: N/A;      Inpatient Medications: Scheduled Meds:  sodium chloride   Intravenous Once   acetaminophen  1,000 mg Oral Once   dicyclomine  10 mg Oral Once   HYDROmorphone       pantoprazole (PROTONIX) IV  40 mg Intravenous Q24H   Continuous Infusions:  sodium chloride     0.9 % NaCl with KCl 20 mEq / L     acetaminophen     nitroGLYCERIN     nitroGLYCERIN     piperacillin-tazobactam (ZOSYN)  IV 3.375 g (06/29/21 0630)   PRN Meds: dicyclomine, fentaNYL (SUBLIMAZE) injection, ondansetron (ZOFRAN) IV  Allergies:    Allergies  Allergen Reactions   Statins Nausea And Vomiting and Other (See Comments)    MUSCLE PAIN   Amlodipine     fatigue   Clonidine Derivatives     headache   Losartan     abd pain   Sulfa Antibiotics Nausea Only   Sulfamethoxazole-Trimethoprim Nausea Only    " Severe Nausea "    Social History:   Social History   Socioeconomic History   Marital status: Divorced    Spouse name: Not on file   Number of children: 2   Years of education: Not on file   Highest education level: Not on file  Occupational History   Not on file  Tobacco Use   Smoking status: Never   Smokeless tobacco: Never  Vaping Use   Vaping Use: Never used  Substance and Sexual Activity   Alcohol use: Yes    Alcohol/week: 0.0 standard drinks    Comment: OCC   Drug use: No   Sexual activity: Not on file  Other Topics Concern   Not on file  Social History Narrative   02-15-18 Unable to ask abuse questions son with her today.   Social Determinants of Health   Financial Resource Strain: Not on file  Food Insecurity: Not on file  Transportation Needs: Not on file  Physical Activity: Not on file  Stress: Not on file  Social Connections: Not on file  Intimate Partner Violence: Not on file    Family History:    Family History  Problem Relation Age of Onset   Stroke Mother    Stroke Maternal Grandmother     Breast cancer Sister      ROS:  Please see the history of present illness.  All other ROS reviewed and negative.     Physical Exam/Data:   Vitals:   06/29/21 1440 06/29/21 1445 06/29/21 1500 06/29/21 1515  BP: 128/65 131/74 (!) 154/85 (!) 144/85  Pulse: 97 90    Resp: 14 16 18 18   Temp:      TempSrc:      SpO2: 100% 95% 92% 92%    Intake/Output Summary (Last 24 hours) at 06/29/2021 1553 Last data filed at 06/29/2021 1500 Gross per 24 hour  Intake 3766.89 ml  Output 1150 ml  Net 2616.89 ml   Last 3 Weights 06/28/2021 06/10/2021 05/09/2021  Weight (lbs) 110 lb 9.6  oz 120 lb 12.8 oz 147 lb 14.9 oz  Weight (kg) 50.168 kg 54.795 kg 67.1 kg     There is no height or weight on file to calculate BMI.  General:  Well nourished, well developed, in no acute distress HEENT: normal Neck: no JVD Vascular: No carotid bruits; Distal pulses 2+ bilaterally Cardiac:  normal S1, S2; RRR; no murmur  Lungs:  clear to auscultation bilaterally, no wheezing, rhonchi or rales  Abd: soft, nontender, no hepatomegaly  Ext: no edema Musculoskeletal:  No deformities, BUE and BLE strength normal and equal Skin: warm and dry  Neuro:  CNs 2-12 intact, no focal abnormalities noted Psych:  Normal affect     Laboratory Data:  High Sensitivity Troponin:  No results for input(s): TROPONINIHS in the last 720 hours.   Chemistry Recent Labs  Lab 06/28/21 1644 06/28/21 1758 06/29/21 0623 06/29/21 1016 06/29/21 1340  NA 130* 132* 133* 134* 132*  K 3.8 3.4* 3.2* 2.7* 3.1*  CL 95* 94* 100 99  --   CO2 23  --  23  --   --   GLUCOSE 109* 107* 95 92  --   BUN 8 8 6* <3*  --   CREATININE 0.69 0.50 0.51 0.30*  --   CALCIUM 8.4*  --  7.8*  --   --   MG  --   --  1.8  --   --   GFRNONAA >60  --  >60  --   --   ANIONGAP 12  --  10  --   --     Recent Labs  Lab 06/28/21 1644 06/29/21 0623  PROT 6.1* 5.3*  ALBUMIN 2.5* 2.1*  AST 24 17  ALT 8 9  ALKPHOS 165* 132*  BILITOT 1.9* 0.7   Lipids No  results for input(s): CHOL, TRIG, HDL, LABVLDL, LDLCALC, CHOLHDL in the last 168 hours.  Hematology Recent Labs  Lab 06/28/21 1644 06/28/21 1758 06/29/21 0623 06/29/21 1016 06/29/21 1340  WBC 21.4*  --  18.6*  --   --   RBC 3.74*  --  3.22*  --   --   HGB 9.3*   < > 8.1* 8.8* 9.2*  HCT 30.8*   < > 27.0* 26.0* 27.0*  MCV 82.4  --  83.9  --   --   MCH 24.9*  --  25.2*  --   --   MCHC 30.2  --  30.0  --   --   RDW 26.5*  --  25.7*  --   --   PLT 383  --  333  --   --    < > = values in this interval not displayed.   Thyroid No results for input(s): TSH, FREET4 in the last 168 hours.  BNPNo results for input(s): BNP, PROBNP in the last 168 hours.  DDimer No results for input(s): DDIMER in the last 168 hours.   Radiology/Studies:  CT ABDOMEN PELVIS W CONTRAST  Result Date: 06/28/2021 CLINICAL DATA:  Abdominal pain and nausea x5 days. EXAM: CT ABDOMEN AND PELVIS WITH CONTRAST TECHNIQUE: Multidetector CT imaging of the abdomen and pelvis was performed using the standard protocol following bolus administration of intravenous contrast. RADIATION DOSE REDUCTION: This exam was performed according to the departmental dose-optimization program which includes automated exposure control, adjustment of the mA and/or kV according to patient size and/or use of iterative reconstruction technique. CONTRAST:  174mL OMNIPAQUE IOHEXOL 300 MG/ML  SOLN COMPARISON:  May 03, 2021 FINDINGS:  Lower chest: No acute abnormality. Hepatobiliary: No focal liver abnormality is seen. No gallstones, gallbladder wall thickening, or biliary dilatation. Pancreas: A stable 2.5 cm x 1.8 cm cystic appearing structure is seen within the tail of the pancreas (axial CT image 27, CT series 2). Spleen: Normal in size without focal abnormality. Adrenals/Urinary Tract: Adrenal glands are unremarkable. Kidneys are normal, without renal calculi or hydronephrosis. Multiple subcentimeter simple cysts are seen within both kidneys. The  largest measures approximately 12 mm in diameter. Bladder is unremarkable. Stomach/Bowel: Stomach is within normal limits. The appendix is poorly visualized. There is intussusception of the cecum and distal ileum within the length of the ascending colon (axial CT images 38 through 54, CT series number 2). Subsequent dilatation of the ascending colon is seen (approximately 5.1 cm). The visualized loops of small bowel are normal in caliber. Vascular/Lymphatic: Aortic atherosclerosis. No enlarged abdominal or pelvic lymph nodes. Reproductive: Small heterogeneous uterine fibroids are seen within the body of the uterus on the right. The bilateral adnexa are unremarkable. Other: No abdominal wall hernia or abnormality. No abdominopelvic ascites. Musculoskeletal: A compression fracture deformity is seen at the level of T11 with marked severity interval decrease in vertebral body height since the prior study. An expansile lytic lesion and associated pathological fracture is seen involving the anterolateral aspect of the fifth right rib (axial CT image 1, CT series 2). Numerous lytic lesions of various sizes are seen scattered throughout the thoracolumbar spine and pelvis. IMPRESSION: 1. Intussusception involving the cecum and distal ileum within the length of the ascending colon, with subsequent bowel obstruction. 2. Numerous lytic lesions of various sizes scattered throughout the thoracolumbar spine and pelvis, consistent with osseous metastatic disease. 3. Pathologic fracture deformity at the level of T11 with marked severity interval decrease in vertebral body height since the prior study. 4. Stable cystic lesion within the tail of the pancreas. 5. Small heterogeneous uterine fibroids. 6. Aortic atherosclerosis. Aortic Atherosclerosis (ICD10-I70.0). Electronically Signed   By: Virgina Norfolk M.D.   On: 06/28/2021 19:41   DG Chest Port 1 View  Result Date: 06/29/2021 CLINICAL DATA:  Status post code blue, concern  for pneumothorax EXAM: PORTABLE CHEST 1 VIEW COMPARISON:  06/29/2021 FINDINGS: Right neck vascular catheter. The heart size and mediastinal contours are within normal limits. Lobulated mass projects over the right midlung. No significant pneumothorax. No acute airspace opacity. Small volume pneumoperitoneum underlying the right hemidiaphragm. The visualized skeletal structures are unremarkable. IMPRESSION: 1. No significant pneumothorax or obvious displaced rib fracture in single AP portable view. 2. Small volume pneumoperitoneum underlying the right hemidiaphragm. These results will be called to the ordering clinician or representative by the Radiologist Assistant, and communication documented in the PACS or [employer]. Electronically Signed   By: Delanna Ahmadi M.D.   On: 06/29/2021 13:47   DG Chest Port 1 View  Result Date: 06/29/2021 CLINICAL DATA:  Central line placement. EXAM: PORTABLE CHEST 1 VIEW COMPARISON:  05/14/2021 FINDINGS: 1212 hours. Right IJ central line tip overlies the distal SVC level near the expected junction with the RA. No evidence for pneumothorax. Right pulmonary lesion again noted. Left lung clear. Telemetry leads overlie the chest. IMPRESSION: Right IJ central line tip overlies the distal SVC level. No pneumothorax. Electronically Signed   By: Misty Stanley M.D.   On: 06/29/2021 12:38     Assessment and Plan:   1.Postop cardiac arrest/STEMI - case had been discussed with interventional on call by primary team and anesthesia. Paitent with metastatic  cancer s/p colectomy today, not a candidate for catheterization - limited management options from cardiac standpoint.  - start ASA. Statin allergy, in this setting will see if can tolerate low dose pravatatin.  Avoid beta blocker/ACE/ARB until hemodynamics are further known. Hep gtt when ok from surgical standpoint. Continue NG gtt - obtain echo   - overall poor prognosis, agree with palliative care involvement.    Risk  Assessment/Risk Scores:     :[id number]}   TIMI Risk Score for ST  Elevation MI:   The patient's TIMI risk score is 4, which indicates a 7.3% risk of all cause mortality at 30 days.{   For questions or updates, please contact [place] Please consult [email or web address] for contact info under    Signed, Carlyle Dolly, MD  06/29/2021 3:53 PM

## 2021-06-29 NOTE — Progress Notes (Signed)
Dr. Melvyn Novas notified of the critical troponin level of 952. No orders received.

## 2021-06-29 NOTE — Anesthesia Procedure Notes (Signed)
Date/Time: 06/29/2021 11:54 AM Performed by: Cynda Familia, CRNA Oxygen Delivery Method: Simple face mask Placement Confirmation: positive ETCO2 and breath sounds checked- equal and bilateral Dental Injury: Teeth and Oropharynx as per pre-operative assessment

## 2021-06-29 NOTE — Anesthesia Procedure Notes (Signed)
Procedure Name: Intubation Date/Time: 06/29/2021 9:58 AM Performed by: Cynda Familia, CRNA Pre-anesthesia Checklist: Patient identified, Emergency Drugs available, Suction available and Patient being monitored Patient Re-evaluated:Patient Re-evaluated prior to induction Oxygen Delivery Method: Circle System Utilized Preoxygenation: Pre-oxygenation with 100% oxygen Induction Type: IV induction, Cricoid Pressure applied and Rapid sequence Ventilation: Mask ventilation without difficulty Laryngoscope Size: Miller and 2 Grade View: Grade II Tube type: Oral Number of attempts: 1 Airway Equipment and Method: Stylet Placement Confirmation: ETT inserted through vocal cords under direct vision, positive ETCO2 and breath sounds checked- equal and bilateral Secured at: 21 cm Tube secured with: Tape Dental Injury: Teeth and Oropharynx as per pre-operative assessment  Difficulty Due To: Difficult Airway- due to anterior larynx, Difficult Airway- due to limited oral opening and Difficult Airway- due to dentition Future Recommendations: Recommend- induction with short-acting agent, and alternative techniques readily available Comments: RSI- Jackson-- intubation AM CRNA- teeth and mouth as preop- atraumatic- intubation-- pt anterior consider Glidescope in future- bilat BS Glennon Mac

## 2021-06-29 NOTE — OR Nursing (Signed)
DR Doristine Bosworth with the medical team and Dr Kieth Brightly with the surgical team notified patient coded in PACU post op. Dr Doristine Bosworth placing orders for ICU bed and critical care consult.

## 2021-06-29 NOTE — Addendum Note (Signed)
Addendum  created 06/29/21 1541 by Annye Asa, MD   Clinical Note Signed

## 2021-06-30 ENCOUNTER — Inpatient Hospital Stay (HOSPITAL_COMMUNITY): Payer: Medicare HMO

## 2021-06-30 ENCOUNTER — Encounter (HOSPITAL_COMMUNITY): Payer: Self-pay | Admitting: General Surgery

## 2021-06-30 DIAGNOSIS — R079 Chest pain, unspecified: Secondary | ICD-10-CM | POA: Diagnosis not present

## 2021-06-30 DIAGNOSIS — R531 Weakness: Secondary | ICD-10-CM

## 2021-06-30 DIAGNOSIS — Z7189 Other specified counseling: Secondary | ICD-10-CM

## 2021-06-30 DIAGNOSIS — Z515 Encounter for palliative care: Secondary | ICD-10-CM

## 2021-06-30 DIAGNOSIS — K561 Intussusception: Secondary | ICD-10-CM | POA: Diagnosis not present

## 2021-06-30 DIAGNOSIS — R109 Unspecified abdominal pain: Secondary | ICD-10-CM | POA: Insufficient documentation

## 2021-06-30 LAB — BASIC METABOLIC PANEL
Anion gap: 11 (ref 5–15)
BUN: 6 mg/dL — ABNORMAL LOW (ref 8–23)
CO2: 22 mmol/L (ref 22–32)
Calcium: 7.7 mg/dL — ABNORMAL LOW (ref 8.9–10.3)
Chloride: 101 mmol/L (ref 98–111)
Creatinine, Ser: 0.52 mg/dL (ref 0.44–1.00)
GFR, Estimated: >60 mL/min (ref >60)
Glucose, Bld: 161 mg/dL — ABNORMAL HIGH (ref 70–99)
Potassium: 3.2 mmol/L — ABNORMAL LOW (ref 3.5–5.1)
Sodium: 134 mmol/L — ABNORMAL LOW (ref 135–145)

## 2021-06-30 LAB — ECHOCARDIOGRAM COMPLETE
AR max vel: 1.16 cm2
AV Area VTI: 1.54 cm2
AV Area mean vel: 1.19 cm2
AV Mean grad: 9.3 mmHg
AV Peak grad: 20 mmHg
Ao pk vel: 2.24 m/s
Area-P 1/2: 1.63 cm2
Calc EF: 42.3 %
Height: 60 in
P 1/2 time: 600 msec
S' Lateral: 3.4 cm
Single Plane A2C EF: 42.6 %
Single Plane A4C EF: 42.9 %
Weight: 1940.05 oz

## 2021-06-30 LAB — HEMOGLOBIN A1C
Hgb A1c MFr Bld: 5.3 % (ref 4.8–5.6)
Mean Plasma Glucose: 105.41 mg/dL

## 2021-06-30 LAB — LIPID PANEL
Cholesterol: 142 mg/dL (ref 0–200)
HDL: 30 mg/dL — ABNORMAL LOW (ref >40)
LDL Cholesterol: 75 mg/dL (ref 0–99)
Total CHOL/HDL Ratio: 4.7 RATIO
Triglycerides: 183 mg/dL — ABNORMAL HIGH (ref <150)
VLDL: 37 mg/dL (ref 0–40)

## 2021-06-30 LAB — CBC
HCT: 28 % — ABNORMAL LOW (ref 36.0–46.0)
Hemoglobin: 8.5 g/dL — ABNORMAL LOW (ref 12.0–15.0)
MCH: 25.4 pg — ABNORMAL LOW (ref 26.0–34.0)
MCHC: 30.4 g/dL (ref 30.0–36.0)
MCV: 83.6 fL (ref 80.0–100.0)
Platelets: 302 10*3/uL (ref 150–400)
RBC: 3.35 MIL/uL — ABNORMAL LOW (ref 3.87–5.11)
RDW: 25.2 % — ABNORMAL HIGH (ref 11.5–15.5)
WBC: 17.6 10*3/uL — ABNORMAL HIGH (ref 4.0–10.5)
nRBC: 0 % (ref 0.0–0.2)

## 2021-06-30 LAB — PROTIME-INR
INR: 1.3 — ABNORMAL HIGH (ref 0.8–1.2)
Prothrombin Time: 16.1 seconds — ABNORMAL HIGH (ref 11.4–15.2)

## 2021-06-30 LAB — TROPONIN I (HIGH SENSITIVITY): Troponin I (High Sensitivity): >24000 ng/L (ref <18)

## 2021-06-30 LAB — HEPARIN LEVEL (UNFRACTIONATED): Heparin Unfractionated: 0.3 IU/mL (ref 0.30–0.70)

## 2021-06-30 LAB — MAGNESIUM: Magnesium: 1.6 mg/dL — ABNORMAL LOW (ref 1.7–2.4)

## 2021-06-30 LAB — APTT: aPTT: 49 seconds — ABNORMAL HIGH (ref 24–36)

## 2021-06-30 MED ORDER — MAGNESIUM SULFATE 2 GM/50ML IV SOLN
2.0000 g | Freq: Once | INTRAVENOUS | Status: AC
  Administered 2021-06-30: 2 g via INTRAVENOUS
  Filled 2021-06-30: qty 50

## 2021-06-30 MED ORDER — HEPARIN (PORCINE) 25000 UT/250ML-% IV SOLN
950.0000 [IU]/h | INTRAVENOUS | Status: DC
  Administered 2021-06-30: 650 [IU]/h via INTRAVENOUS
  Administered 2021-07-01: 950 [IU]/h via INTRAVENOUS
  Filled 2021-06-30 (×2): qty 250

## 2021-06-30 MED ORDER — ACETAMINOPHEN 160 MG/5ML PO SOLN
1000.0000 mg | Freq: Four times a day (QID) | ORAL | Status: AC
  Administered 2021-06-30 – 2021-07-04 (×18): 1000 mg via ORAL
  Administered 2021-07-05: 650 mg via ORAL
  Administered 2021-07-05 – 2021-07-06 (×5): 1000 mg via ORAL
  Filled 2021-06-30 (×25): qty 40.6

## 2021-06-30 MED ORDER — HEPARIN (PORCINE) 25000 UT/250ML-% IV SOLN
12.0000 [IU]/kg/h | INTRAVENOUS | Status: DC
Start: 2021-06-30 — End: 2021-06-30

## 2021-06-30 MED ORDER — ISOSORBIDE MONONITRATE ER 30 MG PO TB24
30.0000 mg | ORAL_TABLET | Freq: Every day | ORAL | Status: DC
  Administered 2021-06-30 – 2021-07-01 (×2): 30 mg via ORAL
  Filled 2021-06-30 (×5): qty 1

## 2021-06-30 MED ORDER — POTASSIUM CHLORIDE IN NACL 20-0.9 MEQ/L-% IV SOLN
INTRAVENOUS | Status: DC
  Filled 2021-06-30 (×5): qty 1000

## 2021-06-30 NOTE — Progress Notes (Signed)
Progress Note  Patient Name: Robin Estrada Date of Encounter: 06/30/2021  Primary Cardiologist: None   Subjective   No cp in interrim. Has feeling of tension in both hands post op. OK for Full AC after surgery eval.  Inpatient Medications    Scheduled Meds:  sodium chloride   Intravenous Once   acetaminophen  1,000 mg Oral Q6H   aspirin EC  81 mg Oral Daily   Chlorhexidine Gluconate Cloth  6 each Topical Daily   dicyclomine  10 mg Oral Once   enoxaparin (LOVENOX) injection  40 mg Subcutaneous Q24H   feeding supplement  237 mL Oral BID BM   isosorbide mononitrate  30 mg Oral Daily   pantoprazole (PROTONIX) IV  40 mg Intravenous Q24H   pravastatin  20 mg Oral q1800   Continuous Infusions:  0.9 % NaCl with KCl 20 mEq / L 75 mL/hr at 06/30/21 0900   nitroGLYCERIN 10 mcg/min (06/29/21 1614)   piperacillin-tazobactam (ZOSYN)  IV 12.5 mL/hr at 06/30/21 0900   PRN Meds: alum & mag hydroxide-simeth, dicyclomine, diphenhydrAMINE **OR** diphenhydrAMINE, fentaNYL (SUBLIMAZE) injection, HYDROmorphone (DILAUDID) injection, ondansetron **OR** ondansetron (ZOFRAN) IV, simethicone   Vital Signs    Vitals:   06/30/21 0715 06/30/21 0800 06/30/21 0848 06/30/21 0900  BP: (!) 130/48 (!) 145/57  (!) 151/69  Pulse: 78 76 82 (!) 103  Resp: (!) 22 18 (!) 28 20  Temp:   97.7 F (36.5 C)   TempSrc:   Oral   SpO2: 96% 96% 97% 98%  Weight:        Intake/Output Summary (Last 24 hours) at 06/30/2021 1056 Last data filed at 06/30/2021 0900 Gross per 24 hour  Intake 2353.62 ml  Output 1350 ml  Net 1003.62 ml   Filed Weights   06/30/21 0455  Weight: 55 kg    Telemetry    SR with PVCs - Personally Reviewed   Physical Exam   Gen: no distress, elderly female Neck: No JVD,  Cardiac: No Rubs or Gallops, regular rate with systolic and diastolic murmurs Respiratory: Clear to auscultation bilaterally, normal effort, normal  respiratory rate GI: Soft, slighty tender abdomen MS: No   edema;  moves all extremities Integument: Skin feels warm Neuro:  At time of evaluation, alert and oriented to person/place/time/situation  Psych: Normal affect, patient feels better than she expected   Labs    Chemistry Recent Labs  Lab 06/28/21 1644 06/28/21 1758 06/29/21 0623 06/29/21 1016 06/29/21 1340 06/30/21 0320  NA 130*   < > 133* 134* 132* 134*  K 3.8   < > 3.2* 2.7* 3.1* 3.2*  CL 95*   < > 100 99  --  101  CO2 23  --  23  --   --  22  GLUCOSE 109*   < > 95 92  --  161*  BUN 8   < > 6* <3*  --  6*  CREATININE 0.69   < > 0.51 0.30*  --  0.52  CALCIUM 8.4*  --  7.8*  --   --  7.7*  PROT 6.1*  --  5.3*  --   --   --   ALBUMIN 2.5*  --  2.1*  --   --   --   AST 24  --  17  --   --   --   ALT 8  --  9  --   --   --   ALKPHOS 165*  --  132*  --   --   --  BILITOT 1.9*  --  0.7  --   --   --   GFRNONAA >60  --  >60  --   --  >60  ANIONGAP 12  --  10  --   --  11   < > = values in this interval not displayed.     Hematology Recent Labs  Lab 06/28/21 1644 06/28/21 1758 06/29/21 0623 06/29/21 1016 06/29/21 1340 06/30/21 0320  WBC 21.4*  --  18.6*  --   --  17.6*  RBC 3.74*  --  3.22*  --   --  3.35*  HGB 9.3*   < > 8.1* 8.8* 9.2* 8.5*  HCT 30.8*   < > 27.0* 26.0* 27.0* 28.0*  MCV 82.4  --  83.9  --   --  83.6  MCH 24.9*  --  25.2*  --   --  25.4*  MCHC 30.2  --  30.0  --   --  30.4  RDW 26.5*  --  25.7*  --   --  25.2*  PLT 383  --  333  --   --  302   < > = values in this interval not displayed.    Cardiac EnzymesNo results for input(s): TROPONINI in the last 168 hours. No results for input(s): TROPIPOC in the last 168 hours.   BNPNo results for input(s): BNP, PROBNP in the last 168 hours.   DDimer No results for input(s): DDIMER in the last 168 hours.   Radiology    CT ABDOMEN PELVIS W CONTRAST  Result Date: 06/28/2021 CLINICAL DATA:  Abdominal pain and nausea x5 days. EXAM: CT ABDOMEN AND PELVIS WITH CONTRAST TECHNIQUE: Multidetector CT imaging  of the abdomen and pelvis was performed using the standard protocol following bolus administration of intravenous contrast. RADIATION DOSE REDUCTION: This exam was performed according to the departmental dose-optimization program which includes automated exposure control, adjustment of the mA and/or kV according to patient size and/or use of iterative reconstruction technique. CONTRAST:  140mL OMNIPAQUE IOHEXOL 300 MG/ML  SOLN COMPARISON:  May 03, 2021 FINDINGS: Lower chest: No acute abnormality. Hepatobiliary: No focal liver abnormality is seen. No gallstones, gallbladder wall thickening, or biliary dilatation. Pancreas: A stable 2.5 cm x 1.8 cm cystic appearing structure is seen within the tail of the pancreas (axial CT image 27, CT series 2). Spleen: Normal in size without focal abnormality. Adrenals/Urinary Tract: Adrenal glands are unremarkable. Kidneys are normal, without renal calculi or hydronephrosis. Multiple subcentimeter simple cysts are seen within both kidneys. The largest measures approximately 12 mm in diameter. Bladder is unremarkable. Stomach/Bowel: Stomach is within normal limits. The appendix is poorly visualized. There is intussusception of the cecum and distal ileum within the length of the ascending colon (axial CT images 38 through 54, CT series number 2). Subsequent dilatation of the ascending colon is seen (approximately 5.1 cm). The visualized loops of small bowel are normal in caliber. Vascular/Lymphatic: Aortic atherosclerosis. No enlarged abdominal or pelvic lymph nodes. Reproductive: Small heterogeneous uterine fibroids are seen within the body of the uterus on the right. The bilateral adnexa are unremarkable. Other: No abdominal wall hernia or abnormality. No abdominopelvic ascites. Musculoskeletal: A compression fracture deformity is seen at the level of T11 with marked severity interval decrease in vertebral body height since the prior study. An expansile lytic lesion and  associated pathological fracture is seen involving the anterolateral aspect of the fifth right rib (axial CT image 1, CT series 2). Numerous lytic lesions of  various sizes are seen scattered throughout the thoracolumbar spine and pelvis. IMPRESSION: 1. Intussusception involving the cecum and distal ileum within the length of the ascending colon, with subsequent bowel obstruction. 2. Numerous lytic lesions of various sizes scattered throughout the thoracolumbar spine and pelvis, consistent with osseous metastatic disease. 3. Pathologic fracture deformity at the level of T11 with marked severity interval decrease in vertebral body height since the prior study. 4. Stable cystic lesion within the tail of the pancreas. 5. Small heterogeneous uterine fibroids. 6. Aortic atherosclerosis. Aortic Atherosclerosis (ICD10-I70.0). Electronically Signed   By: Virgina Norfolk M.D.   On: 06/28/2021 19:41   DG Chest Port 1 View  Result Date: 06/29/2021 CLINICAL DATA:  Status post code blue, concern for pneumothorax EXAM: PORTABLE CHEST 1 VIEW COMPARISON:  06/29/2021 FINDINGS: Right neck vascular catheter. The heart size and mediastinal contours are within normal limits. Lobulated mass projects over the right midlung. No significant pneumothorax. No acute airspace opacity. Small volume pneumoperitoneum underlying the right hemidiaphragm. The visualized skeletal structures are unremarkable. IMPRESSION: 1. No significant pneumothorax or obvious displaced rib fracture in single AP portable view. 2. Small volume pneumoperitoneum underlying the right hemidiaphragm. These results will be called to the ordering clinician or representative by the Radiologist Assistant, and communication documented in the PACS or [employer]. Electronically Signed   By: Delanna Ahmadi M.D.   On: 06/29/2021 13:47   DG Chest Port 1 View  Result Date: 06/29/2021 CLINICAL DATA:  Central line placement. EXAM: PORTABLE CHEST 1 VIEW COMPARISON:   05/14/2021 FINDINGS: 1212 hours. Right IJ central line tip overlies the distal SVC level near the expected junction with the RA. No evidence for pneumothorax. Right pulmonary lesion again noted. Left lung clear. Telemetry leads overlie the chest. IMPRESSION: Right IJ central line tip overlies the distal SVC level. No pneumothorax. Electronically Signed   By: Misty Stanley M.D.   On: 06/29/2021 12:38    Cardiac Studies   LVEF is mildly reduced with mild AI, Moderate + mR and inferior WMA  Patient Profile     76 y.o. female with post operative MI after metastatic breast cancer-> colectomy  Assessment & Plan    Cardiac Arrest Inferior STEMI Metastatic cancer S/p colectomy Ischemic cardiomyopathy - not a candidate for cath (IC has reviewed) - on ASA - will trial full AC today (heparin) - starting Imdur with hopes to drip nitro drip - if continues to improve will start low dose BB - discussed care with patient, family and PC  For questions or updates, please contact [facility] Please consult [email or web address] for contact info under Cardiology/STEMI.      Signed, Werner Lean, MD  06/30/2021, 10:56 AM

## 2021-06-30 NOTE — Assessment & Plan Note (Addendum)
We discussed Robin Estrada's diagnosis of metastatic breast cancer and its progression.  As she has been before, the patient is in denial of her diagnosis of metastatic cancer and it's implication on her health.  I told [name], her son, that in my opinion her metastatic cancer is becoming overwhelming.  I told the patient and her son that palliative care or hospice involvement is appropriate at this time.  I told them that she needs to reconsider opioids because nothing else would probably do the job of her pain control.  They will need to follow-up with Dr. Burr Medico.

## 2021-06-30 NOTE — Progress Notes (Signed)
PROGRESS NOTE    Robin Estrada  LFY:[id number] DOB: 1946-04-16 DOA: 06/28/2021 PCP: [name], MD   Brief Narrative:  76 year old [name] with a history of lung cancer, history of breast cancer, new vertebral masses presents the ER today with a several day history of worsening abdominal pain, nausea. Labs showed a white count of 21.4, hemoglobin of 9.3, platelets of 383. Chemistry sodium 130, BUN of 8, creatinine 0.79. UA is essentially negative.   CT abdomen with IV contrast showed large intussusception of the cecum and distal ileum within the length of the ascending colon and subsequent dilatation of the ascending colon. There are also noted various and numerous lytic lesions throughout the thoracolumbar spine and pelvis consistent with metastatic disease. General surgery was consulted.  Patient started on broad-spectrum antibiotics and [facility] hospital[facility] consulted for admission for further evaluation and management.    Assessment & Plan:  Cecal and distal ileum intussusception with bowel obstruction: -Patient presented with severe abdominal pain -She is afebrile with leukocytosis of 21.4.  Lactic acid: WNL -S/p laparoscopic right colectomy with end ileostomy POD 1 -Leukocytosis improved from 21.4-18.6-17.6 -Continue IV fluids and IV Zosyn, as needed promethazine for nausea and vomiting, and as needed pain medications -Advance diet as tolerated, monitor ostomy output, watch for dehydration -Appreciate general surgery help  Postop cardiac arrest Inferior ST EMI Ischemic cardiomyopathy -Troponin trended up.  Patient is not a good candidate for cardiac cath -Transthoracic echo shows ejection fraction of 50%, the left ventricle demonstrated regional wall motion abnormalities.  Mild LVH,  diastolic parameters indeterminate. -Continue aspirin.  Okay to start full anticoagulation as per general surgery.  Patient started on full dose heparin -Started on Imdur.  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   -  Patient underwent CT-guided lung biopsy on 05/14/2021-results consistent of poorly differentiated malignancy with sarcomatoid and epithelioid features.   -Continue as needed pain medications  Hypokalemia: Replenished.  Magnesium level: Low -Repeat BMP tomorrow a.m.  Hypomagnesemia: replenished -repeat magnesium tomorrow.  History of GI bleed: -cont. Protonix   Hypertension: Stable -Imdur. Monitor for now.  Plan is to add low-dose beta-blocker if she continues to improve.  Acute blood loss anemia -H&H dropped from 10.9-8.5.  Status post 1 unit blood transfusion on 2/19. -Continue to monitor H&H closely.  Transfuse if hemoglobin is less than 8  Aortic atherosclerosis: Noted on recent CT  Cystic mass of pancreas: -Previous CA 19-9 level normal. -Follow-up outpatient  Patient is at increased risk of mortality and morbidity due to chronic medical issues.  Palliative care is consulted to discuss further goals of care with patient and her family.  DVT prophylaxis: SCD/heparin Code Status: Full code Family Communication:  None present at bedside.  Plan of care discussed with patient in length and she verbalized understanding and agreed with it. Disposition Plan: To be determined  Consultants:  General surgery PCCM Palliative care Cardiology  Procedures:  Laparoscopic c left right colectomy with end ileostomy  Antimicrobials:  Zosyn  Status is: Inpatient     Subjective: Patient seen and examined.  Son at the bedside.  Patient denies any chest pain.  Remained afebrile.  No acute events overnight.  Objective: Vitals:   06/30/21 0848 06/30/21 0900 06/30/21 1000 06/30/21 1100  BP:  (!) 151/69 (!) 131/55 135/66  Pulse: 82 (!)  103 82 83  Resp:  (!) 28 20 (!) 25 (!) 26  Temp: 97.7 F (36.5 C)     TempSrc: Oral     SpO2: 97% 98% 97% 98%  Weight:        Intake/Output Summary (Last 24 hours) at 06/30/2021 1155 Last data filed at 06/30/2021 1100 Gross per 24 hour  Intake 1806.57 ml  Output 800 ml  Net 1006.57 ml    Filed Weights   06/30/21 0455  Weight: 55 kg    Examination:  General exam: Appears calm and comfortable, on nasal cannula, appears weak and sick  respiratory system: Clear to auscultation. Respiratory effort normal. Cardiovascular system: S1 & S2 heard, RRR. No JVD, murmurs, rubs, gallops or clicks. No pedal edema. Gastrointestinal system: Abdomen is soft, ileostomy bag in place.  Generalized abdominal tenderness positive.  Central nervous system: Alert and oriented. No focal neurological deficits. Skin: No rashes, lesions or ulcers Psychiatry: Judgement and insight appear normal. Mood & affect appropriate.    Data Reviewed: I have personally reviewed following labs and imaging studies  CBC: Recent Labs  Lab 06/28/21 1644 06/28/21 1758 06/29/21 0623 06/29/21 1016 06/29/21 1340 06/30/21 0320  WBC 21.4*  --  18.6*  --   --  17.6*  NEUTROABS 19.6*  --  16.9*  --   --   --   HGB 9.3* 10.9* 8.1* 8.8* 9.2* 8.5*  HCT 30.8* 32.0* 27.0* 26.0* 27.0* 28.0*  MCV 82.4  --  83.9  --   --  83.6  PLT 383  --  333  --   --  161    Basic Metabolic Panel: Recent Labs  Lab 06/28/21 1644 06/28/21 1758 06/29/21 0623 06/29/21 1016 06/29/21 1340 06/30/21 0320  NA 130* 132* 133* 134* 132* 134*  K 3.8 3.4* 3.2* 2.7* 3.1* 3.2*  CL 95* 94* 100 99  --  101  CO2 23  --  23  --   --  22  GLUCOSE 109* 107* 95 92  --  161*  BUN 8 8 6* <3*  --  6*  CREATININE 0.69 0.50 0.51 0.30*  --  0.52  CALCIUM 8.4*  --  7.8*  --   --  7.7*  MG  --   --  1.8  --   --  1.6*    GFR: Estimated Creatinine Clearance: 46.6 mL/min (by C-G formula based on SCr of 0.52 mg/dL). Liver Function Tests: Recent Labs  Lab 06/28/21 1644  06/29/21 0623  AST 24 17  ALT 8 9  ALKPHOS 165* 132*  BILITOT 1.9* 0.7  PROT 6.1* 5.3*  ALBUMIN 2.5* 2.1*    Recent Labs  Lab 06/28/21 1644  LIPASE 27    No results for input(s): AMMONIA in the last 168 hours. Coagulation Profile: No results for input(s): INR, PROTIME in the last 168 hours. Cardiac Enzymes: No results for input(s): CKTOTAL, CKMB, CKMBINDEX, TROPONINI in the last 168 hours. BNP (last 3 results) No results for input(s): PROBNP in the last 8760 hours. HbA1C: Recent Labs    06/30/21 0320  HGBA1C 5.3   CBG: No results for input(s): GLUCAP in the last 168 hours. Lipid Profile: Recent Labs    06/30/21 0320  CHOL 142  HDL 30*  LDLCALC 75  TRIG 183*  CHOLHDL 4.7   Thyroid Function Tests: No results for input(s): TSH, T4TOTAL, FREET4, T3FREE, THYROIDAB in the last 72 hours. Anemia Panel: Recent Labs    06/28/21 1658  TIBC 132*  IRON 47    Sepsis Labs: Recent Labs  Lab 06/28/21 2028 06/29/21 3419  LATICACIDVEN 1.2 0.8     Recent Results (from the past 240 hour(s))  Resp Panel by RT-PCR (Flu A&B, Covid) Nasopharyngeal Swab     Status: None   Collection Time: 06/28/21  8:00 PM   Specimen: Nasopharyngeal Swab; Nasopharyngeal(NP) swabs in vial transport medium  Result Value Ref Range Status   SARS Coronavirus 2 by RT PCR NEGATIVE NEGATIVE Final    Comment: (NOTE) SARS-CoV-2 target nucleic acids are NOT DETECTED.  The SARS-CoV-2 RNA is generally detectable in upper respiratory specimens during the acute phase of infection. The lowest concentration of SARS-CoV-2 viral copies this assay can detect is 138 copies/mL. A negative result does not preclude SARS-Cov-2 infection and should not be used as the sole basis for treatment or other patient management decisions. A negative result may occur with  improper specimen collection/handling, submission of specimen other than nasopharyngeal swab, presence of viral mutation(s) within the areas  targeted by this assay, and inadequate number of viral copies(<138 copies/mL). A negative result must be combined with clinical observations, patient history, and epidemiological information. The expected result is Negative.  Fact Sheet for Patients:  EntrepreneurPulse.com.au  Fact Sheet for Healthcare Providers:  IncredibleEmployment.be  This test is no t yet approved or cleared by the [place] FDA and  has been authorized for detection and/or diagnosis of SARS-CoV-2 by FDA under an Emergency Use Authorization (EUA). This EUA will remain  in effect (meaning this test can be used) for the duration of the COVID-19 declaration under Section 564(b)(1) of the Act, 21 U.S.C.section 360bbb-3(b)(1), unless the authorization is terminated  or revoked sooner.       Influenza A by PCR NEGATIVE NEGATIVE Final   Influenza B by PCR NEGATIVE NEGATIVE Final    Comment: (NOTE) The Xpert Xpress SARS-CoV-2/FLU/RSV plus assay is intended as an aid in the diagnosis of influenza from Nasopharyngeal swab specimens and should not be used as a sole basis for treatment. Nasal washings and aspirates are unacceptable for Xpert Xpress SARS-CoV-2/FLU/RSV testing.  Fact Sheet for Patients: EntrepreneurPulse.com.au  Fact Sheet for Healthcare Providers: IncredibleEmployment.be  This test is not yet approved or cleared by the [place] FDA and has been authorized for detection and/or diagnosis of SARS-CoV-2 by FDA under an Emergency Use Authorization (EUA). This EUA will remain in effect (meaning this test can be used) for the duration of the COVID-19 declaration under Section 564(b)(1) of the Act, 21 U.S.C. section 360bbb-3(b)(1), unless the authorization is terminated or revoked.  Performed at [facility], [place] [address] [place], [place] [address]        Radiology Studies: CT ABDOMEN PELVIS W  CONTRAST  Result Date: 06/28/2021 CLINICAL DATA:  Abdominal pain and nausea x5 days. EXAM: CT ABDOMEN AND PELVIS WITH CONTRAST TECHNIQUE: Multidetector CT imaging of the abdomen and pelvis was performed using the standard protocol following bolus administration of intravenous contrast. RADIATION DOSE REDUCTION: This exam was performed according to the departmental dose-optimization program which includes automated exposure control, adjustment of the mA and/or kV according to patient size and/or use of iterative reconstruction technique. CONTRAST:  133mL OMNIPAQUE IOHEXOL 300 MG/ML  SOLN COMPARISON:  May 03, 2021 FINDINGS: Lower chest: No acute abnormality. Hepatobiliary: No focal liver abnormality is seen. No gallstones, gallbladder wall thickening, or biliary dilatation. Pancreas: A stable 2.5 cm x 1.8 cm cystic appearing structure is seen within the tail of the pancreas (axial  CT image 27, CT series 2). Spleen: Normal in size without focal abnormality. Adrenals/Urinary Tract: Adrenal glands are unremarkable. Kidneys are normal, without renal calculi or hydronephrosis. Multiple subcentimeter simple cysts are seen within both kidneys. The largest measures approximately 12 mm in diameter. Bladder is unremarkable. Stomach/Bowel: Stomach is within normal limits. The appendix is poorly visualized. There is intussusception of the cecum and distal ileum within the length of the ascending colon (axial CT images 38 through 54, CT series number 2). Subsequent dilatation of the ascending colon is seen (approximately 5.1 cm). The visualized loops of small bowel are normal in caliber. Vascular/Lymphatic: Aortic atherosclerosis. No enlarged abdominal or pelvic lymph nodes. Reproductive: Small heterogeneous uterine fibroids are seen within the body of the uterus on the right. The bilateral adnexa are unremarkable. Other: No abdominal wall hernia or abnormality. No abdominopelvic ascites. Musculoskeletal: A compression  fracture deformity is seen at the level of T11 with marked severity interval decrease in vertebral body height since the prior study. An expansile lytic lesion and associated pathological fracture is seen involving the anterolateral aspect of the fifth right rib (axial CT image 1, CT series 2). Numerous lytic lesions of various sizes are seen scattered throughout the thoracolumbar spine and pelvis. IMPRESSION: 1. Intussusception involving the cecum and distal ileum within the length of the ascending colon, with subsequent bowel obstruction. 2. Numerous lytic lesions of various sizes scattered throughout the thoracolumbar spine and pelvis, consistent with osseous metastatic disease. 3. Pathologic fracture deformity at the level of T11 with marked severity interval decrease in vertebral body height since the prior study. 4. Stable cystic lesion within the tail of the pancreas. 5. Small heterogeneous uterine fibroids. 6. Aortic atherosclerosis. Aortic Atherosclerosis (ICD10-I70.0). Electronically Signed   By: Virgina Norfolk M.D.   On: 06/28/2021 19:41   DG Chest Port 1 View  Result Date: 06/29/2021 CLINICAL DATA:  Status post code blue, concern for pneumothorax EXAM: PORTABLE CHEST 1 VIEW COMPARISON:  06/29/2021 FINDINGS: Right neck vascular catheter. The heart size and mediastinal contours are within normal limits. Lobulated mass projects over the right midlung. No significant pneumothorax. No acute airspace opacity. Small volume pneumoperitoneum underlying the right hemidiaphragm. The visualized skeletal structures are unremarkable. IMPRESSION: 1. No significant pneumothorax or obvious displaced rib fracture in single AP portable view. 2. Small volume pneumoperitoneum underlying the right hemidiaphragm. These results will be called to the ordering clinician or representative by the Radiologist Assistant, and communication documented in the PACS or [employer]. Electronically Signed   By: Delanna Ahmadi  M.D.   On: 06/29/2021 13:47   DG Chest Port 1 View  Result Date: 06/29/2021 CLINICAL DATA:  Central line placement. EXAM: PORTABLE CHEST 1 VIEW COMPARISON:  05/14/2021 FINDINGS: 1212 hours. Right IJ central line tip overlies the distal SVC level near the expected junction with the RA. No evidence for pneumothorax. Right pulmonary lesion again noted. Left lung clear. Telemetry leads overlie the chest. IMPRESSION: Right IJ central line tip overlies the distal SVC level. No pneumothorax. Electronically Signed   By: Misty Stanley M.D.   On: 06/29/2021 12:38    Scheduled Meds:  sodium chloride   Intravenous Once   acetaminophen  1,000 mg Oral Q6H   aspirin EC  81 mg Oral Daily   Chlorhexidine Gluconate Cloth  6 each Topical Daily   dicyclomine  10 mg Oral Once   feeding supplement  237 mL Oral BID BM   isosorbide mononitrate  30 mg Oral Daily   pantoprazole (  PROTONIX) IV  40 mg Intravenous Q24H   pravastatin  20 mg Oral q1800   Continuous Infusions:  0.9 % NaCl with KCl 20 mEq / L 75 mL/hr at 06/30/21 1100   heparin     nitroGLYCERIN 10 mcg/min (06/29/21 1614)   piperacillin-tazobactam (ZOSYN)  IV Stopped (06/30/21 0913)     LOS: 2 days   Time spent: 45 minutes   Dionna Wiedemann Loann Quill, MD [facility]  If 7PM-7AM, please contact night-coverage [email or web address] 06/30/2021, 11:55 AM
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
## 2021-06-30 NOTE — Progress Notes (Signed)
PT Cancellation Note  Patient Details Name: Robin Estrada MRN: [id number] DOB: [birth year]   Cancelled Treatment:     PT order received but eval deferred.  Pt adamantly refusing stating she has pain, she just had surgery yesterday and then lifts gown to show dressings in place.  Will follow.   Stokely Jeancharles 06/30/2021, 3:19 PM

## 2021-06-30 NOTE — Assessment & Plan Note (Signed)
Abdominal pain of unclear etiology, severe.  The patient appears toxic.  I told the patient and her son that in my opinion her pain is primarily related to her metastatic cancer which may cause all sorts of gastrointestinal complications.  I advised her to go to the emergency room now for diagnostic work-up and treatment.  They are in agreement.  We will call ER triage.

## 2021-06-30 NOTE — Progress Notes (Signed)
ANTICOAGULATION CONSULT NOTE    Pharmacy Consult for IV heparin Indication: chest pain/ACS  Allergies  Allergen Reactions   Statins Nausea And Vomiting and Other (See Comments)    MUSCLE PAIN   Amlodipine     fatigue   Clonidine Derivatives     headache   Losartan     abd pain   Sulfa Antibiotics Nausea Only   Sulfamethoxazole-Trimethoprim Nausea Only    " Severe Nausea "    Patient Measurements: Weight: 55 kg (121 lb 4.1 oz) Heparin Dosing Weight: 55 kg  Vital Signs: Temp: 97.5 F (36.4 C) (02/19 2000) Temp Source: Oral (02/19 2000) BP: 126/62 (02/19 2045) Pulse Rate: 105 (02/19 2045)  Labs: Recent Labs    06/28/21 1644 06/28/21 1758 06/29/21 0623 06/29/21 1016 06/29/21 1340 06/29/21 1621 06/29/21 2004 06/30/21 0320 06/30/21 0518 06/30/21 1443 06/30/21 2129  HGB 9.3*   < > 8.1* 8.8* 9.2*  --   --  8.5*  --   --   --   HCT 30.8*   < > 27.0* 26.0* 27.0*  --   --  28.0*  --   --   --   PLT 383  --  333  --   --   --   --  302  --   --   --   APTT  --   --   --   --   --   --   --   --   --  49*  --   LABPROT  --   --   --   --   --   --   --   --   --  16.1*  --   INR  --   --   --   --   --   --   --   --   --  1.3*  --   HEPARINUNFRC  --   --   --   --   --   --   --   --   --   --  0.30  CREATININE 0.69   < > 0.51 0.30*  --   --   --  0.52  --   --   --   TROPONINIHS  --   --   --   --   --  952* 14,681*  --  >24,000*  --   --    < > = values in this interval not displayed.     Estimated Creatinine Clearance: 46.6 mL/min (by C-G formula based on SCr of 0.52 mg/dL).   Medical History: Past Medical History:  Diagnosis Date   Breast mass, left    Large   Cancer ([place])    FIBROMYOMA  AGE 51-   BENIGN  RESOLVED   GI bleed 05/03/2021   1/23 continue with pantoprazole   Headache    Heart murmur    Hypertension    Mitral valve prolapse    Tuberculosis    AS CHILD  W/ TX    Medications:  No anticoagulation meds prior to admission     Assessment: Pharmacy consulted to dose IV heparin for this 76 yo female with h/o metastatic cancer who is post-op lap R colectomy, end ileostomy.  Experienced STEMI and cardiac arrest in PACU on 2/19.  Cards trialing full [facility] 2/19 with heparin.   First heparin level = 0.3 with heparin gtt @ 650 units/hr Confirmed with RN no complications of heparin  therapy and no line issues Heparin level at very low end of therapeutic range; will increase rate slightly to maintain therapeutic level  Goal of Therapy:  Heparin level 0.3-0.7 units/ml Monitor platelets by anticoagulation protocol: Yes   Plan:  Increase heparin infusion to 700 units/hr Check heparin level in 8 hours to ensure therapeutic level Daily heparin level & CBC  Monitor signs/symptoms of bleeding   Gordan Grell, Pharm,D 06/30/2021 11:08 PM

## 2021-06-30 NOTE — Progress Notes (Signed)
ANTICOAGULATION CONSULT NOTE - Initial Consult  Pharmacy Consult for IV heparin Indication: chest pain/ACS  Allergies  Allergen Reactions   Statins Nausea And Vomiting and Other (See Comments)    MUSCLE PAIN   Amlodipine     fatigue   Clonidine Derivatives     headache   Losartan     abd pain   Sulfa Antibiotics Nausea Only   Sulfamethoxazole-Trimethoprim Nausea Only    " Severe Nausea "    Patient Measurements: Weight: 55 kg (121 lb 4.1 oz) Heparin Dosing Weight: 55 kg  Vital Signs: Temp: 97.7 F (36.5 C) (02/19 0848) Temp Source: Oral (02/19 0848) BP: 151/69 (02/19 0900) Pulse Rate: 103 (02/19 0900)  Labs: Recent Labs    06/28/21 1644 06/28/21 1758 06/29/21 0623 06/29/21 1016 06/29/21 1340 06/29/21 1621 06/29/21 2004 06/30/21 0320 06/30/21 0518  HGB 9.3*   < > 8.1* 8.8* 9.2*  --   --  8.5*  --   HCT 30.8*   < > 27.0* 26.0* 27.0*  --   --  28.0*  --   PLT 383  --  333  --   --   --   --  302  --   CREATININE 0.69   < > 0.51 0.30*  --   --   --  0.52  --   TROPONINIHS  --   --   --   --   --  952* 14,681*  --  >24,000*   < > = values in this interval not displayed.    Estimated Creatinine Clearance: 46.6 mL/min (by C-G formula based on SCr of 0.52 mg/dL).   Medical History: Past Medical History:  Diagnosis Date   Breast mass, left    Large   Cancer ([place])    FIBROMYOMA  AGE 62-   BENIGN  RESOLVED   GI bleed 05/03/2021   1/23 continue with pantoprazole   Headache    Heart murmur    Hypertension    Mitral valve prolapse    Tuberculosis    AS CHILD  W/ TX    Medications:  No anticoagulation meds prior to admission Currently on enoxaparin 40 mg subq every 24 hours for DVT prophylaxis, LD 2/19 0814  Assessment: Pharmacy consulted to dose IV heparin for this 76 yo female with h/o metastatic cancer who is post-op lap R colectomy, end ileostomy.  Experienced STEMI and cardiac arrest in PACU on 2/19.  Cards trialing full AC today with heparin.    Goal of Therapy:  Heparin level 0.3-0.7 units/ml Monitor platelets by anticoagulation protocol: Yes   Plan:  Obtain baseline aPTT and PT/INR Discontinue enoxaparin and omit initial heparin bolus Start heparin infusion at 650 units/hr Check heparin level in 8 hours and daily while on heparin Monitor CBC, signs/symptoms of bleeding   Royetta Asal, PharmD, [place] Please utilize Amion for appropriate phone number to reach the unit pharmacist ([place]) 06/30/2021 11:17 AM

## 2021-06-30 NOTE — Progress Notes (Signed)
[facility] notified about critical troponin > 24, 000

## 2021-06-30 NOTE — Progress Notes (Signed)
° °  Echocardiogram 2D Echocardiogram has been performed.  Robin Estrada 06/30/2021, 11:05 AM
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
## 2021-07-01 ENCOUNTER — Ambulatory Visit
Admission: RE | Admit: 2021-07-01 | Discharge: 2021-07-01 | Disposition: A | Discharge status: Home / Self Care | Payer: Medicare HMO | Source: Ambulatory Visit | Attending: Radiation Oncology | Admitting: Radiation Oncology

## 2021-07-01 DIAGNOSIS — C50919 Malignant neoplasm of unspecified site of unspecified female breast: Secondary | ICD-10-CM

## 2021-07-01 DIAGNOSIS — K561 Intussusception: Secondary | ICD-10-CM | POA: Diagnosis not present

## 2021-07-01 DIAGNOSIS — C50812 Malignant neoplasm of overlapping sites of left female breast: Secondary | ICD-10-CM

## 2021-07-01 LAB — CBC
HCT: 28.1 % — ABNORMAL LOW (ref 36.0–46.0)
HCT: 31 % — ABNORMAL LOW (ref 36.0–46.0)
Hemoglobin: 8.4 g/dL — ABNORMAL LOW (ref 12.0–15.0)
Hemoglobin: 9.2 g/dL — ABNORMAL LOW (ref 12.0–15.0)
MCH: 25.3 pg — ABNORMAL LOW (ref 26.0–34.0)
MCH: 25.3 pg — ABNORMAL LOW (ref 26.0–34.0)
MCHC: 29.9 g/dL — ABNORMAL LOW (ref 30.0–36.0)
MCHC: 29.7 g/dL — ABNORMAL LOW (ref 30.0–36.0)
MCV: 84.6 fL (ref 80.0–100.0)
MCV: 85.4 fL (ref 80.0–100.0)
Platelets: 298 10*3/uL (ref 150–400)
Platelets: 345 10*3/uL (ref 150–400)
RBC: 3.32 MIL/uL — ABNORMAL LOW (ref 3.87–5.11)
RBC: 3.63 MIL/uL — ABNORMAL LOW (ref 3.87–5.11)
RDW: 25.7 % — ABNORMAL HIGH (ref 11.5–15.5)
RDW: 26.1 % — ABNORMAL HIGH (ref 11.5–15.5)
WBC: 17.5 10*3/uL — ABNORMAL HIGH (ref 4.0–10.5)
WBC: 20.7 10*3/uL — ABNORMAL HIGH (ref 4.0–10.5)
nRBC: 0 % (ref 0.0–0.2)
nRBC: 0 % (ref 0.0–0.2)

## 2021-07-01 LAB — BASIC METABOLIC PANEL
Anion gap: 4 — ABNORMAL LOW (ref 5–15)
BUN: 8 mg/dL (ref 8–23)
CO2: 22 mmol/L (ref 22–32)
Calcium: 7.2 mg/dL — ABNORMAL LOW (ref 8.9–10.3)
Chloride: 107 mmol/L (ref 98–111)
Creatinine, Ser: 0.55 mg/dL (ref 0.44–1.00)
GFR, Estimated: >60 mL/min (ref >60)
Glucose, Bld: 120 mg/dL — ABNORMAL HIGH (ref 70–99)
Potassium: 3.3 mmol/L — ABNORMAL LOW (ref 3.5–5.1)
Sodium: 133 mmol/L — ABNORMAL LOW (ref 135–145)

## 2021-07-01 LAB — HEPARIN LEVEL (UNFRACTIONATED)
Heparin Unfractionated: 0.25 IU/mL — ABNORMAL LOW (ref 0.30–0.70)
Heparin Unfractionated: 0.29 IU/mL — ABNORMAL LOW (ref 0.30–0.70)

## 2021-07-01 LAB — MAGNESIUM: Magnesium: 2.2 mg/dL (ref 1.7–2.4)

## 2021-07-01 MED ORDER — POTASSIUM CHLORIDE 20 MEQ PO PACK
40.0000 meq | PACK | Freq: Once | ORAL | Status: AC
  Administered 2021-07-01: 40 meq via ORAL
  Filled 2021-07-01: qty 2

## 2021-07-01 MED ORDER — CARVEDILOL 3.125 MG PO TABS
3.1250 mg | ORAL_TABLET | Freq: Two times a day (BID) | ORAL | Status: DC
  Administered 2021-07-01 (×2): 3.125 mg via ORAL
  Filled 2021-07-01 (×2): qty 1

## 2021-07-01 MED ORDER — HEPARIN BOLUS VIA INFUSION
1500.0000 [IU] | Freq: Once | INTRAVENOUS | Status: AC
  Administered 2021-07-01: 1500 [IU] via INTRAVENOUS
  Filled 2021-07-01: qty 1500

## 2021-07-01 MED ORDER — POTASSIUM CHLORIDE CRYS ER 20 MEQ PO TBCR: 40.0000 meq | EXTENDED_RELEASE_TABLET | Freq: Once | ORAL | Status: DC

## 2021-07-01 MED ORDER — LIDOCAINE 5 % EX PTCH
1.0000 | MEDICATED_PATCH | CUTANEOUS | Status: DC
  Administered 2021-07-01 – 2021-07-23 (×22): 1 via TRANSDERMAL
  Filled 2021-07-01 (×27): qty 1

## 2021-07-01 NOTE — Progress Notes (Signed)
ANTICOAGULATION CONSULT NOTE    Pharmacy Consult for IV heparin Indication: chest pain/ACS  Allergies  Allergen Reactions   Statins Nausea And Vomiting and Other (See Comments)    MUSCLE PAIN   Amlodipine     fatigue   Clonidine Derivatives     headache   Losartan     abd pain   Sulfa Antibiotics Nausea Only   Sulfamethoxazole-Trimethoprim Nausea Only    " Severe Nausea "    Patient Measurements: Weight: 57.1 kg (125 lb 14.1 oz) Heparin Dosing Weight: 55 kg  Vital Signs: Temp: 97.6 F (36.4 C) (02/20 0400) Temp Source: Oral (02/20 0400) BP: 122/66 (02/20 0700) Pulse Rate: 93 (02/20 0700)  Labs: Recent Labs    06/29/21 0623 06/29/21 1016 06/29/21 1340 06/29/21 1621 06/29/21 2004 06/30/21 0320 06/30/21 0518 06/30/21 1443 06/30/21 2129 07/01/21 0245 07/01/21 0651  HGB 8.1* 8.8* 9.2*  --   --  8.5*  --   --   --  8.4*  --   HCT 27.0* 26.0* 27.0*  --   --  28.0*  --   --   --  28.1*  --   PLT 333  --   --   --   --  302  --   --   --  298  --   APTT  --   --   --   --   --   --   --  49*  --   --   --   LABPROT  --   --   --   --   --   --   --  16.1*  --   --   --   INR  --   --   --   --   --   --   --  1.3*  --   --   --   HEPARINUNFRC  --   --   --   --   --   --   --   --  0.30  --  0.25*  CREATININE 0.51 0.30*  --   --   --  0.52  --   --   --  0.55  --   TROPONINIHS  --   --   --  952* 14,681*  --  >24,000*  --   --   --   --      Estimated Creatinine Clearance: 47.3 mL/min (by C-G formula based on SCr of 0.55 mg/dL).   Medical History: Past Medical History:  Diagnosis Date   Breast mass, left    Large   Cancer ([place])    FIBROMYOMA  AGE 44-   BENIGN  RESOLVED   GI bleed 05/03/2021   1/23 continue with pantoprazole   Headache    Heart murmur    Hypertension    Mitral valve prolapse    Tuberculosis    AS CHILD  W/ TX    Medications:  No anticoagulation meds prior to admission    Assessment: Pharmacy consulted to dose IV heparin for this  76 yo female with h/o metastatic cancer who is post-op lap R colectomy, end ileostomy.  Experienced STEMI and cardiac arrest in PACU on 2/19.  Cards trialing full [facility] 2/19 with heparin.   HL now below goal at 0.25 despite rate increase Confirmed with RN no complications of heparin therapy and no line issues  Goal of Therapy:  Heparin level 0.3-0.7 units/ml Monitor platelets by anticoagulation  protocol: Yes   Plan:  Bolus heparin 1500 units IV Increase heparin infusion to 850 units/hr Check heparin level in 8 hours to ensure therapeutic level Daily heparin level & CBC  Monitor signs/symptoms of bleeding  Ulice Dash, PharmD 07/01/2021 7:30 AM

## 2021-07-01 NOTE — Progress Notes (Signed)
PT Cancellation Note  Patient Details Name: Robin Estrada MRN: [id number] DOB: [birth year]   Cancelled Treatment:    Reason Eval/Treat Not Completed: Pain limiting ability to participate, patient indicating abdomen pain, declined to  mobilize.  [place] Pager [phone] Office [phone].sign    Claretha Cooper 07/01/2021, 2:57 PM

## 2021-07-01 NOTE — Discharge Planning (Signed)
Oncology Discharge Planning Admission Note  [facility] Address: [address], [place], [place] [address] Hours of Operation:  8am - 5pm, Monday - Friday  Clinic Contact Information:  [phone]) [phone]  Oncology Care Team: Medical Oncologist:    Robin Mantis, NP is aware of this hospital admission dated 06/28/21 and has been assessed at the bedside. The cancer center will follow Robin Estrada inpatient care to assist with discharge planning as indicated by the oncologist.  We will arrange for outpatient care prior to discharge.  Disclaimer:  This [place] note does not imply a formal consult request has been made by the admitting attending for this admission or there will be an inpatient consult completed by oncology.  Please request oncology consults as per standard process as indicated.

## 2021-07-01 NOTE — Progress Notes (Signed)
[facility] Progress Note  2 Days Post-Op  Subjective: CC:  NAEO. Having some abd pain with movement. On heparin gtt.   Objective: Vital signs in last 24 hours: Temp:  [97.5 F (36.4 C)-98 F (36.7 C)] 97.6 F (36.4 C) (02/20 0400) Pulse Rate:  [76-108] 93 (02/20 0700) Resp:  [14-31] 22 (02/20 0700) BP: (115-157)/(49-84) 122/66 (02/20 0700) SpO2:  [96 %-100 %] 98 % (02/20 0700) Weight:  [57.1 kg] 57.1 kg (02/20 0414) Last BM Date : 06/30/21  Intake/Output from previous day: 02/19 0701 - 02/20 0700 In: 1972.1 [I.V.:1794.3; IV Piggyback:177.9] Out: 1123 [Urine:973; Stool:150] Intake/Output this shift: No intake/output data recorded.  PE: Gen:  Alert, NAD, pleasant and cooperative  Card:  Regular rate and rhythm, 85 bpm during my exam Pulm:  Normal effort Abd: Soft, appropriately tender, midline dressing clean and recently changed, ileostomy pouch with brown bilious liquid stool in pouch, non bloody. Stoma pale but viable.  Skin: warm and dry, no rashes  Psych: A&Ox3   Lab Results:  Recent Labs    06/30/21 0320 07/01/21 0245  WBC 17.6* 17.5*  HGB 8.5* 8.4*  HCT 28.0* 28.1*  PLT 302 298   BMET Recent Labs    06/30/21 0320 07/01/21 0245  NA 134* 133*  K 3.2* 3.3*  CL 101 107  CO2 22 22  GLUCOSE 161* 120*  BUN 6* 8  CREATININE 0.52 0.55  CALCIUM 7.7* 7.2*   PT/INR Recent Labs    06/30/21 1443  LABPROT 16.1*  INR 1.3*   CMP     Component Value Date/Time   NA 133 (L) 07/01/2021 0245   K 3.3 (L) 07/01/2021 0245   CL 107 07/01/2021 0245   CO2 22 07/01/2021 0245   GLUCOSE 120 (H) 07/01/2021 0245   BUN 8 07/01/2021 0245   CREATININE 0.55 07/01/2021 0245   CREATININE 0.86 11/09/2017 0831   CALCIUM 7.2 (L) 07/01/2021 0245   PROT 5.3 (L) 06/29/2021 0623   ALBUMIN 2.1 (L) 06/29/2021 0623   AST 17 06/29/2021 0623   AST 23 11/09/2017 0831   ALT 9 06/29/2021 0623   ALT 34 11/09/2017 0831   ALKPHOS 132 (H) 06/29/2021 0623   BILITOT 0.7  06/29/2021 0623   BILITOT 0.4 11/09/2017 0831   GFRNONAA >60 07/01/2021 0245   GFRNONAA >60 11/09/2017 0831   GFRAA >60 04/28/2018 1439   GFRAA >60 11/09/2017 0831   Lipase     Component Value Date/Time   LIPASE 27 06/28/2021 1644       Studies/Results: DG Chest Port 1 View  Result Date: 06/29/2021 CLINICAL DATA:  Status post code blue, concern for pneumothorax EXAM: PORTABLE CHEST 1 VIEW COMPARISON:  06/29/2021 FINDINGS: Right neck vascular catheter. The heart size and mediastinal contours are within normal limits. Lobulated mass projects over the right midlung. No significant pneumothorax. No acute airspace opacity. Small volume pneumoperitoneum underlying the right hemidiaphragm. The visualized skeletal structures are unremarkable. IMPRESSION: 1. No significant pneumothorax or obvious displaced rib fracture in single AP portable view. 2. Small volume pneumoperitoneum underlying the right hemidiaphragm. These results will be called to the ordering clinician or representative by the Radiologist Assistant, and communication documented in the PACS or [employer]. Electronically Signed   By: Delanna Ahmadi M.D.   On: 06/29/2021 13:47   DG Chest Port 1 View  Result Date: 06/29/2021 CLINICAL DATA:  Central line placement. EXAM: PORTABLE CHEST 1 VIEW COMPARISON:  05/14/2021 FINDINGS: 1212 hours. Right IJ central line tip overlies  the distal SVC level near the expected junction with the RA. No evidence for pneumothorax. Right pulmonary lesion again noted. Left lung clear. Telemetry leads overlie the chest. IMPRESSION: Right IJ central line tip overlies the distal SVC level. No pneumothorax. Electronically Signed   By: Misty Stanley M.D.   On: 06/29/2021 12:38   ECHOCARDIOGRAM COMPLETE  Result Date: 06/30/2021    ECHOCARDIOGRAM REPORT   Patient Name:   Robin Estrada Date of Exam: 06/30/2021 Medical Rec #:  [id number]      Height:       60.0 in Accession #:    [id number]     Weight:       121.3  lb Date of Birth:  Mar 24, 1946      BSA:          1.509 m Patient Age:    76 years       BP:           130/48 mmHg Patient Gender: F              HR:           79 bpm. Exam Location:  Inpatient Procedure: 2D Echo, Cardiac Doppler and Color Doppler Indications:    R07.9 CHEST PAIN  History:        Patient has prior history of Echocardiogram examinations, most                 recent 07/21/2017. Signs/Symptoms:Murmur; Risk                 Factors:Hypertension. MVP.  Sonographer:    Beryle Beams Referring Phys: [id number] Alphonse Guild BRANCH  Sonographer Comments: No subcostal window. POST OP ABDOMEN IMPRESSIONS  1. Mild inferoseptal hypokinesis. Left ventricular ejection fraction, by estimation, is 50%. The left ventricle has low normal function. The left ventricle demonstrates regional wall motion abnormalities (see scoring diagram/findings for description). There is mild left ventricular hypertrophy. Left ventricular diastolic parameters are indeterminate. Elevated left atrial pressure.  2. Right ventricular systolic function is normal. The right ventricular size is normal. Tricuspid regurgitation signal is inadequate for assessing PA pressure.  3. The mitral valve is abnormal. Mild to moderate mitral valve regurgitation. No evidence of mitral stenosis.  4. The tricuspid valve is abnormal.  5. The aortic valve has an indeterminant number of cusps. Aortic valve regurgitation is mild. No aortic stenosis is present. FINDINGS  Left Ventricle: Mild inferoseptal hypokinesis. Left ventricular ejection fraction, by estimation, is 50%. The left ventricle has low normal function. The left ventricle demonstrates regional wall motion abnormalities. The left ventricular internal cavity size was normal in size. There is mild left ventricular hypertrophy. Left ventricular diastolic parameters are indeterminate. Elevated left atrial pressure. Right Ventricle: The right ventricular size is normal. No increase in right ventricular wall  thickness. Right ventricular systolic function is normal. Tricuspid regurgitation signal is inadequate for assessing PA pressure. Left Atrium: Left atrial size was normal in size. Right Atrium: Right atrial size was normal in size. Pericardium: The pericardium was not well visualized. Mitral Valve: The mitral valve is abnormal. There is mild thickening of the mitral valve leaflet(s). There is mild calcification of the mitral valve leaflet(s). Mild mitral annular calcification. Mild to moderate mitral valve regurgitation. No evidence of mitral valve stenosis. MV peak gradient, 129.0 mmHg. The mean mitral valve gradient is 88.0 mmHg. Tricuspid Valve: The tricuspid valve is abnormal. Tricuspid valve regurgitation is mild . No evidence of tricuspid stenosis. Aortic Valve: The aortic valve  has an indeterminant number of cusps. Aortic valve regurgitation is mild. Aortic regurgitation PHT measures 600 msec. No aortic stenosis is present. Aortic valve mean gradient measures 9.3 mmHg. Aortic valve peak gradient measures 20.0 mmHg. Aortic valve area, by VTI measures 1.54 cm. Pulmonic Valve: The pulmonic valve was not well visualized. Pulmonic valve regurgitation is trivial. No evidence of pulmonic stenosis. Aorta: The aortic root is normal in size and structure. IAS/Shunts: The interatrial septum was not well visualized.  LEFT VENTRICLE PLAX 2D LVIDd:         4.50 cm     Diastology LVIDs:         3.40 cm     LV e' medial:    5.55 cm/s LV PW:         1.10 cm     LV E/e' medial:  18.9 LV IVS:        1.00 cm     LV e' lateral:   5.87 cm/s LVOT diam:     1.90 cm     LV E/e' lateral: 17.9 LV SV:         58 LV SV Index:   39 LVOT Area:     2.84 cm  LV Volumes (MOD) LV vol d, MOD A2C: 42.5 ml LV vol d, MOD A4C: 49.0 ml LV vol s, MOD A2C: 24.4 ml LV vol s, MOD A4C: 28.0 ml LV SV MOD A2C:     18.1 ml LV SV MOD A4C:     49.0 ml LV SV MOD BP:      19.2 ml RIGHT VENTRICLE RV S prime:     12.40 cm/s RVOT diam:      2.00 cm TAPSE  (M-mode): 1.9 cm LEFT ATRIUM             Index        RIGHT ATRIUM           Index LA diam:        3.70 cm 2.45 cm/m   RA Area:     11.50 cm LA Vol (A2C):   49.9 ml 33.07 ml/m  RA Volume:   24.70 ml  16.37 ml/m LA Vol (A4C):   35.5 ml 23.53 ml/m LA Biplane Vol: 44.4 ml 29.42 ml/m  AORTIC VALVE                     PULMONIC VALVE AV Area (Vmax):    1.16 cm      PV Vmax:       0.58 m/s AV Area (Vmean):   1.19 cm      PV Vmean:      37.800 cm/s AV Area (VTI):     1.54 cm      PV VTI:        0.125 m AV Vmax:           223.69 cm/s   PV Peak grad:  1.3 mmHg AV Vmean:          147.413 cm/s  PV Mean grad:  1.0 mmHg AV VTI:            0.379 m AV Peak Grad:      20.0 mmHg AV Mean Grad:      9.3 mmHg LVOT Vmax:         91.60 cm/s LVOT Vmean:        61.800 cm/s LVOT VTI:          0.206 m LVOT/AV VTI ratio:  0.84 AI PHT:            600 msec  AORTA Ao Root diam: 2.50 cm Ao Asc diam:  2.50 cm MITRAL VALVE                TRICUSPID VALVE MV Area (PHT): 1.63 cm     TV Peak grad:   32.5 mmHg MV Peak grad:  129.0 mmHg   TV Mean grad:   24.0 mmHg MV Mean grad:  88.0 mmHg    TV Vmax:        2.85 m/s MV Vmax:       5.68 m/s     TV Vmean:       232.0 cm/s MV Vmean:      448.0 cm/s   TV VTI:         0.93 msec MV Decel Time: 465 msec MV E velocity: 105.00 cm/s  SHUNTS MV A velocity: 111.00 cm/s  Systemic VTI:  0.21 m MV E/A ratio:  0.95         Systemic Diam: 1.90 cm                             Pulmonic Diam: 2.00 cm Carlyle Dolly MD Electronically signed by Carlyle Dolly MD Signature Date/Time: 06/30/2021/12:02:35 PM    Final     Anti-infectives: Anti-infectives (From admission, onward)    Start     Dose/Rate Route Frequency Ordered Stop   06/28/21 2200  piperacillin-tazobactam (ZOSYN) IVPB 3.375 g        3.375 g 12.5 mL/hr over 240 Minutes Intravenous Every 8 hours 06/28/21 2120 07/04/21 1032        Assessment/Plan  Intussusception of cecum  S/P laparoscopic assisted partial R colectomy, end ileostomy  06/29/21 POD#1, AFVSS  - WBC 17, stable  - ileostomy working, monitor output to avoid dehydration/electrolyte disturbance.  - PT/OT - noted palliative consult note, full code, patients goal is to go home with HH   FEN: carb mod, IVF per primary team ID: Zosyn, plan to stop after POD#5 VTE: SCD's, hep gtt for ACS  Foley: in place, removal per primary team  Dispo: ICU   Cardiac arrest, inferior STEMI  Metastatic breast cancer  PMH GIB HTN Acute in chronic anemia     LOS: 3 days   I reviewed nursing notes, hospitalist notes, last 24 h vitals and pain scores, last 48 h intake and output, last 24 h labs and trends, and last 24 h imaging results.  This care required moderate level of medical decision making.   Obie Dredge, PA-C [facility] Please see Amion for pager number during day hours 7:00am-4:30pm

## 2021-07-01 NOTE — Progress Notes (Signed)
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  Intent: Palliative  Radiation Treatment Dates: 05/22/2021 through 06/04/2021 Site Technique Total Dose (Gy) Dose per Fx (Gy) Completed Fx Beam Energies  Thoracic Spine: [id number]_[id number] 3D 30/30 3 10/10 15X   Narrative: The patient tolerated radiation therapy relatively well. She developed fatigue and continued to have some pain in her spine toward the end of therapy. She is currently hospitalized after undergoing abdominal surgery and is also recovering from a heart attack.    Impression/Plan: 1. 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 The patient has been doing well since completion of radiotherapy despite her current situation. We discussed that we would be happy to continue to follow her as needed, but she will also continue to follow up with Dr. Burr Medico in medical oncology and Dr. Marcello Moores in colorectal surgery. After surgery she may go to [facility] for a second opinion.    Carola Rhine, PAC

## 2021-07-01 NOTE — Progress Notes (Addendum)
Robin Estrada   DOB:11/09/1945   ON#:[id number]   KGM#:[id number]  Oncology follow up   Subjective: Events from this weekend have been noted.  Underwent laparoscopic right colectomy with end ileostomy on 07/25/21.  Coded in PACU with diagnosis of STEMI.  Having some abdominal discomfort this morning with movement.  No other new complaints.  Objective:  Vitals:   07/01/21 0700 07/01/21 0742  BP: 122/66   Pulse: 93   Resp: (!) 22   Temp:  97.9 F (36.6 C)  SpO2: 98%     Body mass index is 24.58 kg/m.  Intake/Output Summary (Last 24 hours) at 07/01/2021 0936 Last data filed at 07/01/2021 0530 Gross per 24 hour  Intake 1798.54 ml  Output 1101 ml  Net 697.54 ml     Sclerae unicteric Abdomen soft, with some tenderness to palpation, ileostomy in place  No leg edema   Awake and alert  CBG (last 3)  No results for input(s): GLUCAP in the last 72 hours.   Labs:  Urine Studies No results for input(s): UHGB, CRYS in the last 72 hours.  Invalid input(s): UACOL, UAPR, USPG, UPH, UTP, UGL, [place], UBIL, UNIT, UROB, [place], UEPI, UWBC, Junie Panning [place], [place], [place]  Basic Metabolic Panel: Recent Labs  Lab 06/28/21 1644 06/28/21 1758 2021/07/25 0623 Jul 25, 2021 1016 25-Jul-2021 1340 06/30/21 0320 07/01/21 0245  NA 130* 132* 133* 134* 132* 134* 133*  K 3.8 3.4* 3.2* 2.7* 3.1* 3.2* 3.3*  CL 95* 94* 100 99  --  101 107  CO2 23  --  23  --   --  22 22  GLUCOSE 109* 107* 95 92  --  161* 120*  BUN 8 8 6* <3*  --  6* 8  CREATININE 0.69 0.50 0.51 0.30*  --  0.52 0.55  CALCIUM 8.4*  --  7.8*  --   --  7.7* 7.2*  MG  --   --  1.8  --   --  1.6* 2.2   GFR Estimated Creatinine Clearance: 47.3 mL/min (by C-G formula based on SCr of 0.55 mg/dL). Liver Function Tests: Recent Labs  Lab 06/28/21 1644 07-25-21 0623  AST 24 17  ALT 8 9  ALKPHOS 165* 132*  BILITOT 1.9* 0.7  PROT 6.1* 5.3*  ALBUMIN 2.5* 2.1*   Recent Labs  Lab 06/28/21 1644  LIPASE 27   No results for input(s): AMMONIA in  the last 168 hours. Coagulation profile Recent Labs  Lab 06/30/21 1443  INR 1.3*    CBC: Recent Labs  Lab 06/28/21 1644 06/28/21 1758 2021-07-25 0623 2021-07-25 1016 07-25-2021 1340 06/30/21 0320 07/01/21 0245  WBC 21.4*  --  18.6*  --   --  17.6* 17.5*  NEUTROABS 19.6*  --  16.9*  --   --   --   --   HGB 9.3*   < > 8.1* 8.8* 9.2* 8.5* 8.4*  HCT 30.8*   < > 27.0* 26.0* 27.0* 28.0* 28.1*  MCV 82.4  --  83.9  --   --  83.6 84.6  PLT 383  --  333  --   --  302 298   < > = values in this interval not displayed.   Cardiac Enzymes: No results for input(s): CKTOTAL, CKMB, CKMBINDEX, TROPONINI in the last 168 hours. BNP: Invalid input(s): POCBNP CBG: No results for input(s): GLUCAP in the last 168 hours. D-Dimer No results for input(s): DDIMER in the last 72 hours. Hgb A1c Recent Labs    06/30/21 0320  HGBA1C 5.3   Lipid Profile Recent Labs    06/30/21 0320  CHOL 142  HDL 30*  LDLCALC 75  TRIG 183*  CHOLHDL 4.7   Thyroid function studies No results for input(s): TSH, T4TOTAL, T3FREE, THYROIDAB in the last 72 hours.  Invalid input(s): FREET3 Anemia work up Recent Labs    06/28/21 1658  TIBC 132*  IRON 47   Microbiology Recent Results (from the past 240 hour(s))  Resp Panel by RT-PCR (Flu A&B, Covid) Nasopharyngeal Swab     Status: None   Collection Time: 06/28/21  8:00 PM   Specimen: Nasopharyngeal Swab; Nasopharyngeal(NP) swabs in vial transport medium  Result Value Ref Range Status   SARS Coronavirus 2 by RT PCR NEGATIVE NEGATIVE Final    Comment: (NOTE) SARS-CoV-2 target nucleic acids are NOT DETECTED.  The SARS-CoV-2 RNA is generally detectable in upper respiratory specimens during the acute phase of infection. The lowest concentration of SARS-CoV-2 viral copies this assay can detect is 138 copies/mL. A negative result does not preclude SARS-Cov-2 infection and should not be used as the sole basis for treatment or other patient management decisions. A  negative result may occur with  improper specimen collection/handling, submission of specimen other than nasopharyngeal swab, presence of viral mutation(s) within the areas targeted by this assay, and inadequate number of viral copies(<138 copies/mL). A negative result must be combined with clinical observations, patient history, and epidemiological information. The expected result is Negative.  Fact Sheet for Patients:  EntrepreneurPulse.com.au  Fact Sheet for Healthcare Providers:  IncredibleEmployment.be  This test is no t yet approved or cleared by the [place] FDA and  has been authorized for detection and/or diagnosis of SARS-CoV-2 by FDA under an Emergency Use Authorization (EUA). This EUA will remain  in effect (meaning this test can be used) for the duration of the COVID-19 declaration under Section 564(b)(1) of the Act, 21 U.S.C.section 360bbb-3(b)(1), unless the authorization is terminated  or revoked sooner.       Influenza A by PCR NEGATIVE NEGATIVE Final   Influenza B by PCR NEGATIVE NEGATIVE Final    Comment: (NOTE) The Xpert Xpress SARS-CoV-2/FLU/RSV plus assay is intended as an aid in the diagnosis of influenza from Nasopharyngeal swab specimens and should not be used as a sole basis for treatment. Nasal washings and aspirates are unacceptable for Xpert Xpress SARS-CoV-2/FLU/RSV testing.  Fact Sheet for Patients: EntrepreneurPulse.com.au  Fact Sheet for Healthcare Providers: IncredibleEmployment.be  This test is not yet approved or cleared by the [place] FDA and has been authorized for detection and/or diagnosis of SARS-CoV-2 by FDA under an Emergency Use Authorization (EUA). This EUA will remain in effect (meaning this test can be used) for the duration of the COVID-19 declaration under Section 564(b)(1) of the Act, 21 U.S.C. section 360bbb-3(b)(1), unless the authorization  is terminated or revoked.  Performed at [facility], [place] [address], [place], [place] [address]       Studies:  DG Chest Port 1 View  Result Date: 06/29/2021 CLINICAL DATA:  Status post code blue, concern for pneumothorax EXAM: PORTABLE CHEST 1 VIEW COMPARISON:  06/29/2021 FINDINGS: Right neck vascular catheter. The heart size and mediastinal contours are within normal limits. Lobulated mass projects over the right midlung. No significant pneumothorax. No acute airspace opacity. Small volume pneumoperitoneum underlying the right hemidiaphragm. The visualized skeletal structures are unremarkable. IMPRESSION: 1. No significant pneumothorax or obvious displaced rib fracture in single AP portable view. 2. Small volume pneumoperitoneum underlying the  right hemidiaphragm. These results will be called to the ordering clinician or representative by the Radiologist Assistant, and communication documented in the PACS or [employer]. Electronically Signed   By: Delanna Ahmadi M.D.   On: 06/29/2021 13:47   DG Chest Port 1 View  Result Date: 06/29/2021 CLINICAL DATA:  Central line placement. EXAM: PORTABLE CHEST 1 VIEW COMPARISON:  05/14/2021 FINDINGS: 1212 hours. Right IJ central line tip overlies the distal SVC level near the expected junction with the RA. No evidence for pneumothorax. Right pulmonary lesion again noted. Left lung clear. Telemetry leads overlie the chest. IMPRESSION: Right IJ central line tip overlies the distal SVC level. No pneumothorax. Electronically Signed   By: Misty [place] M.D.   On: 06/29/2021 12:38   ECHOCARDIOGRAM COMPLETE  Result Date: 06/30/2021    ECHOCARDIOGRAM REPORT   Patient Name:   Robin Estrada Date of Exam: 06/30/2021 Medical Rec #:  [id number]      Height:       60.0 in Accession #:    [id number]     Weight:       121.3 lb Date of Birth:  [birth year]      BSA:          1.509 m Patient Age:    23 years       BP:           130/48 mmHg Patient  Gender: F              HR:           79 bpm. Exam Location:  Inpatient Procedure: 2D Echo, Cardiac Doppler and Color Doppler Indications:    R07.9 CHEST PAIN  History:        Patient has prior history of Echocardiogram examinations, most                 recent 07/21/2017. Signs/Symptoms:Murmur; Risk                 Factors:Hypertension. MVP.  Sonographer:    Beryle Beams Referring Phys: [id number] Alphonse Guild BRANCH  Sonographer Comments: No subcostal window. POST OP ABDOMEN IMPRESSIONS  1. Mild inferoseptal hypokinesis. Left ventricular ejection fraction, by estimation, is 50%. The left ventricle has low normal function. The left ventricle demonstrates regional wall motion abnormalities (see scoring diagram/findings for description). There is mild left ventricular hypertrophy. Left ventricular diastolic parameters are indeterminate. Elevated left atrial pressure.  2. Right ventricular systolic function is normal. The right ventricular size is normal. Tricuspid regurgitation signal is inadequate for assessing PA pressure.  3. The mitral valve is abnormal. Mild to moderate mitral valve regurgitation. No evidence of mitral stenosis.  4. The tricuspid valve is abnormal.  5. The aortic valve has an indeterminant number of cusps. Aortic valve regurgitation is mild. No aortic stenosis is present. FINDINGS  Left Ventricle: Mild inferoseptal hypokinesis. Left ventricular ejection fraction, by estimation, is 50%. The left ventricle has low normal function. The left ventricle demonstrates regional wall motion abnormalities. The left ventricular internal cavity size was normal in size. There is mild left ventricular hypertrophy. Left ventricular diastolic parameters are indeterminate. Elevated left atrial pressure. Right Ventricle: The right ventricular size is normal. No increase in right ventricular wall thickness. Right ventricular systolic function is normal. Tricuspid regurgitation signal is inadequate for assessing PA  pressure. Left Atrium: Left atrial size was normal in size. Right Atrium: Right atrial size was normal in size. Pericardium: The pericardium was not well visualized. Mitral Valve: The  mitral valve is abnormal. There is mild thickening of the mitral valve leaflet(s). There is mild calcification of the mitral valve leaflet(s). Mild mitral annular calcification. Mild to moderate mitral valve regurgitation. No evidence of mitral valve stenosis. MV peak gradient, 129.0 mmHg. The mean mitral valve gradient is 88.0 mmHg. Tricuspid Valve: The tricuspid valve is abnormal. Tricuspid valve regurgitation is mild . No evidence of tricuspid stenosis. Aortic Valve: The aortic valve has an indeterminant number of cusps. Aortic valve regurgitation is mild. Aortic regurgitation PHT measures 600 msec. No aortic stenosis is present. Aortic valve mean gradient measures 9.3 mmHg. Aortic valve peak gradient measures 20.0 mmHg. Aortic valve area, by VTI measures 1.54 cm. Pulmonic Valve: The pulmonic valve was not well visualized. Pulmonic valve regurgitation is trivial. No evidence of pulmonic stenosis. Aorta: The aortic root is normal in size and structure. IAS/Shunts: The interatrial septum was not well visualized.  LEFT VENTRICLE PLAX 2D LVIDd:         4.50 cm     Diastology LVIDs:         3.40 cm     LV e' medial:    5.55 cm/s LV PW:         1.10 cm     LV E/e' medial:  18.9 LV IVS:        1.00 cm     LV e' lateral:   5.87 cm/s LVOT diam:     1.90 cm     LV E/e' lateral: 17.9 LV SV:         58 LV SV Index:   39 LVOT Area:     2.84 cm  LV Volumes (MOD) LV vol d, MOD A2C: 42.5 ml LV vol d, MOD A4C: 49.0 ml LV vol s, MOD A2C: 24.4 ml LV vol s, MOD A4C: 28.0 ml LV SV MOD A2C:     18.1 ml LV SV MOD A4C:     49.0 ml LV SV MOD BP:      19.2 ml RIGHT VENTRICLE RV S prime:     12.40 cm/s RVOT diam:      2.00 cm TAPSE (M-mode): 1.9 cm LEFT ATRIUM             Index        RIGHT ATRIUM           Index LA diam:        3.70 cm 2.45 cm/m   RA  Area:     11.50 cm LA Vol (A2C):   49.9 ml 33.07 ml/m  RA Volume:   24.70 ml  16.37 ml/m LA Vol (A4C):   35.5 ml 23.53 ml/m LA Biplane Vol: 44.4 ml 29.42 ml/m  AORTIC VALVE                     PULMONIC VALVE AV Area (Vmax):    1.16 cm      PV Vmax:       0.58 m/s AV Area (Vmean):   1.19 cm      PV Vmean:      37.800 cm/s AV Area (VTI):     1.54 cm      PV VTI:        0.125 m AV Vmax:           223.69 cm/s   PV Peak grad:  1.3 mmHg AV Vmean:          147.413 cm/s  PV Mean grad:  1.0 mmHg  AV VTI:            0.379 m AV Peak Grad:      20.0 mmHg AV Mean Grad:      9.3 mmHg LVOT Vmax:         91.60 cm/s LVOT Vmean:        61.800 cm/s LVOT VTI:          0.206 m LVOT/AV VTI ratio: 0.54 AI PHT:            600 msec  AORTA Ao Root diam: 2.50 cm Ao Asc diam:  2.50 cm MITRAL VALVE                TRICUSPID VALVE MV Area (PHT): 1.63 cm     TV Peak grad:   32.5 mmHg MV Peak grad:  129.0 mmHg   TV Mean grad:   24.0 mmHg MV Mean grad:  88.0 mmHg    TV Vmax:        2.85 m/s MV Vmax:       5.68 m/s     TV Vmean:       232.0 cm/s MV Vmean:      448.0 cm/s   TV VTI:         0.93 msec MV Decel Time: 465 msec MV E velocity: 105.00 cm/s  SHUNTS MV A velocity: 111.00 cm/s  Systemic VTI:  0.21 m MV E/A ratio:  0.95         Systemic Diam: 1.90 cm                             Pulmonic Diam: 2.00 cm Carlyle Dolly MD Electronically signed by Carlyle Dolly MD Signature Date/Time: 06/30/2021/12:02:35 PM    Final     Assessment: 76 y.o. 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 Anemia secondary to iron deficiency and chronic disease Gastric ulcer Cirrhosis of the liver Emphysema Cystic mass of the pancreas Mild hyperbilirubinemia   Plan:  -Her pathology report from [facility] has been  discussed with her and she was given a copy of this.  Biopsy from the colon and lung were all consistent with metastatic disease from her previous breast cancer (malignant phyllodes tumor), triple negative. -We have discussed systemic treatment options with her which would mainly be chemotherapy.  She has been reluctant to commit to treatment and we have recommended a second opinion at [facility] which she has agreed to. -She benefited from her prior radiation to her T11 met.  The patient has expressed interest in kyphoplasty and we can ask IR to see her when she recovers better from her surgery. -Patient is more interested in targeted therapy, [facility] has been ordered on her biopsy, results still pending. -I will set up her appointment with our palliative care clinic in our office -Patient would benefit from home physical therapy -Recovering slowly from surgery.  Bowel obstruction does not seem to be related to her malignancy but will follow-up on surgical path results. -I will see her back in a few weeks when her FO result returns.   Mikey Bussing, NP 07/01/2021  9:36 AM  Addendum  I have seen the patient, examined her. I agree with the assessment and and plan and have  edited the notes.   I spoke with pt in late afternoon through online interpreter, she did not want me to call her son. Due to her recent urgent surgery and STEMI, she will not be a candidate for chemotherapy for a while, she is not knee about chemo anyway. For her pain control, she is not interested in narcotics, will let primary and palliative teams decide if let her try Cymbalta. I will reach out to IR if she is a candidate for kyphoplasty. She does not want to be sedated by pain meds. I will f/u her as needed.   Truitt Merle  07/01/2021

## 2021-07-01 NOTE — Progress Notes (Addendum)
PROGRESS NOTE    Robin Estrada  VQQ:[id number] DOB: 1946/01/09 DOA: 06/28/2021 PCP: [name], MD   Brief Narrative:  76 year old [name] with a history of lung cancer, history of breast cancer, new vertebral masses presents the ER today with a several day history of worsening abdominal pain, nausea. CT abdomen with IV contrast showed large intussusception of the cecum and distal ileum within the length of the ascending colon and subsequent dilatation of the ascending colon. There are also noted various and numerous lytic lesions throughout the thoracolumbar spine and pelvis consistent with metastatic disease. General surgery was consulted.  Patient went to the OR on 2/18.  She had a post-op cardiac arrest and inferior STEMI-- not a candidate for cardiac intervention.      Assessment & Plan:  Cecal and distal ileum intussusception with bowel obstruction: -Patient presented with severe abdominal pain -S/p laparoscopic right colectomy with end ileostomy 2.18 -Leukocytosis improving -Continue IV fluids and IV Zosyn -Appreciate general surgery help  Postop cardiac arrest Inferior ST EMI Ischemic cardiomyopathy -Troponin trended up.  Patient is not a good candidate for cardiac cath -Transthoracic echo shows ejection fraction of 50%, the left ventricle demonstrated regional wall motion abnormalities.  Mild LVH,  diastolic parameters indeterminate. -Continue aspirin.  Okay to start full anticoagulation as per general surgery.  Patient started on full dose heparin -Started on Imdur.  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   -  Patient underwent CT-guided lung biopsy on 05/14/2021-results consistent of poorly differentiated malignancy with sarcomatoid and epithelioid features.    -Continue as needed pain medications  Hypokalemia:  -replete -Mg repleted as well  Hypomagnesemia: replenished  History of GI bleed: -cont. Protonix   Hypertension: Stable -Imdur. Monitor for now.  -cards to add low-dose beta-blocker if she continues to improve.  Hyponatremia -mild -unclear etiology -trend  Acute blood loss anemia -H&H dropped from 10.9-8.5.  Status post 1 unit blood transfusion on 2/19. -Continue to monitor H&H closely.  Transfuse if hemoglobin is less than 8  Aortic atherosclerosis: Noted on recent CT  Cystic mass of pancreas: -Previous CA 19-9 level normal. -Follow-up outpatient  Foley in place?  From code/surgery-- will plan to d/c  Poor overall prognosis, palliative care has seen  DVT prophylaxis: heparin Code Status: Full code Family Communication:  None present at bedside Disposition Plan: TBD- poor overall prognosis  Consultants:  General surgery PCCM Palliative care Cardiology  Procedures:  Laparoscopic left right colectomy with end ileostomy      Subjective: C/o bladder pain  Objective: Vitals:   07/01/21 0630 07/01/21 0645 07/01/21 0700 07/01/21 0742  BP: (!) 120/55 122/60 122/66   Pulse: 93 95 93   Resp: (!) 21 (!) 25 (!) 22   Temp:    97.9 F (36.6 C)  TempSrc:    Oral  SpO2: 97% 98% 98%   Weight:        Intake/Output Summary (Last 24 hours) at 07/01/2021 1204 Last data filed at 07/01/2021 1127 Gross per 24 hour  Intake 2664.03 ml  Output 1068 ml  Net 1596.03 ml   Filed Weights   06/30/21 0455 07/01/21 0414  Weight: 55 kg 57.1 kg    Examination:   General: Appearance:    elderly female in no acute distress     Lungs:  respirations unlabored  Heart:    Normal heart rate.     MS:   All extremities are intact.    Neurologic:   Awake, alert- irritable- does not want to talk      Data Reviewed: I have personally reviewed following labs and imaging studies  CBC: Recent Labs  Lab 06/28/21 1644  06/28/21 1758 06/29/21 0623 06/29/21 1016 06/29/21 1340 06/30/21 0320 07/01/21 0245  WBC 21.4*  --  18.6*  --   --  17.6* 17.5*  NEUTROABS 19.6*  --  16.9*  --   --   --   --   HGB 9.3*   < > 8.1* 8.8* 9.2* 8.5* 8.4*  HCT 30.8*   < > 27.0* 26.0* 27.0* 28.0* 28.1*  MCV 82.4  --  83.9  --   --  83.6 84.6  PLT 383  --  333  --   --  302 298   < > = values in this interval not displayed.   Basic Metabolic Panel: Recent Labs  Lab 06/28/21 1644 06/28/21 1758 06/29/21 0623 06/29/21 1016 06/29/21 1340 06/30/21 0320 07/01/21 0245  NA 130* 132* 133* 134* 132* 134* 133*  K 3.8 3.4* 3.2* 2.7* 3.1* 3.2* 3.3*  CL 95* 94* 100 99  --  101 107  CO2 23  --  23  --   --  22 22  GLUCOSE 109* 107* 95 92  --  161* 120*  BUN 8 8 6* <3*  --  6* 8  CREATININE 0.69 0.50 0.51 0.30*  --  0.52 0.55  CALCIUM 8.4*  --  7.8*  --   --  7.7* 7.2*  MG  --   --  1.8  --   --  1.6* 2.2   GFR: Estimated Creatinine Clearance: 47.3 mL/min (by C-G formula based on SCr of 0.55 mg/dL). Liver Function Tests: Recent Labs  Lab 06/28/21 1644 06/29/21 0623  AST 24 17  ALT 8 9  ALKPHOS 165* 132*  BILITOT 1.9* 0.7  PROT 6.1* 5.3*  ALBUMIN 2.5* 2.1*   Recent Labs  Lab 06/28/21 1644  LIPASE 27   No results for input(s): AMMONIA in the last 168 hours. Coagulation Profile: Recent Labs  Lab 06/30/21 1443  INR 1.3*   Cardiac Enzymes: No results for input(s): CKTOTAL, CKMB, CKMBINDEX, TROPONINI in the last 168 hours. BNP (last 3 results) No results for input(s): PROBNP in the last 8760 hours. HbA1C: Recent Labs    06/30/21 0320  HGBA1C 5.3   CBG: No results for input(s): GLUCAP in the last 168 hours. Lipid Profile: Recent Labs    06/30/21 0320  CHOL 142  HDL 30*  LDLCALC 75  TRIG 183*  CHOLHDL 4.7   Thyroid Function Tests: No results for input(s): TSH, T4TOTAL, FREET4, T3FREE, THYROIDAB in the last 72 hours. Anemia Panel: Recent Labs    06/28/21 1658  TIBC 132*  IRON 47   Sepsis  Labs: Recent Labs  Lab 06/28/21 2028 06/29/21 9381  LATICACIDVEN 1.2 0.8    Recent Results (from the past 240 hour(s))  Resp Panel by RT-PCR (Flu A&B, Covid) Nasopharyngeal Swab     Status: None   Collection Time: 06/28/21  8:00 PM   Specimen: Nasopharyngeal Swab; Nasopharyngeal(NP) swabs in vial transport medium  Result Value Ref Range Status   SARS Coronavirus 2 by RT PCR NEGATIVE NEGATIVE Final    Comment: (NOTE) SARS-CoV-2 target nucleic acids are NOT DETECTED.  The SARS-CoV-2 RNA is generally detectable  in upper respiratory specimens during the acute phase of infection. The lowest concentration of SARS-CoV-2 viral copies this assay can detect is 138 copies/mL. A negative result does not preclude SARS-Cov-2 infection and should not be used as the sole basis for treatment or other patient management decisions. A negative result may occur with  improper specimen collection/handling, submission of specimen other than nasopharyngeal swab, presence of viral mutation(s) within the areas targeted by this assay, and inadequate number of viral copies(<138 copies/mL). A negative result must be combined with clinical observations, patient history, and epidemiological information. The expected result is Negative.  Fact Sheet for Patients:  EntrepreneurPulse.com.au  Fact Sheet for Healthcare Providers:  IncredibleEmployment.be  This test is no t yet approved or cleared by the [place] FDA and  has been authorized for detection and/or diagnosis of SARS-CoV-2 by FDA under an Emergency Use Authorization (EUA). This EUA will remain  in effect (meaning this test can be used) for the duration of the COVID-19 declaration under Section 564(b)(1) of the Act, 21 U.S.C.section 360bbb-3(b)(1), unless the authorization is terminated  or revoked sooner.       Influenza A by PCR NEGATIVE NEGATIVE Final   Influenza B by PCR NEGATIVE NEGATIVE Final     Comment: (NOTE) The Xpert Xpress SARS-CoV-2/FLU/RSV plus assay is intended as an aid in the diagnosis of influenza from Nasopharyngeal swab specimens and should not be used as a sole basis for treatment. Nasal washings and aspirates are unacceptable for Xpert Xpress SARS-CoV-2/FLU/RSV testing.  Fact Sheet for Patients: EntrepreneurPulse.com.au  Fact Sheet for Healthcare Providers: IncredibleEmployment.be  This test is not yet approved or cleared by the [place] FDA and has been authorized for detection and/or diagnosis of SARS-CoV-2 by FDA under an Emergency Use Authorization (EUA). This EUA will remain in effect (meaning this test can be used) for the duration of the COVID-19 declaration under Section 564(b)(1) of the Act, 21 U.S.C. section 360bbb-3(b)(1), unless the authorization is terminated or revoked.  Performed at [facility], [place] [address], [place], [place] [address]       Radiology Studies: [facility] Chest Port 1 View  Result Date: 06/29/2021 CLINICAL DATA:  Status post code blue, concern for pneumothorax EXAM: PORTABLE CHEST 1 VIEW COMPARISON:  06/29/2021 FINDINGS: Right neck vascular catheter. The heart size and mediastinal contours are within normal limits. Lobulated mass projects over the right midlung. No significant pneumothorax. No acute airspace opacity. Small volume pneumoperitoneum underlying the right hemidiaphragm. The visualized skeletal structures are unremarkable. IMPRESSION: 1. No significant pneumothorax or obvious displaced rib fracture in single AP portable view. 2. Small volume pneumoperitoneum underlying the right hemidiaphragm. These results will be called to the ordering clinician or representative by the Radiologist Assistant, and communication documented in the PACS or [employer]. Electronically Signed   By: Delanna Ahmadi M.D.   On: 06/29/2021 13:47   DG Chest Port 1 View  Result Date:  06/29/2021 CLINICAL DATA:  Central line placement. EXAM: PORTABLE CHEST 1 VIEW COMPARISON:  05/14/2021 FINDINGS: 1212 hours. Right IJ central line tip overlies the distal SVC level near the expected junction with the RA. No evidence for pneumothorax. Right pulmonary lesion again noted. Left lung clear. Telemetry leads overlie the chest. IMPRESSION: Right IJ central line tip overlies the distal SVC level. No pneumothorax. Electronically Signed   By: Misty Stanley M.D.   On: 06/29/2021 12:38   ECHOCARDIOGRAM COMPLETE  Result Date: 06/30/2021    ECHOCARDIOGRAM REPORT   Patient Name:  Robin Estrada Date of Exam: 06/30/2021 Medical Rec #:  [id number]      Height:       60.0 in Accession #:    [id number]     Weight:       121.3 lb Date of Birth:  11/23/1945      BSA:          1.509 m Patient Age:    66 years       BP:           130/48 mmHg Patient Gender: F              HR:           79 bpm. Exam Location:  Inpatient Procedure: 2D Echo, Cardiac Doppler and Color Doppler Indications:    R07.9 CHEST PAIN  History:        Patient has prior history of Echocardiogram examinations, most                 recent 07/21/2017. Signs/Symptoms:Murmur; Risk                 Factors:Hypertension. MVP.  Sonographer:    Beryle Beams Referring Phys: [id number] Alphonse Guild BRANCH  Sonographer Comments: No subcostal window. POST OP ABDOMEN IMPRESSIONS  1. Mild inferoseptal hypokinesis. Left ventricular ejection fraction, by estimation, is 50%. The left ventricle has low normal function. The left ventricle demonstrates regional wall motion abnormalities (see scoring diagram/findings for description). There is mild left ventricular hypertrophy. Left ventricular diastolic parameters are indeterminate. Elevated left atrial pressure.  2. Right ventricular systolic function is normal. The right ventricular size is normal. Tricuspid regurgitation signal is inadequate for assessing PA pressure.  3. The mitral valve is abnormal. Mild to moderate mitral  valve regurgitation. No evidence of mitral stenosis.  4. The tricuspid valve is abnormal.  5. The aortic valve has an indeterminant number of cusps. Aortic valve regurgitation is mild. No aortic stenosis is present. FINDINGS  Left Ventricle: Mild inferoseptal hypokinesis. Left ventricular ejection fraction, by estimation, is 50%. The left ventricle has low normal function. The left ventricle demonstrates regional wall motion abnormalities. The left ventricular internal cavity size was normal in size. There is mild left ventricular hypertrophy. Left ventricular diastolic parameters are indeterminate. Elevated left atrial pressure. Right Ventricle: The right ventricular size is normal. No increase in right ventricular wall thickness. Right ventricular systolic function is normal. Tricuspid regurgitation signal is inadequate for assessing PA pressure. Left Atrium: Left atrial size was normal in size. Right Atrium: Right atrial size was normal in size. Pericardium: The pericardium was not well visualized. Mitral Valve: The mitral valve is abnormal. There is mild thickening of the mitral valve leaflet(s). There is mild calcification of the mitral valve leaflet(s). Mild mitral annular calcification. Mild to moderate mitral valve regurgitation. No evidence of mitral valve stenosis. MV peak gradient, 129.0 mmHg. The mean mitral valve gradient is 88.0 mmHg. Tricuspid Valve: The tricuspid valve is abnormal. Tricuspid valve regurgitation is mild . No evidence of tricuspid stenosis. Aortic Valve: The aortic valve has an indeterminant number of cusps. Aortic valve regurgitation is mild. Aortic regurgitation PHT measures 600 msec. No aortic stenosis is present. Aortic valve mean gradient measures 9.3 mmHg. Aortic valve peak gradient measures 20.0 mmHg. Aortic valve area, by VTI measures 1.54 cm. Pulmonic Valve: The pulmonic valve was not well visualized. Pulmonic valve regurgitation is trivial. No evidence of pulmonic stenosis.  Aorta: The aortic root is normal in size and structure. IAS/Shunts:  The interatrial septum was not well visualized.  LEFT VENTRICLE PLAX 2D LVIDd:         4.50 cm     Diastology LVIDs:         3.40 cm     LV e' medial:    5.55 cm/s LV PW:         1.10 cm     LV E/e' medial:  18.9 LV IVS:        1.00 cm     LV e' lateral:   5.87 cm/s LVOT diam:     1.90 cm     LV E/e' lateral: 17.9 LV SV:         58 LV SV Index:   39 LVOT Area:     2.84 cm  LV Volumes (MOD) LV vol d, MOD A2C: 42.5 ml LV vol d, MOD A4C: 49.0 ml LV vol s, MOD A2C: 24.4 ml LV vol s, MOD A4C: 28.0 ml LV SV MOD A2C:     18.1 ml LV SV MOD A4C:     49.0 ml LV SV MOD BP:      19.2 ml RIGHT VENTRICLE RV S prime:     12.40 cm/s RVOT diam:      2.00 cm TAPSE (M-mode): 1.9 cm LEFT ATRIUM             Index        RIGHT ATRIUM           Index LA diam:        3.70 cm 2.45 cm/m   RA Area:     11.50 cm LA Vol (A2C):   49.9 ml 33.07 ml/m  RA Volume:   24.70 ml  16.37 ml/m LA Vol (A4C):   35.5 ml 23.53 ml/m LA Biplane Vol: 44.4 ml 29.42 ml/m  AORTIC VALVE                     PULMONIC VALVE AV Area (Vmax):    1.16 cm      PV Vmax:       0.58 m/s AV Area (Vmean):   1.19 cm      PV Vmean:      37.800 cm/s AV Area (VTI):     1.54 cm      PV VTI:        0.125 m AV Vmax:           223.69 cm/s   PV Peak grad:  1.3 mmHg AV Vmean:          147.413 cm/s  PV Mean grad:  1.0 mmHg AV VTI:            0.379 m AV Peak Grad:      20.0 mmHg AV Mean Grad:      9.3 mmHg LVOT Vmax:         91.60 cm/s LVOT Vmean:        61.800 cm/s LVOT VTI:          0.206 m LVOT/AV VTI ratio: 0.54 AI PHT:            600 msec  AORTA Ao Root diam: 2.50 cm Ao Asc diam:  2.50 cm MITRAL VALVE                TRICUSPID VALVE MV Area (PHT): 1.63 cm     TV Peak grad:   32.5 mmHg MV Peak grad:  129.0 mmHg   TV Mean grad:  24.0 mmHg MV Mean grad:  88.0 mmHg    TV Vmax:        2.85 m/s MV Vmax:       5.68 m/s     TV Vmean:       232.0 cm/s MV Vmean:      448.0 cm/s   TV VTI:         0.93 msec MV Decel  Time: 465 msec MV E velocity: 105.00 cm/s  SHUNTS MV A velocity: 111.00 cm/s  Systemic VTI:  0.21 m MV E/A ratio:  0.95         Systemic Diam: 1.90 cm                             Pulmonic Diam: 2.00 cm Carlyle Dolly MD Electronically signed by Carlyle Dolly MD Signature Date/Time: 06/30/2021/12:02:35 PM    Final     Scheduled Meds:  sodium chloride   Intravenous Once   acetaminophen (TYLENOL) oral liquid 160 mg/5 mL  1,000 mg Oral Q6H   aspirin EC  81 mg Oral Daily   carvedilol  3.125 mg Oral BID WC   Chlorhexidine Gluconate Cloth  6 each Topical Daily   dicyclomine  10 mg Oral Once   feeding supplement  237 mL Oral BID BM   isosorbide mononitrate  30 mg Oral Daily   pantoprazole (PROTONIX) IV  40 mg Intravenous Q24H   pravastatin  20 mg Oral q1800   Continuous Infusions:  0.9 % NaCl with KCl 20 mEq / L 100 mL/hr at 07/01/21 1127   heparin 850 Units/hr (07/01/21 1127)   piperacillin-tazobactam (ZOSYN)  IV Stopped (07/01/21 0918)     LOS: 3 days   Time spent: 43 minutes   Geradine Girt, MD [facility]  If 7PM-7AM, please contact night-coverage [email or web address] 07/01/2021, 12:04 PM

## 2021-07-01 NOTE — Progress Notes (Addendum)
Progress Note  Patient Name: Robin Estrada Date of Encounter: 07/01/2021  [facility] Cardiologist: Jenkins Rouge, MD   Subjective   Interview performed with the help of a [place]. Patient denies any chest pain this morning or overnight. Denies SOB, palpitations. Does not have any complaints this AM.   Inpatient Medications    Scheduled Meds:  sodium chloride   Intravenous Once   acetaminophen (TYLENOL) oral liquid 160 mg/5 mL  1,000 mg Oral Q6H   aspirin EC  81 mg Oral Daily   Chlorhexidine Gluconate Cloth  6 each Topical Daily   dicyclomine  10 mg Oral Once   feeding supplement  237 mL Oral BID BM   isosorbide mononitrate  30 mg Oral Daily   pantoprazole (PROTONIX) IV  40 mg Intravenous Q24H   pravastatin  20 mg Oral q1800   Continuous Infusions:  0.9 % NaCl with KCl 20 mEq / L 100 mL/hr at 07/01/21 0517   heparin 700 Units/hr (07/01/21 0200)   nitroGLYCERIN 10 mcg/min (07/01/21 0518)   piperacillin-tazobactam (ZOSYN)  IV 3.375 g (07/01/21 0517)   PRN Meds: alum & mag hydroxide-simeth, dicyclomine, diphenhydrAMINE **OR** diphenhydrAMINE, fentaNYL (SUBLIMAZE) injection, HYDROmorphone (DILAUDID) injection, ondansetron **OR** ondansetron (ZOFRAN) IV, simethicone   Vital Signs    Vitals:   07/01/21 0615 07/01/21 0630 07/01/21 0645 07/01/21 0700  BP: 121/66 (!) 120/55 122/60 122/66  Pulse: 97 93 95 93  Resp: (!) 23 (!) 21 (!) 25 (!) 22  Temp:      TempSrc:      SpO2: 96% 97% 98% 98%  Weight:        Intake/Output Summary (Last 24 hours) at 07/01/2021 0727 Last data filed at 07/01/2021 0530 Gross per 24 hour  Intake 1972.13 ml  Output 1123 ml  Net 849.13 ml   Last 3 Weights 07/01/2021 06/30/2021 06/28/2021  Weight (lbs) 125 lb 14.1 oz 121 lb 4.1 oz 110 lb 9.6 oz  Weight (kg) 57.1 kg 55 kg 50.168 kg      Telemetry    Sinus rhythm, HR in 90s-100s - Personally Reviewed  ECG    No new tracings since 2/18 - Personally Reviewed  Physical Exam    GEN: No acute distress, laying comfortably in the bed Neck: No JVD Cardiac: RRR, no murmurs, rubs, or gallops. Grade 1/6 systolic murmur at apex Respiratory: Anterior lung exam clear to ausculation bilaterally, normal effort and respiratory rate  GI: Soft, non-distended, mildly tender to palpation. Ostomy bag in place  MS: No edema; No deformity. Neuro:  Nonfocal  Psych: Normal affect   Labs    High Sensitivity Troponin:   Recent Labs  Lab 06/29/21 1621 06/29/21 2004 06/30/21 0518  TROPONINIHS 952* 14,681* >24,000*     Chemistry Recent Labs  Lab 06/28/21 1644 06/28/21 1758 06/29/21 0623 06/29/21 1016 06/29/21 1340 06/30/21 0320 07/01/21 0245  NA 130*   < > 133* 134* 132* 134* 133*  K 3.8   < > 3.2* 2.7* 3.1* 3.2* 3.3*  CL 95*   < > 100 99  --  101 107  CO2 23  --  23  --   --  22 22  GLUCOSE 109*   < > 95 92  --  161* 120*  BUN 8   < > 6* <3*  --  6* 8  CREATININE 0.69   < > 0.51 0.30*  --  0.52 0.55  CALCIUM 8.4*  --  7.8*  --   --  7.7*  7.2*  MG  --   --  1.8  --   --  1.6* 2.2  PROT 6.1*  --  5.3*  --   --   --   --   ALBUMIN 2.5*  --  2.1*  --   --   --   --   AST 24  --  17  --   --   --   --   ALT 8  --  9  --   --   --   --   ALKPHOS 165*  --  132*  --   --   --   --   BILITOT 1.9*  --  0.7  --   --   --   --   GFRNONAA >60  --  >60  --   --  >60 >60  ANIONGAP 12  --  10  --   --  11 4*   < > = values in this interval not displayed.    Lipids  Recent Labs  Lab 06/30/21 0320  CHOL 142  TRIG 183*  HDL 30*  LDLCALC 75  CHOLHDL 4.7    Hematology Recent Labs  Lab 06/29/21 0623 06/29/21 1016 06/29/21 1340 06/30/21 0320 07/01/21 0245  WBC 18.6*  --   --  17.6* 17.5*  RBC 3.22*  --   --  3.35* 3.32*  HGB 8.1*   < > 9.2* 8.5* 8.4*  HCT 27.0*   < > 27.0* 28.0* 28.1*  MCV 83.9  --   --  83.6 84.6  MCH 25.2*  --   --  25.4* 25.3*  MCHC 30.0  --   --  30.4 29.9*  RDW 25.7*  --   --  25.2* 25.7*  PLT 333  --   --  302 298   < > = values in this  interval not displayed.   Thyroid No results for input(s): TSH, FREET4 in the last 168 hours.  BNPNo results for input(s): BNP, PROBNP in the last 168 hours.  DDimer No results for input(s): DDIMER in the last 168 hours.   Radiology    DG Chest Port 1 View  Result Date: 06/29/2021 CLINICAL DATA:  Status post code blue, concern for pneumothorax EXAM: PORTABLE CHEST 1 VIEW COMPARISON:  06/29/2021 FINDINGS: Right neck vascular catheter. The heart size and mediastinal contours are within normal limits. Lobulated mass projects over the right midlung. No significant pneumothorax. No acute airspace opacity. Small volume pneumoperitoneum underlying the right hemidiaphragm. The visualized skeletal structures are unremarkable. IMPRESSION: 1. No significant pneumothorax or obvious displaced rib fracture in single AP portable view. 2. Small volume pneumoperitoneum underlying the right hemidiaphragm. These results will be called to the ordering clinician or representative by the Radiologist Assistant, and communication documented in the PACS or [employer]. Electronically Signed   By: Delanna Ahmadi M.D.   On: 06/29/2021 13:47   DG Chest Port 1 View  Result Date: 06/29/2021 CLINICAL DATA:  Central line placement. EXAM: PORTABLE CHEST 1 VIEW COMPARISON:  05/14/2021 FINDINGS: 1212 hours. Right IJ central line tip overlies the distal SVC level near the expected junction with the RA. No evidence for pneumothorax. Right pulmonary lesion again noted. Left lung clear. Telemetry leads overlie the chest. IMPRESSION: Right IJ central line tip overlies the distal SVC level. No pneumothorax. Electronically Signed   By: Misty Stanley M.D.   On: 06/29/2021 12:38   ECHOCARDIOGRAM COMPLETE  Result Date: 06/30/2021  ECHOCARDIOGRAM REPORT   Patient Name:   Robin Estrada Date of Exam: 06/30/2021 Medical Rec #:  [id number]      Height:       60.0 in Accession #:    [id number]     Weight:       121.3 lb Date of Birth:   08-May-1946      BSA:          1.509 m Patient Age:    76 years       BP:           130/48 mmHg Patient Gender: F              HR:           79 bpm. Exam Location:  Inpatient Procedure: 2D Echo, Cardiac Doppler and Color Doppler Indications:    R07.9 CHEST PAIN  History:        Patient has prior history of Echocardiogram examinations, most                 recent 07/21/2017. Signs/Symptoms:Murmur; Risk                 Factors:Hypertension. MVP.  Sonographer:    Beryle Beams Referring Phys: [id number] Alphonse Guild BRANCH  Sonographer Comments: No subcostal window. POST OP ABDOMEN IMPRESSIONS  1. Mild inferoseptal hypokinesis. Left ventricular ejection fraction, by estimation, is 50%. The left ventricle has low normal function. The left ventricle demonstrates regional wall motion abnormalities (see scoring diagram/findings for description). There is mild left ventricular hypertrophy. Left ventricular diastolic parameters are indeterminate. Elevated left atrial pressure.  2. Right ventricular systolic function is normal. The right ventricular size is normal. Tricuspid regurgitation signal is inadequate for assessing PA pressure.  3. The mitral valve is abnormal. Mild to moderate mitral valve regurgitation. No evidence of mitral stenosis.  4. The tricuspid valve is abnormal.  5. The aortic valve has an indeterminant number of cusps. Aortic valve regurgitation is mild. No aortic stenosis is present. FINDINGS  Left Ventricle: Mild inferoseptal hypokinesis. Left ventricular ejection fraction, by estimation, is 50%. The left ventricle has low normal function. The left ventricle demonstrates regional wall motion abnormalities. The left ventricular internal cavity size was normal in size. There is mild left ventricular hypertrophy. Left ventricular diastolic parameters are indeterminate. Elevated left atrial pressure. Right Ventricle: The right ventricular size is normal. No increase in right ventricular wall thickness. Right  ventricular systolic function is normal. Tricuspid regurgitation signal is inadequate for assessing PA pressure. Left Atrium: Left atrial size was normal in size. Right Atrium: Right atrial size was normal in size. Pericardium: The pericardium was not well visualized. Mitral Valve: The mitral valve is abnormal. There is mild thickening of the mitral valve leaflet(s). There is mild calcification of the mitral valve leaflet(s). Mild mitral annular calcification. Mild to moderate mitral valve regurgitation. No evidence of mitral valve stenosis. MV peak gradient, 129.0 mmHg. The mean mitral valve gradient is 88.0 mmHg. Tricuspid Valve: The tricuspid valve is abnormal. Tricuspid valve regurgitation is mild . No evidence of tricuspid stenosis. Aortic Valve: The aortic valve has an indeterminant number of cusps. Aortic valve regurgitation is mild. Aortic regurgitation PHT measures 600 msec. No aortic stenosis is present. Aortic valve mean gradient measures 9.3 mmHg. Aortic valve peak gradient measures 20.0 mmHg. Aortic valve area, by VTI measures 1.54 cm. Pulmonic Valve: The pulmonic valve was not well visualized. Pulmonic valve regurgitation is trivial. No evidence of pulmonic stenosis. Aorta: The aortic  root is normal in size and structure. IAS/Shunts: The interatrial septum was not well visualized.  LEFT VENTRICLE PLAX 2D LVIDd:         4.50 cm     Diastology LVIDs:         3.40 cm     LV e' medial:    5.55 cm/s LV PW:         1.10 cm     LV E/e' medial:  18.9 LV IVS:        1.00 cm     LV e' lateral:   5.87 cm/s LVOT diam:     1.90 cm     LV E/e' lateral: 17.9 LV SV:         58 LV SV Index:   39 LVOT Area:     2.84 cm  LV Volumes (MOD) LV vol d, MOD A2C: 42.5 ml LV vol d, MOD A4C: 49.0 ml LV vol s, MOD A2C: 24.4 ml LV vol s, MOD A4C: 28.0 ml LV SV MOD A2C:     18.1 ml LV SV MOD A4C:     49.0 ml LV SV MOD BP:      19.2 ml RIGHT VENTRICLE RV S prime:     12.40 cm/s RVOT diam:      2.00 cm TAPSE (M-mode): 1.9 cm LEFT  ATRIUM             Index        RIGHT ATRIUM           Index LA diam:        3.70 cm 2.45 cm/m   RA Area:     11.50 cm LA Vol (A2C):   49.9 ml 33.07 ml/m  RA Volume:   24.70 ml  16.37 ml/m LA Vol (A4C):   35.5 ml 23.53 ml/m LA Biplane Vol: 44.4 ml 29.42 ml/m  AORTIC VALVE                     PULMONIC VALVE AV Area (Vmax):    1.16 cm      PV Vmax:       0.58 m/s AV Area (Vmean):   1.19 cm      PV Vmean:      37.800 cm/s AV Area (VTI):     1.54 cm      PV VTI:        0.125 m AV Vmax:           223.69 cm/s   PV Peak grad:  1.3 mmHg AV Vmean:          147.413 cm/s  PV Mean grad:  1.0 mmHg AV VTI:            0.379 m AV Peak Grad:      20.0 mmHg AV Mean Grad:      9.3 mmHg LVOT Vmax:         91.60 cm/s LVOT Vmean:        61.800 cm/s LVOT VTI:          0.206 m LVOT/AV VTI ratio: 0.54 AI PHT:            600 msec  AORTA Ao Root diam: 2.50 cm Ao Asc diam:  2.50 cm MITRAL VALVE                TRICUSPID VALVE MV Area (PHT): 1.63 cm     TV Peak grad:   32.5 mmHg MV Peak  grad:  129.0 mmHg   TV Mean grad:   24.0 mmHg MV Mean grad:  88.0 mmHg    TV Vmax:        2.85 m/s MV Vmax:       5.68 m/s     TV Vmean:       232.0 cm/s MV Vmean:      448.0 cm/s   TV VTI:         0.93 msec MV Decel Time: 465 msec MV E velocity: 105.00 cm/s  SHUNTS MV A velocity: 111.00 cm/s  Systemic VTI:  0.21 m MV E/A ratio:  0.95         Systemic Diam: 1.90 cm                             Pulmonic Diam: 2.00 cm Carlyle Dolly MD Electronically signed by Carlyle Dolly MD Signature Date/Time: 06/30/2021/12:02:35 PM    Final     Cardiac Studies   Echocardiogram 06/30/2021  1. Mild inferoseptal hypokinesis. Left ventricular ejection fraction, by  estimation, is 50%. The left ventricle has low normal function. The left  ventricle demonstrates regional wall motion abnormalities (see scoring  diagram/findings for description).  There is mild left ventricular hypertrophy. Left ventricular diastolic  parameters are indeterminate. Elevated left  atrial pressure.   2. Right ventricular systolic function is normal. The right ventricular  size is normal. Tricuspid regurgitation signal is inadequate for assessing  PA pressure.   3. The mitral valve is abnormal. Mild to moderate mitral valve  regurgitation. No evidence of mitral stenosis.   4. The tricuspid valve is abnormal.   5. The aortic valve has an indeterminant number of cusps. Aortic valve  regurgitation is mild. No aortic stenosis is present.   Patient Profile     76 y.o. female with a hx of metastatic breast cancer who was seen on 2/18 for the evaluation of postop cardiac arrest and inferior STEMI    Assessment & Plan    Cardiac Arrest  Inferior STEMI  Ischemic Cardiomyopathy  - Patient had VT arrest while in the PACU on 2/18. Chest compressions and defibrillated x1, received epi. ROSC in 90 sec.  - HSTN 952>>14,681>> 24,000 - EKG after resuscitation showed inferior STEMI  - Echo this admission (2/19) showed LVEF mildly reduced (50%) with mild AI, moderate MR, inferior WMA  - Case discussed with IC. Patient not a candidate for cath due to metastatic cancer s/p colectomy  - On daily ASA  - Started full anticoagulation with heparin yesterday 2/19 after OK from surgery. Hemoglobin stable at 8.4 this AM (was 8.5 yesterday). Hematocrit stable at 28.1 (28 yesterday)  - Currently on Imdur 30mg  daily. Will stop nitro drip as patient denies reoccurrences of chest pain  - BP stable, HR in 90s-100s, patient can likely tolerate low dose BB at this time. Will start carvedilol 3.25 mg BID. Can titrate as tolerated   HLD  - LDL 75, HDL 30, triglycerides 183 on 06/30/21  - Patient has a documented statin allergy. Was started on low dose pravastatin this admission, tolerating well. Will continue to monitor for any adverse reactions   Metastatic breast cancer - Followed by oncology  - Palliative care consulted this admission   S/p colectomy  - Managed by surgery       For  questions or updates, please contact [facility] Please consult [email or web address] for contact info under  Signed, Margie Billet, PA-C  07/01/2021, 7:27 AM     Personally seen and examined. Agree with APP above with the following comments: Briefly 76 yo F with Arrest- Inferior STEMI planned for medical mgmt Patient notes the arm clenching cessation is gone.  Notes some itching on statin but is amenable to continue in the setting of STEMI Exam notable for systolic murmur, no bleeding around ostomy bag. Otherwise as above. BP has continued to improved Labs notable for hgb 8.4-8.5 stable  Personally reviewed relevant tests; LVEF 45-50% with inferior WMAs Would recommend  - ASA, AC for 48 hours, continue Imdur and will start low dose Coreg today - if itching gets worse can start low dose benadryl, if we are unable to control sx will switch to Zetia 10 - though overall prognosis is poor in the setting of comorbidity she has made remarkable cardiac recovery  - Updated son  Rudean Haskell, MD [facility]  [address] [place], [place] [address] [phone]  10:14 AM

## 2021-07-01 NOTE — Progress Notes (Signed)
ANTICOAGULATION CONSULT NOTE    Pharmacy Consult for IV heparin Indication: chest pain/ACS  Allergies  Allergen Reactions   Statins Nausea And Vomiting and Other (See Comments)    MUSCLE PAIN   Amlodipine     fatigue   Clonidine Derivatives     headache   Losartan     abd pain   Sulfa Antibiotics Nausea Only   Sulfamethoxazole-Trimethoprim Nausea Only    " Severe Nausea "    Patient Measurements: Weight: 57.1 kg (125 lb 14.1 oz) Heparin Dosing Weight: 55 kg  Vital Signs: Temp: 97.8 F (36.6 C) (02/20 1546) Temp Source: Oral (02/20 1546) BP: 136/64 (02/20 1500) Pulse Rate: 91 (02/20 1500)  Labs: Recent Labs    06/29/21 1016 06/29/21 1340 06/29/21 1621 06/29/21 2004 06/30/21 0320 06/30/21 0518 06/30/21 1443 06/30/21 2129 07/01/21 0245 07/01/21 0651 07/01/21 1400 07/01/21 1700  HGB 8.8*   < >  --   --  8.5*  --   --   --  8.4*  --  9.2*  --   HCT 26.0*   < >  --   --  28.0*  --   --   --  28.1*  --  31.0*  --   PLT  --   --   --   --  302  --   --   --  298  --  345  --   APTT  --   --   --   --   --   --  49*  --   --   --   --   --   LABPROT  --   --   --   --   --   --  16.1*  --   --   --   --   --   INR  --   --   --   --   --   --  1.3*  --   --   --   --   --   HEPARINUNFRC  --   --   --   --   --   --   --  0.30  --  0.25*  --  0.29*  CREATININE 0.30*  --   --   --  0.52  --   --   --  0.55  --   --   --   TROPONINIHS  --   --  952* 14,681*  --  >24,000*  --   --   --   --   --   --    < > = values in this interval not displayed.     Estimated Creatinine Clearance: 47.3 mL/min (by C-G formula based on SCr of 0.55 mg/dL).   Medical History: Past Medical History:  Diagnosis Date   Breast mass, left    Large   Cancer ([place])    FIBROMYOMA  AGE 40-   BENIGN  RESOLVED   GI bleed 05/03/2021   1/23 continue with pantoprazole   Headache    Heart murmur    Hypertension    Mitral valve prolapse    Tuberculosis    AS CHILD  W/ TX    Medications:   No anticoagulation meds prior to admission    Assessment: Pharmacy consulted to dose IV heparin for this 76 yo female with h/o metastatic cancer who is post-op lap R colectomy, end ileostomy.  Experienced STEMI and cardiac arrest in PACU  on 2/19.  Cards trialing full [facility] 2/19 with heparin.   HL improved but still below goal at 0.29 despite rate increase to 850 units/hr Confirmed with RN no complications of heparin therapy and no line issues  Goal of Therapy:  Heparin level 0.3-0.7 units/ml Monitor platelets by anticoagulation protocol: Yes   Plan:  Increase heparin infusion to 950 units/hr Check heparin level in 8 hours  Daily heparin level & CBC   Netta Cedars, PharmD 07/01/2021 7:11 PM

## 2021-07-01 NOTE — Consult Note (Addendum)
[facility] Nurse ostomy consult note Stoma type/location: ileostomy  Patient would not allow me to see ostomy when I came to her room.   Will follow up later today to see if she will agree. Would not allow me to even turn interpreter services machine on, she is having pain and wants that addressed first.     Educational materials left in the room, will enroll in SS once she is more receptive to visit.   [place], CNS, CWON-AP [phone]   [id number] patient still in pain, refusing treatment from PT. Will follow up.   Discussed POC with patient and bedside nurse.  Re consult if needed, will not follow at this time. Thanks  Keldrick Pomplun [employer], RN,CWOCN, CNS, [place] [phone])

## 2021-07-02 DIAGNOSIS — K561 Intussusception: Secondary | ICD-10-CM | POA: Diagnosis not present

## 2021-07-02 LAB — BASIC METABOLIC PANEL
Anion gap: 7 (ref 5–15)
BUN: 10 mg/dL (ref 8–23)
CO2: 21 mmol/L — ABNORMAL LOW (ref 22–32)
Calcium: 7.6 mg/dL — ABNORMAL LOW (ref 8.9–10.3)
Chloride: 107 mmol/L (ref 98–111)
Creatinine, Ser: 0.52 mg/dL (ref 0.44–1.00)
GFR, Estimated: >60 mL/min (ref >60)
Glucose, Bld: 103 mg/dL — ABNORMAL HIGH (ref 70–99)
Potassium: 3.7 mmol/L (ref 3.5–5.1)
Sodium: 135 mmol/L (ref 135–145)

## 2021-07-02 LAB — CBC
HCT: 31.7 % — ABNORMAL LOW (ref 36.0–46.0)
Hemoglobin: 9.6 g/dL — ABNORMAL LOW (ref 12.0–15.0)
MCH: 25.5 pg — ABNORMAL LOW (ref 26.0–34.0)
MCHC: 30.3 g/dL (ref 30.0–36.0)
MCV: 84.3 fL (ref 80.0–100.0)
Platelets: 367 10*3/uL (ref 150–400)
RBC: 3.76 MIL/uL — ABNORMAL LOW (ref 3.87–5.11)
RDW: 26.5 % — ABNORMAL HIGH (ref 11.5–15.5)
WBC: 17.4 10*3/uL — ABNORMAL HIGH (ref 4.0–10.5)
nRBC: 0 % (ref 0.0–0.2)

## 2021-07-02 LAB — HEPARIN LEVEL (UNFRACTIONATED): Heparin Unfractionated: 0.5 IU/mL (ref 0.30–0.70)

## 2021-07-02 MED ORDER — METHOCARBAMOL 500 MG PO TABS
500.0000 mg | ORAL_TABLET | Freq: Three times a day (TID) | ORAL | Status: DC
Start: 2021-07-02 — End: 2021-07-24
  Administered 2021-07-02 – 2021-07-24 (×59): 500 mg via ORAL
  Filled 2021-07-02 (×60): qty 1

## 2021-07-02 MED ORDER — EZETIMIBE 10 MG PO TABS
10.0000 mg | ORAL_TABLET | Freq: Every day | ORAL | Status: DC
  Administered 2021-07-02: 10 mg via ORAL
  Filled 2021-07-02 (×2): qty 1

## 2021-07-02 MED ORDER — CARVEDILOL 6.25 MG PO TABS
6.2500 mg | ORAL_TABLET | Freq: Two times a day (BID) | ORAL | Status: DC
  Administered 2021-07-04 – 2021-07-10 (×13): 6.25 mg via ORAL
  Filled 2021-07-02 (×14): qty 1

## 2021-07-02 NOTE — Progress Notes (Signed)
[facility] Progress Note  3 Days Post-Op  Subjective: CC:  NAEO. Having some abd pain with movement. Getting up to bedside commode to urinate but has been refusing PT.   Objective: Vital signs in last 24 hours: Temp:  [97.3 F (36.3 C)-98.2 F (36.8 C)] 98.2 F (36.8 C) (02/21 0735) Pulse Rate:  [86-116] 90 (02/21 0700) Resp:  [16-31] 18 (02/21 0700) BP: (114-139)/(56-101) 118/62 (02/21 0700) SpO2:  [96 %-99 %] 98 % (02/21 0700) Weight:  [58.6 kg] 58.6 kg (02/21 0500) Last BM Date : 07/01/21  Intake/Output from previous day: 02/20 0701 - 02/21 0700 In: 2157.1 [I.V.:1997.4; IV Piggyback:149.7] Out: 750 [Urine:700; Stool:50] Intake/Output this shift: Total I/O In: 342.7 [I.V.:307.2; IV Piggyback:35.5] Out: -   PE: Gen:  Alert, NAD, pleasant and cooperative  Card:  Regular rate and rhythm, 85 bpm during my exam Pulm:  Normal effort Abd: Soft, appropriately tender, midline dressing clean and recently changed, ileostomy pouch with brown stool in pouch, non bloody. Stoma pale but viable.  Skin: warm and dry, no rashes  Psych: A&Ox3   Lab Results:  Recent Labs    07/01/21 1400 07/02/21 0350  WBC 20.7* 17.4*  HGB 9.2* 9.6*  HCT 31.0* 31.7*  PLT 345 367   BMET Recent Labs    07/01/21 0245 07/02/21 0350  NA 133* 135  K 3.3* 3.7  CL 107 107  CO2 22 21*  GLUCOSE 120* 103*  BUN 8 10  CREATININE 0.55 0.52  CALCIUM 7.2* 7.6*   PT/INR Recent Labs    06/30/21 1443  LABPROT 16.1*  INR 1.3*   CMP     Component Value Date/Time   NA 135 07/02/2021 0350   K 3.7 07/02/2021 0350   CL 107 07/02/2021 0350   CO2 21 (L) 07/02/2021 0350   GLUCOSE 103 (H) 07/02/2021 0350   BUN 10 07/02/2021 0350   CREATININE 0.52 07/02/2021 0350   CREATININE 0.86 11/09/2017 0831   CALCIUM 7.6 (L) 07/02/2021 0350   PROT 5.3 (L) 06/29/2021 0623   ALBUMIN 2.1 (L) 06/29/2021 0623   AST 17 06/29/2021 0623   AST 23 11/09/2017 0831   ALT 9 06/29/2021 0623   ALT 34  11/09/2017 0831   ALKPHOS 132 (H) 06/29/2021 0623   BILITOT 0.7 06/29/2021 0623   BILITOT 0.4 11/09/2017 0831   GFRNONAA >60 07/02/2021 0350   GFRNONAA >60 11/09/2017 0831   GFRAA >60 04/28/2018 1439   GFRAA >60 11/09/2017 0831   Lipase     Component Value Date/Time   LIPASE 27 06/28/2021 1644       Studies/Results: ECHOCARDIOGRAM COMPLETE  Result Date: 06/30/2021    ECHOCARDIOGRAM REPORT   Patient Name:   Robin Estrada Date of Exam: 06/30/2021 Medical Rec #:  [id number]      Height:       60.0 in Accession #:    [id number]     Weight:       121.3 lb Date of Birth:  [birth year]      BSA:          1.509 m Patient Age:    76 years       BP:           130/48 mmHg Patient Gender: F              HR:           79 bpm. Exam Location:  Inpatient Procedure: 2D Echo, Cardiac Doppler and Color Doppler Indications:  R07.9 CHEST PAIN  History:        Patient has prior history of Echocardiogram examinations, most                 recent 07/21/2017. Signs/Symptoms:Murmur; Risk                 Factors:Hypertension. MVP.  Sonographer:    Beryle Beams Referring Phys: [id number] Alphonse Guild BRANCH  Sonographer Comments: No subcostal window. POST OP ABDOMEN IMPRESSIONS  1. Mild inferoseptal hypokinesis. Left ventricular ejection fraction, by estimation, is 50%. The left ventricle has low normal function. The left ventricle demonstrates regional wall motion abnormalities (see scoring diagram/findings for description). There is mild left ventricular hypertrophy. Left ventricular diastolic parameters are indeterminate. Elevated left atrial pressure.  2. Right ventricular systolic function is normal. The right ventricular size is normal. Tricuspid regurgitation signal is inadequate for assessing PA pressure.  3. The mitral valve is abnormal. Mild to moderate mitral valve regurgitation. No evidence of mitral stenosis.  4. The tricuspid valve is abnormal.  5. The aortic valve has an indeterminant number of cusps. Aortic valve  regurgitation is mild. No aortic stenosis is present. FINDINGS  Left Ventricle: Mild inferoseptal hypokinesis. Left ventricular ejection fraction, by estimation, is 50%. The left ventricle has low normal function. The left ventricle demonstrates regional wall motion abnormalities. The left ventricular internal cavity size was normal in size. There is mild left ventricular hypertrophy. Left ventricular diastolic parameters are indeterminate. Elevated left atrial pressure. Right Ventricle: The right ventricular size is normal. No increase in right ventricular wall thickness. Right ventricular systolic function is normal. Tricuspid regurgitation signal is inadequate for assessing PA pressure. Left Atrium: Left atrial size was normal in size. Right Atrium: Right atrial size was normal in size. Pericardium: The pericardium was not well visualized. Mitral Valve: The mitral valve is abnormal. There is mild thickening of the mitral valve leaflet(s). There is mild calcification of the mitral valve leaflet(s). Mild mitral annular calcification. Mild to moderate mitral valve regurgitation. No evidence of mitral valve stenosis. MV peak gradient, 129.0 mmHg. The mean mitral valve gradient is 88.0 mmHg. Tricuspid Valve: The tricuspid valve is abnormal. Tricuspid valve regurgitation is mild . No evidence of tricuspid stenosis. Aortic Valve: The aortic valve has an indeterminant number of cusps. Aortic valve regurgitation is mild. Aortic regurgitation PHT measures 600 msec. No aortic stenosis is present. Aortic valve mean gradient measures 9.3 mmHg. Aortic valve peak gradient measures 20.0 mmHg. Aortic valve area, by VTI measures 1.54 cm. Pulmonic Valve: The pulmonic valve was not well visualized. Pulmonic valve regurgitation is trivial. No evidence of pulmonic stenosis. Aorta: The aortic root is normal in size and structure. IAS/Shunts: The interatrial septum was not well visualized.  LEFT VENTRICLE PLAX 2D LVIDd:         4.50 cm      Diastology LVIDs:         3.40 cm     LV e' medial:    5.55 cm/s LV PW:         1.10 cm     LV E/e' medial:  18.9 LV IVS:        1.00 cm     LV e' lateral:   5.87 cm/s LVOT diam:     1.90 cm     LV E/e' lateral: 17.9 LV SV:         58 LV SV Index:   39 LVOT Area:     2.84 cm  LV  Volumes (MOD) LV vol d, MOD A2C: 42.5 ml LV vol d, MOD A4C: 49.0 ml LV vol s, MOD A2C: 24.4 ml LV vol s, MOD A4C: 28.0 ml LV SV MOD A2C:     18.1 ml LV SV MOD A4C:     49.0 ml LV SV MOD BP:      19.2 ml RIGHT VENTRICLE RV S prime:     12.40 cm/s RVOT diam:      2.00 cm TAPSE (M-mode): 1.9 cm LEFT ATRIUM             Index        RIGHT ATRIUM           Index LA diam:        3.70 cm 2.45 cm/m   RA Area:     11.50 cm LA Vol (A2C):   49.9 ml 33.07 ml/m  RA Volume:   24.70 ml  16.37 ml/m LA Vol (A4C):   35.5 ml 23.53 ml/m LA Biplane Vol: 44.4 ml 29.42 ml/m  AORTIC VALVE                     PULMONIC VALVE AV Area (Vmax):    1.16 cm      PV Vmax:       0.58 m/s AV Area (Vmean):   1.19 cm      PV Vmean:      37.800 cm/s AV Area (VTI):     1.54 cm      PV VTI:        0.125 m AV Vmax:           223.69 cm/s   PV Peak grad:  1.3 mmHg AV Vmean:          147.413 cm/s  PV Mean grad:  1.0 mmHg AV VTI:            0.379 m AV Peak Grad:      20.0 mmHg AV Mean Grad:      9.3 mmHg LVOT Vmax:         91.60 cm/s LVOT Vmean:        61.800 cm/s LVOT VTI:          0.206 m LVOT/AV VTI ratio: 0.54 AI PHT:            600 msec  AORTA Ao Root diam: 2.50 cm Ao Asc diam:  2.50 cm MITRAL VALVE                TRICUSPID VALVE MV Area (PHT): 1.63 cm     TV Peak grad:   32.5 mmHg MV Peak grad:  129.0 mmHg   TV Mean grad:   24.0 mmHg MV Mean grad:  88.0 mmHg    TV Vmax:        2.85 m/s MV Vmax:       5.68 m/s     TV Vmean:       232.0 cm/s MV Vmean:      448.0 cm/s   TV VTI:         0.93 msec MV Decel Time: 465 msec MV E velocity: 105.00 cm/s  SHUNTS MV A velocity: 111.00 cm/s  Systemic VTI:  0.21 m MV E/A ratio:  0.95         Systemic Diam: 1.90 cm  Pulmonic Diam: 2.00 cm Carlyle Dolly MD Electronically signed by Carlyle Dolly MD Signature Date/Time: 06/30/2021/12:02:35 PM    Final     Anti-infectives: Anti-infectives (From admission, onward)    Start     Dose/Rate Route Frequency Ordered Stop   06/28/21 2200  piperacillin-tazobactam (ZOSYN) IVPB 3.375 g        3.375 g 12.5 mL/hr over 240 Minutes Intravenous Every 8 hours 06/28/21 2120 07/04/21 1032        Assessment/Plan  Intussusception of cecum  S/P laparoscopic assisted partial R colectomy, end ileostomy 06/29/21 POD#5, AFVSS  - WBC 15 from 17 - ileostomy working, monitor output to avoid dehydration/electrolyte disturbance.  - PO pain control, currently using tylenol and fentanyl. Wants to avoid narcotics. I added robaxin today.  - tolerating PO but not eating much, doesn't like ensure, I have consulted RD for assistance with supplements.  - PT/OT - noted palliative consult note, full code, patients goal is to go home with [facility]   FEN: carb mod, IVF per primary team ID: Zosyn, plan to stop after POD#5 VTE: SCD's, hep gtt for ACS stopping this afternoon per cardiology  Foley: in place, removal per primary team  Dispo: ICU   Cardiac arrest, inferior STEMI  Metastatic breast cancer  PMH GIB HTN Acute in chronic anemia     LOS: 4 days   I reviewed nursing notes, hospitalist notes, last 24 h vitals and pain scores, last 48 h intake and output, last 24 h labs and trends, and last 24 h imaging results.  This care required moderate level of medical decision making.   Obie Dredge, PA-C [facility] Please see Amion for pager number during day hours 7:00am-4:30pm

## 2021-07-02 NOTE — Progress Notes (Addendum)
Progress Note  Patient Name: Robin Estrada Date of Encounter: 07/02/2021  [facility] Cardiologist: Jenkins Rouge, MD   Subjective   No acute overnight events. Patient doing well from a cardiac standpoint. She denies any chest pain or shortness of breath. She does states that when she exerts herself more, she can feel her heart but it sounds like she just feels her heart beating harder but no exertional chest pain. Patient's main complaint today is right leg pain. She states she has had this for a couple of months but it has gotten much more frequent and now occurs on a daily basis. She states Tylenol helps for about 2 hours and then pain returns. She has good distal pedal pulses.   [place] interpreter Neurosurgeon [phone]) used.  Inpatient Medications    Scheduled Meds:  acetaminophen (TYLENOL) oral liquid 160 mg/5 mL  1,000 mg Oral Q6H   aspirin EC  81 mg Oral Daily   carvedilol  6.25 mg Oral BID WC   Chlorhexidine Gluconate Cloth  6 each Topical Daily   dicyclomine  10 mg Oral Once   feeding supplement  237 mL Oral BID BM   isosorbide mononitrate  30 mg Oral Daily   lidocaine  1 patch Transdermal Q24H   methocarbamol  500 mg Oral TID   pantoprazole (PROTONIX) IV  40 mg Intravenous Q24H   pravastatin  20 mg Oral q1800   Continuous Infusions:  0.9 % NaCl with KCl 20 mEq / L 50 mL/hr at 07/02/21 0815   heparin 950 Units/hr (07/02/21 0815)   piperacillin-tazobactam (ZOSYN)  IV 12.5 mL/hr at 07/02/21 0815   PRN Meds: alum & mag hydroxide-simeth, dicyclomine, diphenhydrAMINE **OR** diphenhydrAMINE, fentaNYL (SUBLIMAZE) injection, HYDROmorphone (DILAUDID) injection, ondansetron **OR** ondansetron (ZOFRAN) IV, simethicone   Vital Signs    Vitals:   07/02/21 0506 07/02/21 0600 07/02/21 0700 07/02/21 0735  BP:  124/61 118/62   Pulse: 98 96 90   Resp: (!) 23 (!) 25 18   Temp:    98.2 F (36.8 C)  TempSrc:    Axillary  SpO2: 97% 97% 98%   Weight:        Intake/Output  Summary (Last 24 hours) at 07/02/2021 0857 Last data filed at 07/02/2021 0815 Gross per 24 hour  Intake 2499.79 ml  Output 750 ml  Net 1749.79 ml   Last 3 Weights 07/02/2021 07/01/2021 06/30/2021  Weight (lbs) 129 lb 3 oz 125 lb 14.1 oz 121 lb 4.1 oz  Weight (kg) 58.6 kg 57.1 kg 55 kg      Telemetry    Sinus rhythm with rates in the 80s to 90s. 1st degree AV block. - Personally Reviewed  ECG    No new ECG tracing today. - Personally Reviewed  Physical Exam   GEN: No acute distress.   Neck: No JVD. Cardiac: RRR. No significant murmurs, rubs, or gallops appreciated. Radial and distal pedal pulses 2+ and equal bilaterally. Respiratory: Clear to auscultation anteriorly. GI: Soft, non-distended, and non-tender with very light palpation. Ostomy bag in place. MS: Trace to 1+ pitting edema of bilatearl lower extremity edema. No deformity. Skin: Warm and dry. Neuro:  No focal deficits. Psych: Normal affect. Responds appropriately.  Labs    High Sensitivity Troponin:   Recent Labs  Lab 06/29/21 1621 06/29/21 2004 06/30/21 0518  TROPONINIHS 952* 14,681* >24,000*     Chemistry Recent Labs  Lab 06/28/21 1644 06/28/21 1758 06/29/21 0623 06/29/21 1016 06/30/21 0320 07/01/21 0245 07/02/21 0350  NA 130*   < >  133*   < > 134* 133* 135  K 3.8   < > 3.2*   < > 3.2* 3.3* 3.7  CL 95*   < > 100   < > 101 107 107  CO2 23  --  23  --  22 22 21*  GLUCOSE 109*   < > 95   < > 161* 120* 103*  BUN 8   < > 6*   < > 6* 8 10  CREATININE 0.69   < > 0.51   < > 0.52 0.55 0.52  CALCIUM 8.4*  --  7.8*  --  7.7* 7.2* 7.6*  MG  --   --  1.8  --  1.6* 2.2  --   PROT 6.1*  --  5.3*  --   --   --   --   ALBUMIN 2.5*  --  2.1*  --   --   --   --   AST 24  --  17  --   --   --   --   ALT 8  --  9  --   --   --   --   ALKPHOS 165*  --  132*  --   --   --   --   BILITOT 1.9*  --  0.7  --   --   --   --   GFRNONAA >60  --  >60  --  >60 >60 >60  ANIONGAP 12  --  10  --  11 4* 7   < > = values in this  interval not displayed.    Lipids  Recent Labs  Lab 06/30/21 0320  CHOL 142  TRIG 183*  HDL 30*  LDLCALC 75  CHOLHDL 4.7    Hematology Recent Labs  Lab 07/01/21 0245 07/01/21 1400 07/02/21 0350  WBC 17.5* 20.7* 17.4*  RBC 3.32* 3.63* 3.76*  HGB 8.4* 9.2* 9.6*  HCT 28.1* 31.0* 31.7*  MCV 84.6 85.4 84.3  MCH 25.3* 25.3* 25.5*  MCHC 29.9* 29.7* 30.3  RDW 25.7* 26.1* 26.5*  PLT 298 345 367   Thyroid No results for input(s): TSH, FREET4 in the last 168 hours.  BNPNo results for input(s): BNP, PROBNP in the last 168 hours.  DDimer No results for input(s): DDIMER in the last 168 hours.   Radiology    ECHOCARDIOGRAM COMPLETE  Result Date: 06/30/2021    ECHOCARDIOGRAM REPORT   Patient Name:   Robin Estrada Date of Exam: 06/30/2021 Medical Rec #:  [id number]      Height:       60.0 in Accession #:    [id number]     Weight:       121.3 lb Date of Birth:  03/23/1946      BSA:          1.509 m Patient Age:    76 years       BP:           130/48 mmHg Patient Gender: F              HR:           79 bpm. Exam Location:  Inpatient Procedure: 2D Echo, Cardiac Doppler and Color Doppler Indications:    R07.9 CHEST PAIN  History:        Patient has prior history of Echocardiogram examinations, most                 recent 07/21/2017. Signs/Symptoms:Murmur;  Risk                 Factors:Hypertension. MVP.  Sonographer:    Beryle Beams Referring Phys: [id number] Alphonse Guild BRANCH  Sonographer Comments: No subcostal window. POST OP ABDOMEN IMPRESSIONS  1. Mild inferoseptal hypokinesis. Left ventricular ejection fraction, by estimation, is 50%. The left ventricle has low normal function. The left ventricle demonstrates regional wall motion abnormalities (see scoring diagram/findings for description). There is mild left ventricular hypertrophy. Left ventricular diastolic parameters are indeterminate. Elevated left atrial pressure.  2. Right ventricular systolic function is normal. The right ventricular size is  normal. Tricuspid regurgitation signal is inadequate for assessing PA pressure.  3. The mitral valve is abnormal. Mild to moderate mitral valve regurgitation. No evidence of mitral stenosis.  4. The tricuspid valve is abnormal.  5. The aortic valve has an indeterminant number of cusps. Aortic valve regurgitation is mild. No aortic stenosis is present. FINDINGS  Left Ventricle: Mild inferoseptal hypokinesis. Left ventricular ejection fraction, by estimation, is 50%. The left ventricle has low normal function. The left ventricle demonstrates regional wall motion abnormalities. The left ventricular internal cavity size was normal in size. There is mild left ventricular hypertrophy. Left ventricular diastolic parameters are indeterminate. Elevated left atrial pressure. Right Ventricle: The right ventricular size is normal. No increase in right ventricular wall thickness. Right ventricular systolic function is normal. Tricuspid regurgitation signal is inadequate for assessing PA pressure. Left Atrium: Left atrial size was normal in size. Right Atrium: Right atrial size was normal in size. Pericardium: The pericardium was not well visualized. Mitral Valve: The mitral valve is abnormal. There is mild thickening of the mitral valve leaflet(s). There is mild calcification of the mitral valve leaflet(s). Mild mitral annular calcification. Mild to moderate mitral valve regurgitation. No evidence of mitral valve stenosis. MV peak gradient, 129.0 mmHg. The mean mitral valve gradient is 88.0 mmHg. Tricuspid Valve: The tricuspid valve is abnormal. Tricuspid valve regurgitation is mild . No evidence of tricuspid stenosis. Aortic Valve: The aortic valve has an indeterminant number of cusps. Aortic valve regurgitation is mild. Aortic regurgitation PHT measures 600 msec. No aortic stenosis is present. Aortic valve mean gradient measures 9.3 mmHg. Aortic valve peak gradient measures 20.0 mmHg. Aortic valve area, by VTI measures 1.54  cm. Pulmonic Valve: The pulmonic valve was not well visualized. Pulmonic valve regurgitation is trivial. No evidence of pulmonic stenosis. Aorta: The aortic root is normal in size and structure. IAS/Shunts: The interatrial septum was not well visualized.  LEFT VENTRICLE PLAX 2D LVIDd:         4.50 cm     Diastology LVIDs:         3.40 cm     LV e' medial:    5.55 cm/s LV PW:         1.10 cm     LV E/e' medial:  18.9 LV IVS:        1.00 cm     LV e' lateral:   5.87 cm/s LVOT diam:     1.90 cm     LV E/e' lateral: 17.9 LV SV:         58 LV SV Index:   39 LVOT Area:     2.84 cm  LV Volumes (MOD) LV vol d, MOD A2C: 42.5 ml LV vol d, MOD A4C: 49.0 ml LV vol s, MOD A2C: 24.4 ml LV vol s, MOD A4C: 28.0 ml LV SV MOD A2C:     18.1  ml LV SV MOD A4C:     49.0 ml LV SV MOD BP:      19.2 ml RIGHT VENTRICLE RV S prime:     12.40 cm/s RVOT diam:      2.00 cm TAPSE (M-mode): 1.9 cm LEFT ATRIUM             Index        RIGHT ATRIUM           Index LA diam:        3.70 cm 2.45 cm/m   RA Area:     11.50 cm LA Vol (A2C):   49.9 ml 33.07 ml/m  RA Volume:   24.70 ml  16.37 ml/m LA Vol (A4C):   35.5 ml 23.53 ml/m LA Biplane Vol: 44.4 ml 29.42 ml/m  AORTIC VALVE                     PULMONIC VALVE AV Area (Vmax):    1.16 cm      PV Vmax:       0.58 m/s AV Area (Vmean):   1.19 cm      PV Vmean:      37.800 cm/s AV Area (VTI):     1.54 cm      PV VTI:        0.125 m AV Vmax:           223.69 cm/s   PV Peak grad:  1.3 mmHg AV Vmean:          147.413 cm/s  PV Mean grad:  1.0 mmHg AV VTI:            0.379 m AV Peak Grad:      20.0 mmHg AV Mean Grad:      9.3 mmHg LVOT Vmax:         91.60 cm/s LVOT Vmean:        61.800 cm/s LVOT VTI:          0.206 m LVOT/AV VTI ratio: 0.54 AI PHT:            600 msec  AORTA Ao Root diam: 2.50 cm Ao Asc diam:  2.50 cm MITRAL VALVE                TRICUSPID VALVE MV Area (PHT): 1.63 cm     TV Peak grad:   32.5 mmHg MV Peak grad:  129.0 mmHg   TV Mean grad:   24.0 mmHg MV Mean grad:  88.0 mmHg    TV  Vmax:        2.85 m/s MV Vmax:       5.68 m/s     TV Vmean:       232.0 cm/s MV Vmean:      448.0 cm/s   TV VTI:         0.93 msec MV Decel Time: 465 msec MV E velocity: 105.00 cm/s  SHUNTS MV A velocity: 111.00 cm/s  Systemic VTI:  0.21 m MV E/A ratio:  0.95         Systemic Diam: 1.90 cm                             Pulmonic Diam: 2.00 cm Carlyle Dolly MD Electronically signed by Carlyle Dolly MD Signature Date/Time: 06/30/2021/12:02:35 PM    Final     Cardiac Studies   Echocardiogram 06/30/2021: Impressions:  1. Mild inferoseptal  hypokinesis. Left ventricular ejection fraction, by  estimation, is 50%. The left ventricle has low normal function. The left  ventricle demonstrates regional wall motion abnormalities (see scoring  diagram/findings for description).  There is mild left ventricular hypertrophy. Left ventricular diastolic  parameters are indeterminate. Elevated left atrial pressure.   2. Right ventricular systolic function is normal. The right ventricular  size is normal. Tricuspid regurgitation signal is inadequate for assessing  PA pressure.   3. The mitral valve is abnormal. Mild to moderate mitral valve  regurgitation. No evidence of mitral stenosis.   4. The tricuspid valve is abnormal.   5. The aortic valve has an indeterminant number of cusps. Aortic valve  regurgitation is mild. No aortic stenosis is present.   Patient Profile     76 y.o. female with a history of metastatic breast cancer with new vertebral masses, hypertension, and hyperlipidemia who presented on 06/28/2021 with worsening abdominal pain and nausea and found to have intussusception. She underwent laparoscopic right colectomy with end ileostomy on 06/29/2021. While in PACU, she had a VT arrest. ACLS was started and ROSC was achieved in 90 seconds. Post code EKG showed inferior STEMI. Given metastatic cancer and abdominal surgery, patient treated medically.  Assessment & Plan    VT Arrest Inferior  STEMI Ischemic Cardiomyopathy High-sensitivity troponin peaked at >24,000. Echo showed LVEF of 50% with mild inferoseptal hypokinesis, mild LVH, and mild to moderate MR. Patient not a cath candidate due to metastatic cancer with recent colectomy on 2/18 so is being treated medically.  - Patient denies any chest pain. - Started on IV Heparin on 2/19 after OK from surgery. Will continue for 48 hours which would be today around 1pm. - Continue Aspirin 81mg  daily and Imdur 30mg  daily. - Will increase Coreg to 6.25mg  twice daily for additional heart rate control. - Previously unable to tolerate statins due to muscle aches. She was started on Pravastatin this admission and is tolerated well aside from some itching. Continue for now.  Hypertension BP well controlled. - Will increase Coreg to 6.25mg  twice daily for additional heart rate control. - Continue Imdur 30mg  daily.  Hyperlipidemia Lipid panel this admission: Total Cholesterol 142, Triglycerides 183, HDL 30, LDL 75. - Previously unable to tolerate statins due to muscle aches. She was started on Pravastatin 20mg  daily this admission and is tolerated well aside from some itching. Continue for now. If unable to tolerate, will switch to Zetia 10mg  daily.  Otherwise, per primary team: - Metastatic breast cancer: palliative care consulted - Intussusception s/p colectomy  - Hyponatremia - History of GI bleed - Cystic mass of pancreas  For questions or updates, please contact [facility] Please consult [email or web address] for contact info under        Signed, Darreld Mclean, PA-C  07/02/2021, 8:57 AM    Personally seen and examined. Agree with APP above with the following comments: Briefly 76 yo F with metastatic Cancer with VF arrest and medically managed NSTEMI after surgery Patient notes that this morning when she went up to use the commode she have new myalagias.  Fine when not moving.  Wasn't sure if it was the statin or not. Exam  notable for trace pitting edema; stable ostomy site not pain on light palpations no bleeding. Labs notable for increasing hemoglobin - no further VT Would recommend  - increase coreg as above - stopping pravastatin and starting zetia 10 - stopping heparin  Interpertor ERES used.  Rest as above  [employer],  MD [address] [place], [place] [address] [phone]  1:01 PM

## 2021-07-02 NOTE — Progress Notes (Addendum)
ANTICOAGULATION CONSULT NOTE    Pharmacy Consult for IV heparin Indication: chest pain/ACS  Allergies  Allergen Reactions   Statins Nausea And Vomiting and Other (See Comments)    MUSCLE PAIN   Amlodipine     fatigue   Clonidine Derivatives     headache   Losartan     abd pain   Sulfa Antibiotics Nausea Only   Sulfamethoxazole-Trimethoprim Nausea Only    " Severe Nausea "    Patient Measurements: Weight: 57.1 kg (125 lb 14.1 oz) Heparin Dosing Weight: 55 kg  Vital Signs: Temp: 97.3 F (36.3 C) (02/21 0415) Temp Source: Oral (02/21 0415) BP: 137/66 (02/21 0300) Pulse Rate: 88 (02/21 0300)  Labs: Recent Labs    06/29/21 1621 06/29/21 2004 06/30/21 0320 06/30/21 0518 06/30/21 1443 06/30/21 2129 07/01/21 0245 07/01/21 0651 07/01/21 1400 07/01/21 1700 07/02/21 0350  HGB  --   --  8.5*  --   --   --  8.4*  --  9.2*  --  9.6*  HCT  --   --  28.0*  --   --   --  28.1*  --  31.0*  --  31.7*  PLT  --   --  302  --   --   --  298  --  345  --  367  APTT  --   --   --   --  49*  --   --   --   --   --   --   LABPROT  --   --   --   --  16.1*  --   --   --   --   --   --   INR  --   --   --   --  1.3*  --   --   --   --   --   --   HEPARINUNFRC  --   --   --   --   --    < >  --  0.25*  --  0.29* 0.50  CREATININE  --   --  0.52  --   --   --  0.55  --   --   --  0.52  TROPONINIHS 952* 14,681*  --  >24,000*  --   --   --   --   --   --   --    < > = values in this interval not displayed.     Estimated Creatinine Clearance: 47.3 mL/min (by C-G formula based on SCr of 0.52 mg/dL).   Medical History: Past Medical History:  Diagnosis Date   Breast mass, left    Large   Cancer ([place])    FIBROMYOMA  AGE 14-   BENIGN  RESOLVED   GI bleed 05/03/2021   1/23 continue with pantoprazole   Headache    Heart murmur    Hypertension    Mitral valve prolapse    Tuberculosis    AS CHILD  W/ TX    Medications:  No anticoagulation meds prior to admission     Assessment: Pharmacy consulted to dose IV heparin for this 76 yo female with h/o metastatic cancer who is post-op lap R colectomy, end ileostomy.  Experienced STEMI and cardiac arrest in PACU on 2/19.  Cards trialing full [facility] 2/19 with heparin.   07/02/2021 HL 0.5 therapeutic on 950 units/hr Hgb low but stable, plts WNL No bleeding noted per RN  Goal of Therapy:  Heparin level 0.3-0.7 units/ml Monitor platelets by anticoagulation protocol: Yes   Plan:  Continue heparin infusion at 950 units/hr Check confirmatory heparin level in 8 hours  Daily heparin level & CBC   Dolly Rias RPh 07/02/2021, 4:29 AM

## 2021-07-02 NOTE — Progress Notes (Signed)
OT Cancellation Note  Patient Details Name: Robin Estrada MRN: [id number] DOB: 1945/06/09   Cancelled Treatment:    Reason Eval/Treat Not Completed: Patient declined, no reason specified. First attempt patient just back to bed with nursing staff. Second attempt patient stating "not today, each day little better but not today." Will re-attempt as schedule permits.  Delbert Phenix OT OT pager: [phone] 07/02/2021, 1:49 PM

## 2021-07-02 NOTE — Progress Notes (Signed)
PT Cancellation Note  Patient Details Name: Surya Folden MRN: [id number] DOB: 03-12-1946   Cancelled Treatment:    Reason Eval/Treat Not Completed: Pain limiting ability to participate  [place] Pager [phone] Office [phone]    Claretha Cooper 07/02/2021, 2:49 PM

## 2021-07-02 NOTE — Progress Notes (Signed)
PROGRESS NOTE    Natha Guin  EGB:[id number] DOB: Jun 20, 1945 DOA: 06/28/2021 PCP: [name], MD    Brief Narrative: This 76 year old [name] with PMH significant of lung cancer, history of breast cancer, new vertebral masses presents in the ER with several days history of worsening abdominal pain, and nausea. CT abdomen with IV contrast showed large intussusception of the cecum and distal ileum within the length of the ascending colon and subsequent dilatation of the ascending colon. There are also noted various and numerous lytic lesions throughout the thoracolumbar spine and pelvis consistent with metastatic disease. General surgery was consulted.  She underwent laparoscopic right colectomy with end ileostomy on 06/29/2021. She had a post-op cardiac arrest and inferior STEMI-- Cardiology consulted, Not a candidate for cardiac intervention.   Assessment & Plan:   Principal Problem:   Intussusception of cecum ([place]) Active Problems:   Small bowel intussusception ([place])  Cecal and distal ileum intussusception with bowel obstruction: Patient presented with severe abdominal pain associated with nausea. S/p laparoscopic right colectomy with end ileostomy 2/18 Leukocytosis improving Continue IV fluids and IV Zosyn. Appreciate general surgery , ileostomy working,  Monitor output to avoid dehydration electrolyte disturbance.  Inferior STEMI / Postop cardiac arrest Ischemic cardiomyopathy; Troponin trended up to 24,000.  Cardiology consulted. Patient is not a good candidate for left heart cath. TTE showed LV ejection fraction of 50%, the left ventricle demonstrated RWMA.  Mild LVH,  diastolic parameters indeterminate. Continue aspirin. Patient started on full dose heparin as cleared from general surgery. [place] cardiology consultation, started on Imdur.  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   Patient underwent CT-guided lung biopsy on 05/14/2021- results consistent of poorly differentiated malignancy with sarcomatoid and epithelioid features.   Continue as needed pain medications   Hypokalemia:  Replaced and resolved.   Hypomagnesemia:  Replaced and resolved.   History of GI bleed: Continue pantoprazole   Essential hypertension: Continue Imdur 30 mg daily,  Coreg 6.25 twice daily.  Hyponatremia Resolved.   Acute blood loss anemia -H&H dropped from 10.9-8.5.  Status post 1 unit blood transfusion on 2/19. Hemoglobin is improved, remains above 9.0   Aortic atherosclerosis: Noted on recent CT   Cystic mass of pancreas: Previous CA 19-9 level normal. Follow-up outpatient.   Foley in place?  From code/surgery--consider voiding trial.   Poor overall prognosis, palliative care consulted.  DVT prophylaxis: Heparin Code Status: Full code Family Communication: No family at bedside Disposition Plan:   Status is: Inpatient Remains inpatient appropriate because: Admitted for intussusception,  underwent laparoscopic colectomy and end ileostomy, postoperative course complicated by cardiac arrest, inferior wall MI , cardiology following, not a candidate for left heart cath.  Consultants:  EGD Cardiology  Procedures: Laparoscopic left right colectomy with end ileostomy  Antimicrobials:   Anti-infectives (From admission, onward)    Start     Dose/Rate Route Frequency Ordered Stop   06/28/21 2200  piperacillin-tazobactam (ZOSYN) IVPB 3.375 g        3.375 g 12.5 mL/hr over 240 Minutes Intravenous Every 8 hours 06/28/21 2120 07/04/21 1032        Subjective: Patient was seen and examined at bedside.  Overnight events noted.   Patient reports feeling better,  she reports having pain in the right leg which has been happening for few months. Denies any chest pain,  shortness of breath, dizziness, palpitations.  Objective: Vitals:   07/02/21 0600 07/02/21 0700 07/02/21 0735 07/02/21 1105  BP: 124/61 118/62    Pulse: 96 90    Resp: (!) 25 18    Temp:   98.2 F (36.8 C) (!) 97.3 F (36.3 C)  TempSrc:   Axillary Oral  SpO2: 97% 98%    Weight:        Intake/Output Summary (Last 24 hours) at 07/02/2021 1355 Last data filed at 07/02/2021 1109 Gross per 24 hour  Intake 1583.31 ml  Output 50 ml  Net 1533.31 ml   Filed Weights   06/30/21 0455 07/01/21 0414 07/02/21 0500  Weight: 55 kg 57.1 kg 58.6 kg    Examination:  General exam: Appears comfortable, not in any acute distress, deconditioned. Respiratory system: CTA bilaterally, respiratory effort normal, no accessory muscle use. Cardiovascular system: S1 & S2 heard, regular rate and rhythm, no murmur. Gastrointestinal system: Abdomen is soft, nontender, nondistended, colostomy noted bedside, BS+ Central nervous system: Alert and oriented X 3. No focal neurological deficits. Extremities: No edema, no cyanosis, no clubbing. Skin: No rashes, lesions or ulcers Psychiatry: Judgement and insight appear normal. Mood & affect appropriate.     Data Reviewed: I have personally reviewed following labs and imaging studies  CBC: Recent Labs  Lab 06/28/21 1644 06/28/21 1758 06/29/21 0623 06/29/21 1016 06/29/21 1340 06/30/21 0320 07/01/21 0245 07/01/21 1400 07/02/21 0350  WBC 21.4*  --  18.6*  --   --  17.6* 17.5* 20.7* 17.4*  NEUTROABS 19.6*  --  16.9*  --   --   --   --   --   --   HGB 9.3*   < > 8.1*   < > 9.2* 8.5* 8.4* 9.2* 9.6*  HCT 30.8*   < > 27.0*   < > 27.0* 28.0* 28.1* 31.0* 31.7*  MCV 82.4  --  83.9  --   --  83.6 84.6 85.4 84.3  PLT 383  --  333  --   --  302 298 345 367   < > = values in this interval not displayed.   Basic Metabolic Panel: Recent Labs  Lab 06/28/21 1644 06/28/21 1758 06/29/21 0623 06/29/21 1016 06/29/21 1340 06/30/21 0320 07/01/21 0245 07/02/21 0350   NA 130*   < > 133* 134* 132* 134* 133* 135  K 3.8   < > 3.2* 2.7* 3.1* 3.2* 3.3* 3.7  CL 95*   < > 100 99  --  101 107 107  CO2 23  --  23  --   --  22 22 21*  GLUCOSE 109*   < > 95 92  --  161* 120* 103*  BUN 8   < > 6* <3*  --  6* 8 10  CREATININE 0.69   < > 0.51 0.30*  --  0.52 0.55 0.52  CALCIUM 8.4*  --  7.8*  --   --  7.7* 7.2* 7.6*  MG  --   --  1.8  --   --  1.6* 2.2  --    < > = values in this interval not displayed.   GFR: Estimated Creatinine Clearance: 47.9 mL/min (by C-G formula based on SCr of 0.52 mg/dL). Liver Function Tests: Recent Labs  Lab 06/28/21 1644 06/29/21 0623  AST 24 17  ALT 8 9  ALKPHOS 165* 132*  BILITOT 1.9* 0.7  PROT 6.1* 5.3*  ALBUMIN 2.5* 2.1*   Recent Labs  Lab 06/28/21 1644  LIPASE 27   No results for input(s): AMMONIA in the last 168 hours. Coagulation Profile: Recent Labs  Lab 06/30/21 1443  INR 1.3*   Cardiac Enzymes: No results for input(s): CKTOTAL, CKMB, CKMBINDEX, TROPONINI in the last 168 hours. BNP (last 3 results) No results for input(s): PROBNP in the last 8760 hours. HbA1C: Recent Labs    06/30/21 0320  HGBA1C 5.3   CBG: No results for input(s): GLUCAP in the last 168 hours. Lipid Profile: Recent Labs    06/30/21 0320  CHOL 142  HDL 30*  LDLCALC 75  TRIG 183*  CHOLHDL 4.7   Thyroid Function Tests: No results for input(s): TSH, T4TOTAL, FREET4, T3FREE, THYROIDAB in the last 72 hours. Anemia Panel: No results for input(s): VITAMINB12, FOLATE, FERRITIN, TIBC, IRON, RETICCTPCT in the last 72 hours. Sepsis Labs: Recent Labs  Lab 06/28/21 2028 06/29/21 2878  LATICACIDVEN 1.2 0.8    Recent Results (from the past 240 hour(s))  Resp Panel by RT-PCR (Flu A&B, Covid) Nasopharyngeal Swab     Status: None   Collection Time: 06/28/21  8:00 PM   Specimen: Nasopharyngeal Swab; Nasopharyngeal(NP) swabs in vial transport medium  Result Value Ref Range Status   SARS Coronavirus 2 by RT PCR NEGATIVE NEGATIVE  Final    Comment: (NOTE) SARS-CoV-2 target nucleic acids are NOT DETECTED.  The SARS-CoV-2 RNA is generally detectable in upper respiratory specimens during the acute phase of infection. The lowest concentration of SARS-CoV-2 viral copies this assay can detect is 138 copies/mL. A negative result does not preclude SARS-Cov-2 infection and should not be used as the sole basis for treatment or other patient management decisions. A negative result may occur with  improper specimen collection/handling, submission of specimen other than nasopharyngeal swab, presence of viral mutation(s) within the areas targeted by this assay, and inadequate number of viral copies(<138 copies/mL). A negative result must be combined with clinical observations, patient history, and epidemiological information. The expected result is Negative.  Fact Sheet for Patients:  EntrepreneurPulse.com.au  Fact Sheet for Healthcare Providers:  IncredibleEmployment.be  This test is no t yet approved or cleared by the [place] FDA and  has been authorized for detection and/or diagnosis of SARS-CoV-2 by FDA under an Emergency Use Authorization (EUA). This EUA will remain  in effect (meaning this test can be used) for the duration of the COVID-19 declaration under Section 564(b)(1) of the Act, 21 U.S.C.section 360bbb-3(b)(1), unless the authorization is terminated  or revoked sooner.       Influenza A by PCR NEGATIVE NEGATIVE Final   Influenza B by PCR NEGATIVE NEGATIVE Final    Comment: (NOTE) The Xpert Xpress SARS-CoV-2/FLU/RSV plus assay is intended as an aid in the diagnosis of influenza from Nasopharyngeal swab specimens and should not be used as a sole basis for treatment. Nasal washings and aspirates are unacceptable for Xpert Xpress SARS-CoV-2/FLU/RSV testing.  Fact Sheet for Patients: EntrepreneurPulse.com.au  Fact Sheet for Healthcare  Providers: IncredibleEmployment.be  This test is not yet approved or cleared by the [place] FDA and has been authorized for detection and/or diagnosis of SARS-CoV-2 by FDA under an Emergency Use Authorization (EUA). This EUA will remain in effect (meaning this test can be used) for the duration of the COVID-19 declaration under Section 564(b)(1) of the Act, 21 U.S.C. section 360bbb-3(b)(1), unless the authorization is terminated or revoked.  Performed at [facility], [place] [address], [place], [place] [address]     Radiology Studies: No results found.  Scheduled Meds:  acetaminophen (TYLENOL) oral liquid 160 mg/5 mL  1,000 mg Oral Q6H   aspirin EC  81 mg Oral Daily   carvedilol  6.25 mg Oral BID WC   Chlorhexidine Gluconate Cloth  6 each Topical Daily   dicyclomine  10 mg Oral Once   ezetimibe  10 mg Oral Daily   feeding supplement  237 mL Oral BID BM   isosorbide mononitrate  30 mg Oral Daily   lidocaine  1 patch Transdermal Q24H   methocarbamol  500 mg Oral TID   pantoprazole (PROTONIX) IV  40 mg Intravenous Q24H   Continuous Infusions:  0.9 % NaCl with KCl 20 mEq / L 50 mL/hr at 07/02/21 1109   piperacillin-tazobactam (ZOSYN)  IV Stopped (07/02/21 0924)     LOS: 4 days    Time spent: 50 min    Makynlie Rossini, MD [facility]   If 7PM-7AM, please contact night-coverage

## 2021-07-03 DIAGNOSIS — Z452 Encounter for adjustment and management of vascular access device: Secondary | ICD-10-CM

## 2021-07-03 DIAGNOSIS — K561 Intussusception: Secondary | ICD-10-CM | POA: Diagnosis not present

## 2021-07-03 LAB — BPAM RBC
Blood Product Expiration Date: 202303032359
Blood Product Expiration Date: 202303042359
ISSUE DATE / TIME: 202302181104
ISSUE DATE / TIME: 202302181305
Unit Type and Rh: 7300
Unit Type and Rh: 7300

## 2021-07-03 LAB — CBC
HCT: 32.4 % — ABNORMAL LOW (ref 36.0–46.0)
Hemoglobin: 9.7 g/dL — ABNORMAL LOW (ref 12.0–15.0)
MCH: 25.7 pg — ABNORMAL LOW (ref 26.0–34.0)
MCHC: 29.9 g/dL — ABNORMAL LOW (ref 30.0–36.0)
MCV: 85.7 fL (ref 80.0–100.0)
Platelets: 349 10*3/uL (ref 150–400)
RBC: 3.78 MIL/uL — ABNORMAL LOW (ref 3.87–5.11)
RDW: 27.5 % — ABNORMAL HIGH (ref 11.5–15.5)
WBC: 16.6 10*3/uL — ABNORMAL HIGH (ref 4.0–10.5)
nRBC: 0 % (ref 0.0–0.2)

## 2021-07-03 LAB — BASIC METABOLIC PANEL
Anion gap: 4 — ABNORMAL LOW (ref 5–15)
BUN: 11 mg/dL (ref 8–23)
CO2: 20 mmol/L — ABNORMAL LOW (ref 22–32)
Calcium: 7.5 mg/dL — ABNORMAL LOW (ref 8.9–10.3)
Chloride: 110 mmol/L (ref 98–111)
Creatinine, Ser: 0.56 mg/dL (ref 0.44–1.00)
GFR, Estimated: >60 mL/min (ref >60)
Glucose, Bld: 125 mg/dL — ABNORMAL HIGH (ref 70–99)
Potassium: 3.4 mmol/L — ABNORMAL LOW (ref 3.5–5.1)
Sodium: 134 mmol/L — ABNORMAL LOW (ref 135–145)

## 2021-07-03 LAB — TYPE AND SCREEN
ABO/RH(D): B POS
Antibody Screen: NEGATIVE
Unit division: 0
Unit division: 0

## 2021-07-03 LAB — MAGNESIUM: Magnesium: 1.7 mg/dL (ref 1.7–2.4)

## 2021-07-03 LAB — SURGICAL PATHOLOGY

## 2021-07-03 MED ORDER — POTASSIUM CHLORIDE 10 MEQ/100ML IV SOLN: 10.0000 meq | INTRAVENOUS | Status: DC

## 2021-07-03 MED ORDER — ENOXAPARIN SODIUM 40 MG/0.4ML IJ SOSY
40.0000 mg | PREFILLED_SYRINGE | INTRAMUSCULAR | Status: DC
  Administered 2021-07-04: 40 mg via SUBCUTANEOUS
  Filled 2021-07-03 (×2): qty 0.4

## 2021-07-03 MED ORDER — OXYCODONE HCL 5 MG PO TABS
5.0000 mg | ORAL_TABLET | Freq: Four times a day (QID) | ORAL | Status: DC | PRN
  Administered 2021-07-07 – 2021-07-10 (×9): 5 mg via ORAL
  Filled 2021-07-03 (×10): qty 1

## 2021-07-03 MED ORDER — POTASSIUM CHLORIDE 20 MEQ PO PACK: 20.0000 meq | PACK | Freq: Once | ORAL | Status: DC

## 2021-07-03 MED ORDER — MAGNESIUM SULFATE 4 GM/100ML IV SOLN
4.0000 g | Freq: Once | INTRAVENOUS | Status: AC
  Administered 2021-07-03: 4 g via INTRAVENOUS
  Filled 2021-07-03: qty 100

## 2021-07-03 MED ORDER — ADULT MULTIVITAMIN W/MINERALS CH
1.0000 | ORAL_TABLET | Freq: Every day | ORAL | Status: DC
  Administered 2021-07-06 – 2021-07-19 (×10): 1 via ORAL
  Filled 2021-07-03 (×15): qty 1

## 2021-07-03 MED ORDER — DULOXETINE HCL 20 MG PO CPEP
20.0000 mg | ORAL_CAPSULE | Freq: Every day | ORAL | Status: DC
  Administered 2021-07-04 – 2021-07-24 (×17): 20 mg via ORAL
  Filled 2021-07-03 (×22): qty 1

## 2021-07-03 MED ORDER — BOOST / RESOURCE BREEZE PO LIQD CUSTOM
1.0000 | Freq: Two times a day (BID) | ORAL | Status: DC
  Administered 2021-07-04 – 2021-07-21 (×4): 1 via ORAL

## 2021-07-03 MED ORDER — POTASSIUM CHLORIDE 10 MEQ/100ML IV SOLN
10.0000 meq | INTRAVENOUS | Status: AC
  Administered 2021-07-03 (×6): 10 meq via INTRAVENOUS
  Filled 2021-07-03 (×6): qty 100

## 2021-07-03 NOTE — Progress Notes (Addendum)
[facility] Progress Note  4 Days Post-Op  Subjective: CC:  No acute change.   Objective: Vital signs in last 24 hours: Temp:  [97.3 F (36.3 C)-98.4 F (36.9 C)] 97.6 F (36.4 C) (02/22 0806) Pulse Rate:  [54-100] 92 (02/22 0600) Resp:  [18-30] 23 (02/22 0600) BP: (111-145)/(51-85) 127/51 (02/22 0600) SpO2:  [90 %-100 %] 98 % (02/22 0600) Weight:  [59.8 kg] 59.8 kg (02/22 0600) Last BM Date : 07/02/21  Intake/Output from previous day: 02/21 0701 - 02/22 0700 In: 1643.6 [I.V.:1483.4; IV Piggyback:160.2] Out: 500 [Urine:150; Stool:350] Intake/Output this shift: No intake/output data recorded.  PE: Gen:  Alert, NAD,  Card:  Regular rate and rhythm Pulm:  Normal effort Abd: Soft, appropriately tender, stoma pink, edematous and productive. Incisions intact, ostomy appliance is leaking onto midline incision. Skin: warm and dry, no rashes  Psych: A&Ox3   Lab Results:  Recent Labs    07/02/21 0350 07/03/21 0546  WBC 17.4* 16.6*  HGB 9.6* 9.7*  HCT 31.7* 32.4*  PLT 367 349    BMET Recent Labs    07/02/21 0350 07/03/21 0546  NA 135 134*  K 3.7 3.4*  CL 107 110  CO2 21* 20*  GLUCOSE 103* 125*  BUN 10 11  CREATININE 0.52 0.56  CALCIUM 7.6* 7.5*    PT/INR Recent Labs    06/30/21 1443  LABPROT 16.1*  INR 1.3*    CMP     Component Value Date/Time   NA 134 (L) 07/03/2021 0546   K 3.4 (L) 07/03/2021 0546   CL 110 07/03/2021 0546   CO2 20 (L) 07/03/2021 0546   GLUCOSE 125 (H) 07/03/2021 0546   BUN 11 07/03/2021 0546   CREATININE 0.56 07/03/2021 0546   CREATININE 0.86 11/09/2017 0831   CALCIUM 7.5 (L) 07/03/2021 0546   PROT 5.3 (L) 06/29/2021 0623   ALBUMIN 2.1 (L) 06/29/2021 0623   AST 17 06/29/2021 0623   AST 23 11/09/2017 0831   ALT 9 06/29/2021 0623   ALT 34 11/09/2017 0831   ALKPHOS 132 (H) 06/29/2021 0623   BILITOT 0.7 06/29/2021 0623   BILITOT 0.4 11/09/2017 0831   GFRNONAA >60 07/03/2021 0546   GFRNONAA >60 11/09/2017 0831    GFRAA >60 04/28/2018 1439   GFRAA >60 11/09/2017 0831   Lipase     Component Value Date/Time   LIPASE 27 06/28/2021 1644       Studies/Results: No results found.  Anti-infectives: Anti-infectives (From admission, onward)    Start     Dose/Rate Route Frequency Ordered Stop   06/28/21 2200  piperacillin-tazobactam (ZOSYN) IVPB 3.375 g        3.375 g 12.5 mL/hr over 240 Minutes Intravenous Every 8 hours 06/28/21 2120 07/04/21 1032        Assessment/Plan  Intussusception of cecum  S/P laparoscopic assisted partial R colectomy, end ileostomy 06/29/21 POD#6, AFVSS  - WBC 16 from 17 - ileostomy working, monitor output to avoid dehydration/electrolyte disturbance.  - PO pain control, currently using tylenol, add prn oxycodone, robaxin, dilaudid.  - tolerating PO but not eating much, doesn't like ensure, consulted RD for assistance with supplements.  - PT/OT - noted palliative consult note, full code, patients goal is to [facility] with [facility]  Path consistent with more metastatic disease, patient has known lung and sigmoid colon mets - A. 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      -  Satellite malignant nodules present.      - Proximal and distal resection margins, negative for malignant  neoplasm.      - Six out of seventeen lymph nodes positive for malignant neoplasm  (6/17).    FEN: carb mod, stop IVF. Hypokalemia, hypomagnesemia- replacement ordered ID: Zosyn, plan to stop after POD#5 VTE: SCD's, hep gtt for ACS stopped yesterday, start lovenox ppx Foley: out  Dispo: ICU   Cardiac arrest, inferior STEMI  Metastatic breast cancer  PMH GIB HTN Acute on chronic anemia   Patient tends to decline care from multiple members of the team including mobilizing, wound care, and ostomy teaching.  Responds well to a slightly more paternalistic approach-appreciate ongoing efforts from nursing, PT/OT/WOC.  Needs attention to ostomy pouching as appliance consistently leaks onto midline.    LOS: 5 days   [facility] Please see Amion to page floor coverage during day hours 7:00am-4:30pm

## 2021-07-03 NOTE — Plan of Care (Signed)
°  Problem: Nutrition: Goal: Adequate nutrition will be maintained Outcome: Not Progressing   Problem: Coping: Goal: Level of anxiety will decrease Outcome: Progressing   Problem: Pain Managment: Goal: General experience of comfort will improve Outcome: Not Progressing   Problem: Clinical Measurements: Goal: Respiratory complications will improve Outcome: Progressing   Problem: Nutrition: Goal: Adequate nutrition will be maintained Outcome: Not Progressing

## 2021-07-03 NOTE — Plan of Care (Signed)
°  Problem: Activity: Goal: Risk for activity intolerance will decrease Outcome: Progressing   Problem: Nutrition: Goal: Adequate nutrition will be maintained Outcome: Progressing   Problem: Coping: Goal: Level of anxiety will decrease Outcome: Progressing   Problem: Pain Managment: Goal: General experience of comfort will improve Outcome: Progressing   Problem: Education: Goal: Knowledge of General Education information will improve Description: Including pain rating scale, medication(s)/side effects and non-pharmacologic comfort measures Outcome: Progressing   Problem: Health Behavior/Discharge Planning: Goal: Ability to manage health-related needs will improve Outcome: Progressing   Problem: Clinical Measurements: Goal: Ability to maintain clinical measurements within normal limits will improve Outcome: Progressing Goal: Will remain free from infection Outcome: Progressing Goal: Diagnostic test results will improve Outcome: Progressing Goal: Respiratory complications will improve Outcome: Progressing Goal: Cardiovascular complication will be avoided Outcome: Progressing   Problem: Activity: Goal: Risk for activity intolerance will decrease Outcome: Progressing   Problem: Nutrition: Goal: Adequate nutrition will be maintained Outcome: Progressing   Problem: Coping: Goal: Level of anxiety will decrease Outcome: Progressing   Problem: Elimination: Goal: Will not experience complications related to bowel motility Outcome: Progressing Goal: Will not experience complications related to urinary retention Outcome: Progressing   Problem: Pain Managment: Goal: General experience of comfort will improve Outcome: Progressing   Problem: Safety: Goal: Ability to remain free from injury will improve Outcome: Progressing   Problem: Skin Integrity: Goal: Risk for impaired skin integrity will decrease Outcome: Progressing

## 2021-07-03 NOTE — Progress Notes (Signed)
Progress Note  Patient Name: Danaysia Rader Date of Encounter: 07/03/2021  [facility] Cardiologist: Jenkins Rouge, MD   Subjective   Patient has no CP No SOB No palpitations. She has refused her Coreg and heart heart medications. She notes that she has abdominal pain, that she was left in the chair for two hours and PT did not see her when she said she was. She notes a lot of abdominal pain.  Ultimately has a lot of concerns that that she is feeling quite poorly.  [place] interpreter (ERES) used.  Inpatient Medications    Scheduled Meds:  acetaminophen (TYLENOL) oral liquid 160 mg/5 mL  1,000 mg Oral Q6H   aspirin EC  81 mg Oral Daily   carvedilol  6.25 mg Oral BID WC   Chlorhexidine Gluconate Cloth  6 each Topical Daily   dicyclomine  10 mg Oral Once   DULoxetine  20 mg Oral Daily   enoxaparin (LOVENOX) injection  40 mg Subcutaneous Q24H   feeding supplement  1 Container Oral BID BM   isosorbide mononitrate  30 mg Oral Daily   lidocaine  1 patch Transdermal Q24H   methocarbamol  500 mg Oral TID   multivitamin with minerals  1 tablet Oral Daily   pantoprazole (PROTONIX) IV  40 mg Intravenous Q24H   Continuous Infusions:  piperacillin-tazobactam (ZOSYN)  IV Stopped (07/03/21 0953)   potassium chloride 10 mEq (07/03/21 1233)   PRN Meds: alum & mag hydroxide-simeth, dicyclomine, diphenhydrAMINE **OR** diphenhydrAMINE, HYDROmorphone (DILAUDID) injection, ondansetron **OR** ondansetron (ZOFRAN) IV, oxyCODONE, simethicone   Vital Signs    Vitals:   07/03/21 0400 07/03/21 0500 07/03/21 0600 07/03/21 0806  BP: 136/66 121/62 (!) 127/51   Pulse: 82 89 92   Resp: (!) 21 (!) 25 (!) 23   Temp:    97.6 F (36.4 C)  TempSrc:    Oral  SpO2: 96% 96% 98%   Weight:   59.8 kg     Intake/Output Summary (Last 24 hours) at 07/03/2021 1254 Last data filed at 07/03/2021 0956 Gross per 24 hour  Intake 1407.05 ml  Output 500 ml  Net 907.05 ml   Last 3 Weights 07/03/2021  07/02/2021 07/01/2021  Weight (lbs) 131 lb 13.4 oz 129 lb 3 oz 125 lb 14.1 oz  Weight (kg) 59.8 kg 58.6 kg 57.1 kg      Telemetry    Sinus rhythm and sinus tachycardia - Personally Reviewed  ECG    No new ECG tracing today. - Personally Reviewed  Physical Exam   GEN: Mild distress.   Neck: No JVD. Cardiac: regular tachycardia. No significant murmurs, rubs, or gallops appreciated. Radial and distal pedal pulses 2+ and equal bilaterally. Respiratory: Clear to auscultation anteriorly. GI: Soft, non-distended, Ostomy bag in place. MS: Trace to 1+ pitting edema of bilatearl lower extremity edema. No deformity. Skin: Warm and dry. Neuro:  No focal deficits. Psych: Distressed  affect. Responds appropriately.  Labs    High Sensitivity Troponin:   Recent Labs  Lab 06/29/21 1621 06/29/21 2004 06/30/21 0518  TROPONINIHS 952* 14,681* >24,000*     Chemistry Recent Labs  Lab 06/28/21 1644 06/28/21 1758 06/29/21 9833 06/29/21 1016 06/30/21 0320 07/01/21 0245 07/02/21 0350 07/03/21 0546  NA 130*   < > 133*   < > 134* 133* 135 134*  K 3.8   < > 3.2*   < > 3.2* 3.3* 3.7 3.4*  CL 95*   < > 100   < > 101 107 107  110  CO2 23  --  23  --  22 22 21* 20*  GLUCOSE 109*   < > 95   < > 161* 120* 103* 125*  BUN 8   < > 6*   < > 6* 8 10 11   CREATININE 0.69   < > 0.51   < > 0.52 0.55 0.52 0.56  CALCIUM 8.4*  --  7.8*  --  7.7* 7.2* 7.6* 7.5*  MG  --    < > 1.8  --  1.6* 2.2  --  1.7  PROT 6.1*  --  5.3*  --   --   --   --   --   ALBUMIN 2.5*  --  2.1*  --   --   --   --   --   AST 24  --  17  --   --   --   --   --   ALT 8  --  9  --   --   --   --   --   ALKPHOS 165*  --  132*  --   --   --   --   --   BILITOT 1.9*  --  0.7  --   --   --   --   --   GFRNONAA >60  --  >60  --  >60 >60 >60 >60  ANIONGAP 12  --  10  --  11 4* 7 4*   < > = values in this interval not displayed.    Lipids  Recent Labs  Lab 06/30/21 0320  CHOL 142  TRIG 183*  HDL 30*  LDLCALC 75  CHOLHDL 4.7     Hematology Recent Labs  Lab 07/01/21 1400 07/02/21 0350 07/03/21 0546  WBC 20.7* 17.4* 16.6*  RBC 3.63* 3.76* 3.78*  HGB 9.2* 9.6* 9.7*  HCT 31.0* 31.7* 32.4*  MCV 85.4 84.3 85.7  MCH 25.3* 25.5* 25.7*  MCHC 29.7* 30.3 29.9*  RDW 26.1* 26.5* 27.5*  PLT 345 367 349   Thyroid No results for input(s): TSH, FREET4 in the last 168 hours.  BNPNo results for input(s): BNP, PROBNP in the last 168 hours.  DDimer No results for input(s): DDIMER in the last 168 hours.   Radiology    No results found.  Cardiac Studies   Echocardiogram 06/30/2021: Impressions:  1. Mild inferoseptal hypokinesis. Left ventricular ejection fraction, by  estimation, is 50%. The left ventricle has low normal function. The left  ventricle demonstrates regional wall motion abnormalities (see scoring  diagram/findings for description).  There is mild left ventricular hypertrophy. Left ventricular diastolic  parameters are indeterminate. Elevated left atrial pressure.   2. Right ventricular systolic function is normal. The right ventricular  size is normal. Tricuspid regurgitation signal is inadequate for assessing  PA pressure.   3. The mitral valve is abnormal. Mild to moderate mitral valve  regurgitation. No evidence of mitral stenosis.   4. The tricuspid valve is abnormal.   5. The aortic valve has an indeterminant number of cusps. Aortic valve  regurgitation is mild. No aortic stenosis is present.   Patient Profile     76 y.o. female with a history of metastatic breast cancer with new vertebral masses, hypertension, and hyperlipidemia who presented on 06/28/2021 with worsening abdominal pain and nausea and found to have intussusception. She underwent laparoscopic right colectomy with end ileostomy on 06/29/2021. While in PACU, she had a VT arrest. ACLS was started  and ROSC was achieved in 90 seconds. Post code EKG showed inferior STEMI. Given metastatic cancer and abdominal surgery, patient treated  medically.  Assessment & Plan    VT Arrest Inferior STEMI Ischemic Cardiomyopathy HTN and HLD - Patient denies any chest pain. - has completed 48 hours of heparin - Continue Aspirin 81mg  daily and Imdur 30mg  daily. - if patient amenable will continue coreg - I have stopped zetia per patient request   Otherwise, per primary team: - Metastatic breast cancer: palliative care consulted - Intussusception s/p colectomy  - Hyponatremia - History of GI bleed - Cystic mass of pancreas  For questions or updates, please contact [facility] Please consult [email or web address] for contact info under       Rudean Haskell, [address] [place], [place] [address] [phone]  12:54 PM

## 2021-07-03 NOTE — Progress Notes (Signed)
Initial Nutrition Assessment RD working remotely.   DOCUMENTATION CODES:   Not applicable  INTERVENTION:  - will d/c Ensure Surgery. - will order Boost Breeze BID, each supplement provides 250 kcal and 9 grams of protein. - will order [employer] with 2% milk BID with meals, each supplement provides 280 kcal and 9 grams protein. - will order 1 tablet multivitamin with minerals daily. - complete NFPE when feasible.  - recommend family/visitors bring in foods that patient enjoys.  - could trial appetite stimulant--will communicate with MD regarding this.   - if PO intakes remain suboptimal and patient remains Full Code, recommend small bore NGT placement and initiation of nocturnal TF.   NUTRITION DIAGNOSIS:   Increased nutrient needs related to cancer and cancer related treatments, post-op healing as evidenced by estimated needs.  GOAL:   Patient will meet greater than or equal to 90% of their needs  MONITOR:   PO intake, Supplement acceptance, Labs, Weight trends  REASON FOR ASSESSMENT:   Consult Poor PO  ASSESSMENT:   76 year old female with medical history of lung cancer, hx of breast cancer, new vertebral masses, HTN, mitral valve prolapse, heart murmur, and tuberculosis. She presented to the ED due to several days of worsening abdominal pain and nausea. CT abdomen showed large intussusception of the cecum and distal ileum within the ascending colon, dilatation of the ascending colon, and various and numerous lytic lesions throughout the thoracolumbar spine and pelvis consistent with metastatic disease. General surgery was consulted.  She underwent laparoscopic right colectomy with end ileostomy on 06/29/2021. She had a post-op cardiac arrest and inferior STEMI-- Cardiology consulted, not a candidate for cardiac intervention.  Diet advanced to CLD on 2/18 evening and then to Regular on 2/19 after breakfast. No meal completion documentation available from  this admission.  Attempt to connect with RN via secure chat this AM unsuccessful.  Consult note states that patient is able to tolerate PO nutrition but she is not eating much, does not like Ensure, and request to aid with additional sources of calories and protein.  Patient was out of the room to XRT at the end of RD work day yesterday. RD working off campus today. Patient noted to need [place] interpreter.   She was assessed in person by a RD on 05/13/21 and video interpreter was used at that time. She reported poor appetite PTA that persisted during hospitalization. Patient was consuming Ensure at that time and was agreeable to having frequency increased from BID to TID.   Weight today is 132 lb and weight on 2/20 was 126 lb. She is noted to have non-pitting edema to BLE as documented in the edema section of flow sheet.   Weight on 05/09/21 was 148 lb. This indicates 16 lb weight loss (10.8% body weight) in the past 2 months; significant for time frame.   Palliative Care saw patient on 2/19. Code Status remains Full Code.   She is POD #6 laparoscopic-assisted partial R colectomy and end ileostomy d/t intussusception of cecum.  Surgeon's note from this AM indicates that patient's goal is to go home with Home Health. Note indicates that patient has been refusing care from multiple disciplines including mobilization team (PT/OT), wound care, and ostomy teaching. Note shares that patient responds slightly better to a paternalistic approach.    Labs reviewed; Na: 134 mmol/l, K: 3.4 mmol/l, Ca: 7.5 mg/dl. Medications reviewed; 4 g IV Mg sulfate x1 run 2/22, 10 mEq IV KCl x6 runs 2/22.  NUTRITION - FOCUSED PHYSICAL EXAM:  RD working remotely.  Diet Order:   Diet Order             Diet regular Room service appropriate? Yes; Fluid consistency: Thin  Diet effective now                   EDUCATION NEEDS:   No education needs have been identified at this time  Skin:  Skin  Assessment: Skin Integrity Issues: Skin Integrity Issues:: Incisions Incisions: abdomen (2/18)  Last BM:  2/22 (150 ml via ileostomy)  Height:   Ht Readings from Last 1 Encounters:  06/28/21 5' (1.524 m)    Weight:   Wt Readings from Last 1 Encounters:  07/03/21 59.8 kg     BMI:  Body mass index is 25.75 kg/m.  Estimated Nutritional Needs:  Kcal:  1900-2150 kcal Protein:  105-120 grams Fluid:  >/= 2.2 L/day      Jarome Matin, MS, RD, LDN Inpatient Clinical Dietitian RD pager # available in [place]  After hours/weekend pager # available in [facility]

## 2021-07-03 NOTE — Consult Note (Signed)
[place] Nurse ostomy follow up Stoma type/location: RLQ, end ileostomy  Stomal assessment/size: 1 1/2" round, budded, pink, moist Peristomal assessment: intact; stoma is literally 0.5 centimeter from the staple line Treatment options for stomal/peristomal skin:  Output liquid green stool  Ostomy pouching: 2pc. 2 3/4" with 2" skin barrier ring Education provided:  Using [place] interpreter on translation video service  Explained role of ostomy nurse and creation of stoma  Explained stoma characteristics (budded, flush, color, texture, care) Demonstrated pouch change (cutting new skin barrier, measuring stoma, cleaning peristomal skin and stoma, use of barrier ring) Education on emptying when 1/3 to 1/2 full and how to empty Demonstrated use of wick to clean spout  Patient is easily frustrated and limited in her engagement with my teaching.  She is mostly concerned about pain and pain control. She has a history of pain management issues. She reports she will go to her new apt. [place] alone. She has a son that I have left two messages with and I have not received a phone call back. She reluctantly did open and close the lock and roll closure; I did not allow her options on doing this. If she wants to go home will need to understand basis ostomy care.  I will focus education tomorrow on risk of dehydration and review education with patient.    Enrolled patient in [place] Start Discharge program: Yes [place] Nurse will follow along with you for continued support with ostomy teaching and care [place] MSN, [place], [place], [place], [place]

## 2021-07-03 NOTE — Evaluation (Signed)
Occupational Therapy Evaluation Patient Details Name: Robin Estrada MRN: [id number] DOB: 1946-05-10 Today's Date: 07/03/2021   History of Present Illness Patient is a 76 year old female presenting to ED with worsening abdominal pain, and nausea. CT abdomen showed large intussusception of the cecum and distal ileum. Pt s/p laparoscopic right colectomy with end ileostomy on 06/29/2021. Pt had post op cardiac arrest with STEMI. PMH: lung cancer, history of breast cancer, new vertebral masses   Clinical Impression    Patient lives in two story home with son and grandchildren. At baseline patient reports fully independent, does not use AD. Currently patient impacted by abdominal pain limiting her overall endurance, balance and safety. Patient needing HHA +1 to ambulate to/from bathroom, set up for peri care while seated on toilet and total A for lower body dressing to don socks. Patient needing encouragement to stay sitting up in recliner and RN notified. If patient's son can provide current assist levels anticipate patient can D/C home, acute OT to follow and will update D/C recommendations as necessary.      Recommendations for follow up therapy are one component of a multi-disciplinary discharge planning process, led by the attending physician.  Recommendations may be updated based on patient status, additional functional criteria and insurance authorization.   Follow Up Recommendations  Home health OT (if son can provide assist levels)    Assistance Recommended at Discharge Frequent or constant Supervision/Assistance  Patient can return home with the following A little help with walking and/or transfers;A lot of help with bathing/dressing/bathroom;Assistance with cooking/housework;Help with stairs or ramp for entrance;Assist for transportation    Functional Status Assessment  Patient has had a recent decline in their functional status and demonstrates the ability to make significant improvements  in function in a reasonable and predictable amount of time.  Equipment Recommendations  BSC/3in1       Precautions / Restrictions Precautions Precautions: Fall Precaution Comments: ostomy, abd surgery Restrictions Weight Bearing Restrictions: No      Mobility Bed Mobility Overal bed mobility: Needs Assistance Bed Mobility: Supine to Sit     Supine to sit: Min assist, HOB elevated     General bed mobility comments: Upright trunk        Balance Overall balance assessment: Needs assistance Sitting-balance support: Feet supported Sitting balance-Leahy Scale: Fair     Standing balance support: Single extremity supported Standing balance-Leahy Scale: Poor                             ADL either performed or assessed with clinical judgement   ADL Overall ADL's : Needs assistance/impaired     Grooming: Wash/dry hands;Set up;Sitting Grooming Details (indicate cue type and reason): Declined standing at sink "I can't stand that long" Upper Body Bathing: Minimal assistance;Sitting   Lower Body Bathing: Maximal assistance;Sitting/lateral leans;Sit to/from stand   Upper Body Dressing : Minimal assistance;Sitting   Lower Body Dressing: Total assistance;Sitting/lateral leans;Sit to/from stand Lower Body Dressing Details (indicate cue type and reason): Patient unable to bend forward to pull up socks due to abdominal pain Toilet Transfer: Minimal assistance;Ambulation;Cueing for safety;Cueing for sequencing;Grab bars Toilet Transfer Details (indicate cue type and reason): Patient needing hand held assist +1 to steady with ambulation to/from bathroom. Min A to power up to standing from toilet and cue to use grab bar Toileting- Clothing Manipulation and Hygiene: Set up;Sitting/lateral lean Toileting - Clothing Manipulation Details (indicate cue type and reason): For peri care after  voiding     Functional mobility during ADLs: Minimal assistance General ADL Comments:  Patient needing increased assistance with self care due to pain limiting overall activity tolerance, balance, safety awareness      Pertinent Vitals/Pain Pain Assessment Pain Assessment: Faces Faces Pain Scale: Hurts whole lot Pain Location: abdomen Pain Descriptors / Indicators: Grimacing, Guarding, Moaning Pain Intervention(s): Monitored during session     Hand Dominance  (did not specify)   Extremity/Trunk Assessment Upper Extremity Assessment Upper Extremity Assessment: Generalized weakness   Lower Extremity Assessment Lower Extremity Assessment: Defer to PT evaluation       Communication Communication Communication: Prefers language other than [place];Interpreter utilized   Cognition Arousal/Alertness: Awake/alert Behavior During Therapy: Flat affect Overall Cognitive Status: Difficult to assess                                 General Comments: patient needing max encouragement to sit up to chair                Home Living Family/patient expects to be discharged to:: Private residence Living Arrangements: Children;Other relatives Available Help at Discharge: Family Type of Home: House Home Access: Stairs to enter Technical brewer of Steps: 3 Entrance Stairs-Rails: None Home Layout: Two level Alternate Level Stairs-Number of Steps: 15 Alternate Level Stairs-Rails:  (rails on one side) Bathroom Shower/Tub: Teacher, early years/pre: Standard Bathroom Accessibility: Yes   Home Equipment: None          Prior Functioning/Environment Prior Level of Function : Independent/Modified Independent                        OT Problem List: Decreased strength;Decreased activity tolerance;Impaired balance (sitting and/or standing);Decreased safety awareness;Pain      OT Treatment/Interventions: Self-care/ADL training;Therapeutic exercise;Balance training;Patient/family education;Therapeutic activities;DME and/or AE  instruction;Energy conservation    OT Goals(Current goals can be found in the care plan section) Acute Rehab OT Goals Patient Stated Goal: Less pain OT Goal Formulation: With patient Time For Goal Achievement: 07/17/21 Potential to Achieve Goals: Good  OT Frequency: Min 2X/week    Co-evaluation PT/OT/SLP Co-Evaluation/Treatment: Yes Reason for Co-Treatment: To address functional/ADL transfers PT goals addressed during session: Mobility/safety with mobility OT goals addressed during session: ADL's and self-care      AM-PAC OT "6 Clicks" Daily Activity     Outcome Measure Help from another person eating meals?: None Help from another person taking care of personal grooming?: A Little Help from another person toileting, which includes using toliet, bedpan, or urinal?: A Little Help from another person bathing (including washing, rinsing, drying)?: A Lot Help from another person to put on and taking off regular upper body clothing?: A Little Help from another person to put on and taking off regular lower body clothing?: A Lot 6 Click Score: 17   End of Session Equipment Utilized During Treatment: Gait belt Nurse Communication: Mobility status  Activity Tolerance: Patient limited by pain Patient left: in chair;with call bell/phone within reach;with chair alarm set  OT Visit Diagnosis: Unsteadiness on feet (R26.81);Other abnormalities of gait and mobility (R26.89);Muscle weakness (generalized) (M62.81)                Time: 3244-0102 OT Time Calculation (min): 22 min Charges:  OT General Charges $OT Visit: 1 Visit OT Evaluation $OT Eval Low Complexity: [place] OT OT pager: [phone]  Eulah Citizen  Susy Frizzle 07/03/2021, 11:34 AM

## 2021-07-03 NOTE — Evaluation (Signed)
Physical Therapy Evaluation Patient Details Name: Robin Estrada MRN: [id number] DOB: [birth year] Today's Date: 07/03/2021  History of Present Illness  76 y.o. female presents to [facility] hospital on 05/02/2021 with complaints of weakness, DOE. 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 s/p bronchoscopy 12/27 with multiple areas biopsied however pathology came back unremarkable. Current plan is for ultrasound-guided biopsy by interventional radiology on Tuesday, 1/3. 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   Clinical Impression  Robin Estrada is 76 y.o. female admitted with above HPI and diagnosis. Patient is currently limited by functional impairments below (see PT problem list). Patient lives with her son and is independent at baseline. Currently pt is greatly limited by abdominal pain and weakness, she requires Mod assist for safety and to prevent LOB with transfers and gait. Patient will benefit from continued skilled PT interventions to address impairments and progress independence with mobility, recommending HHPT with constant supervision/assist from family; if unable to provide may need ST rehab at [facility] pending progress with mobiltiy. Acute PT will follow and progress as able.        Recommendations for follow up therapy are one component of a multi-disciplinary discharge planning process, led by the attending physician.  Recommendations may be updated based on patient status, additional functional criteria and insurance authorization.  Follow Up Recommendations Home health PT    Assistance Recommended at Discharge Frequent or constant Supervision/Assistance  Patient can return home with the following  A lot of help with walking and/or transfers;A lot of help with bathing/dressing/bathroom;Assistance with  cooking/housework;Direct supervision/assist for medications management;Help with stairs or ramp for entrance;Assist for transportation    Equipment Recommendations Rolling walker (2 wheels);BSC/3in1  Recommendations for Other Services       Functional Status Assessment Patient has had a recent decline in their functional status and demonstrates the ability to make significant improvements in function in a reasonable and predictable amount of time.     Precautions / Restrictions Precautions Precautions: Fall Precaution Comments: ostomy, abd surgery Restrictions Weight Bearing Restrictions: No      Mobility  Bed Mobility Overal bed mobility: Needs Assistance Bed Mobility: Supine to Sit     Supine to sit: Min assist, HOB elevated     General bed mobility comments: cues for use of bed rail, assist to reach and hold rail and to press trunkup to EOB.    Transfers Overall transfer level: Needs assistance Equipment used: 1 person hand held assist, 2 person hand held assist Transfers: Sit to/from Stand Sit to Stand: Min assist, +2 safety/equipment           General transfer comment: Min assist for power up from EOB, +2 for safety with rise and line management as pt moving impulsively at first.    Ambulation/Gait Ambulation/Gait assistance: Mod assist, +2 safety/equipment Gait Distance (Feet): 25 Feet Assistive device: 1 person hand held assist, 2 person hand held assist Gait Pattern/deviations: Decreased stride length, Trunk flexed, Step-through pattern Gait velocity: decr     General Gait Details: pt with flexed posture due to abdominal pain. pt requried HHA to steady due to staggering. pt moving quickly and demonstrated poor safety awareness negotiating obstacles like IV pole and edge of bed and doorway to bathroom.  Stairs            Wheelchair Mobility    Modified  Rankin (Stroke Patients Only)       Balance Overall balance assessment: Needs  assistance Sitting-balance support: Feet supported Sitting balance-Leahy Scale: Fair     Standing balance support: Single extremity supported Standing balance-Leahy Scale: Poor                               Pertinent Vitals/Pain Pain Assessment Pain Assessment: Faces Pain Score: 8  Pain Location: abdomen Pain Descriptors / Indicators: Grimacing, Guarding, Moaning Pain Intervention(s): Limited activity within patient's tolerance, Monitored during session, Repositioned    Home Living Family/patient expects to be discharged to:: Private residence Living Arrangements: Children;Other relatives Available Help at Discharge: Family Type of Home: House Home Access: Stairs to enter Entrance Stairs-Rails: None Entrance Stairs-Number of Steps: 3 Alternate Level Stairs-Number of Steps: 15 Home Layout: Two level Home Equipment: None      Prior Function Prior Level of Function : Independent/Modified Independent                     Hand Dominance   Dominant Hand:  (did not specify)    Extremity/Trunk Assessment   Upper Extremity Assessment Upper Extremity Assessment: Defer to OT evaluation    Lower Extremity Assessment Lower Extremity Assessment: Generalized weakness    Cervical / Trunk Assessment Cervical / Trunk Assessment: Normal;Other exceptions Cervical / Trunk Exceptions: flexed trunk due to abd pain  Communication   Communication: Prefers language other than [place];Interpreter utilized  Cognition Arousal/Alertness: Awake/alert Behavior During Therapy: Flat affect Overall Cognitive Status: Difficult to assess                                 General Comments: patient needing max encouragement to sit up to chair        General Comments      Exercises     Assessment/Plan    PT Assessment Patient needs continued PT services  PT Problem List Decreased strength;Decreased activity tolerance;Decreased balance;Decreased  mobility;Decreased knowledge of use of DME;Decreased safety awareness;Decreased knowledge of precautions;Pain       PT Treatment Interventions DME instruction;Gait training;Stair training;Functional mobility training;Therapeutic activities;Therapeutic exercise;Balance training;Patient/family education    PT Goals (Current goals can be found in the Care Plan section)  Acute Rehab PT Goals Patient Stated Goal: stop hurting and to rest PT Goal Formulation: With patient Time For Goal Achievement: 07/17/21 Potential to Achieve Goals: Fair    Frequency Min 3X/week     Co-evaluation   Reason for Co-Treatment: To address functional/ADL transfers PT goals addressed during session: Mobility/safety with mobility OT goals addressed during session: ADL's and self-care       AM-PAC PT "6 Clicks" Mobility  Outcome Measure Help needed turning from your back to your side while in a flat bed without using bedrails?: A Little Help needed moving from lying on your back to sitting on the side of a flat bed without using bedrails?: A Little Help needed moving to and from a bed to a chair (including a wheelchair)?: A Lot Help needed standing up from a chair using your arms (e.g., wheelchair or bedside chair)?: A Lot Help needed to walk in hospital room?: A Lot Help needed climbing 3-5 steps with a railing? : A Lot 6 Click Score: 14    End of Session Equipment Utilized During Treatment: Gait belt Activity Tolerance: Patient tolerated treatment well;Patient limited by pain Patient  left: in chair;with call bell/phone within reach;with chair alarm set Nurse Communication: Mobility status PT Visit Diagnosis: Muscle weakness (generalized) (M62.81);Difficulty in walking, not elsewhere classified (R26.2);Other abnormalities of gait and mobility (R26.89)    Time: 8937-3428 PT Time Calculation (min) (ACUTE ONLY): 31 min   Charges:   PT Evaluation $PT Eval Moderate Complexity: 1 Mod           Verner Mould, DPT Acute Rehabilitation Services Office [phone] Pager [phone]   Jacques Navy 07/03/2021, 1:24 PM

## 2021-07-03 NOTE — Progress Notes (Signed)
Progress Note   Patient: Robin Estrada LPF:[id number] DOB: 04/03/1946 DOA: 06/28/2021     5 DOS: the patient was seen and examined on 07/03/2021   Brief hospital course: 76 year old [name] with PMH significant of lung cancer, history of breast cancer, new vertebral masses presents in the ER with several days history of worsening abdominal pain, and nausea. CT abdomen with IV contrast showed large intussusception of the cecum and distal ileum within the length of the ascending colon and subsequent dilatation of the ascending colon. There are also noted various and numerous lytic lesions throughout the thoracolumbar spine and pelvis consistent with metastatic disease. General surgery was consulted.  She underwent laparoscopic right colectomy with end ileostomy on 06/29/2021. She had a post-op cardiac arrest and inferior STEMI-- Cardiology consulted, Not a candidate for cardiac intervention.   Assessment and Plan: Cecal and distal ileum intussusception with bowel obstruction: Patient presented with severe abdominal pain associated with nausea. S/p laparoscopic right colectomy with end ileostomy 2/18 Leukocytosis improving Continued on zosyn Appreciate general surgery , ileostomy working,  On regular diet   Inferior STEMI / Postop cardiac arrest Ischemic cardiomyopathy; Troponin peaked to 24,000.  Cardiology following Patient is not a good candidate for left heart cath. TTE showed LV ejection fraction of 50%, the left ventricle demonstrated RWMA.  Mild LVH,  diastolic parameters indeterminate. Continue aspirin. Initially on heparin gtt, since being stopped by Cardiology. 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   Patient underwent CT-guided lung biopsy on 05/14/2021- results consistent of poorly  differentiated malignancy with sarcomatoid and epithelioid features.   Continue as needed pain medications   Hypokalemia:  Replaced Repeat bmet in AM   Hypomagnesemia:  Replaced   History of GI bleed: Continue pantoprazole   Essential hypertension: Continue Imdur 30 mg daily,  Coreg 6.25 twice daily.   Hyponatremia improved   Acute blood loss anemia -H&H dropped from 10.9-8.5.  Status post 1 unit blood transfusion on 2/19. Hemoglobin is improved, remains above 9.0, stable   Aortic atherosclerosis: Noted on recent CT   Cystic mass of pancreas: Previous CA 19-9 level normal. Follow-up outpatient.   Foley removed   Poor overall prognosis, palliative care consulted.        Subjective: Without complaints this AM  Physical Exam: Vitals:   07/03/21 0400 07/03/21 0500 07/03/21 0600 07/03/21 0806  BP: 136/66 121/62 (!) 127/51   Pulse: 82 89 92   Resp: (!) 21 (!) 25 (!) 23   Temp:    97.6 F (36.4 C)  TempSrc:    Oral  SpO2: 96% 96% 98%   Weight:   59.8 kg    General exam: Awake, laying in bed, in nad Respiratory system: Normal respiratory effort, no wheezing Cardiovascular system: regular rate, s1, s2 Gastrointestinal system: Soft, nondistended, positive BS Central nervous system: CN2-12 grossly intact, strength intact Extremities: Perfused, no clubbing Skin: Normal skin turgor, no notable skin lesions seen Psychiatry: Mood normal // no visual hallucinations   Data Reviewed:  Labs reviewed, hypokalemic  Family Communication: Pt in room, family not at bedside  Disposition: Status is: Inpatient Remains inpatient appropriate because: Severity of illness      Planned Discharge Destination: Home and Home with Home Health      Author: Marylu Lund, MD 07/03/2021 12:07 PM  For on  call review [email or web address].
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
## 2021-07-04 ENCOUNTER — Inpatient Hospital Stay (HOSPITAL_COMMUNITY): Payer: Medicare HMO

## 2021-07-04 ENCOUNTER — Other Ambulatory Visit: Payer: Self-pay

## 2021-07-04 DIAGNOSIS — R609 Edema, unspecified: Secondary | ICD-10-CM

## 2021-07-04 DIAGNOSIS — K561 Intussusception: Secondary | ICD-10-CM | POA: Diagnosis not present

## 2021-07-04 DIAGNOSIS — Z452 Encounter for adjustment and management of vascular access device: Secondary | ICD-10-CM | POA: Diagnosis not present

## 2021-07-04 LAB — COMPREHENSIVE METABOLIC PANEL
ALT: 13 U/L (ref 0–44)
AST: 30 U/L (ref 15–41)
Albumin: 1.7 g/dL — ABNORMAL LOW (ref 3.5–5.0)
Alkaline Phosphatase: 180 U/L — ABNORMAL HIGH (ref 38–126)
Anion gap: 5 (ref 5–15)
BUN: 11 mg/dL (ref 8–23)
CO2: 19 mmol/L — ABNORMAL LOW (ref 22–32)
Calcium: 7.5 mg/dL — ABNORMAL LOW (ref 8.9–10.3)
Chloride: 108 mmol/L (ref 98–111)
Creatinine, Ser: 0.52 mg/dL (ref 0.44–1.00)
GFR, Estimated: >60 mL/min (ref >60)
Glucose, Bld: 131 mg/dL — ABNORMAL HIGH (ref 70–99)
Potassium: 4.1 mmol/L (ref 3.5–5.1)
Sodium: 132 mmol/L — ABNORMAL LOW (ref 135–145)
Total Bilirubin: 0.3 mg/dL (ref 0.3–1.2)
Total Protein: 4.8 g/dL — ABNORMAL LOW (ref 6.5–8.1)

## 2021-07-04 LAB — CBC
HCT: 31.1 % — ABNORMAL LOW (ref 36.0–46.0)
Hemoglobin: 9.4 g/dL — ABNORMAL LOW (ref 12.0–15.0)
MCH: 25.9 pg — ABNORMAL LOW (ref 26.0–34.0)
MCHC: 30.2 g/dL (ref 30.0–36.0)
MCV: 85.7 fL (ref 80.0–100.0)
Platelets: 315 10*3/uL (ref 150–400)
RBC: 3.63 MIL/uL — ABNORMAL LOW (ref 3.87–5.11)
RDW: 28.3 % — ABNORMAL HIGH (ref 11.5–15.5)
WBC: 18.1 10*3/uL — ABNORMAL HIGH (ref 4.0–10.5)
nRBC: 0 % (ref 0.0–0.2)

## 2021-07-04 MED ORDER — APIXABAN 5 MG PO TABS
5.0000 mg | ORAL_TABLET | Freq: Two times a day (BID) | ORAL | Status: DC
Start: 2021-07-11 — End: 2021-07-10
  Administered 2021-07-04: 5 mg via ORAL

## 2021-07-04 MED ORDER — SODIUM CHLORIDE 0.9% FLUSH
10.0000 mL | Freq: Two times a day (BID) | INTRAVENOUS | Status: DC
  Administered 2021-07-06: 10 mL

## 2021-07-04 MED ORDER — APIXABAN 5 MG PO TABS
10.0000 mg | ORAL_TABLET | Freq: Two times a day (BID) | ORAL | Status: DC
  Administered 2021-07-05 – 2021-07-09 (×10): 10 mg via ORAL
  Filled 2021-07-04 (×13): qty 2

## 2021-07-04 NOTE — Progress Notes (Signed)
[place] for Apixaban  Indication: DVT  Allergies  Allergen Reactions   Statins Nausea And Vomiting and Other (See Comments)    MUSCLE PAIN   Amlodipine     fatigue   Clonidine Derivatives     headache   Losartan     abd pain   Sulfa Antibiotics Nausea Only   Sulfamethoxazole-Trimethoprim Nausea Only    " Severe Nausea "    Patient Measurements: Height: 5" (152.4 cm) Weight: 59.8 kg (131 lb 13.4 oz)   Vital Signs: Temp: 98 F (36.7 C) (02/23 1346) Temp Source: Oral (02/23 1346) BP: 123/50 (02/23 1346) Pulse Rate: 101 (02/23 1346)  Labs: Recent Labs    07/01/21 1700 07/02/21 0350 07/02/21 0350 07/03/21 0546 07/04/21 0511  HGB  --  9.6*   < > 9.7* 9.4*  HCT  --  31.7*  --  32.4* 31.1*  PLT  --  367  --  349 315  HEPARINUNFRC 0.29* 0.50  --   --   --   CREATININE  --  0.52  --  0.56 0.52   < > = values in this interval not displayed.     Estimated Creatinine Clearance: 48.4 mL/min (by C-G formula based on SCr of 0.52 mg/dL).   Medical History: Past Medical History:  Diagnosis Date   Breast mass, left    Large   Cancer ([place])    FIBROMYOMA  AGE 38-   BENIGN  RESOLVED   GI bleed 05/03/2021   1/23 continue with pantoprazole   Headache    Heart murmur    Hypertension    Mitral valve prolapse    Tuberculosis    AS CHILD  W/ TX    Medications:  No anticoagulants PTA   Assessment: 76 y/o female with PMH of metastatic breast cancer who is post-op lap R colectomy, end ileostomy.  Experienced STEMI and cardiac arrest in PACU on 2/18. Started on Aspirin 81mg  daily and received 48 hours of IV heparin 2/19-2/21, then transitioned to Enoxaparin 40mg  SQ q24h for VTE prophylaxis (last dose today at 2956). Bilateral lower extremity venous duplex today + for LLE DVT. Pharmacy consulted for Apixaban dosing. Hgb low, but stable at 9.4. Pltc WNL.    Plan:  This evening at 1900, start Apixaban 10mg  PO BID x 7 days, then 5mg  PO  BID thereafter Monitor CBC and for s/sx of bleeding Pharmacy to provide education prior to discharge   Lindell Spar, PharmD, BCPS Clinical Pharmacist  07/04/2021 3:39 PM

## 2021-07-04 NOTE — Progress Notes (Addendum)
Progress Note  Patient Name: Robin Estrada Date of Encounter: 07/04/2021  [facility] Cardiologist: Jenkins Rouge, MD   Subjective   Interview conducted with the help of a [place] interpreter. Patient denies any chest pain, SOB, palpitations. Refused her heart medications yesterday because she had leg pain that was worrying her so she did not want to take medications. Patient agreeable to taking her medications today as she is no longer in pain. Continues to have some abdominal pain.   Inpatient Medications    Scheduled Meds:  acetaminophen (TYLENOL) oral liquid 160 mg/5 mL  1,000 mg Oral Q6H   aspirin EC  81 mg Oral Daily   carvedilol  6.25 mg Oral BID WC   Chlorhexidine Gluconate Cloth  6 each Topical Daily   DULoxetine  20 mg Oral Daily   enoxaparin (LOVENOX) injection  40 mg Subcutaneous Q24H   feeding supplement  1 Container Oral BID BM   isosorbide mononitrate  30 mg Oral Daily   lidocaine  1 patch Transdermal Q24H   methocarbamol  500 mg Oral TID   multivitamin with minerals  1 tablet Oral Daily   pantoprazole (PROTONIX) IV  40 mg Intravenous Q24H   sodium chloride flush  10-40 mL Intracatheter Q12H   Continuous Infusions:  PRN Meds: alum & mag hydroxide-simeth, dicyclomine, diphenhydrAMINE **OR** diphenhydrAMINE, HYDROmorphone (DILAUDID) injection, ondansetron **OR** ondansetron (ZOFRAN) IV, oxyCODONE, simethicone   Vital Signs    Vitals:   07/03/21 2000 07/03/21 2100 07/03/21 2113 07/04/21 0458  BP: 124/60  (!) 114/55 125/64  Pulse: (!) 108  92 (!) 110  Resp: (!) 27 18 20 20   Temp:   (!) 97.4 F (36.3 C) 97.6 F (36.4 C)  TempSrc:      SpO2: 99%  100% 97%  Weight:        Intake/Output Summary (Last 24 hours) at 07/04/2021 0933 Last data filed at 07/04/2021 0450 Gross per 24 hour  Intake 863.8 ml  Output 325 ml  Net 538.8 ml   Last 3 Weights 07/03/2021 07/02/2021 07/01/2021  Weight (lbs) 131 lb 13.4 oz 129 lb 3 oz 125 lb 14.1 oz  Weight (kg) 59.8 kg  58.6 kg 57.1 kg      Telemetry    Sinus rhythm, occasional sinus tachycardia - Personally Reviewed  ECG    No new tracings - Personally Reviewed  Physical Exam   GEN: No acute distress, resting comfortably in the bed. Chronically ill appearing  Neck: No JVD Cardiac: RRR, no murmurs, rubs, or gallops. Radial pulses 2+ bilaterally  Respiratory: Clear to auscultation anteriorly. No increased WOB.  GI: Soft, non-distended. Mildly tender to palpation, ostomy bag in place   MS: No edema Neuro:  Nonfocal  Psych: Normal affect, fatigued    Labs    High Sensitivity Troponin:   Recent Labs  Lab 06/29/21 1621 06/29/21 2004 06/30/21 0518  TROPONINIHS 952* 14,681* >24,000*     Chemistry Recent Labs  Lab 06/28/21 1644 06/28/21 1758 06/29/21 0623 06/29/21 1016 06/30/21 0320 07/01/21 0245 07/02/21 0350 07/03/21 0546 07/04/21 0511  NA 130*   < > 133*   < > 134* 133* 135 134* 132*  K 3.8   < > 3.2*   < > 3.2* 3.3* 3.7 3.4* 4.1  CL 95*   < > 100   < > 101 107 107 110 108  CO2 23  --  23  --  22 22 21* 20* 19*  GLUCOSE 109*   < > 95   < >  161* 120* 103* 125* 131*  BUN 8   < > 6*   < > 6* 8 10 11 11   CREATININE 0.69   < > 0.51   < > 0.52 0.55 0.52 0.56 0.52  CALCIUM 8.4*  --  7.8*  --  7.7* 7.2* 7.6* 7.5* 7.5*  MG  --    < > 1.8  --  1.6* 2.2  --  1.7  --   PROT 6.1*  --  5.3*  --   --   --   --   --  4.8*  ALBUMIN 2.5*  --  2.1*  --   --   --   --   --  1.7*  AST 24  --  17  --   --   --   --   --  30  ALT 8  --  9  --   --   --   --   --  13  ALKPHOS 165*  --  132*  --   --   --   --   --  180*  BILITOT 1.9*  --  0.7  --   --   --   --   --  0.3  GFRNONAA >60  --  >60  --  >60 >60 >60 >60 >60  ANIONGAP 12  --  10  --  11 4* 7 4* 5   < > = values in this interval not displayed.    Lipids  Recent Labs  Lab 06/30/21 0320  CHOL 142  TRIG 183*  HDL 30*  LDLCALC 75  CHOLHDL 4.7    Hematology Recent Labs  Lab 07/02/21 0350 07/03/21 0546 07/04/21 0511  WBC 17.4*  16.6* 18.1*  RBC 3.76* 3.78* 3.63*  HGB 9.6* 9.7* 9.4*  HCT 31.7* 32.4* 31.1*  MCV 84.3 85.7 85.7  MCH 25.5* 25.7* 25.9*  MCHC 30.3 29.9* 30.2  RDW 26.5* 27.5* 28.3*  PLT 367 349 315   Thyroid No results for input(s): TSH, FREET4 in the last 168 hours.  BNPNo results for input(s): BNP, PROBNP in the last 168 hours.  DDimer No results for input(s): DDIMER in the last 168 hours.   Radiology    No results found.  Cardiac Studies   Echocardiogram 06/30/2021   1. Mild inferoseptal hypokinesis. Left ventricular ejection fraction, by  estimation, is 50%. The left ventricle has low normal function. The left  ventricle demonstrates regional wall motion abnormalities (see scoring  diagram/findings for description).  There is mild left ventricular hypertrophy. Left ventricular diastolic  parameters are indeterminate. Elevated left atrial pressure.   2. Right ventricular systolic function is normal. The right ventricular  size is normal. Tricuspid regurgitation signal is inadequate for assessing  PA pressure.   3. The mitral valve is abnormal. Mild to moderate mitral valve  regurgitation. No evidence of mitral stenosis.   4. The tricuspid valve is abnormal.   5. The aortic valve has an indeterminant number of cusps. Aortic valve  regurgitation is mild. No aortic stenosis is present.   Patient Profile     76 y.o. female with a history of metastatic breast cancer with new vertebral masses, hypertension, and hyperlipidemia who presented on 06/28/2021 with worsening abdominal pain and nausea and found to have intussusception. She underwent laparoscopic right colectomy with end ileostomy on 06/29/2021. While in PACU, she had a VT arrest. ACLS was started and ROSC was achieved in 90 seconds. Post code EKG showed inferior  STEMI. Given metastatic cancer and abdominal surgery, patient treated medically.  Assessment & Plan    VT Arrest  Inferior STEMI  Ischemic Cardiomyopathy  HTN, HLD  -  Patient had VT arrest while in the PACU on 2/18. Chest compressions and defibrillated x1, received epi. ROSC in 90 sec. Case discussed with IC. Patient not a candidate for cath due to metastatic cancer s/p recent colectomy. HSTN peaked at >24,000 - Completed 48 hours IV heparin on 2/21 - Denies any chest pain  - Recommend continuing daily ASA, coreg 6.25 mg BID, imdur 30 mg daily. Per chart review, patient has been refusing her heart medication for the past few days. Discussed with patient today, reports she has not been taking her medications because she had leg pain that was worrying her. She is agreeable to taking medications today  - Intolerant of statins, stopped zetia at patient's request. Not currently on any cholesterol medications - Outpatient cardiology follow up is arranged     Otherwise, per primary team: - Metastatic breast cancer: palliative care consulted - Intussusception s/p colectomy  - Hyponatremia - History of GI bleed - Cystic mass of pancreas      For questions or updates, please contact [facility] Please consult [email or web address] for contact info under        Signed, Margie Billet, PA-C  07/04/2021, 9:33 AM     Personally seen and examined. Agree with APP above with the following comments: Briefly 76 yo F with metastatic cancer and VF arrest with medically managed STEMI post op. Patient notes that she has no heart pains, no chest pain.  No palpitations.  She has refused most of her cardiac medications and request that since her heart is fine she wants minimal medications. Interpretor Service (OLGA) used. Exam notable for sinus tachycardia.  No bleeding from ostomy site. Labs notable for stable creatinine Personally reviewed relevant tests; new tachycardia with leaving ICU and stopping her coreg Would recommend  - Given her metastatic cancer we will do our best to honor her request despite her recent STEMI and VF arrest - she completed 48 hours of  heparin - she is on ASA 81 mg and low dose COREG - she has refused statin, zetia, and imdur; still have no CP - this above can be-readdressed based on her [place] will sign off.   Medication Recommendations:  ASA and BB as above Other recommendations (labs, testing, etc):  NA Follow up as an outpatient:  07/18/21  Rudean Haskell, MD [place]  [address] [place], [place] [address] [phone]  12:19 PM

## 2021-07-04 NOTE — Progress Notes (Signed)
Bilateral lower extremity venous duplex completed.  Refer to "CV Proc" under chart review to view preliminary results.  Preliminary results discussed with Richard Miu, PA-C.  07/04/2021 12:06 PM Kelby Aline., MHA, RVT, RDCS, RDMS

## 2021-07-04 NOTE — Consult Note (Addendum)
[place] Nurse ostomy follow up Stoma type/location: RLQ, end ileostomy  Stomal assessment/size: 1 1/2" pink, moist, budded Peristomal assessment: NA Treatment options for stomal/peristomal skin: NA Output liquid green  Ostomy pouching: 2pc. 2 3/4" pouch and 2" skin barrier ring Education provided: patient turned [place] away today I will attempt to return to see patient in about 49mins.  Enrolled patient in [facility] Discharge program: Yes  Yennifer Segovia [facility], CNS, Lucillie Garfinkel [phone]   Addendum: returned to visit with patient, using language interpreter education provided with patient  Education on emptying when 1/3 to 1/2 full and how to empty; patient's pouch was almost completely full, emptied with patient watching and demonstrated use of wick. 350cc green liquid stool emptied  Discussed bathing, diet, gas, medication use, constipation; will reach out to attending or primary care to make sure if any of her home meds are XR to consider changing form Patient opened and closed lock and roll closure.  She is much more engaged today and pain seems to much better controlled.   [place], [place], [place]

## 2021-07-04 NOTE — Discharge Instructions (Addendum)
Information on my medicine - ELIQUIS (apixaban)  Why was Eliquis prescribed for you? Eliquis was prescribed to treat blood clots that may have been found in the veins of your legs (deep vein thrombosis) or in your lungs (pulmonary embolism) and to reduce the risk of them occurring again.  What do You need to know about Eliquis ? The starting dose is 10 mg (two 5 mg tablets) taken TWICE daily for the FIRST SEVEN (7) DAYS, then on 07/11/2021 PM, the dose is reduced to ONE 5 mg tablet taken TWICE daily.  Eliquis may be taken with or without food.   Try to take the dose about the same time in the morning and in the evening. If you have difficulty swallowing the tablet whole please discuss with your pharmacist how to take the medication safely.  Take Eliquis exactly as prescribed and DO NOT stop taking Eliquis without talking to the doctor who prescribed the medication.  Stopping may increase your risk of developing a new blood clot.  Refill your prescription before you run out.  After discharge, you should have regular check-up appointments with your healthcare provider that is prescribing your Eliquis.    What do you do if you miss a dose? If a dose of ELIQUIS is not taken at the scheduled time, take it as soon as possible on the same day and twice-daily administration should be resumed. The dose should not be doubled to make up for a missed dose.  Important Safety Information A possible side effect of Eliquis is bleeding. You should call your healthcare provider right away if you experience any of the following: Bleeding from an injury or your nose that does not stop. Unusual colored urine (red or dark brown) or unusual colored stools (red or black). Unusual bruising for unknown reasons. A serious fall or if you hit your head (even if there is no bleeding).  Some medicines may interact with Eliquis and might increase your risk of bleeding or clotting while on Eliquis. To help avoid  this, consult your healthcare provider or pharmacist prior to using any new prescription or non-prescription medications, including herbals, vitamins, non-steroidal anti-inflammatory drugs (NSAIDs) and supplements.  This website has more information on Eliquis (apixaban): http://www.eliquis.com/eliquis/home

## 2021-07-04 NOTE — Progress Notes (Signed)
Progress Note   Patient: Robin Estrada DJS:[id number] DOB: 1945-08-02 DOA: 06/28/2021     6 DOS: the patient was seen and examined on 07/04/2021   Brief hospital course: 76 year old [name] with PMH significant of lung cancer, history of breast cancer, new vertebral masses presents in the ER with several days history of worsening abdominal pain, and nausea. CT abdomen with IV contrast showed large intussusception of the cecum and distal ileum within the length of the ascending colon and subsequent dilatation of the ascending colon. There are also noted various and numerous lytic lesions throughout the thoracolumbar spine and pelvis consistent with metastatic disease. General surgery was consulted.  She underwent laparoscopic right colectomy with end ileostomy on 06/29/2021. She had a post-op cardiac arrest and inferior STEMI-- Cardiology consulted, Not a candidate for cardiac intervention.   Assessment and Plan: Cecal and distal ileum intussusception with bowel obstruction: Patient presented with severe abdominal pain associated with nausea. S/p laparoscopic right colectomy with end ileostomy 2/18 Leukocytosis stable Completed course of zosyn Appreciate general surgery , ileostomy working,  Now on regular diet   Inferior STEMI / Postop cardiac arrest Ischemic cardiomyopathy; Troponin peaked to 24,000.  Cardiology following Patient is not a good candidate for left heart cath. TTE showed LV ejection fraction of 50%, the left ventricle demonstrated RWMA.  Mild LVH,  diastolic parameters indeterminate. Continue aspirin. Initially on heparin gtt, since being stopped by Cardiology. 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   Patient underwent CT-guided lung biopsy  on 05/14/2021- results consistent of poorly differentiated malignancy with sarcomatoid and epithelioid features.   Continue as needed pain medications   Hypokalemia:  Replaced Repeat bmet in AM   Hypomagnesemia:  Replaced   History of GI bleed: Continue pantoprazole   Essential hypertension: Noted to have refused imdur Coreg 6.25 twice daily.   Hyponatremia improved   Acute blood loss anemia -H&H dropped from 10.9-8.5.  Status post 1 unit blood transfusion on 2/19. Hemoglobin is improved, remains above 9.0, stable   Aortic atherosclerosis: Noted on recent CT   Cystic mass of pancreas: Previous CA 19-9 level normal. Follow-up outpatient.  Acute LLE DVT -Noted and reviewed on LE dopplers -Discussed with General Surgery. OK for anticoagulation -Was recently on heparin gtt per above. Have since ordered eliquis. Have discussed with Oncology   Foley removed   Poor overall prognosis, palliative care consulted.        Subjective: Reports no complaints this AM. States feeling mildly better  Physical Exam: Vitals:   07/03/21 2113 07/04/21 0458 07/04/21 1343 07/04/21 1346  BP: (!) 114/55 125/64 124/74 (!) 123/50  Pulse: 92 (!) 110 98 (!) 101  Resp: 20 20 16 16   Temp: (!) 97.4 F (36.3 C) 97.6 F (36.4 C) 98 F (36.7 C) 98 F (36.7 C)  TempSrc:   Oral Oral  SpO2: 100% 97% 99% 99%  Weight:       General exam: Conversant, in no acute distress Respiratory system: normal chest rise, clear, no audible wheezing Cardiovascular system: regular rhythm, s1-s2 Gastrointestinal system: Nondistended, nontender, pos BS Central nervous system: No seizures, no tremors Extremities: No cyanosis, no joint deformities Skin: No rashes, no pallor Psychiatry: Affect normal // no auditory hallucinations   Data Reviewed:  Labs reviewed, hypokalemic  Family Communication: Pt in room, family not at bedside  Disposition: Status is: Inpatient Remains inpatient appropriate because:  Severity of illness   Planned Discharge Destination: Home and Home with Home Health     Author: Marylu Lund, MD 07/04/2021 3:48 PM  For on call review [email or web address].

## 2021-07-04 NOTE — Progress Notes (Addendum)
[facility] Progress Note  5 Days Post-Op  Subjective: States she slept poorly last night due to pain in her right leg. Pain has been intermittently present since prior to admission and is worsened with movement. No nausea, emesis or significant abdominal pain. Tolerating diet but admits to having low appetite  Objective: Vital signs in last 24 hours: Temp:  [97.4 F (36.3 C)-97.8 F (36.6 C)] 97.6 F (36.4 C) (02/23 0458) Pulse Rate:  [76-115] 110 (02/23 0458) Resp:  [16-27] 20 (02/23 0458) BP: (97-141)/(42-78) 125/64 (02/23 0458) SpO2:  [96 %-100 %] 97 % (02/23 0458) Last BM Date : 07/03/21  Intake/Output from previous day: 02/22 0701 - 02/23 0700 In: 863.8 [I.V.:159.1; IV Piggyback:704.7] Out: 325 [Urine:75; Stool:250] Intake/Output this shift: No intake/output data recorded.  PE: Gen:  Alert, NAD,  Card:  Regular rate and rhythm Pulm:  Normal effort Abd: Soft, appropriately tender, stoma pink, edematous and productive with liquid stool. Incisions intact with staples. No active leaking of ostomy at this time MSK: Calves soft NT and no edema bilaterally Skin: warm and dry, no rashes  Psych: A&Ox3   Lab Results:  Recent Labs    07/03/21 0546 07/04/21 0511  WBC 16.6* 18.1*  HGB 9.7* 9.4*  HCT 32.4* 31.1*  PLT 349 315    BMET Recent Labs    07/03/21 0546 07/04/21 0511  NA 134* 132*  K 3.4* 4.1  CL 110 108  CO2 20* 19*  GLUCOSE 125* 131*  BUN 11 11  CREATININE 0.56 0.52  CALCIUM 7.5* 7.5*    PT/INR No results for input(s): LABPROT, INR in the last 72 hours.  CMP     Component Value Date/Time   NA 132 (L) 07/04/2021 0511   K 4.1 07/04/2021 0511   CL 108 07/04/2021 0511   CO2 19 (L) 07/04/2021 0511   GLUCOSE 131 (H) 07/04/2021 0511   BUN 11 07/04/2021 0511   CREATININE 0.52 07/04/2021 0511   CREATININE 0.86 11/09/2017 0831   CALCIUM 7.5 (L) 07/04/2021 0511   PROT 4.8 (L) 07/04/2021 0511   ALBUMIN 1.7 (L) 07/04/2021 0511   AST 30  07/04/2021 0511   AST 23 11/09/2017 0831   ALT 13 07/04/2021 0511   ALT 34 11/09/2017 0831   ALKPHOS 180 (H) 07/04/2021 0511   BILITOT 0.3 07/04/2021 0511   BILITOT 0.4 11/09/2017 0831   GFRNONAA >60 07/04/2021 0511   GFRNONAA >60 11/09/2017 0831   GFRAA >60 04/28/2018 1439   GFRAA >60 11/09/2017 0831   Lipase     Component Value Date/Time   LIPASE 27 06/28/2021 1644       Studies/Results: No results found.  Anti-infectives: Anti-infectives (From admission, onward)    Start     Dose/Rate Route Frequency Ordered Stop   06/28/21 2200  piperacillin-tazobactam (ZOSYN) IVPB 3.375 g        3.375 g 12.5 mL/hr over 240 Minutes Intravenous Every 8 hours 06/28/21 2120 07/04/21 1032        Assessment/Plan  Intussusception of cecum  S/P laparoscopic assisted partial R colectomy, end ileostomy 06/29/21 Dr. Marcello Moores POD#5, AFVSS  - WBC 18.1 from 16.6 - ileostomy working, monitor output to avoid dehydration/electrolyte disturbance.  - PO pain control, currently using tylenol, prn oxycodone, robaxin, dilaudid.  - tolerating PO but not eating much, RD consulted for assistance with nutrition - PT/OT - noted palliative consult note, full code, patients goal is to go home with [facility]  - Path consistent with more metastatic disease,  patient has known lung and sigmoid colon mets - discussed with patient this am  Given increase in WBC and calf pain will get [place]. Consider further evaluation with CT scan if WBC continues to increase  FEN: carb mod.  ID: Zosyn, plan to stop after POD#5  VTE: SCD's, hep gtt for ACS stopped, start lovenox ppx Foley: out  Dispo: ICU   Cardiac arrest, inferior STEMI  Metastatic breast cancer  PMH GIB HTN Acute on chronic anemia   Patient tends to decline care from multiple members of the team including mobilizing, wound care, and ostomy teaching.  Responds well to a slightly more paternalistic approach-appreciate ongoing efforts from nursing, PT/OT/WOC. Needs  attention to ostomy pouching as appliance consistently leaks onto midline.    LOS: 6 days   [facility] 07/04/2021, 9:42 AM Please see Amion for pager number during day hours 7:00am-4:30pm

## 2021-07-04 NOTE — Care Management Important Message (Signed)
Important Message  Patient Details IM Letter placed in Patients room Name: Robin Estrada MRN: [id number] Date of Birth: Jan 25, 1946   Medicare Important Message Given:  Yes     Kerin Salen 07/04/2021, 12:34 PM

## 2021-07-05 ENCOUNTER — Other Ambulatory Visit: Payer: Medicare HMO

## 2021-07-05 ENCOUNTER — Inpatient Hospital Stay: Payer: Medicare HMO | Admitting: Hematology

## 2021-07-05 DIAGNOSIS — K561 Intussusception: Secondary | ICD-10-CM | POA: Diagnosis not present

## 2021-07-05 DIAGNOSIS — Z452 Encounter for adjustment and management of vascular access device: Secondary | ICD-10-CM | POA: Diagnosis not present

## 2021-07-05 LAB — BASIC METABOLIC PANEL
Anion gap: 4 — ABNORMAL LOW (ref 5–15)
BUN: 12 mg/dL (ref 8–23)
CO2: 21 mmol/L — ABNORMAL LOW (ref 22–32)
Calcium: 7.6 mg/dL — ABNORMAL LOW (ref 8.9–10.3)
Chloride: 110 mmol/L (ref 98–111)
Creatinine, Ser: 0.49 mg/dL (ref 0.44–1.00)
GFR, Estimated: >60 mL/min (ref >60)
Glucose, Bld: 116 mg/dL — ABNORMAL HIGH (ref 70–99)
Potassium: 4.2 mmol/L (ref 3.5–5.1)
Sodium: 135 mmol/L (ref 135–145)

## 2021-07-05 LAB — CBC
HCT: 31.6 % — ABNORMAL LOW (ref 36.0–46.0)
Hemoglobin: 9.4 g/dL — ABNORMAL LOW (ref 12.0–15.0)
MCH: 26.1 pg (ref 26.0–34.0)
MCHC: 29.7 g/dL — ABNORMAL LOW (ref 30.0–36.0)
MCV: 87.8 fL (ref 80.0–100.0)
Platelets: 345 10*3/uL (ref 150–400)
RBC: 3.6 MIL/uL — ABNORMAL LOW (ref 3.87–5.11)
RDW: 28.5 % — ABNORMAL HIGH (ref 11.5–15.5)
WBC: 21.8 10*3/uL — ABNORMAL HIGH (ref 4.0–10.5)
nRBC: 0 % (ref 0.0–0.2)

## 2021-07-05 LAB — OSMOLALITY: Osmolality: 281 mOsm/kg (ref 275–295)

## 2021-07-05 LAB — MAGNESIUM: Magnesium: 2.1 mg/dL (ref 1.7–2.4)

## 2021-07-05 NOTE — Progress Notes (Signed)
Physical Therapy Treatment Patient Details Name: Robin Estrada MRN: [id number] DOB: 1946/04/22 Today's Date: 07/05/2021   History of Present Illness 76 year old [name] with PMH significant of lung cancer, history of breast cancer, new vertebral masses presents in the ER with several days history of worsening abdominal pain, and nausea. CT abdomen with IV contrast showed large intussusception of the cecum and distal ileum within the length of the ascending colon and subsequent dilatation of the ascending colon. There are also noted various and numerous lytic lesions throughout the thoracolumbar spine and pelvis consistent with metastatic disease. General surgery was consulted.  She underwent laparoscopic right colectomy with end ileostomy on 06/29/2021.  She had a post-op cardiac arrest and inferior STEMI-- Cardiology consulted, Not a candidate for cardiac intervention    PT Comments    Pt assisted with ambulating however only able to tolerate short distance today due to dizziness and nausea.    Recommendations for follow up therapy are one component of a multi-disciplinary discharge planning process, led by the attending physician.  Recommendations may be updated based on patient status, additional functional criteria and insurance authorization.  Follow Up Recommendations  Home health PT     Assistance Recommended at Discharge Frequent or constant Supervision/Assistance  Patient can return home with the following Assistance with cooking/housework;Direct supervision/assist for medications management;Help with stairs or ramp for entrance;Assist for transportation;A little help with walking and/or transfers;A little help with bathing/dressing/bathroom   Equipment Recommendations  Rolling walker (2 wheels);BSC/3in1    Recommendations for Other Services       Precautions / Restrictions Precautions Precautions: Fall Precaution Comments: R LQ ileostomy, abd surgery     Mobility   Bed Mobility               General bed mobility comments: pt in recliner    Transfers Overall transfer level: Needs assistance Equipment used: Rolling walker (2 wheels) Transfers: Sit to/from Stand Sit to Stand: Min guard           General transfer comment: min/guard for safety, reliant on UE assist    Ambulation/Gait Ambulation/Gait assistance: Min assist Gait Distance (Feet): 30 Feet Assistive device: Rolling walker (2 wheels) Gait Pattern/deviations: Decreased stride length, Trunk flexed, Step-through pattern       General Gait Details: pt ambulated just into hallway and then reported dizziness and nausea so returned to recliner; BP 116/59 mmHg HR 72 bpm  (RN into room to give meds and notified)   Stairs             Wheelchair Mobility    Modified Rankin (Stroke Patients Only)       Balance                                            Cognition Arousal/Alertness: Awake/alert Behavior During Therapy: Flat affect                                   General Comments: very flat affect, speaks English, communicating today in English well, mostly only will answer questions (not engaging in conversation)        Exercises      General Comments        Pertinent Vitals/Pain Pain Assessment Pain Assessment: Faces Faces Pain Scale: Hurts little more Pain Location: abdomen Pain Descriptors /  Indicators: Discomfort, Sore Pain Intervention(s): Repositioned, Monitored during session    Home Living                          Prior Function            PT Goals (current goals can now be found in the care plan section) Progress towards PT goals: Progressing toward goals    Frequency    Min 3X/week      PT Plan Current plan remains appropriate    Co-evaluation              AM-PAC PT "6 Clicks" Mobility   Outcome Measure  Help needed turning from your back to your side while in a flat bed  without using bedrails?: A Little Help needed moving from lying on your back to sitting on the side of a flat bed without using bedrails?: A Little Help needed moving to and from a bed to a chair (including a wheelchair)?: A Little Help needed standing up from a chair using your arms (e.g., wheelchair or bedside chair)?: A Little Help needed to walk in hospital room?: A Little Help needed climbing 3-5 steps with a railing? : A Lot 6 Click Score: 17    End of Session Equipment Utilized During Treatment: Gait belt Activity Tolerance: Patient limited by fatigue (limited by nausea/dizziness) Patient left: in chair;with call bell/phone within reach;with chair alarm set Nurse Communication: Mobility status PT Visit Diagnosis: Muscle weakness (generalized) (M62.81);Difficulty in walking, not elsewhere classified (R26.2);Other abnormalities of gait and mobility (R26.89)     Time: 1000-1010 PT Time Calculation (min) (ACUTE ONLY): 10 min  Charges:  $Gait Training: 8-22 mins                    Jannette Spanner PT, DPT Acute Rehabilitation Services Pager: [phone] Office: [place] 07/05/2021, 3:52 PM

## 2021-07-05 NOTE — Progress Notes (Signed)
Pt used her call bell and the NT went to the room and found pt sitting on the floor on the door side of the bed. Lower Rt foot bed rail  was down and via the [place] interpreter pt states she got up and the floor was slippery and feet slipped out from under her. Pt did not have on nonslip socks. Pt is fully oriented. One nurse stood behind her and lifted her up holding under her arms while 2 staff were at he side, I got behind pt after she stood up, a walker was used for her to support herself and she was assisted back to the bed with me behind her. She was unsteady on her feet and barely made it to the edge of the bed to sit down.  Full assessment done, SR upx4 now and grey socks applied. Call bell within reach and pt showed [place] which bitton to push for help [facility], on call [facility] provider and the [place] director informed. Will cont to monitor closely for safety reasons.

## 2021-07-05 NOTE — Plan of Care (Signed)
  Problem: Activity: Goal: Risk for activity intolerance will decrease Outcome: Progressing   Problem: Nutrition: Goal: Adequate nutrition will be maintained Outcome: Progressing   Problem: Pain Managment: Goal: General experience of comfort will improve Outcome: Progressing   Problem: Safety: Goal: Ability to remain free from injury will improve Outcome: Progressing   Problem: Skin Integrity: Goal: Risk for impaired skin integrity will decrease Outcome: Progressing   

## 2021-07-05 NOTE — TOC Progression Note (Signed)
Transition of Care [facility]) - Progression Note    Patient Details  Name: Robin Estrada MRN: [id number] Date of Birth: [birth year]  Transition of Care [facility]) CM/SW [place] Phone Number: 07/05/2021, 3:18 PM  Clinical Narrative:   RN alerted me that son was here to visit.  I came to see him 15 minutes later, and he was gone.  Left message on phone to talk to him about plan post d/c. TOC will continue to follow during the course of hospitalization.     Expected Discharge Plan: [place] Barriers to Discharge: Continued Medical Work up  Expected Discharge Plan and Services Expected Discharge Plan: [place]   Discharge Planning Services: CM Consult Post Acute Care Choice: NA Living arrangements for the past 2 months: Single Family Home                                       Social Determinants of Health (SDOH) Interventions    Readmission Risk Interventions Readmission Risk Prevention Plan 07/03/2021  Transportation Screening Complete  PCP or Specialist Appt within 3-5 Days Complete  HRI or [place] Complete  Social Work Consult for [place] Planning/Counseling Complete  Palliative Care Screening Complete  Medication Review Press photographer) Complete  Some recent data might be hidden

## 2021-07-05 NOTE — Progress Notes (Signed)
[facility] Progress Note  6 Days Post-Op  Subjective: She is up in the chair this morning eating breakfast. She still has low appetite and also intermittent nausea with episode of small amount of bilious emesis this am. Feeling better currently and eating grits.  Objective: Vital signs in last 24 hours: Temp:  [96.7 F (35.9 C)-98 F (36.7 C)] 96.7 F (35.9 C) (02/24 0630) Pulse Rate:  [73-109] 73 (02/24 0630) Resp:  [16-20] 20 (02/24 0630) BP: (116-127)/(50-74) 116/63 (02/24 0630) SpO2:  [97 %-99 %] 97 % (02/24 0630) Weight:  [58.6 kg] 58.6 kg (02/24 0751) Last BM Date : 07/04/21  Intake/Output from previous day: 02/23 0701 - 02/24 0700 In: 250 [P.O.:250] Out: 800 [Stool:800] Intake/Output this shift: No intake/output data recorded.  PE: Gen:  Alert, NAD,  Card:  Regular rate and rhythm Pulm:  Normal effort Abd: Soft, appropriately tender, stoma pink, edematous and productive with scant liquid stool. Incisions intact with staples. No active leaking of ostomy at this time MSK: Calves soft NT and no edema bilaterally Skin: warm and dry, no rashes  Psych: A&Ox3   Lab Results:  Recent Labs    07/04/21 0511 07/05/21 0358  WBC 18.1* 21.8*  HGB 9.4* 9.4*  HCT 31.1* 31.6*  PLT 315 345    BMET Recent Labs    07/04/21 0511 07/05/21 0358  NA 132* 135  K 4.1 4.2  CL 108 110  CO2 19* 21*  GLUCOSE 131* 116*  BUN 11 12  CREATININE 0.52 0.49  CALCIUM 7.5* 7.6*    PT/INR No results for input(s): LABPROT, INR in the last 72 hours.  CMP     Component Value Date/Time   NA 135 07/05/2021 0358   K 4.2 07/05/2021 0358   CL 110 07/05/2021 0358   CO2 21 (L) 07/05/2021 0358   GLUCOSE 116 (H) 07/05/2021 0358   BUN 12 07/05/2021 0358   CREATININE 0.49 07/05/2021 0358   CREATININE 0.86 11/09/2017 0831   CALCIUM 7.6 (L) 07/05/2021 0358   PROT 4.8 (L) 07/04/2021 0511   ALBUMIN 1.7 (L) 07/04/2021 0511   AST 30 07/04/2021 0511   AST 23 11/09/2017 0831    ALT 13 07/04/2021 0511   ALT 34 11/09/2017 0831   ALKPHOS 180 (H) 07/04/2021 0511   BILITOT 0.3 07/04/2021 0511   BILITOT 0.4 11/09/2017 0831   GFRNONAA >60 07/05/2021 0358   GFRNONAA >60 11/09/2017 0831   GFRAA >60 04/28/2018 1439   GFRAA >60 11/09/2017 0831   Lipase     Component Value Date/Time   LIPASE 27 06/28/2021 1644       Studies/Results: VAS [place] LOWER EXTREMITY VENOUS (DVT)  Result Date: 07/04/2021  Lower Venous DVT Study Patient Name:  ALDORA PERMAN  Date of Exam:   07/04/2021 Medical Rec #: [id number]       Accession #:    [id number] Date of Birth: [birth year]       Patient Gender: F Patient Age:   76 years Exam Location:  [facility] Procedure:      VAS [place] LOWER EXTREMITY VENOUS (DVT) Referring Phys: Jana Half [facility] --------------------------------------------------------------------------------  Indications: Edema.  Limitations: Poor ultrasound/tissue interface. Comparison Study: No prior study Performing Technologist: Maudry Mayhew MHA, RDMS, RVT, RDCS  Examination Guidelines: A complete evaluation includes B-mode imaging, spectral Doppler, color Doppler, and power Doppler as needed of all accessible portions of each vessel. Bilateral testing is considered an integral part of a complete examination. Limited examinations for reoccurring indications may  be performed as noted. The reflux portion of the exam is performed with the patient in reverse Trendelenburg.  +---------+---------------+---------+-----------+----------+--------------+  RIGHT     Compressibility Phasicity Spontaneity Properties Thrombus Aging  +---------+---------------+---------+-----------+----------+--------------+  CFV       Full            Yes       Yes                                    +---------+---------------+---------+-----------+----------+--------------+  SFJ       Full                                                              +---------+---------------+---------+-----------+----------+--------------+  FV Prox   Full                                                             +---------+---------------+---------+-----------+----------+--------------+  FV Mid    Full                                                             +---------+---------------+---------+-----------+----------+--------------+  FV Distal Full                                                             +---------+---------------+---------+-----------+----------+--------------+  PFV       Full                                                             +---------+---------------+---------+-----------+----------+--------------+  POP       Full            Yes       Yes                                    +---------+---------------+---------+-----------+----------+--------------+  PTV       Full                                                             +---------+---------------+---------+-----------+----------+--------------+  PERO      Full                                                             +---------+---------------+---------+-----------+----------+--------------+   +---------+---------------+---------+-----------+----------+--------------+  LEFT      Compressibility Phasicity Spontaneity Properties Thrombus Aging  +---------+---------------+---------+-----------+----------+--------------+  CFV       Full            Yes       Yes                                    +---------+---------------+---------+-----------+----------+--------------+  SFJ       Full                                                             +---------+---------------+---------+-----------+----------+--------------+  FV Prox   Full                                                             +---------+---------------+---------+-----------+----------+--------------+  FV Mid    Full                                                              +---------+---------------+---------+-----------+----------+--------------+  FV Distal Full                                                             +---------+---------------+---------+-----------+----------+--------------+  PFV       Full                                                             +---------+---------------+---------+-----------+----------+--------------+  POP       Full            Yes       Yes                                    +---------+---------------+---------+-----------+----------+--------------+  PTV       None                      No                     Acute           +---------+---------------+---------+-----------+----------+--------------+  PERO      Full                                                             +---------+---------------+---------+-----------+----------+--------------+  Summary: RIGHT: - There is no evidence of deep vein thrombosis in the lower extremity.  - No cystic structure found in the popliteal fossa.  LEFT: - Findings consistent with acute deep vein thrombosis involving the left posterior tibial veins. - No cystic structure found in the popliteal fossa.  *See table(s) above for measurements and observations. Electronically signed by Orlie Pollen on 07/04/2021 at 6:13:47 PM.    Final     Anti-infectives: Anti-infectives (From admission, onward)    Start     Dose/Rate Route Frequency Ordered Stop   06/28/21 2200  piperacillin-tazobactam (ZOSYN) IVPB 3.375 g  Status:  Discontinued        3.375 g 12.5 mL/hr over 240 Minutes Intravenous Every 8 hours 06/28/21 2120 07/04/21 0841        Assessment/Plan  Intussusception of cecum  S/P laparoscopic assisted partial R colectomy, end ileostomy 06/29/21 Dr. Marcello Moores POD#6, AFVSS  - WBC 21.8 (18.1) - ileostomy working, monitor output to avoid dehydration/electrolyte disturbance.  - PO pain control, currently using tylenol, prn oxycodone, robaxin, dilaudid.  - tolerating PO but not eating much, RD  consulted for assistance with nutrition. Some emesis this am - monitor and prn antiemetics - PT/OT - encouraged mobilization - noted palliative consult note, full code, patients goal is to go home with [facility]  - Path consistent with more metastatic disease, patient has known lung and sigmoid colon mets - discussed with patient  FEN: carb mod.  ID: Zosyn stopped 2/22 VTE: SCD's, hep gtt for ACS stopped, eliquis 2/23 for DVT Foley: out  Dispo: ICU   Cardiac arrest, inferior STEMI  Metastatic breast cancer  PMH GIB HTN Acute on chronic anemia  LLE DVT - on [place] 2/23    LOS: 7 days   Winferd Humphrey, [facility] 07/05/2021, 8:40 AM Please see Amion for pager number during day hours 7:00am-4:30pm

## 2021-07-05 NOTE — Progress Notes (Signed)
Occupational Therapy Treatment Patient Details Name: Robin Estrada MRN: [id number] DOB: [birth year] Today's Date: 07/05/2021   History of present illness 76 year old [name] with PMH significant of lung cancer, history of breast cancer, new vertebral masses presents in the ER with several days history of worsening abdominal pain, and nausea. CT abdomen with IV contrast showed large intussusception of the cecum and distal ileum within the length of the ascending colon and subsequent dilatation of the ascending colon. There are also noted various and numerous lytic lesions throughout the thoracolumbar spine and pelvis consistent with metastatic disease. General surgery was consulted.  She underwent laparoscopic right colectomy with end ileostomy on 06/29/2021.  She had a post-op cardiac arrest and inferior STEMI-- Cardiology consulted, Not a candidate for cardiac interven   OT comments  Patient stating she had a headache and would like BP taken before getting out of bed. Reading 121/68, patient stating her normal is systolic of 468 however with encouragement mobilizes out of bed. Patient mod I with bed mobility and min A with functional ambulation using rolling walker. With cues for safety patient transfer to recliner chair and encouraged to be out of bed for at least 1 hour. Patient requesting nausea medication, RN notified.   Recommendations for follow up therapy are one component of a multi-disciplinary discharge planning process, led by the attending physician.  Recommendations may be updated based on patient status, additional functional criteria and insurance authorization.    Follow Up Recommendations  Home health OT    Assistance Recommended at Discharge Frequent or constant Supervision/Assistance  Patient can return home with the following  A little help with walking and/or transfers;A lot of help with bathing/dressing/bathroom;Assistance with cooking/housework;Help with stairs or ramp  for entrance;Assist for transportation   Equipment Recommendations  BSC/3in1       Precautions / Restrictions Precautions Precautions: Fall Precaution Comments: ostomy, abd surgery Restrictions Weight Bearing Restrictions: No       Mobility Bed Mobility Overal bed mobility: Modified Independent                     Balance Overall balance assessment: Needs assistance Sitting-balance support: Feet supported Sitting balance-Leahy Scale: Fair     Standing balance support: Reliant on assistive device for balance Standing balance-Leahy Scale: Poor                             ADL either performed or assessed with clinical judgement   ADL Overall ADL's : Needs assistance/impaired                         Toilet Transfer: Minimal assistance;Ambulation;Rolling walker (2 wheels) Toilet Transfer Details (indicate cue type and reason): Patient unsteady needing min A with walker to ambulate in room before sitting in recliner.         Functional mobility during ADLs: Minimal assistance;Cueing for safety;Rolling walker (2 wheels) General ADL Comments: Patient needing encouragement to sit up in recliner chair. Stating she has headache and wanting her blood pressure checked before getting up. Semi-supine reading 121/67      Cognition Arousal/Alertness: Awake/alert Behavior During Therapy: Flat affect Overall Cognitive Status: Difficult to assess  Pertinent Vitals/ Pain       Pain Assessment Pain Assessment: Faces Faces Pain Scale: Hurts little more Pain Location: head Pain Descriptors / Indicators: Headache Pain Intervention(s): Monitored during session         Frequency  Min 2X/week        Progress Toward Goals  OT Goals(current goals can now be found in the care plan section)  Progress towards OT goals: Progressing toward goals  Acute Rehab OT Goals Patient  Stated Goal: Feels nauseous OT Goal Formulation: With patient Time For Goal Achievement: 07/17/21 Potential to Achieve Goals: Good ADL Goals Pt Will Perform Lower Body Dressing: with supervision;with adaptive equipment;sit to/from stand;sitting/lateral leans Pt Will Transfer to Toilet: with supervision;ambulating (walker) Additional ADL Goal #1: Patient will participate in 8 minutes of standing activity in order to complete self care tasks.  Plan Discharge plan remains appropriate       AM-PAC OT "6 Clicks" Daily Activity     Outcome Measure   Help from another person eating meals?: None Help from another person taking care of personal grooming?: A Little Help from another person toileting, which includes using toliet, bedpan, or urinal?: A Little Help from another person bathing (including washing, rinsing, drying)?: A Lot Help from another person to put on and taking off regular upper body clothing?: A Little Help from another person to put on and taking off regular lower body clothing?: A Lot 6 Click Score: 17    End of Session Equipment Utilized During Treatment: Rolling walker (2 wheels)  OT Visit Diagnosis: Unsteadiness on feet (R26.81);Other abnormalities of gait and mobility (R26.89);Muscle weakness (generalized) (M62.81)   Activity Tolerance Patient limited by pain   Patient Left in chair;with call bell/phone within reach;with chair alarm set   Nurse Communication Mobility status;Other (comment) (Patient requesting nausea medications)        Time: 0626-9485 OT Time Calculation (min): 15 min  Charges: OT General Charges $OT Visit: 1 Visit OT Treatments $Self Care/Home Management : 8-22 mins  Delbert Phenix OT OT pager: [phone]   Rosemary Holms 07/05/2021, 11:06 AM

## 2021-07-05 NOTE — Progress Notes (Signed)
Progress Note   Patient: Robin Estrada JJO:[id number] DOB: 21-May-1945 DOA: 06/28/2021     7 DOS: the patient was seen and examined on 07/05/2021   Brief hospital course: 76 year old [name] with PMH significant of lung cancer, history of breast cancer, new vertebral masses presents in the ER with several days history of worsening abdominal pain, and nausea. CT abdomen with IV contrast showed large intussusception of the cecum and distal ileum within the length of the ascending colon and subsequent dilatation of the ascending colon. There are also noted various and numerous lytic lesions throughout the thoracolumbar spine and pelvis consistent with metastatic disease. General surgery was consulted.  She underwent laparoscopic right colectomy with end ileostomy on 06/29/2021. She had a post-op cardiac arrest and inferior STEMI-- Cardiology consulted, Not a candidate for cardiac intervention.   Assessment and Plan: Cecal and distal ileum intussusception with bowel obstruction: -Patient presented with severe abdominal pain associated with nausea. -S/p laparoscopic right colectomy with end ileostomy 2/18 -Leukocytosis trended up to 21k this AM, see below -Completed course of zosyn -Appreciate general surgery , ileostomy working. Per CCS, leukocytosis likely not from intra-abdominal source -Now on regular diet   Inferior STEMI / Postop cardiac arrest Ischemic cardiomyopathy; -Troponin peaked to 24,000.  Cardiology following -Patient is not a good candidate for left heart cath. -TTE showed LV ejection fraction of 50%, the left ventricle demonstrated RWMA.  Mild LVH,  diastolic parameters indeterminate. -Initially on heparin gtt, since being stopped by Cardiology. Now on coreg. 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   -Patient underwent CT-guided lung biopsy on 05/14/2021- results consistent of poorly differentiated malignancy with sarcomatoid and epithelioid features.   -Continue as needed pain medications   Hypokalemia:  -Replaced -Repeat bmet in AM   Hypomagnesemia:  -Replaced   History of GI bleed: -Continue pantoprazole   Essential hypertension: -Noted to have refused imdur -Coreg 6.25 twice daily.   Hyponatremia -Normalized   Acute blood loss anemia -H&H dropped from 10.9-8.5.  Status post 1 unit blood transfusion on 2/19. -Hemoglobin is improved, remains above 9.0, stable   Aortic atherosclerosis: Noted on recent CT   Cystic mass of pancreas: -Previous CA 19-9 level normal. -Follow-up outpatient.  Acute LLE DVT -Noted and reviewed on LE dopplers -Discussed with General Surgery. OK for anticoagulation -Was recently on heparin gtt per above. Have since ordered eliquis. Have discussed with Oncology  Leukocytosis -WBC trended up to 21k this AM -No abd pain, dysuria, cough or fevers -Question reactive leukocytosis -Will f/u repeat CBC in AM   Foley removed   Poor overall prognosis, palliative care consulted.      Subjective: Through translator, pt without abd pain or dysuria. Is complaining of mild headache this AM without photophobia  Physical Exam: Vitals:   07/04/21 2109 07/05/21 0630 07/05/21 0751 07/05/21 1307  BP: 119/61 116/63  (!) 103/56  Pulse: 79 73  85  Resp: 17 20  18   Temp: (!) 97.5 F (36.4 C) (!) 96.7 F (35.9 C)    TempSrc:  Axillary    SpO2: 99% 97%  99%  Weight:   58.6 kg    General exam: Awake, laying in bed, in nad Respiratory system: Normal respiratory effort, no wheezing Cardiovascular system: regular rate, s1,  s2 Gastrointestinal system: Soft, nondistended, positive BS Central nervous system: CN2-12 grossly intact, strength intact Extremities: Perfused, no clubbing Skin:  Normal skin turgor, no notable skin lesions seen Psychiatry: Mood normal // no visual hallucinations   Data Reviewed:  Labs reviewed, hypokalemic  Family Communication: Pt in room, family not at bedside  Disposition: Status is: Inpatient Remains inpatient appropriate because: Severity of illness   Planned Discharge Destination: Home and Home with Home Health     Author: Marylu Lund, MD 07/05/2021 2:53 PM  For on call review [email or web address].

## 2021-07-05 NOTE — Consult Note (Signed)
[place] Nurse ostomy follow up Stoma type/location: RLQ, end ileostomy  Stomal assessment/size: 1 1/2" round, pink, budded os at 6 o'clock  Peristomal assessment: intact, purple blister that has resolved between the stoma and the midline;  Midline incision with staples and is less than 1cm from ostomy Treatment options for stomal/peristomal skin: 2" skin barrier ring Output liquid green  Ostomy pouching: 2 pc. 2 1/4" with 2" skin barrier ring; downsized today from 2 3/4" because of incision  Education provided:  Patient education with [place] interpreter services/line utilized during visit.  Patient  is much less engaged today; she will perform some task like opening and closing but refused to participate in emptying. She is planning to DC to home.  Instruction on "burping" gas from the pouch Patient did not engage with skin barrier until I insisted she cut new ski barrier.  WOC performed remainder of the pouch change.  Reviewed diet and dehydration risk.  Patient did close new pouch for [place] nurse with difficulty  Enrolled patient in [place] Start Discharge program: Yes  WOC does not feel this patient is ready for DC to home alone. She will need significant teaching to be able to be at home with an ileostomy. She is not able to verbalize understanding and ask the same questions each day.  I have contacted the patient's son twice. We have not heard back and have not been able to arrange a time to meet with him.    Patient will need to be able to open and close pouch, empty at minimum to go home.   [place] Nurse will follow along with you for continued support with ostomy teaching and care [place] MSN, RN, [place], [place], [place]

## 2021-07-05 NOTE — Plan of Care (Signed)
Called by pharmacy for clarification.  Has had new DVT, started on DOAC.  Questions was about ASA and Eliquis dual use.  Patient had notes and has noted a desire to minimized heart medications due to [place] (interpretor service used- [facility])   Eliquis monotherapy is reasonable  [facility] will sign off.   Medication Recommendations:  Eliiquis and BB as above Other recommendations (labs, testing, etc):  NA Follow up as an outpatient:  07/18/21

## 2021-07-05 NOTE — Progress Notes (Addendum)
Robin Estrada   DOB:04/11/1946   JS#:[id number]   YWV#:[id number]  Oncology follow up   Subjective: Appetite still decreased and she also has some intermittent nausea with small episodes of vomiting.  Was found to have DVT of the left lower extremity yesterday.  Has been started on Eliquis.  Objective:  Vitals:   07/04/21 2109 07/05/21 0630  BP: 119/61 116/63  Pulse: 79 73  Resp: 17 20  Temp: (!) 97.5 F (36.4 C) (!) 96.7 F (35.9 C)  SpO2: 99% 97%    Body mass index is 25.23 kg/m.  Intake/Output Summary (Last 24 hours) at 07/05/2021 1207 Last data filed at 07/05/2021 0910 Gross per 24 hour  Intake 250 ml  Output 900 ml  Net -650 ml     Sclerae unicteric Abdomen soft, with some tenderness to palpation, ileostomy in place  No leg edema   Awake and alert  CBG (last 3)  No results for input(s): GLUCAP in the last 72 hours.   Labs:  Urine Studies No results for input(s): UHGB, CRYS in the last 72 hours.  Invalid input(s): UACOL, UAPR, USPG, UPH, UTP, UGL, UKET, UBIL, UNIT, UROB, ULEU, UEPI, UWBC, URBC, UBAC, CAST, [place], [place]  Basic Metabolic Panel: Recent Labs  Lab 06/29/21 [phone] 06/29/21 1016 06/30/21 0320 07/01/21 0245 07/02/21 0350 07/03/21 0546 07/04/21 0511 07/05/21 0358  NA 133*   < > 134* 133* 135 134* 132* 135  K 3.2*   < > 3.2* 3.3* 3.7 3.4* 4.1 4.2  CL 100   < > 101 107 107 110 108 110  CO2 23  --  22 22 21* 20* 19* 21*  GLUCOSE 95   < > 161* 120* 103* 125* 131* 116*  BUN 6*   < > 6* $Re'8 10 11 11 12  'zCP$ CREATININE 0.51   < > 0.52 0.55 0.52 0.56 0.52 0.49  CALCIUM 7.8*  --  7.7* 7.2* 7.6* 7.5* 7.5* 7.6*  MG 1.8  --  1.6* 2.2  --  1.7  --  2.1   < > = values in this interval not displayed.   GFR Estimated Creatinine Clearance: 47.9 mL/min (by C-G formula based on SCr of 0.49 mg/dL). Liver Function Tests: Recent Labs  Lab 06/28/21 1644 06/29/21 0623 07/04/21 0511  AST $Re'24 17 30  'DbW$ ALT $R'8 9 13  'kj$ ALKPHOS 165* 132* 180*  BILITOT 1.9* 0.7 0.3  PROT 6.1*  5.3* 4.8*  ALBUMIN 2.5* 2.1* 1.7*   Recent Labs  Lab 06/28/21 1644  LIPASE 27   No results for input(s): AMMONIA in the last 168 hours. Coagulation profile Recent Labs  Lab 06/30/21 1443  INR 1.3*    CBC: Recent Labs  Lab 06/28/21 1644 06/28/21 1758 06/29/21 0623 06/29/21 1016 07/01/21 1400 07/02/21 0350 07/03/21 0546 07/04/21 0511 07/05/21 0358  WBC 21.4*  --  18.6*   < > 20.7* 17.4* 16.6* 18.1* 21.8*  NEUTROABS 19.6*  --  16.9*  --   --   --   --   --   --   HGB 9.3*   < > 8.1*   < > 9.2* 9.6* 9.7* 9.4* 9.4*  HCT 30.8*   < > 27.0*   < > 31.0* 31.7* 32.4* 31.1* 31.6*  MCV 82.4  --  83.9   < > 85.4 84.3 85.7 85.7 87.8  PLT 383  --  333   < > 345 367 349 315 345   < > = values in this interval not displayed.  Cardiac Enzymes: No results for input(s): CKTOTAL, CKMB, CKMBINDEX, TROPONINI in the last 168 hours. BNP: Invalid input(s): POCBNP CBG: No results for input(s): GLUCAP in the last 168 hours. D-Dimer No results for input(s): DDIMER in the last 72 hours. Hgb A1c No results for input(s): HGBA1C in the last 72 hours.  Lipid Profile No results for input(s): CHOL, HDL, LDLCALC, TRIG, CHOLHDL, LDLDIRECT in the last 72 hours.  Thyroid function studies No results for input(s): TSH, T4TOTAL, T3FREE, THYROIDAB in the last 72 hours.  Invalid input(s): FREET3 Anemia work up No results for input(s): VITAMINB12, FOLATE, FERRITIN, TIBC, IRON, RETICCTPCT in the last 72 hours.  Microbiology Recent Results (from the past 240 hour(s))  Resp Panel by RT-PCR (Flu A&B, Covid) Nasopharyngeal Swab     Status: None   Collection Time: 06/28/21  8:00 PM   Specimen: Nasopharyngeal Swab; Nasopharyngeal(NP) swabs in vial transport medium  Result Value Ref Range Status   SARS Coronavirus 2 by RT PCR NEGATIVE NEGATIVE Final    Comment: (NOTE) SARS-CoV-2 target nucleic acids are NOT DETECTED.  The SARS-CoV-2 RNA is generally detectable in upper respiratory specimens during the  acute phase of infection. The lowest concentration of SARS-CoV-2 viral copies this assay can detect is 138 copies/mL. A negative result does not preclude SARS-Cov-2 infection and should not be used as the sole basis for treatment or other patient management decisions. A negative result may occur with  improper specimen collection/handling, submission of specimen other than nasopharyngeal swab, presence of viral mutation(s) within the areas targeted by this assay, and inadequate number of viral copies(<138 copies/mL). A negative result must be combined with clinical observations, patient history, and epidemiological information. The expected result is Negative.  Fact Sheet for Patients:  EntrepreneurPulse.com.au  Fact Sheet for Healthcare Providers:  IncredibleEmployment.be  This test is no t yet approved or cleared by the [place] FDA and  has been authorized for detection and/or diagnosis of SARS-CoV-2 by FDA under an Emergency Use Authorization (EUA). This EUA will remain  in effect (meaning this test can be used) for the duration of the COVID-19 declaration under Section 564(b)(1) of the Act, 21 U.S.C.section 360bbb-3(b)(1), unless the authorization is terminated  or revoked sooner.       Influenza A by PCR NEGATIVE NEGATIVE Final   Influenza B by PCR NEGATIVE NEGATIVE Final    Comment: (NOTE) The Xpert Xpress SARS-CoV-2/FLU/RSV plus assay is intended as an aid in the diagnosis of influenza from Nasopharyngeal swab specimens and should not be used as a sole basis for treatment. Nasal washings and aspirates are unacceptable for Xpert Xpress SARS-CoV-2/FLU/RSV testing.  Fact Sheet for Patients: EntrepreneurPulse.com.au  Fact Sheet for Healthcare Providers: IncredibleEmployment.be  This test is not yet approved or cleared by the [place] FDA and has been authorized for detection and/or  diagnosis of SARS-CoV-2 by FDA under an Emergency Use Authorization (EUA). This EUA will remain in effect (meaning this test can be used) for the duration of the COVID-19 declaration under Section 564(b)(1) of the Act, 21 U.S.C. section 360bbb-3(b)(1), unless the authorization is terminated or revoked.  Performed at [facility], [place] [address] [place], [place] [address]       Studies:  VAS [place] LOWER EXTREMITY VENOUS (DVT)  Result Date: 07/04/2021  Lower Venous DVT Study Patient Name:  KENDA KLOEHN  Date of Exam:   07/04/2021 Medical Rec #: [id number]       Accession #:    [id number] Date of Birth: 29-Aug-1945  Patient Gender: F Patient Age:   1 years Exam Location:  [facility] Procedure:      VAS [place] LOWER EXTREMITY VENOUS (DVT) Referring Phys: Jana [facility] --------------------------------------------------------------------------------  Indications: Edema.  Limitations: Poor ultrasound/tissue interface. Comparison Study: No prior study Performing Technologist: Maudry Mayhew MHA, RDMS, RVT, RDCS  Examination Guidelines: A complete evaluation includes B-mode imaging, spectral Doppler, color Doppler, and power Doppler as needed of all accessible portions of each vessel. Bilateral testing is considered an integral part of a complete examination. Limited examinations for reoccurring indications may be performed as noted. The reflux portion of the exam is performed with the patient in reverse Trendelenburg.  +---------+---------------+---------+-----------+----------+--------------+  RIGHT     Compressibility Phasicity Spontaneity Properties Thrombus Aging  +---------+---------------+---------+-----------+----------+--------------+  CFV       Full            Yes       Yes                                    +---------+---------------+---------+-----------+----------+--------------+  SFJ       Full                                                              +---------+---------------+---------+-----------+----------+--------------+  FV Prox   Full                                                             +---------+---------------+---------+-----------+----------+--------------+  FV Mid    Full                                                             +---------+---------------+---------+-----------+----------+--------------+  FV Distal Full                                                             +---------+---------------+---------+-----------+----------+--------------+  PFV       Full                                                             +---------+---------------+---------+-----------+----------+--------------+  POP       Full            Yes       Yes                                    +---------+---------------+---------+-----------+----------+--------------+  PTV  Full                                                             +---------+---------------+---------+-----------+----------+--------------+  PERO      Full                                                             +---------+---------------+---------+-----------+----------+--------------+   +---------+---------------+---------+-----------+----------+--------------+  LEFT      Compressibility Phasicity Spontaneity Properties Thrombus Aging  +---------+---------------+---------+-----------+----------+--------------+  CFV       Full            Yes       Yes                                    +---------+---------------+---------+-----------+----------+--------------+  SFJ       Full                                                             +---------+---------------+---------+-----------+----------+--------------+  FV Prox   Full                                                             +---------+---------------+---------+-----------+----------+--------------+  FV Mid    Full                                                              +---------+---------------+---------+-----------+----------+--------------+  FV Distal Full                                                             +---------+---------------+---------+-----------+----------+--------------+  PFV       Full                                                             +---------+---------------+---------+-----------+----------+--------------+  POP       Full            Yes       Yes                                    +---------+---------------+---------+-----------+----------+--------------+  PTV       None                      No                     Acute           +---------+---------------+---------+-----------+----------+--------------+  PERO      Full                                                             +---------+---------------+---------+-----------+----------+--------------+     Summary: RIGHT: - There is no evidence of deep vein thrombosis in the lower extremity.  - No cystic structure found in the popliteal fossa.  LEFT: - Findings consistent with acute deep vein thrombosis involving the left posterior tibial veins. - No cystic structure found in the popliteal fossa.  *See table(s) above for measurements and observations. Electronically signed by Orlie Pollen on 07/04/2021 at 6:13:47 PM.    Final     Assessment: 76 y.o. 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 Anemia secondary to iron deficiency and chronic disease Gastric ulcer Cirrhosis of the liver Emphysema Cystic mass of the pancreas Mild hyperbilirubinemia 10.  Left lower extremity DVT, on eliquis  11. Leukocytosis, likely reactive, possible related to her metastatic cancer  Plan:  -Her pathology report from [place] has been discussed with her and she  was given a copy of this.  Biopsy from the colon and lung were all consistent with metastatic disease from her previous breast cancer (malignant phyllodes tumor), triple negative. -Surgical pathology reviewed which showed malignant epithelioid-spindle cell neoplasm, 8.9 cm with satellite malignant nodules present, 6/17 lymph nodes positive. Proximal and distal resection margins negative.   -We have discussed systemic treatment options with her which would mainly be chemotherapy.  She has been reluctant to commit to treatment and we have recommended a second opinion at [facility] which she has agreed to. -However, at this time she is not a candidate for chemotherapy given reent surgery and STEMI.  -She benefited from her prior radiation to her T11 met.  The patient has expressed interest in kyphoplasty and we can ask IR to see her when she recovers better from her surgery. -Patient is more interested in targeted therapy. PD-L1 was 0%. [facility] One has been ordered on her biopsy, results still pending. -For her newly diagnosed DVT, recommend Eliquis 5 mg twice a day.  -WBC slowly trending up. No fever or obvious source of infection noted. ?reactive. Will defer management/further work up to general surgery and primary team. -I will set up her appointment with our palliative care clinic in our office -Patient would benefit from home physical therapy -I will see her back in a few weeks when her FO result returns.   Mikey Bussing, NP 07/05/2021  12:07 PM  Addendum  I have seen the patient, examined her. I agree with the assessment and and plan and have edited the notes.   She is recovering from surgery slowly, was able to walk in the hall, and is sitting in  the chair for couple hours.  But she has spent most time in bed.  I reviewed her surgical pathology, which unfortunately showed a large metastatic tumor in the terminal ileum, with multifocal disease in the colon, and 6 out of 17 positive lymph nodes.   She unfortunately is not a candidate for chemotherapy due to her recent surgery and STEMI, and that she is not particular interested in chemo.  I spoke with her son on the phone also.  They are interested in kyphoplasty, will reach out to IR to see if they think she is a candidate. She does not want take narcotics, pain control would be challenge in future.  Would appreciate palliative medicine team's assistance. I think it would be difficult for her to go back to home due to the care she needs, she would benefit from short-term rehab.  After I explained to patient and her son about rehab, they are open to this option if it's covered by her insurance, and her son would like to visit the rehab she can go. I communicated that with Dr. Wyline Copas.   I agree with Eliquis for her DVT, will continue indefinitely. Her leukocytosis may be related to her metastatic cancer.   I also discussed [place] and code status, her son would like to have time to discuss with her.  I will f/u as needed, and certainly see her back in office after her discharge.   Truitt Merle  07/05/2021

## 2021-07-05 NOTE — Progress Notes (Signed)
[place] for Apixaban  Indication: DVT  Allergies  Allergen Reactions   Statins Nausea And Vomiting and Other (See Comments)    MUSCLE PAIN   Amlodipine     fatigue   Clonidine Derivatives     headache   Losartan     abd pain   Sulfa Antibiotics Nausea Only   Sulfamethoxazole-Trimethoprim Nausea Only    " Severe Nausea "    Patient Measurements: Height: 5" (152.4 cm) Weight: 58.6 kg (129 lb 3 oz)   Vital Signs: Temp: 96.7 F (35.9 C) (02/24 0630) Temp Source: Axillary (02/24 0630) BP: 116/63 (02/24 0630) Pulse Rate: 73 (02/24 0630)  Labs: Recent Labs    07/03/21 0546 07/04/21 0511 07/05/21 0358  HGB 9.7* 9.4* 9.4*  HCT 32.4* 31.1* 31.6*  PLT 349 315 345  CREATININE 0.56 0.52 0.49     Estimated Creatinine Clearance: 47.9 mL/min (by C-G formula based on SCr of 0.49 mg/dL).   Medical History: Past Medical History:  Diagnosis Date   Breast mass, left    Large   Cancer ([place])    FIBROMYOMA  AGE 35-   BENIGN  RESOLVED   GI bleed 05/03/2021   1/23 continue with pantoprazole   Headache    Heart murmur    Hypertension    Mitral valve prolapse    Tuberculosis    AS CHILD  W/ TX    Medications:  No anticoagulants PTA   Assessment: 76 y/o female with PMH of metastatic breast cancer who is post-op lap R colectomy, end ileostomy.  Experienced STEMI and cardiac arrest in PACU on 2/18. Started on Aspirin 81mg  daily and received 48 hours of IV heparin 2/19-2/21, then transitioned to Enoxaparin 40mg  SQ q24h for VTE prophylaxis (last dose today at 8144). Bilateral lower extremity venous duplex today + for LLE DVT. Pharmacy consulted for Apixaban dosing on 07/04/21.  Plan:  Continue with Apixaban 10mg  PO BID x 7 days, then 5mg  PO BID thereafter No further dose adjustments needed, Pharmacy will sign off note writing but will continue to monitor CBC and for s/sx of bleeding Provided patient with [place] education and 30 day  voucher coupon   Peggyann Juba, PharmD, BCPS Pharmacy: [phone] 07/05/2021 11:17 AM

## 2021-07-06 DIAGNOSIS — K561 Intussusception: Secondary | ICD-10-CM | POA: Diagnosis not present

## 2021-07-06 DIAGNOSIS — Z452 Encounter for adjustment and management of vascular access device: Secondary | ICD-10-CM | POA: Diagnosis not present

## 2021-07-06 LAB — CBC
HCT: 30.8 % — ABNORMAL LOW (ref 36.0–46.0)
Hemoglobin: 9.3 g/dL — ABNORMAL LOW (ref 12.0–15.0)
MCH: 26.3 pg (ref 26.0–34.0)
MCHC: 30.2 g/dL (ref 30.0–36.0)
MCV: 87 fL (ref 80.0–100.0)
Platelets: 366 10*3/uL (ref 150–400)
RBC: 3.54 MIL/uL — ABNORMAL LOW (ref 3.87–5.11)
RDW: 28.4 % — ABNORMAL HIGH (ref 11.5–15.5)
WBC: 24.7 10*3/uL — ABNORMAL HIGH (ref 4.0–10.5)
nRBC: 0 % (ref 0.0–0.2)

## 2021-07-06 LAB — COMPREHENSIVE METABOLIC PANEL
ALT: 12 U/L (ref 0–44)
AST: 20 U/L (ref 15–41)
Albumin: 1.6 g/dL — ABNORMAL LOW (ref 3.5–5.0)
Alkaline Phosphatase: 199 U/L — ABNORMAL HIGH (ref 38–126)
Anion gap: 6 (ref 5–15)
BUN: 15 mg/dL (ref 8–23)
CO2: 19 mmol/L — ABNORMAL LOW (ref 22–32)
Calcium: 7.8 mg/dL — ABNORMAL LOW (ref 8.9–10.3)
Chloride: 106 mmol/L (ref 98–111)
Creatinine, Ser: 0.54 mg/dL (ref 0.44–1.00)
GFR, Estimated: >60 mL/min (ref >60)
Glucose, Bld: 145 mg/dL — ABNORMAL HIGH (ref 70–99)
Potassium: 3.7 mmol/L (ref 3.5–5.1)
Sodium: 131 mmol/L — ABNORMAL LOW (ref 135–145)
Total Bilirubin: 0.3 mg/dL (ref 0.3–1.2)
Total Protein: 4.3 g/dL — ABNORMAL LOW (ref 6.5–8.1)

## 2021-07-06 NOTE — Progress Notes (Signed)
°   07/05/21 2306  What Happened  Was fall witnessed? No  Was patient injured? No  Patient found on floor  Found by Staff-comment Karin Golden, [place])  Stated prior activity ambulating-unassisted  Follow Up  MD notified E. Stark Klein, NP  Time MD notified 2300  Additional tests No  Progress note created (see row info) Yes  Adult Fall Risk Assessment  Risk Factor Category (scoring not indicated) Fall has occurred during this admission (document High fall risk)  Age 76  Fall History: Fall within 6 months prior to admission 0  Elimination; Bowel and/or Urine Incontinence 0  Elimination; Bowel and/or Urine Urgency/Frequency 0  Medications: includes PCA/Opiates, Anti-convulsants, Anti-hypertensives, Diuretics, Hypnotics, Laxatives, Sedatives, and Psychotropics 3  Patient Care Equipment 2  Mobility-Assistance 2  Mobility-Gait 2  Mobility-Sensory Deficit 0  Altered awareness of immediate physical environment 0  Impulsiveness 0  Lack of understanding of one's physical/cognitive limitations 0  Total Score 11  Patient Fall Risk Level High fall risk  Adult Fall Risk Interventions  Required Bundle Interventions *See Row Information* High fall risk - low, moderate, and high requirements implemented  Additional Interventions Use of appropriate toileting equipment (bedpan, BSC, etc.);PT/OT need assessed if change in mobility from baseline  Screening for Fall Injury Risk (To be completed on HIGH fall risk patients) - Assessing Need for Floor Mats  Risk For Fall Injury- Criteria for Floor Mats None identified - No additional interventions needed  Vitals  Temp 97.7 F (36.5 C)  Temp Source Axillary  BP (!) 114/58  MAP (mmHg) 75  BP Location Right Arm  BP Method Automatic  Patient Position (if appropriate) Lying  Pulse Rate 92  Pulse Rate Source Monitor  Resp 18  Oxygen Therapy  SpO2 98 %  O2 Device Room Air  Neurological  Level of Consciousness Alert   See previous progress note regarding fall.

## 2021-07-06 NOTE — Progress Notes (Signed)
Assessment completed with the assistance of [place] tele interpreter. Pt reports pain is not bad enough to need Oxy or dilaudid, it was explained there is no more tylenol options d/t liver issues. Pt will wait for Robaxin. Repositioned in bed and reminded to call for any assistance to get up, call bell in her reach. Bed alarm on

## 2021-07-06 NOTE — Progress Notes (Signed)
Progress Note   Patient: Robin Estrada [id number] DOB: May 12, 1946 DOA: 06/28/2021     8 DOS: the patient was seen and examined on 07/06/2021   Brief hospital course: 76 year old [name] with PMH significant of lung cancer, history of breast cancer, new vertebral masses presents in the ER with several days history of worsening abdominal pain, and nausea. CT abdomen with IV contrast showed large intussusception of the cecum and distal ileum within the length of the ascending colon and subsequent dilatation of the ascending colon. There are also noted various and numerous lytic lesions throughout the thoracolumbar spine and pelvis consistent with metastatic disease. General surgery was consulted.  She underwent laparoscopic right colectomy with end ileostomy on 06/29/2021. She had a post-op cardiac arrest and inferior STEMI-- Cardiology consulted, Not a candidate for cardiac intervention.   Assessment and Plan: Cecal and distal ileum intussusception with bowel obstruction: -Patient presented with severe abdominal pain associated with nausea. -S/p laparoscopic right colectomy with end ileostomy 2/18 -Leukocytosis trended up to 24.7k this AM, see below -Completed course of zosyn -Appreciate general surgery , ileostomy working. Per CCS, leukocytosis likely not from intra-abdominal source -Now on regular diet   Inferior STEMI / Postop cardiac arrest Ischemic cardiomyopathy; -Troponin peaked to 24,000.  Cardiology following -Patient is not a good candidate for left heart cath. -TTE showed LV ejection fraction of 50%, the left ventricle demonstrated RWMA.  Mild LVH,  diastolic parameters indeterminate. -Initially on heparin gtt, since being stopped by Cardiology. Now on coreg. 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   -Patient underwent CT-guided lung biopsy on 05/14/2021- results consistent of poorly differentiated malignancy with sarcomatoid and epithelioid features.   -Continue as needed pain medications   Hypokalemia:  -Replaced -recheck bmet in AM   Hypomagnesemia:  -Replaced   History of GI bleed: -Continue pantoprazole   Essential hypertension: -Noted to have refused imdur -Coreg 6.25 twice daily.   Hyponatremia -Normalized   Acute blood loss anemia -H&H dropped from 10.9-8.5.  Status post 1 unit blood transfusion on 2/19. -Hemoglobin is improved, remains above 9.0, stable   Aortic atherosclerosis: Noted on recent CT   Cystic mass of pancreas: -Previous CA 19-9 level normal. -Follow-up outpatient.  Acute LLE DVT -Noted and reviewed on LE dopplers -Discussed with General Surgery. OK for anticoagulation -Was recently on heparin gtt per above. Have since ordered eliquis. Case was discussed with Oncology  Leukocytosis -WBC trended up to 24k this AM -Without abd pain, dysuria, cough or fevers -suspecting reactive leukocytosis as no obvious source of infection is identified -Will f/u repeat CBC in AM   Foley removed   Poor overall prognosis, palliative care consulted.      Subjective: Without complaints this AM  Physical Exam: Vitals:   07/06/21 0111 07/06/21 0500 07/06/21 0636 07/06/21 1333  BP: (!) 101/51  111/60 123/65  Pulse: 82  82 99  Resp:   18 16  Temp: 97.8 F (36.6 C)  97.8 F (36.6 C) (!) 97.5 F (36.4 C)  TempSrc: Axillary  Oral Oral  SpO2: 100%  100% 98%  Weight:  59.9 kg     General exam: Conversant, in no acute distress Respiratory system: normal chest rise, clear, no audible wheezing Cardiovascular system: regular rhythm, s1-s2 Gastrointestinal  system: Nondistended, nontender, pos BS Central nervous system: No seizures, no tremors Extremities: No cyanosis, no joint  deformities Skin: No rashes, no pallor Psychiatry: Affect normal // no auditory hallucinations   Data Reviewed:  Labs reviewed, hypokalemic  Family Communication: Pt in room, family not at bedside  Disposition: Status is: Inpatient Remains inpatient appropriate because: Severity of illness   Planned Discharge Destination: Home and Home with Home Health     Author: Marylu Lund, MD 07/06/2021 5:05 PM  For on call review [email or web address].

## 2021-07-06 NOTE — Progress Notes (Signed)
Pt had not voided all night and was afraid to use the purick. Pt is a 2 assist with a walker to stand / pivot onto [facility]. Voided 250 amber urine and c/o pain all over

## 2021-07-06 NOTE — Progress Notes (Signed)
7 Days Post-Op   Subjective/Chief Complaint: Complains of pain in legs for last 3 months. Denies abd pain   Objective: Vital signs in last 24 hours: Temp:  [97.7 F (36.5 C)-98.3 F (36.8 C)] 97.8 F (36.6 C) (02/25 0636) Pulse Rate:  [82-92] 82 (02/25 0636) Resp:  [18] 18 (02/25 0636) BP: (101-128)/(51-64) 111/60 (02/25 0636) SpO2:  [96 %-100 %] 100 % (02/25 0636) Weight:  [59.9 kg] 59.9 kg (02/25 0500) Last BM Date : 07/05/21  Intake/Output from previous day: 02/24 0701 - 02/25 0700 In: 690 [P.O.:690] Out: 450 [Urine:250; Stool:200] Intake/Output this shift: No intake/output data recorded.  General appearance: alert and cooperative Resp: clear to auscultation bilaterally Cardio: regular rate and rhythm GI: soft, minimal tenderness. Incision ok. Ostomy pink and productive  Lab Results:  Recent Labs    07/05/21 0358 07/06/21 0338  WBC 21.8* 24.7*  HGB 9.4* 9.3*  HCT 31.6* 30.8*  PLT 345 366   BMET Recent Labs    07/05/21 0358 07/06/21 0338  NA 135 131*  K 4.2 3.7  CL 110 106  CO2 21* 19*  GLUCOSE 116* 145*  BUN 12 15  CREATININE 0.49 0.54  CALCIUM 7.6* 7.8*   PT/INR No results for input(s): LABPROT, INR in the last 72 hours. ABG No results for input(s): PHART, HCO3 in the last 72 hours.  Invalid input(s): PCO2, PO2  Studies/Results: VAS [place] LOWER EXTREMITY VENOUS (DVT)  Result Date: 07/04/2021  Lower Venous DVT Study Patient Name:  Robin Estrada  Date of Exam:   07/04/2021 Medical Rec #: [id number]       Accession #:    [id number] Date of Birth: [birth year]       Patient Gender: F Patient Age:   76 years Exam Location:  [facility] Procedure:      VAS [place] LOWER EXTREMITY VENOUS (DVT) Referring Phys: Jana Half [facility] --------------------------------------------------------------------------------  Indications: Edema.  Limitations: Poor ultrasound/tissue interface. Comparison Study: No prior study Performing Technologist: Maudry Mayhew MHA, RDMS,  RVT, RDCS  Examination Guidelines: A complete evaluation includes B-mode imaging, spectral Doppler, color Doppler, and power Doppler as needed of all accessible portions of each vessel. Bilateral testing is considered an integral part of a complete examination. Limited examinations for reoccurring indications may be performed as noted. The reflux portion of the exam is performed with the patient in reverse Trendelenburg.  +---------+---------------+---------+-----------+----------+--------------+  RIGHT     Compressibility Phasicity Spontaneity Properties Thrombus Aging  +---------+---------------+---------+-----------+----------+--------------+  CFV       Full            Yes       Yes                                    +---------+---------------+---------+-----------+----------+--------------+  SFJ       Full                                                             +---------+---------------+---------+-----------+----------+--------------+  FV Prox   Full                                                             +---------+---------------+---------+-----------+----------+--------------+  FV Mid    Full                                                             +---------+---------------+---------+-----------+----------+--------------+  FV Distal Full                                                             +---------+---------------+---------+-----------+----------+--------------+  PFV       Full                                                             +---------+---------------+---------+-----------+----------+--------------+  POP       Full            Yes       Yes                                    +---------+---------------+---------+-----------+----------+--------------+  PTV       Full                                                             +---------+---------------+---------+-----------+----------+--------------+  PERO      Full                                                              +---------+---------------+---------+-----------+----------+--------------+   +---------+---------------+---------+-----------+----------+--------------+  LEFT      Compressibility Phasicity Spontaneity Properties Thrombus Aging  +---------+---------------+---------+-----------+----------+--------------+  CFV       Full            Yes       Yes                                    +---------+---------------+---------+-----------+----------+--------------+  SFJ       Full                                                             +---------+---------------+---------+-----------+----------+--------------+  FV Prox   Full                                                             +---------+---------------+---------+-----------+----------+--------------+  FV Mid    Full                                                             +---------+---------------+---------+-----------+----------+--------------+  FV Distal Full                                                             +---------+---------------+---------+-----------+----------+--------------+  PFV       Full                                                             +---------+---------------+---------+-----------+----------+--------------+  POP       Full            Yes       Yes                                    +---------+---------------+---------+-----------+----------+--------------+  PTV       None                      No                     Acute           +---------+---------------+---------+-----------+----------+--------------+  PERO      Full                                                             +---------+---------------+---------+-----------+----------+--------------+     Summary: RIGHT: - There is no evidence of deep vein thrombosis in the lower extremity.  - No cystic structure found in the popliteal fossa.  LEFT: - Findings consistent with acute deep vein thrombosis involving the left posterior tibial veins. - No cystic structure found  in the popliteal fossa.  *See table(s) above for measurements and observations. Electronically signed by Orlie Pollen on 07/04/2021 at 6:13:47 PM.    Final     Anti-infectives: Anti-infectives (From admission, onward)    Start     Dose/Rate Route Frequency Ordered Stop   06/28/21 2200  piperacillin-tazobactam (ZOSYN) IVPB 3.375 g  Status:  Discontinued        3.375 g 12.5 mL/hr over 240 Minutes Intravenous Every 8 hours 06/28/21 2120 07/04/21 0841       Assessment/Plan: s/p Procedure(s): LAPAROSCOPIC PARTIAL RIGHT COLECTOMY WITH END ILEOSTOMY (N/A) Advance diet  Intussusception of cecum  S/P laparoscopic assisted partial R colectomy, end ileostomy 06/29/21 Dr. Marcello Moores POD#7, AFVSS  - WBC 21.8 (18.1) - ileostomy working, monitor output to avoid dehydration/electrolyte disturbance.  - PO pain control, currently using tylenol, prn oxycodone, robaxin, dilaudid.  - tolerating PO  but not eating much, RD consulted for assistance with nutrition. Some emesis this am - monitor and prn antiemetics - PT/OT - encouraged mobilization - noted palliative consult note, full code, patients goal is to go home with [facility]  - Path consistent with more metastatic disease, patient has known lung and sigmoid colon mets - discussed with patient   FEN: carb mod.  ID: Zosyn stopped 2/22 VTE: SCD's, hep gtt for ACS stopped, eliquis 2/23 for DVT Foley: out  Dispo: ICU    Cardiac arrest, inferior STEMI  Metastatic breast cancer  PMH GIB HTN Acute on chronic anemia  LLE DVT - on [place] 2/23  LOS: 8 days    Autumn Messing III 07/06/2021

## 2021-07-06 NOTE — Progress Notes (Signed)
Notified by patient's RN that pt was attempting to get out bed unassisted and had unwitnessed fall. Per RN, no obvious injury or any neurological change.  Unwitnessed fall with no obvious injury or focal neurological deficit/changes - Hold imaging for now - Frequent Neuro checks - Obtain STAT head CT for any new acute headache or new neurological deficits - PT consult, OT consult if appropriate - Falls precautions   Rufina Falco, DNP, CCRN, FNP-C, AGACNP-BC Acute Care Nurse Practitioner  [facility] Pager: [phone] [place] [facility]

## 2021-07-07 ENCOUNTER — Inpatient Hospital Stay (HOSPITAL_COMMUNITY): Payer: Medicare HMO

## 2021-07-07 DIAGNOSIS — K561 Intussusception: Secondary | ICD-10-CM | POA: Diagnosis not present

## 2021-07-07 LAB — COMPREHENSIVE METABOLIC PANEL
ALT: 12 U/L (ref 0–44)
AST: 22 U/L (ref 15–41)
Albumin: 1.9 g/dL — ABNORMAL LOW (ref 3.5–5.0)
Alkaline Phosphatase: 247 U/L — ABNORMAL HIGH (ref 38–126)
Anion gap: 7 (ref 5–15)
BUN: 15 mg/dL (ref 8–23)
CO2: 21 mmol/L — ABNORMAL LOW (ref 22–32)
Calcium: 8.3 mg/dL — ABNORMAL LOW (ref 8.9–10.3)
Chloride: 104 mmol/L (ref 98–111)
Creatinine, Ser: 0.54 mg/dL (ref 0.44–1.00)
GFR, Estimated: >60 mL/min (ref >60)
Glucose, Bld: 129 mg/dL — ABNORMAL HIGH (ref 70–99)
Potassium: 4 mmol/L (ref 3.5–5.1)
Sodium: 132 mmol/L — ABNORMAL LOW (ref 135–145)
Total Bilirubin: 0.4 mg/dL (ref 0.3–1.2)
Total Protein: 5.2 g/dL — ABNORMAL LOW (ref 6.5–8.1)

## 2021-07-07 LAB — CBC
HCT: 34 % — ABNORMAL LOW (ref 36.0–46.0)
Hemoglobin: 10.2 g/dL — ABNORMAL LOW (ref 12.0–15.0)
MCH: 26.8 pg (ref 26.0–34.0)
MCHC: 30 g/dL (ref 30.0–36.0)
MCV: 89.2 fL (ref 80.0–100.0)
Platelets: 448 10*3/uL — ABNORMAL HIGH (ref 150–400)
RBC: 3.81 MIL/uL — ABNORMAL LOW (ref 3.87–5.11)
RDW: 28.5 % — ABNORMAL HIGH (ref 11.5–15.5)
WBC: 25.1 10*3/uL — ABNORMAL HIGH (ref 4.0–10.5)
nRBC: 0 % (ref 0.0–0.2)

## 2021-07-07 NOTE — Progress Notes (Signed)
8 Days Post-Op   Subjective/Chief Complaint: Complains of pain in legs. Denies abd pain   Objective: Vital signs in last 24 hours: Temp:  [97.5 F (36.4 C)-97.7 F (36.5 C)] 97.7 F (36.5 C) (02/26 0533) Pulse Rate:  [84-99] 84 (02/26 0533) Resp:  [16-20] 16 (02/26 0533) BP: (114-123)/(59-65) 119/59 (02/26 0533) SpO2:  [96 %-98 %] 96 % (02/26 0533) Weight:  [61.1 kg] 61.1 kg (02/26 0500) Last BM Date : 07/06/21  Intake/Output from previous day: 02/25 0701 - 02/26 0700 In: 360 [P.O.:360] Out: 500 [Stool:500] Intake/Output this shift: No intake/output data recorded.  General appearance: alert and cooperative Resp: clear to auscultation bilaterally Cardio: regular rate and rhythm GI: soft, nontender. Ostomy pink and productive  Lab Results:  Recent Labs    07/06/21 0338 07/07/21 0903  WBC 24.7* 25.1*  HGB 9.3* 10.2*  HCT 30.8* 34.0*  PLT 366 448*   BMET Recent Labs    07/06/21 0338 07/07/21 0903  NA 131* 132*  K 3.7 4.0  CL 106 104  CO2 19* 21*  GLUCOSE 145* 129*  BUN 15 15  CREATININE 0.54 0.54  CALCIUM 7.8* 8.3*   PT/INR No results for input(s): LABPROT, INR in the last 72 hours. ABG No results for input(s): PHART, HCO3 in the last 72 hours.  Invalid input(s): PCO2, PO2  Studies/Results: No results found.  Anti-infectives: Anti-infectives (From admission, onward)    Start     Dose/Rate Route Frequency Ordered Stop   06/28/21 2200  piperacillin-tazobactam (ZOSYN) IVPB 3.375 g  Status:  Discontinued        3.375 g 12.5 mL/hr over 240 Minutes Intravenous Every 8 hours 06/28/21 2120 07/04/21 0841       Assessment/Plan: s/p Procedure(s): LAPAROSCOPIC PARTIAL RIGHT COLECTOMY WITH END ILEOSTOMY (N/A) Advance diet Intussusception of cecum  S/P laparoscopic assisted partial R colectomy, end ileostomy 06/29/21 Dr. Marcello Moores POD#8, AFVSS  - WBC 21.8 (18.1) - ileostomy working, monitor output to avoid dehydration/electrolyte disturbance.  - PO pain  control, currently using tylenol, prn oxycodone, robaxin, dilaudid.  - tolerating PO but not eating much, RD consulted for assistance with nutrition. Some emesis this am - monitor and prn antiemetics - PT/OT - encouraged mobilization - noted palliative consult note, full code, patients goal is to go home with [facility]  - Path consistent with more metastatic disease, patient has known lung and sigmoid colon mets - discussed with patient   FEN: carb mod.  ID: Zosyn stopped 2/22 VTE: SCD's, hep gtt for ACS stopped, eliquis 2/23 for DVT Foley: out  Dispo: ICU    Cardiac arrest, inferior STEMI  Metastatic breast cancer  PMH GIB HTN Acute on chronic anemia  LLE DVT - on [place] 2/23  LOS: 9 days    Robin Estrada 07/07/2021

## 2021-07-07 NOTE — Progress Notes (Signed)
Progress Note   Patient: Robin Estrada [id number] DOB: 1945/07/31 DOA: 06/28/2021     9 DOS: the patient was seen and examined on 07/07/2021   Brief hospital course: 76 year old [name] with PMH significant of lung cancer, history of breast cancer, new vertebral masses presents in the ER with several days history of worsening abdominal pain, and nausea. CT abdomen with IV contrast showed large intussusception of the cecum and distal ileum within the length of the ascending colon and subsequent dilatation of the ascending colon. There are also noted various and numerous lytic lesions throughout the thoracolumbar spine and pelvis consistent with metastatic disease. General surgery was consulted.  She underwent laparoscopic right colectomy with end ileostomy on 06/29/2021. She had a post-op cardiac arrest and inferior STEMI-- Cardiology consulted, Not a candidate for cardiac intervention.   Assessment and Plan: Cecal and distal ileum intussusception with bowel obstruction: -Patient presented with severe abdominal pain associated with nausea. -S/p laparoscopic right colectomy with end ileostomy 2/18 -Leukocytosis trended up to 25.1k this AM, see below -Completed course of zosyn -Appreciate general surgery , ileostomy working. Per CCS, leukocytosis likely not from intra-abdominal source -Now on regular diet   Inferior STEMI / Postop cardiac arrest Ischemic cardiomyopathy; -Troponin peaked to 24,000.  Cardiology following -Patient is not a good candidate for left heart cath. -TTE showed LV ejection fraction of 50%, the left ventricle demonstrated RWMA.  Mild LVH,  diastolic parameters indeterminate. -Initially on heparin gtt, since being stopped by Cardiology. Now on coreg. 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   -Patient underwent CT-guided lung biopsy on 05/14/2021- results consistent of poorly differentiated malignancy with sarcomatoid and epithelioid features.   -Continue as needed pain medications   Hypokalemia:  -Replaced -recheck bmet in AM   Hypomagnesemia:  -Replaced   History of GI bleed: -Continue pantoprazole   Essential hypertension: -Noted to have refused imdur -Coreg 6.25 twice daily.   Hyponatremia -Normalized   Acute blood loss anemia -H&H dropped from 10.9-8.5.  Status post 1 unit blood transfusion on 2/19. -Hemoglobin is improved, remains above 9.0, stable   Aortic atherosclerosis: Noted on recent CT   Cystic mass of pancreas: -Previous CA 19-9 level normal. -Follow-up outpatient.  Acute LLE DVT -Noted and reviewed on LE dopplers -Discussed with General Surgery. OK for anticoagulation -Was recently on heparin gtt per above. Have since ordered eliquis. Case was discussed with Oncology  Leukocytosis -WBC trended up to 25.1k this AM -Without abd pain, dysuria, cough or fevers -suspecting reactive leukocytosis as no obvious source of infection is identified -Will f/u repeat CBC in AM   Foley removed   Poor overall prognosis, palliative care consulted.      Subjective: Without complaints this AM  Physical Exam: Vitals:   07/07/21 0533 07/07/21 1301 07/07/21 1502 07/07/21 1702  BP: (!) 119/59 108/62 (!) 112/55 (!) 115/51  Pulse: 84 (!) 115 (!) 101 90  Resp: 16 16 20 16   Temp: 97.7 F (36.5 C) 98.5 F (36.9 C) 97.7 F (36.5 C) (!) 97.4 F (36.3 C)  TempSrc: Oral Oral Oral Oral  SpO2: 96% 97% 95% 96%  Weight:       General exam: Awake, laying in bed, in nad Respiratory system: Normal respiratory effort, no wheezing Cardiovascular  system: regular rate, s1, s2 Gastrointestinal system: Soft, nondistended, positive BS Central nervous system: CN2-12 grossly intact, strength  intact Extremities: Perfused, no clubbing Skin: Normal skin turgor, no notable skin lesions seen Psychiatry: Mood normal // no visual hallucinations   Data Reviewed:  Labs reviewed, Na 132  Family Communication: Pt in room, family not at bedside  Disposition: Status is: Inpatient Remains inpatient appropriate because: Severity of illness   Planned Discharge Destination: Home and Home with Home Health     Author: Marylu Lund, MD 07/07/2021 5:57 PM  For on call review [email or web address].

## 2021-07-08 ENCOUNTER — Encounter (HOSPITAL_COMMUNITY): Payer: Self-pay

## 2021-07-08 DIAGNOSIS — K561 Intussusception: Secondary | ICD-10-CM | POA: Diagnosis not present

## 2021-07-08 DIAGNOSIS — Z452 Encounter for adjustment and management of vascular access device: Secondary | ICD-10-CM | POA: Diagnosis not present

## 2021-07-08 LAB — COMPREHENSIVE METABOLIC PANEL
ALT: 10 U/L (ref 0–44)
AST: 19 U/L (ref 15–41)
Albumin: 1.6 g/dL — ABNORMAL LOW (ref 3.5–5.0)
Alkaline Phosphatase: 188 U/L — ABNORMAL HIGH (ref 38–126)
Anion gap: 6 (ref 5–15)
BUN: 13 mg/dL (ref 8–23)
CO2: 21 mmol/L — ABNORMAL LOW (ref 22–32)
Calcium: 8 mg/dL — ABNORMAL LOW (ref 8.9–10.3)
Chloride: 107 mmol/L (ref 98–111)
Creatinine, Ser: 0.44 mg/dL (ref 0.44–1.00)
GFR, Estimated: >60 mL/min (ref >60)
Glucose, Bld: 125 mg/dL — ABNORMAL HIGH (ref 70–99)
Potassium: 4 mmol/L (ref 3.5–5.1)
Sodium: 134 mmol/L — ABNORMAL LOW (ref 135–145)
Total Bilirubin: 0.2 mg/dL — ABNORMAL LOW (ref 0.3–1.2)
Total Protein: 4.3 g/dL — ABNORMAL LOW (ref 6.5–8.1)

## 2021-07-08 LAB — CBC
HCT: 28.7 % — ABNORMAL LOW (ref 36.0–46.0)
Hemoglobin: 8.6 g/dL — ABNORMAL LOW (ref 12.0–15.0)
MCH: 26.5 pg (ref 26.0–34.0)
MCHC: 30 g/dL (ref 30.0–36.0)
MCV: 88.6 fL (ref 80.0–100.0)
Platelets: 356 10*3/uL (ref 150–400)
RBC: 3.24 MIL/uL — ABNORMAL LOW (ref 3.87–5.11)
RDW: 28.1 % — ABNORMAL HIGH (ref 11.5–15.5)
WBC: 18.6 10*3/uL — ABNORMAL HIGH (ref 4.0–10.5)
nRBC: 0 % (ref 0.0–0.2)

## 2021-07-08 MED ORDER — MUSCLE RUB 10-15 % EX CREA
TOPICAL_CREAM | CUTANEOUS | Status: DC | PRN
  Filled 2021-07-08: qty 85
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
## 2021-07-08 NOTE — Progress Notes (Signed)
Patient ID: Robin Estrada, female   DOB: 24-Mar-1946, 76 y.o.   MRN: [id number] [facility] Surgery Progress Note  9 Days Post-Op  Subjective: CC-  No abdominal complaints. Denies abdominal pain, n/v. Ileostomy functioning. Tolerating diet but still not eating a lot. Likes the mashed potatoes and grits. Continues to endorse right leg pain.  Objective: Vital signs in last 24 hours: Temp:  [97.4 F (36.3 C)-98.5 F (36.9 C)] 97.9 F (36.6 C) (02/27 0555) Pulse Rate:  [86-115] 102 (02/27 0555) Resp:  [16-20] 16 (02/27 0555) BP: (105-128)/(48-66) 128/66 (02/27 0555) SpO2:  [95 %-97 %] 96 % (02/27 0555) Weight:  [62 kg-62.3 kg] 62 kg (02/27 0500) Last BM Date : 07/07/21  Intake/Output from previous day: 02/26 0701 - 02/27 0700 In: -  Out: 325 [Urine:250; Stool:75] Intake/Output this shift: No intake/output data recorded.  PE: Gen:  Alert, NAD, pleasant Pulm: rate and effort normal Abd: Soft, NT/ND, ostomy pink and budded with air and soft brown stool in pouch  Lab Results:  Recent Labs    07/07/21 0903 07/08/21 0507  WBC 25.1* 18.6*  HGB 10.2* 8.6*  HCT 34.0* 28.7*  PLT 448* 356   BMET Recent Labs    07/07/21 0903 07/08/21 0507  NA 132* 134*  K 4.0 4.0  CL 104 107  CO2 21* 21*  GLUCOSE 129* 125*  BUN 15 13  CREATININE 0.54 0.44  CALCIUM 8.3* 8.0*   PT/INR No results for input(s): LABPROT, INR in the last 72 hours. CMP     Component Value Date/Time   NA 134 (L) 07/08/2021 0507   K 4.0 07/08/2021 0507   CL 107 07/08/2021 0507   CO2 21 (L) 07/08/2021 0507   GLUCOSE 125 (H) 07/08/2021 0507   BUN 13 07/08/2021 0507   CREATININE 0.44 07/08/2021 0507   CREATININE 0.86 11/09/2017 0831   CALCIUM 8.0 (L) 07/08/2021 0507   PROT 4.3 (L) 07/08/2021 0507   ALBUMIN 1.6 (L) 07/08/2021 0507   AST 19 07/08/2021 0507   AST 23 11/09/2017 0831   ALT 10 07/08/2021 0507   ALT 34 11/09/2017 0831   ALKPHOS 188 (H) 07/08/2021 0507   BILITOT 0.2 (L) 07/08/2021 0507    BILITOT 0.4 11/09/2017 0831   GFRNONAA >60 07/08/2021 0507   GFRNONAA >60 11/09/2017 0831   GFRAA >60 04/28/2018 1439   GFRAA >60 11/09/2017 0831   Lipase     Component Value Date/Time   LIPASE 27 06/28/2021 1644       Studies/Results: DG Knee 1-2 Views Right  Result Date: 07/07/2021 CLINICAL DATA:  Recent fall.  RIGHT knee pain. EXAM: RIGHT KNEE - 1-2 VIEW COMPARISON:  None. FINDINGS: No evidence of fracture, dislocation, or joint effusion. No evidence of arthropathy or other focal bone abnormality. Soft tissues are unremarkable. IMPRESSION: Negative. Electronically Signed   By: Nolon Nations M.D.   On: 07/07/2021 10:34   DG HIP UNILAT WITH PELVIS 2-3 VIEWS RIGHT  Result Date: 07/07/2021 CLINICAL DATA:  Recent fall.  RIGHT hip and knee pain. EXAM: DG HIP (WITH OR WITHOUT PELVIS) 2-3V RIGHT COMPARISON:  None. FINDINGS: There is no acute fracture or subluxation. Degenerative changes are seen in both hips and in the lumbosacral region. There is diffuse body wall edema. Surgical clips overlie the abdomen and pelvis consistent with recent abdominal surgery. IMPRESSION: No acute fracture or subluxation. Electronically Signed   By: Nolon Nations M.D.   On: 07/07/2021 10:32    Anti-infectives: Anti-infectives (From admission, onward)  Start     Dose/Rate Route Frequency Ordered Stop   06/28/21 2200  piperacillin-tazobactam (ZOSYN) IVPB 3.375 g  Status:  Discontinued        3.375 g 12.5 mL/hr over 240 Minutes Intravenous Every 8 hours 06/28/21 2120 07/04/21 0841        Assessment/Plan Intussusception of cecum  S/P laparoscopic assisted partial R colectomy, end ileostomy 06/29/21 Dr. Marcello Moores POD#9 - WBC trending down 18.6 from 25. Leukocytosis does not appear to be from an abdominal source - ileostomy working, monitor output to avoid dehydration/electrolyte disturbance.  - not having any abdominal pain, using pain medication more for her back/ RLE pain - tolerating PO but  not eating much, encouraged her to have family bring food in that she likes. Will discuss possibility of starting appetite stimulant with primary team - continue PT/OT - recommending home health - palliative and oncology have seen. patients goal is to go home with [facility]  - Path consistent with more metastatic disease, patient has known lung and sigmoid colon mets    FEN: Regular diet ID: Zosyn stopped 2/22 VTE: SCD's, hep gtt for ACS stopped, eliquis 2/23 for DVT Foley: out     Cardiac arrest, inferior STEMI  Metastatic breast cancer  PMH GIB HTN Acute on chronic anemia  LLE DVT - on [place] 2/23   LOS: 10 days    Wellington [place], [facility] Surgery 07/08/2021, 8:27 AM Please see Amion for pager number during day hours 7:00am-4:30pm

## 2021-07-08 NOTE — Progress Notes (Signed)
Pt was assisted to [facility] to void, also passed a smear of stool and had flecks of blood in the toilet & on tissue.

## 2021-07-08 NOTE — Progress Notes (Addendum)
PT Cancellation Note  Patient Details Name: Robin Estrada MRN: [id number] DOB: 07/06/1945   Cancelled Treatment:    Reason Eval/Treat Not Completed: Patient declined, no reason specified. Max encouragement for pt to participate but she refused.     [facility]  Office: [phone] Pager: [phone]

## 2021-07-08 NOTE — Progress Notes (Signed)
Progress Note   Patient: Robin Estrada GYI:[id number] DOB: 04-Mar-1946 DOA: 06/28/2021     10 DOS: the patient was seen and examined on 07/08/2021   Brief hospital course: 76 year old [name] with PMH significant of lung cancer, history of breast cancer, new vertebral masses presents in the ER with several days history of worsening abdominal pain, and nausea. CT abdomen with IV contrast showed large intussusception of the cecum and distal ileum within the length of the ascending colon and subsequent dilatation of the ascending colon. There are also noted various and numerous lytic lesions throughout the thoracolumbar spine and pelvis consistent with metastatic disease. General surgery was consulted.  She underwent laparoscopic right colectomy with end ileostomy on 06/29/2021. She had a post-op cardiac arrest and inferior STEMI-- Cardiology consulted, Not a candidate for cardiac intervention.   Assessment and Plan: Cecal and distal ileum intussusception with bowel obstruction: -Patient presented with severe abdominal pain associated with nausea. -S/p laparoscopic right colectomy with end ileostomy 2/18 -Leukocytosis now down to 18.6k -Completed course of zosyn -Appreciate general surgery , ileostomy working. Per CCS, leukocytosis likely not from intra-abdominal source -Now on regular diet   Inferior STEMI / Postop cardiac arrest Ischemic cardiomyopathy; -Troponin peaked to 24,000.  Cardiology following -Patient is not a good candidate for left heart cath. -TTE showed LV ejection fraction of 50%, the left ventricle demonstrated RWMA.  Mild LVH,  diastolic parameters indeterminate. -Initially on heparin gtt, since being stopped by Cardiology. Now on coreg. 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   -Patient underwent CT-guided lung biopsy on 05/14/2021- results consistent of poorly differentiated malignancy with sarcomatoid and epithelioid features.   -Continue as needed pain medications   Hypokalemia:  -Replaced -recheck bmet in AM   Hypomagnesemia:  -Replaced   History of GI bleed: -Continue pantoprazole   Essential hypertension: -Noted to have refused imdur -Coreg 6.25 twice daily.   Hyponatremia -Normalized   Acute blood loss anemia -H&H dropped from 10.9-8.5.  Status post 1 unit blood transfusion on 2/19. -Hemoglobin is improved, remains above 9.0, stable   Aortic atherosclerosis: Noted on recent CT   Cystic mass of pancreas: -Previous CA 19-9 level normal. -Follow-up outpatient.  Acute LLE DVT -Noted and reviewed on LE dopplers -Discussed with General Surgery. OK for anticoagulation -Was recently on heparin gtt per above. Have since ordered eliquis. Case was discussed with Oncology  Leukocytosis -WBC trended up to 25.1k -Without abd pain, dysuria, cough or fevers -suspecting reactive leukocytosis as no obvious source of infection is identified -WBC now trending down, to 18.6k   Foley removed   Poor overall prognosis, palliative care consulted.      Subjective: No complaints this AM  Physical Exam: Vitals:   07/07/21 2047 07/08/21 0500 07/08/21 0555 07/08/21 1400  BP: (!) 105/48  128/66 124/61  Pulse: 88  (!) 102 (!) 109  Resp: 18  16 16   Temp: 98 F (36.7 C)  97.9 F (36.6 C) 98.8 F (37.1 C)  TempSrc: Oral  Oral Oral  SpO2: 95%  96% 95%  Weight:  62 kg    Height:       General exam: Conversant, in no acute distress Respiratory system: normal chest rise, clear, no audible wheezing  Cardiovascular system: regular rhythm, s1-s2 Gastrointestinal system: Nondistended, nontender, pos BS Central nervous system: No seizures, no tremors Extremities: No  cyanosis, no joint deformities Skin: No rashes, no pallor Psychiatry: Affect normal // no auditory hallucinations   Data Reviewed:  Labs reviewed, Na 132  Family Communication: Pt in room, family not at bedside  Disposition: Status is: Inpatient Remains inpatient appropriate because: Severity of illness   Planned Discharge Destination: Home and Home with Home Health     Author: Marylu Lund, MD 07/08/2021 5:11 PM  For on call review [email or web address].

## 2021-07-08 NOTE — TOC Progression Note (Signed)
Transition of Care [facility]) - Progression Note    Patient Details  Name: Robin Estrada MRN: [id number] Date of Birth: 09-Jun-1945  [facility] CM/SW Contact  Ross Ludwig, [place] Phone Number: 07/08/2021, 5:50 PM  Clinical Narrative:    CSW was able to set up [facility] PT and RN home health with [facility] ([place]).  They will contact patient 24 hours after she discharges to set up the first visit.  Patient is declining SNF at this time, however CSW did send patient's information to SNFs in case she changes her mind.  CSW to continue to follow patient's progress throughout discharge planning.  Expected Discharge Plan: [place] Barriers to Discharge: Continued Medical Work up  Expected Discharge Plan and Services Expected Discharge Plan: [place]   Discharge Planning Services: CM Consult Post Acute Care Choice: NA Living arrangements for the past 2 months: Single Family Home                                       Social Determinants of Health (SDOH) Interventions    Readmission Risk Interventions Readmission Risk Prevention Plan 07/03/2021  Transportation Screening Complete  PCP or Specialist Appt within 3-5 Days Complete  HRI or [place] Complete  Social Work Consult for [place] Planning/Counseling Complete  Palliative Care Screening Complete  Medication Review Press photographer) Complete  Some recent data might be hidden

## 2021-07-08 NOTE — Consult Note (Signed)
[place] Nurse ostomy follow up Stoma type/location: RLQ end ileostomy Stomal assessment/size: 1 and 1/2 inches round, red, edematous, slightly long, lumen in center. Peristomal assessment: intact. Pouching system covers staple line of closely approximated incision. Treatment options for stomal/peristomal skin: skin barrier ring Output: soft brown seedy stool Ostomy pouching: 2pc. 2 and 3/4 inch pouching system with skin barrier ring Education provided:   Explained stoma characteristic of edema; explained that this is the reason we must measure as the stoma is expected to get smaller and we still want a good fit. Pouch change performed with patient assisting. She cuts out new skin barrier after I measure stoma and trace pattern on new skin barrier. Patient cleans  peristomal skin and stoma, stretches skin barrier ring and places on back of skin barrier. Education on emptying when 1/3 to 1/2 full and how to empty Demonstrated use of wick to clean spout; taught rationale of using toilet paper over other materials (wipes, wash cloths, etc.) Discussed bathing, dehydration  Discussed food blockage and how to avoid: she understands to chew food well.  Supplies in room: 5 pouches, 5 skin barriers, 5 skin barrier rings, pattern, stoma measuring guide.  Enrolled patient in [place] Discharge program: Yes, previously.  Discussed with Bedside RN that patient is to perform pouch emptying at all times now.  Empty when 1/3 to 1/2 full.  [place] nursing team will continue to follow, and will remain available to this patient, the nursing and medical teams.   Thanks, Maudie Flakes, MSN, RN, [place], Arther Abbott  Pager# [phone]

## 2021-07-08 NOTE — Progress Notes (Signed)
Pt has emptied colostomy independently and was able to navigate the process without instruction.

## 2021-07-09 ENCOUNTER — Encounter: Payer: Self-pay | Admitting: Hematology

## 2021-07-09 ENCOUNTER — Inpatient Hospital Stay (HOSPITAL_COMMUNITY): Payer: Medicare HMO

## 2021-07-09 DIAGNOSIS — Z452 Encounter for adjustment and management of vascular access device: Secondary | ICD-10-CM | POA: Diagnosis not present

## 2021-07-09 DIAGNOSIS — K561 Intussusception: Secondary | ICD-10-CM | POA: Diagnosis not present

## 2021-07-09 LAB — CBC
HCT: 27.3 % — ABNORMAL LOW (ref 36.0–46.0)
Hemoglobin: 8.5 g/dL — ABNORMAL LOW (ref 12.0–15.0)
MCH: 26.9 pg (ref 26.0–34.0)
MCHC: 31.1 g/dL (ref 30.0–36.0)
MCV: 86.4 fL (ref 80.0–100.0)
Platelets: 380 10*3/uL (ref 150–400)
RBC: 3.16 MIL/uL — ABNORMAL LOW (ref 3.87–5.11)
RDW: 28 % — ABNORMAL HIGH (ref 11.5–15.5)
WBC: 23.6 10*3/uL — ABNORMAL HIGH (ref 4.0–10.5)
nRBC: 0 % (ref 0.0–0.2)

## 2021-07-09 LAB — COMPREHENSIVE METABOLIC PANEL
ALT: 11 U/L (ref 0–44)
AST: 22 U/L (ref 15–41)
Albumin: 1.6 g/dL — ABNORMAL LOW (ref 3.5–5.0)
Alkaline Phosphatase: 174 U/L — ABNORMAL HIGH (ref 38–126)
Anion gap: 6 (ref 5–15)
BUN: 12 mg/dL (ref 8–23)
CO2: 23 mmol/L (ref 22–32)
Calcium: 8.1 mg/dL — ABNORMAL LOW (ref 8.9–10.3)
Chloride: 105 mmol/L (ref 98–111)
Creatinine, Ser: 0.42 mg/dL — ABNORMAL LOW (ref 0.44–1.00)
GFR, Estimated: >60 mL/min (ref >60)
Glucose, Bld: 134 mg/dL — ABNORMAL HIGH (ref 70–99)
Potassium: 4 mmol/L (ref 3.5–5.1)
Sodium: 134 mmol/L — ABNORMAL LOW (ref 135–145)
Total Bilirubin: 0.3 mg/dL (ref 0.3–1.2)
Total Protein: 4.6 g/dL — ABNORMAL LOW (ref 6.5–8.1)

## 2021-07-09 MED ORDER — SODIUM CHLORIDE (PF) 0.9 % IJ SOLN
INTRAMUSCULAR | Status: AC
  Filled 2021-07-09: qty 50

## 2021-07-09 MED ORDER — IOHEXOL 9 MG/ML PO SOLN: 500.0000 mL | ORAL | Status: AC

## 2021-07-09 MED ORDER — IOHEXOL 9 MG/ML PO SOLN
ORAL | Status: AC
  Administered 2021-07-09: 500 mL
  Filled 2021-07-09: qty 1000

## 2021-07-09 MED ORDER — JUVEN PO PACK
1.0000 | PACK | Freq: Two times a day (BID) | ORAL | Status: DC
  Administered 2021-07-14 – 2021-07-19 (×2): 1 via ORAL
  Filled 2021-07-09 (×27): qty 1

## 2021-07-09 MED ORDER — IOHEXOL 300 MG/ML  SOLN
100.0000 mL | Freq: Once | INTRAMUSCULAR | Status: AC | PRN
  Administered 2021-07-09: 100 mL via INTRAVENOUS

## 2021-07-09 NOTE — TOC Progression Note (Signed)
Transition of Care [facility]) - Progression Note    Patient Details  Name: Lorretta Kerce MRN: [id number] Date of Birth: 1945/07/27  [facility] CM/SW Contact  Ross Ludwig, [place] Phone Number: 07/09/2021, 4:10 PM  Clinical Narrative:     CSW sent updated therapy notes to facilities waiting for bed offers.  Patient does not have any bed offers for SNF placement yet.    [facility] home health can accept patient for [facility] PT and RN if no SNF offers, or patient is denied by insurance for SNF placement.  Expected Discharge Plan: [place] Barriers to Discharge: Continued Medical Work up  Expected Discharge Plan and Services Expected Discharge Plan: [place]   Discharge Planning Services: CM Consult Post Acute Care Choice: NA Living arrangements for the past 2 months: Single Family Home                                       Social Determinants of Health (SDOH) Interventions    Readmission Risk Interventions Readmission Risk Prevention Plan 07/03/2021  Transportation Screening Complete  PCP or Specialist Appt within 3-5 Days Complete  HRI or [place] Complete  Social Work Consult for [place] Planning/Counseling Complete  Palliative Care Screening Complete  Medication Review Press photographer) Complete  Some recent data might be hidden

## 2021-07-09 NOTE — Progress Notes (Signed)
PT Cancellation Note  Patient Details Name: Robin Estrada MRN: [id number] DOB: 1945/11/27   Cancelled Treatment:     Pt just got OOB with OT and reports Max pain R LE from hip to calf.  Pain meds given.  Lunch tray arrived.  Will attempt to see later today or tomorrow pending schedule.  Pt has been evaluated with rec to return home with family.    Nathanial Rancher 07/09/2021, 11:36 AM

## 2021-07-09 NOTE — Progress Notes (Signed)
Patient ID: Robin Estrada, female   DOB: 01/08/1946, 76 y.o.   MRN: [id number] [facility] Surgery Progress Note  10 Days Post-Op  Subjective: CC-  States multiple times that she just wants to sleep. Denies any pain this morning. Specifically denies abdominal pain, nausea, vomiting. Tolerating diet but still not eating much. States "I am eating enough, I never eat much."  Objective: Vital signs in last 24 hours: Temp:  [98 F (36.7 C)-98.8 F (37.1 C)] 98.3 F (36.8 C) (02/28 0454) Pulse Rate:  [102-112] 104 (02/28 0548) Resp:  [15-20] 20 (02/28 0548) BP: (120-130)/(58-71) 120/58 (02/28 0548) SpO2:  [93 %-95 %] 93 % (02/28 0548) Last BM Date : 07/08/21  Intake/Output from previous day: 02/27 0701 - 02/28 0700 In: 60 [P.O.:60] Out: 100 [Stool:100] Intake/Output this shift: No intake/output data recorded.  PE: Gen:  Alert, NAD Pulm: rate and effort normal Abd: Soft, NT/ND, ostomy pink and budded with air and soft brown stool in pouch  Lab Results:  Recent Labs    07/08/21 0507 07/09/21 0451  WBC 18.6* 23.6*  HGB 8.6* 8.5*  HCT 28.7* 27.3*  PLT 356 380   BMET Recent Labs    07/08/21 0507 07/09/21 0451  NA 134* 134*  K 4.0 4.0  CL 107 105  CO2 21* 23  GLUCOSE 125* 134*  BUN 13 12  CREATININE 0.44 0.42*  CALCIUM 8.0* 8.1*   PT/INR No results for input(s): LABPROT, INR in the last 72 hours. CMP     Component Value Date/Time   NA 134 (L) 07/09/2021 0451   K 4.0 07/09/2021 0451   CL 105 07/09/2021 0451   CO2 23 07/09/2021 0451   GLUCOSE 134 (H) 07/09/2021 0451   BUN 12 07/09/2021 0451   CREATININE 0.42 (L) 07/09/2021 0451   CREATININE 0.86 11/09/2017 0831   CALCIUM 8.1 (L) 07/09/2021 0451   PROT 4.6 (L) 07/09/2021 0451   ALBUMIN 1.6 (L) 07/09/2021 0451   AST 22 07/09/2021 0451   AST 23 11/09/2017 0831   ALT 11 07/09/2021 0451   ALT 34 11/09/2017 0831   ALKPHOS 174 (H) 07/09/2021 0451   BILITOT 0.3 07/09/2021 0451   BILITOT 0.4 11/09/2017 0831    GFRNONAA >60 07/09/2021 0451   GFRNONAA >60 11/09/2017 0831   GFRAA >60 04/28/2018 1439   GFRAA >60 11/09/2017 0831   Lipase     Component Value Date/Time   LIPASE 27 06/28/2021 1644       Studies/Results: DG Knee 1-2 Views Right  Result Date: 07/07/2021 CLINICAL DATA:  Recent fall.  RIGHT knee pain. EXAM: RIGHT KNEE - 1-2 VIEW COMPARISON:  None. FINDINGS: No evidence of fracture, dislocation, or joint effusion. No evidence of arthropathy or other focal bone abnormality. Soft tissues are unremarkable. IMPRESSION: Negative. Electronically Signed   By: Nolon Nations M.D.   On: 07/07/2021 10:34   DG HIP UNILAT WITH PELVIS 2-3 VIEWS RIGHT  Result Date: 07/07/2021 CLINICAL DATA:  Recent fall.  RIGHT hip and knee pain. EXAM: DG HIP (WITH OR WITHOUT PELVIS) 2-3V RIGHT COMPARISON:  None. FINDINGS: There is no acute fracture or subluxation. Degenerative changes are seen in both hips and in the lumbosacral region. There is diffuse body wall edema. Surgical clips overlie the abdomen and pelvis consistent with recent abdominal surgery. IMPRESSION: No acute fracture or subluxation. Electronically Signed   By: Nolon Nations M.D.   On: 07/07/2021 10:32    Anti-infectives: Anti-infectives (From admission, onward)    Start  Dose/Rate Route Frequency Ordered Stop   06/28/21 2200  piperacillin-tazobactam (ZOSYN) IVPB 3.375 g  Status:  Discontinued        3.375 g 12.5 mL/hr over 240 Minutes Intravenous Every 8 hours 06/28/21 2120 07/04/21 0841        Assessment/Plan Intussusception of cecum  S/P laparoscopic assisted partial R colectomy, end ileostomy 06/29/21 Dr. Marcello Moores POD#10 - Veteran and ileostomy working. She is not eating much at all and does not like any of the protein shakes. Add juven. Briefly discussed feeding tube and tube feedings with the patient but she said she would not be interested. - WBC is up again today. Low suspicion for intraabdominal source given no  abdominal pain but will check CT scan to make sure. - continue PT/OT - pt refusing SNF, TOC team working on home health - palliative and oncology have seen. patients goal is to [facility] - Path consistent with more metastatic disease, patient has known lung and sigmoid colon mets    FEN: Regular diet ID: Zosyn stopped 2/22 VTE: SCD's, hep gtt for ACS stopped, eliquis 2/23 for DVT Foley: out     Cardiac arrest, inferior STEMI  Metastatic breast cancer  PMH GIB HTN Acute on chronic anemia  LLE DVT - on [place] 2/23   LOS: 11 days    Wellington Hampshire, [facility] Surgery 07/09/2021, 9:56 AM Please see Amion for pager number during day hours 7:00am-4:30pm

## 2021-07-09 NOTE — Progress Notes (Addendum)
Occupational Therapy Treatment Patient Details Name: Robin Estrada MRN: [id number] DOB: Sep 30, 1945 Today's Date: 07/09/2021   History of present illness 76 year old [name] with PMH significant of lung cancer, history of breast cancer, new vertebral masses presents in the ER with several days history of worsening abdominal pain, and nausea. CT abdomen with IV contrast showed large intussusception of the cecum and distal ileum within the length of the ascending colon and subsequent dilatation of the ascending colon. There are also noted various and numerous lytic lesions throughout the thoracolumbar spine and pelvis consistent with metastatic disease. General surgery was consulted.  She underwent laparoscopic right colectomy with end ileostomy on 06/29/2021.  She had a post-op cardiac arrest and inferior STEMI-- Cardiology consulted, Not a candidate for cardiac intervention   OT comments  Patient was min guard for sitting EOB to wash UB and half of LB. Patient reported increased pain in RLE with sitting on edge of bed and movement. Nurse made aware. Patient was able to transfer with min guard to turn and min A to turn walker with patient no loss of balance noted. Patient was educated on importance of staying up out of bed. Patient verbalized understanding. Patient asked multiple times for pain medication during session. Nurse made aware. Patient would continue to benefit from skilled OT services at this time while admitted and after d/c to address noted deficits in order to improve overall safety and independence in ADLs.  Patients HR was noted to increase to 120 bpm with transfer from edge of bed to recliner in room.    Recommendations for follow up therapy are one component of a multi-disciplinary discharge planning process, led by the attending physician.  Recommendations may be updated based on patient status, additional functional criteria and insurance authorization.    Follow Up  Recommendations  Home health OT    Assistance Recommended at Discharge Frequent or constant Supervision/Assistance  Patient can return home with the following  A little help with walking and/or transfers;A lot of help with bathing/dressing/bathroom;Assistance with cooking/housework;Help with stairs or ramp for entrance;Assist for transportation   Equipment Recommendations  BSC/3in1    Recommendations for Other Services      Precautions / Restrictions Precautions Precautions: Fall Precaution Comments: R LQ ileostomy, abd surgery Restrictions Weight Bearing Restrictions: No RLE Weight Bearing: Weight bearing as tolerated       Mobility Bed Mobility Overal bed mobility: Modified Independent                  Transfers                         Balance Overall balance assessment: Needs assistance Sitting-balance support: Feet supported Sitting balance-Leahy Scale: Fair     Standing balance support: During functional activity, Reliant on assistive device for balance Standing balance-Leahy Scale: Poor                             ADL either performed or assessed with clinical judgement   ADL Overall ADL's : Needs assistance/impaired         Upper Body Bathing: Sitting;Set up   Lower Body Bathing: Sitting/lateral leans;Minimal assistance Lower Body Bathing Details (indicate cue type and reason): sitting on edge of bed with increased time. Upper Body Dressing : Set up;Sitting Upper Body Dressing Details (indicate cue type and reason): EOB Lower Body Dressing: Moderate assistance;Sit to/from stand;Sitting/lateral leans Lower Body Dressing Details (  indicate cue type and reason): patient unable to get socks on feet but able to get them on toes at bed level. Toilet Transfer: Min guard;Minimal assistance;Rolling walker (2 wheels);Ambulation Toilet Transfer Details (indicate cue type and reason): patient was min guard to transfer with physical assist  to turn walker to sit in recliner. patient required increased encouragement to stay up in recliner after session.                Extremity/Trunk Assessment              Vision       Perception     Praxis      Cognition Arousal/Alertness: Awake/alert Behavior During Therapy: Flat affect Overall Cognitive Status: Difficult to assess                                 General Comments: patient was not very conversational with increased cues to get her to answer questions        Exercises      Shoulder Instructions       General Comments      Pertinent Vitals/ Pain       Pain Assessment Pain Assessment: Faces Faces Pain Scale: Hurts little more Pain Location: RLE Pain Descriptors / Indicators: Discomfort, Sore Pain Intervention(s): Monitored during session, Repositioned  Home Living                                          Prior Functioning/Environment              Frequency  Min 2X/week        Progress Toward Goals  OT Goals(current goals can now be found in the care plan section)  Progress towards OT goals: Progressing toward goals     Plan Discharge plan remains appropriate    Co-evaluation                 AM-PAC OT "6 Clicks" Daily Activity     Outcome Measure   Help from another person eating meals?: None Help from another person taking care of personal grooming?: A Little Help from another person toileting, which includes using toliet, bedpan, or urinal?: A Little Help from another person bathing (including washing, rinsing, drying)?: A Lot Help from another person to put on and taking off regular upper body clothing?: A Little Help from another person to put on and taking off regular lower body clothing?: A Lot 6 Click Score: 17    End of Session Equipment Utilized During Treatment: Gait belt  OT Visit Diagnosis: Unsteadiness on feet (R26.81);Other abnormalities of gait and mobility  (R26.89);Muscle weakness (generalized) (M62.81)   Activity Tolerance Patient limited by pain   Patient Left in chair;with call bell/phone within reach;with chair alarm set   Nurse Communication Mobility status;Patient requests pain meds        Time: 4166-0630 OT Time Calculation (min): 20 min  Charges: OT General Charges $OT Visit: 1 Visit OT Treatments $Self Care/Home Management : 8-22 mins  Jackelyn Poling OTR/L, MS Acute Rehabilitation Department Office# [phone] Pager# [phone]   Marcellina Millin 07/09/2021, 11:51 AM

## 2021-07-09 NOTE — Progress Notes (Addendum)
°   07/09/21 1954  Assess: MEWS Score  Temp 98.7 F (37.1 C)  BP 119/61  Pulse Rate (!) 122  Resp 18  Level of Consciousness Alert  SpO2 97 %  O2 Device Room Air  Assess: MEWS Score  MEWS Temp 0  MEWS Systolic 0  MEWS Pulse 2  MEWS RR 0  MEWS LOC 0  MEWS Score 2  MEWS Score Color Yellow  Assess: SIRS CRITERIA  SIRS Temperature  0  SIRS Pulse 1  SIRS Respirations  0  SIRS WBC 0  SIRS Score Sum  1   Yellow MEWS protocol initiated despite HR being >100 for over 24hrs

## 2021-07-09 NOTE — Progress Notes (Signed)
Alerted by NT that pts HR was 122 and caused a YELLOW MEWS.  Assessment of pt finds her in pain but unable to rate the amount of pain. Medicated for pain and assisted her with eating a few bites of fish and a cookie from her meal tray then she pushed food away say that's enough. Will initiate YELLOW mews since HR remains elevated all the time now.

## 2021-07-09 NOTE — Care Management Important Message (Signed)
Important Message  Patient Details IM Letter placed in Patients room Name: Robin Estrada MRN: [id number] Date of Birth: 1946/04/06   Medicare Important Message Given:  Yes     Kerin Salen 07/09/2021, 9:57 AM

## 2021-07-09 NOTE — Progress Notes (Signed)
Progress Note   Patient: Robin Estrada KGM:[id number] DOB: 09-04-1945 DOA: 06/28/2021     11 DOS: the patient was seen and examined on 07/09/2021   Brief hospital course: 76 year old [name] with PMH significant of lung cancer, history of breast cancer, new vertebral masses presents in the ER with several days history of worsening abdominal pain, and nausea. CT abdomen with IV contrast showed large intussusception of the cecum and distal ileum within the length of the ascending colon and subsequent dilatation of the ascending colon. There are also noted various and numerous lytic lesions throughout the thoracolumbar spine and pelvis consistent with metastatic disease. General surgery was consulted.  She underwent laparoscopic right colectomy with end ileostomy on 06/29/2021. She had a post-op cardiac arrest and inferior STEMI-- Cardiology consulted, Not a candidate for cardiac intervention.   Assessment and Plan: Cecal and distal ileum intussusception with bowel obstruction: -Patient presented with severe abdominal pain associated with nausea. -S/p laparoscopic right colectomy with end ileostomy 2/18 -Completed course of zosyn -Appreciate general surgery , ileostomy working. Per CCS, leukocytosis likely not from intra-abdominal source -Now on regular diet -Ileostomy noted to be working   Inferior STEMI / Postop cardiac arrest Ischemic cardiomyopathy; -Troponin peaked to 24,000.  Cardiology following -Patient is not a good candidate for left heart cath. -TTE showed LV ejection fraction of 50%, the left ventricle demonstrated RWMA.  Mild LVH,  diastolic parameters indeterminate. -Initially on heparin gtt, since being stopped by Cardiology. Now on coreg. 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   -Patient underwent CT-guided lung biopsy on 05/14/2021- results consistent of poorly differentiated malignancy with sarcomatoid and epithelioid features.   -Continue as needed pain medications   Hypokalemia:  -Replaced -recheck bmet in AM   Hypomagnesemia:  -Replaced   History of GI bleed: -Continue pantoprazole   Essential hypertension: -Noted to have refused imdur -Continue Coreg 6.25 twice daily.   Hyponatremia -Normalized   Acute blood loss anemia -H&H dropped from 10.9-8.5.  Status post 1 unit blood transfusion on 2/19. -Hemoglobin overall stable, currently 8.5   Aortic atherosclerosis: Noted on recent CT   Cystic mass of pancreas: -Previous CA 19-9 level normal. -Follow-up outpatient.  Acute LLE DVT -Noted and reviewed on LE dopplers -Discussed with General Surgery. OK for anticoagulation -Now on eliquis. Case was discussed with Oncology  Leukocytosis -Without abd pain, dysuria, cough or fevers -suspecting reactive leukocytosis as no obvious source of infection is identified -CT abd ordered per General Surgery, pending   Foley removed   Poor overall prognosis, palliative care consulted.      Subjective: Complaining of RLE pain  Physical Exam: Vitals:   07/08/21 1400 07/08/21 2028 07/09/21 0454 07/09/21 0548  BP: 124/61 130/64 127/71 (!) 120/58  Pulse: (!) 109 (!) 102 (!) 112 (!) 104  Resp: 16 15 15 20   Temp: 98.8 F (37.1 C) 98 F (36.7 C) 98.3 F (36.8 C)   TempSrc: Oral Oral Oral   SpO2: 95% 94% 94% 93%  Weight:      Height:       General exam: Awake, laying in bed, in nad Respiratory system: Normal respiratory effort, no wheezing Cardiovascular system: regular rate, s1, s2 Gastrointestinal system: Soft, nondistended, positive BS Central  nervous system: CN2-12 grossly intact, strength intact Extremities: Perfused, no clubbing Skin: Normal skin turgor, no  notable skin lesions seen Psychiatry: Mood normal // no visual hallucinations   Data Reviewed:  Labs reviewed, Na 132  Family Communication: Pt in room, family not at bedside  Disposition: Status is: Inpatient Remains inpatient appropriate because: Severity of illness   Planned Discharge Destination:  SNF vs HHPT     Author: Marylu Lund, MD 07/09/2021 2:54 PM  For on call review [email or web address].

## 2021-07-10 ENCOUNTER — Inpatient Hospital Stay (HOSPITAL_COMMUNITY): Payer: Medicare HMO

## 2021-07-10 DIAGNOSIS — Z7189 Other specified counseling: Secondary | ICD-10-CM | POA: Diagnosis not present

## 2021-07-10 DIAGNOSIS — Z515 Encounter for palliative care: Secondary | ICD-10-CM | POA: Diagnosis not present

## 2021-07-10 DIAGNOSIS — K561 Intussusception: Secondary | ICD-10-CM | POA: Diagnosis not present

## 2021-07-10 DIAGNOSIS — R531 Weakness: Secondary | ICD-10-CM | POA: Diagnosis not present

## 2021-07-10 LAB — COMPREHENSIVE METABOLIC PANEL
ALT: 10 U/L (ref 0–44)
AST: 22 U/L (ref 15–41)
Albumin: 1.6 g/dL — ABNORMAL LOW (ref 3.5–5.0)
Alkaline Phosphatase: 182 U/L — ABNORMAL HIGH (ref 38–126)
Anion gap: 6 (ref 5–15)
BUN: 11 mg/dL (ref 8–23)
CO2: 23 mmol/L (ref 22–32)
Calcium: 7.9 mg/dL — ABNORMAL LOW (ref 8.9–10.3)
Chloride: 102 mmol/L (ref 98–111)
Creatinine, Ser: 0.49 mg/dL (ref 0.44–1.00)
GFR, Estimated: >60 mL/min (ref >60)
Glucose, Bld: 134 mg/dL — ABNORMAL HIGH (ref 70–99)
Potassium: 3.9 mmol/L (ref 3.5–5.1)
Sodium: 131 mmol/L — ABNORMAL LOW (ref 135–145)
Total Bilirubin: 0.2 mg/dL — ABNORMAL LOW (ref 0.3–1.2)
Total Protein: 4.5 g/dL — ABNORMAL LOW (ref 6.5–8.1)

## 2021-07-10 LAB — APTT
aPTT: 46 seconds — ABNORMAL HIGH (ref 24–36)
aPTT: 189 seconds (ref 24–36)

## 2021-07-10 LAB — CBC
HCT: 25.8 % — ABNORMAL LOW (ref 36.0–46.0)
Hemoglobin: 7.8 g/dL — ABNORMAL LOW (ref 12.0–15.0)
MCH: 27.1 pg (ref 26.0–34.0)
MCHC: 30.2 g/dL (ref 30.0–36.0)
MCV: 89.6 fL (ref 80.0–100.0)
Platelets: 349 10*3/uL (ref 150–400)
RBC: 2.88 MIL/uL — ABNORMAL LOW (ref 3.87–5.11)
RDW: 27.9 % — ABNORMAL HIGH (ref 11.5–15.5)
WBC: 21.1 10*3/uL — ABNORMAL HIGH (ref 4.0–10.5)
nRBC: 0 % (ref 0.0–0.2)

## 2021-07-10 LAB — HEPARIN LEVEL (UNFRACTIONATED): Heparin Unfractionated: >1.1 IU/mL — ABNORMAL HIGH (ref 0.30–0.70)

## 2021-07-10 MED ORDER — HEPARIN (PORCINE) 25000 UT/250ML-% IV SOLN
900.0000 [IU]/h | INTRAVENOUS | Status: DC
  Administered 2021-07-10 – 2021-07-11 (×2): 950 [IU]/h via INTRAVENOUS
  Administered 2021-07-12: 900 [IU]/h via INTRAVENOUS
  Filled 2021-07-10 (×5): qty 250

## 2021-07-10 MED ORDER — CARVEDILOL 6.25 MG PO TABS
6.2500 mg | ORAL_TABLET | Freq: Once | ORAL | Status: AC
  Administered 2021-07-10: 6.25 mg via ORAL
  Filled 2021-07-10: qty 1

## 2021-07-10 MED ORDER — MIRTAZAPINE 15 MG PO TBDP
15.0000 mg | ORAL_TABLET | Freq: Every day | ORAL | Status: DC
  Administered 2021-07-10 – 2021-07-24 (×14): 15 mg via ORAL
  Filled 2021-07-10 (×17): qty 1

## 2021-07-10 MED ORDER — OXYCODONE HCL 5 MG PO TABS
5.0000 mg | ORAL_TABLET | Freq: Once | ORAL | Status: AC
  Administered 2021-07-10: 5 mg via ORAL
  Filled 2021-07-10: qty 1

## 2021-07-10 MED ORDER — GADOBUTROL 1 MMOL/ML IV SOLN
6.1000 mL | Freq: Once | INTRAVENOUS | Status: AC | PRN
  Administered 2021-07-10: 6.1 mL via INTRAVENOUS

## 2021-07-10 MED ORDER — OXYCODONE HCL 5 MG PO TABS
5.0000 mg | ORAL_TABLET | ORAL | Status: DC | PRN
  Administered 2021-07-10 – 2021-07-25 (×35): 5 mg via ORAL
  Filled 2021-07-10 (×38): qty 1

## 2021-07-10 MED ORDER — CARVEDILOL 12.5 MG PO TABS
12.5000 mg | ORAL_TABLET | Freq: Two times a day (BID) | ORAL | Status: DC
  Administered 2021-07-10 – 2021-07-14 (×9): 12.5 mg via ORAL
  Filled 2021-07-10 (×10): qty 1

## 2021-07-10 MED ORDER — PROSOURCE PLUS PO LIQD
30.0000 mL | Freq: Three times a day (TID) | ORAL | Status: DC
  Administered 2021-07-14 – 2021-07-21 (×6): 30 mL via ORAL
  Filled 2021-07-10 (×15): qty 30

## 2021-07-10 NOTE — Progress Notes (Signed)
Patient ID: Robin Estrada, female   DOB: December 23, 1945, 76 y.o.   MRN: [id number] ?[facility] ?Progress Note ? ?11 Days Post-Op  ?Subjective: ?CC-  ?Complains of pain in her legs. Denies abdominal pain. No n/v. Tolerating diet but still not eating a lot. She does not want to talk much this morning, but it's documented that she ate 75% breakfast, 25% lunch, 10% dinner yesterday. ? ?Objective: ?Vital signs in last 24 hours: ?Temp:  [97.7 ?F (36.5 ?C)-98.7 ?F (37.1 ?C)] 98.1 ?F (36.7 ?C) (03/01 0715) ?Pulse Rate:  [106-122] 106 (03/01 0715) ?Resp:  [16-19] 18 (03/01 0715) ?BP: (104-119)/(50-61) 112/58 (03/01 0715) ?SpO2:  [94 %-97 %] 96 % (03/01 0715) ?Weight:  [60.9 kg] 60.9 kg (03/01 0500) ?Last BM Date : 07/09/21 ? ?Intake/Output from previous day: ?02/28 0701 - 03/01 0700 ?In: 31 [P.O.:296] ?Out: -  ?Intake/Output this shift: ?No intake/output data recorded. ? ?PE: ?Gen:  Alert, NAD ?Pulm: rate and effort normal ?Abd: Soft, NT/ND, ostomy pink and budded with air and small amount soft brown stool in pouch ? ?Lab Results:  ?Recent Labs  ?  07/09/21 ?0451 07/10/21 ?0500  ?WBC 23.6* 21.1*  ?HGB 8.5* 7.8*  ?HCT 27.3* 25.8*  ?PLT 380 349  ? ?BMET ?Recent Labs  ?  07/09/21 ?0451 07/10/21 ?0500  ?NA 134* 131*  ?K 4.0 3.9  ?CL 105 102  ?CO2 23 23  ?GLUCOSE 134* 134*  ?BUN 12 11  ?CREATININE 0.42* 0.49  ?CALCIUM 8.1* 7.9*  ? ?PT/INR ?No results for input(s): LABPROT, INR in the last 72 hours. ?CMP  ?   ?Component Value Date/Time  ? NA 131 (L) 07/10/2021 0500  ? K 3.9 07/10/2021 0500  ? CL 102 07/10/2021 0500  ? CO2 23 07/10/2021 0500  ? GLUCOSE 134 (H) 07/10/2021 0500  ? BUN 11 07/10/2021 0500  ? CREATININE 0.49 07/10/2021 0500  ? CREATININE 0.86 11/09/2017 0831  ? CALCIUM 7.9 (L) 07/10/2021 0500  ? PROT 4.5 (L) 07/10/2021 0500  ? ALBUMIN 1.6 (L) 07/10/2021 0500  ? AST 22 07/10/2021 0500  ? AST 23 11/09/2017 0831  ? ALT 10 07/10/2021 0500  ? ALT 34 11/09/2017 0831  ? ALKPHOS 182 (H) 07/10/2021 0500  ? BILITOT 0.2  (L) 07/10/2021 0500  ? BILITOT 0.4 11/09/2017 0831  ? GFRNONAA >60 07/10/2021 0500  ? GFRNONAA >60 11/09/2017 0831  ? GFRAA >60 04/28/2018 1439  ? GFRAA >60 11/09/2017 0831  ? ?Lipase  ?   ?Component Value Date/Time  ? LIPASE 27 06/28/2021 1644  ? ? ? ? ? ?Studies/Results: ?CT ABDOMEN PELVIS W CONTRAST ? ?Result Date: 07/09/2021 ?CLINICAL DATA:  Abdominal pain. Postoperative. History of lung cancer. EXAM: CT ABDOMEN AND PELVIS WITH CONTRAST TECHNIQUE: Multidetector CT imaging of the abdomen and pelvis was performed using the standard protocol following bolus administration of intravenous contrast. RADIATION DOSE REDUCTION: This exam was performed according to the departmental dose-optimization program which includes automated exposure control, adjustment of the mA and/or kV according to patient size and/or use of iterative reconstruction technique. CONTRAST:  129mL OMNIPAQUE IOHEXOL 300 MG/ML  SOLN COMPARISON:  Pelvis and right hip radiographs 07/07/2021 CT abdomen and pelvis 06/28/2021 FINDINGS: Lower chest: New mild-to-moderate right and mild left pleural effusions with associated subsegmental atelectasis. Unchanged curvilinear scarring within the medial middle lobe and anteromedial lingula. Coronary artery calcifications are noted. Mild cardiomegaly, unchanged. Hepatobiliary: There are numerous low-density lesions seen throughout the liver. Most of these are too small to further characterize however the largest  within the right hepatic lobe measures approximally 14 mm (axial series 2, image 16) and demonstrates density greater than fluid suspicious for a metastasis. The gallbladder is unremarkable. No intrahepatic or extrahepatic biliary ductal dilatation. Pancreas: No significant change in fluid density cyst within the pancreatic tail measuring up to 2.4 x 1.9 cm. No pancreatic ductal dilatation is seen. Spleen: Normal in size without focal abnormality. Adrenals/Urinary Tract: Adrenal glands are unremarkable. The  kidneys enhance uniformly and are symmetric in size without hydronephrosis. No renal stone is seen. Multiple subcentimeter low-attenuation lesions too small to further characterize. A left lower pole 12 mm low-attenuation lesion is unchanged from prior and compatible with a cyst but not well evaluated on the current study. No focal urinary bladder wall thickening. Mild nondependent air within the urinary bladder lumen. Recommend clinical correlation for recent instrumentation which would explain this air. Stomach/Bowel: Postsurgical changes are seen of resection of the ascending colon. The more distal colon is diffusely decompressed. There is a right upper quadrant ventral ostomy. There is a new left hemiabdomen small bowel intussusception along an approximate 6 cm length (coronal series 6, image 45, axial series 2 images 42 through 49). Oral contrast does mildly extend past this point. The more proximal small bowel is mildly distended up to 2.6 cm in the more distal small bowel is decompressed. Vascular/Lymphatic: No abdominal aortic aneurysm. Moderate atherosclerotic calcifications. Normal variant retroaortic left renal vein. No mesenteric, retroperitoneal, or pelvic lymphadenopathy. Reproductive: The uterus is present. Mild uterine heterogeneity bubbly uterine fibroids. No ovarian mass is identified. Other: Small volume ascites.No pneumoperitoneum. New moderate anasarca. Musculoskeletal: High-grade T11 vertebral body height loss unchanged. Innumerable lytic lesions are seen throughout the visualized spine and pelvis. This includes unchanged dominant left ilium lesion measuring up to 2.9 cm in transverse dimension (axial series 2, image 59) and dominant lytic lesions involving the mid and posterior aspect of the L4 vertebral body (sagittal images 71 through 76). IMPRESSION: Compared to 06/28/2021: 1. Interval resection of the ascending colon region of prior intussusception and new right upper quadrant ileostomy.  2. There is a new short-segment intussusception seen within small bowel within the left hemiabdomen with mild upstream bowel dilatation. Oral contrast appears to extend through this region. 3. Numerous low-density lesions are seen throughout the liver suspicious for metastatic disease. 4. New mild-to-moderate right and mild left pleural effusions. 5. Innumerable osseous metastases. Electronically Signed   By: Yvonne Kendall M.D.   On: 07/09/2021 20:28   ? ?Anti-infectives: ?Anti-infectives (From admission, onward)  ? ? Start     Dose/Rate Route Frequency Ordered Stop  ? 06/28/21 2200  piperacillin-tazobactam (ZOSYN) IVPB 3.375 g  Status:  Discontinued       ? 3.375 g ?12.5 mL/hr over 240 Minutes Intravenous Every 8 hours 06/28/21 2120 07/04/21 0841  ? ?  ? ? ? ?Assessment/Plan ?Intussusception of cecum  ?S/P laparoscopic assisted partial R colectomy, end ileostomy 06/29/21 Dr. Marcello Moores ?POD#11 ?- palliative and oncology have seen. patients goal is to go home with [facility] ?- Path consistent with more metastatic disease, patient has known lung and sigmoid colon mets ?- WBC up so CT scan was repeated yesterday, overall ok, new short-segment intussusception seen within small bowel but this is nonobstructive and no complications noted at surgical area. Abdominal exam is benign. Tolerating diet and ileostomy working. No indication for any further surgery. ?- Continues to have poor PO intake. Previously not interested in feeding tube. I called and spoke with the patient's son. He feels  that her pain is contributing to her FTT and lack of eating. If we can improve her pain control she may be more motivated. I spoke with the primary team earlier today and he has reached out to oncology and palliative medicine to reengage.  ? ?FEN: Regular diet ?ID: Zosyn stopped 2/22 ?VTE: SCD's, hep gtt for ACS stopped, eliquis 2/23 for DVT ?Foley: out   ?  ?Cardiac arrest, inferior STEMI  ?Metastatic breast cancer  ?PMH GIB ?HTN ?Acute on chronic  anemia  ?LLE DVT - on [place] 2/23 ? ? LOS: 12 days  ? ? ?Wellington Hampshire, PA-C ?[facility] ?07/10/2021, 9:49 AM ?Please see Amion for pager number during day hours 7:00am-4:30pm ? ?

## 2021-07-10 NOTE — Progress Notes (Signed)
PHARMACY - HEPARIN (brief note) ? ?IV heparin @ 950 units/hr started this evening for patient with DVT while apixaban on hold for surgery. ? ?Heparin was started at previously therapeutic rate ? ?APTT drawn @ 19:28 and results available @ 22:50 ?aPTT = 189 sec ? ?Spoke with RN and question accuracy of lab draw ? ?Have placed an order for a STAT redraw from arm opposite of infusing heparin ? ?Will f/u result of redrawn aPTT and adjust heparin dose as needed ? ?Leone Haven, PharmD ?07/10/2021 @ 23:02 ?

## 2021-07-10 NOTE — Progress Notes (Signed)
Physical Therapy Treatment ?Patient Details ?Name: Robin Estrada ?MRN: [id number] ?DOB: 27-Jan-1946 ?Today's Date: 07/10/2021 ? ? ?History of Present Illness 76 year old [name] with PMH significant of lung cancer, history of breast cancer, new vertebral masses presents in the ER with several days history of worsening abdominal pain, and nausea. CT abdomen with IV contrast showed large intussusception of the cecum and distal ileum within the length of the ascending colon and subsequent dilatation of the ascending colon. There are also noted various and numerous lytic lesions throughout the thoracolumbar spine and pelvis consistent with metastatic disease. General surgery was consulted.  She underwent laparoscopic right colectomy with end ileostomy on 06/29/2021.  She had a post-op cardiac arrest and inferior STEMI-- Cardiology consulted, Not a candidate for cardiac intervention ? ?  ?PT Comments  ? ? 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  Pt stated she feels so "tired" and keeps requesting pain meds (Pt says OXY) for R thigh pain.  Assisted to EOB pt c/o MAX dizziness.  Took vitals.  BP sitting 115/62, HR 96 and standing BP 92/51 HR 109.  Pt was unable to take any functional steps due to weakness, increased dizziness and increased R LE thigh pain.  Assisted back to bed and positioned to comfort. All pt repeats is "OXY", "OXY".   ?Recommendations for follow up therapy are one component of a multi-disciplinary discharge planning process, led by the attending physician.  Recommendations may be updated based on patient status, additional functional criteria and insurance authorization. ? ?Follow Up Recommendations ? Home health PT ? Pt may need SNF is she continues to degress ?  ?Assistance Recommended at Discharge    ?Patient can return home with the following Assistance with cooking/housework;Direct supervision/assist for medications management;Help with stairs or ramp for entrance;Assist for  transportation;A little help with walking and/or transfers;A little help with bathing/dressing/bathroom ?  ?Equipment Recommendations ? Rolling walker (2 wheels);BSC/3in1  ?  ?Recommendations for Other Services   ? ? ?  ?Precautions / Restrictions Precautions ?Precautions: Fall ?Precaution Comments: R LQ ileostomy  ?  ? ?Mobility ? Bed Mobility ?Overal bed mobility: Needs Assistance ?Bed Mobility: Supine to Sit, Sit to Supine ?  ?  ?Supine to sit: Min assist, HOB elevated ?Sit to supine: Min assist, Mod assist ?  ?General bed mobility comments: increased time and increased assist back to bed. ?  ? ?Transfers ?Overall transfer level: Needs assistance ?Equipment used: Rolling walker (2 wheels) ?Transfers: Sit to/from Stand ?Sit to Stand: Max assist ?  ?  ?  ?  ?  ?General transfer comment: pt required MAX assist this session to briefly stand long enough to take BP. ?  ? ?Ambulation/Gait ?  ?  ?  ?  ?  ?  ?  ?General Gait Details: not able to amb due to MAC c/o R thigh pain and dizziness. ? ? ?Stairs ?  ?  ?  ?  ?  ? ? ?Wheelchair Mobility ?  ? ?Modified Rankin (Stroke Patients Only) ?  ? ? ?  ?Balance   ?  ?  ?  ?  ?  ?  ?  ?  ?  ?  ?  ?  ?  ?  ?  ?  ?  ?  ?  ? ?  ?Cognition Arousal/Alertness: Awake/alert ?Behavior During Therapy: [facility] for tasks assessed/performed ?Overall Cognitive Status: Within Functional Limits for tasks assessed ?  ?  ?  ?  ?  ?  ?  ?  ?  ?  ?  ?  ?  ?  ?  ?  ?  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  Pt stated she feels so "tired" and keeps requesting pain meds (Pt says OXY) for R thigh pain ?  ?  ? ?  ?Exercises   ? ?  ?General Comments   ?  ?  ? ?Pertinent Vitals/Pain Pain Assessment ?Pain Assessment: 0-10 ?Pain Score: 8  ?Pain Location: R LE pt rubbing her thigh ?Pain Descriptors / Indicators: Discomfort, Aching ?Pain Intervention(s): Monitored during session, Repositioned, Patient requesting pain meds-RN notified, Premedicated before session  ? ? ?Home Living   ?  ?  ?  ?   ?  ?  ?  ?  ?  ?   ?  ?Prior Function    ?  ?  ?   ? ?PT Goals (current goals can now be found in the care plan section) Progress towards PT goals: Progressing toward goals ? ?  ?Frequency ? ? ? Min 3X/week ? ? ? ?  ?PT Plan Current plan remains appropriate  ? ? ?Co-evaluation   ?  ?  ?  ?  ? ?  ?AM-PAC PT "6 Clicks" Mobility   ?Outcome Measure ? Help needed turning from your back to your side while in a flat bed without using bedrails?: A Lot ?Help needed moving from lying on your back to sitting on the side of a flat bed without using bedrails?: A Lot ?Help needed moving to and from a bed to a chair (including a wheelchair)?: A Lot ?Help needed standing up from a chair using your arms (e.g., wheelchair or bedside chair)?: A Lot ?Help needed to walk in hospital room?: A Lot ?Help needed climbing 3-5 steps with a railing? : Total ?6 Click Score: 11 ? ?  ?End of Session Equipment Utilized During Treatment: Gait belt ?Activity Tolerance: Patient limited by fatigue ?Patient left: in bed;with bed alarm set;with call bell/phone within reach ?Nurse Communication: Mobility status ?PT Visit Diagnosis: Muscle weakness (generalized) (M62.81);Difficulty in walking, not elsewhere classified (R26.2);Other abnormalities of gait and mobility (R26.89) ?  ? ? ?Time: 7262-0355 ?PT Time Calculation (min) (ACUTE ONLY): 16 min ? ?Charges:  $Therapeutic Activity: 8-22 mins          ?          ? ?Rica Koyanagi  PTA ?Acute  Rehabilitation Services ?Pager      [phone] ?Office      [phone] ? ?

## 2021-07-10 NOTE — Progress Notes (Signed)
Nutrition Follow-up ? ?INTERVENTION:  ? ?-Continue Boost Breeze and Juven ? ?-Prosource Plus PO TID, each provides 100 kcals and 15g protein ? ?-Multivitamin with minerals daily ? ?NUTRITION DIAGNOSIS:  ? ?Increased nutrient needs related to cancer and cancer related treatments, post-op healing as evidenced by estimated needs. ? ?Ongoing. ? ?GOAL:  ? ?Patient will meet greater than or equal to 90% of their needs ? ?Progressing. ? ?MONITOR:  ? ?PO intake, Supplement acceptance, Labs, Weight trends ? ?ASSESSMENT:  ? ?76 year old female with medical history of lung cancer, hx of breast cancer, new vertebral masses, HTN, mitral valve prolapse, heart murmur, and tuberculosis. She presented to the ED due to several days of worsening abdominal pain and nausea. CT abdomen showed large intussusception of the cecum and distal ileum within the ascending colon, dilatation of the ascending colon, and various and numerous lytic lesions throughout the thoracolumbar spine and pelvis consistent with metastatic disease. General surgery was consulted.  She underwent laparoscopic right colectomy with end ileostomy on 06/29/2021. She had a post-op cardiac arrest and inferior STEMI-- Cardiology consulted, not a candidate for cardiac intervention. ? ?Patient not accepting Boost Breeze or Juven. Didn't drink Ensure Surgery when it was ordered. Will trial Prosource if given like medications. ?Pt's family feels pt's pain is main barrier to PO intakes.  ? ?Intakes for 2/28: ?B: 75% ~650 kcals, 24g protein ?L: 25% ~160 kcals, 6g protein ?D: 10% ~75 kcals, 3g protein ?Total: 885 kcals, 33g protein ? ?Per surgery note, pt refused feeding tube. ?Will continue to monitor. ? ?Admission weight: 121 lbs. ?Current weight: 134 lbs ? ?Medications:  ?Labs reviewed: ? Low Na ? ?Diet Order:   ?Diet Order   ? ?       ?  Diet regular Room service appropriate? Yes; Fluid consistency: Thin  Diet effective now       ?  ? ?  ?  ? ?  ? ? ?EDUCATION NEEDS:  ? ?No  education needs have been identified at this time ? ?Skin:  Skin Assessment: Skin Integrity Issues: ?Skin Integrity Issues:: Incisions ?Incisions: abdomen (2/18) ? ?Last BM:  2/28 -ileostomy ? ?Height:  ? ?Ht Readings from Last 1 Encounters:  ?07/07/21 5' (1.524 m)  ? ? ?Weight:  ? ?Wt Readings from Last 1 Encounters:  ?07/10/21 60.9 kg  ? ? ?Ideal Body Weight:  45.45 kg ? ?BMI:  Body mass index is 26.22 kg/m?. ? ?Estimated Nutritional Needs:  ? ?Kcal:  1900-2150 kcal ? ?Protein:  105-120 grams ? ?Fluid:  >/= 2.2 L/day ? ? ?Clayton Bibles, MS, RD, LDN ?Inpatient Clinical Dietitian ?Contact information available via Amion ? ?

## 2021-07-10 NOTE — Progress Notes (Signed)
?Progress Note ? ? ?Patient: Robin Estrada EKC:[id number] DOB: Jul 08, 1945 DOA: 06/28/2021     12 ?DOS: the patient was seen and examined on 07/10/2021 ?  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  ?CT abdomen with IV contrast showed large intussusception of the cecum and distal ileum within the length of the ascending colon and subsequent dilatation of the ascending colon.  ?There are also noted various and numerous lytic leisons throughout the thoracolumbar spine and pelvis consistent with metastatic disease.  ?General surgery was consulted.  She underwent laparoscopic right colectomy with end ileostomy on 06/29/2021. ?She had a post-op cardiac arrest and inferior STEMI-- Cardiology consulted, Not a candidate for cardiac intervention.  ? ?Assessment and Plan: ?Large Cecal and distal ileum intussusception with bowel obstruction: ?-Patient presented with severe abdominal pain associated with nausea. ?-S/p laparoscopic right colectomy with end ileostomy 2/18 ?-Completed course of zosyn ?-Appreciate general surgery , ileostomy working. Per CCS, leukocytosis likely not from intra-abdominal source--- CT repeat 3/1 shows small area of intussusception but she does have metastatic disease ?-It appears her pain is under control on oral oxy ?  ?Inferior STEMI / Postop cardiac arrest ?Ischemic cardiomyopathy; ?-Troponin peaked to 24,000.  Cardiology following ?-TTE showed LV ejection fraction of 50%, the left ventricle demonstrated RWMA.  Mild LVH,  diastolic parameters indeterminate. ?--On heparin gtt.-when all procedures performed continue Eliquis, no aspirin ?-Pt refusing statin, zetia, imdur ?-Cardiology feels that she is a poor cath candidate and have signed off ?  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   ?-Patient underwent CT-guided lung biopsy on 05/14/2021- results consistent of poorly differentiated malignancy with sarcomatoid and epithelioid features.   ?-scheduled in am 3/2 for osteocool and kyphoplasty at least at T11, but also potentially at L3, L4 and L5  ?appreciate IR colleague input and coordination of this by Dr. Henrene Hawking you ?-Patient is not accepting of hospice philosophy despite detailed discussions by her oncologist ?  ?Hypokalemia:  ?-Replaced ?  ?Hypomagnesemia:  ?-Periodic rechecks ?  ?History of GI bleed: ?-Continue pantoprazole ?  ?Essential hypertension: ?-Noted to have refused imdur ?-Continue Coreg 6.25 twice daily. ?  ?Hyponatremia ?-Normalized ?  ?Acute blood loss anemia ?-Likely postoperative and dilutional component currently hemoglobin 7.8-monitor ?  ?Aortic atherosclerosis: Noted on recent CT ?  ?Cystic mass of pancreas: ?-Previous CA 19-9 level normal. ?-Follow-up outpatient. ? ?Acute LLE DVT ?-Noted and reviewed on LE dopplers ?- ? ?Leukocytosis ?-Without abd pain, dysuria, cough or fevers ?-suspecting reactive leukocytosis ,  ?  ?Foley removed ?  ?Poor overall prognosis, palliative care consulted  And appreciate input-I tried to have a detailed discussion with the patient today at the bedside-she was not really willing to engage with me-I explained to her my role as attending physician and wanted to ask her wishes-I interviewed the patient with general surgery PA-it appears that she is more withdrawn and not really engaging-this is a change from prior ?we will try Remeron to see if this helps with mood/appetite-I fear that the patient's burden of illness in the setting of cardiomyopathy intussusception and overall failure to thrive may place her at high risk for recurrent hospitalizations and comorbidities-we will continue to work with  her  as she allows ?  ? ? ? ?Subjective: Complaining of RLE pain ? ?Physical Exam: ?Vitals:  ? 07/10/21 0332 07/10/21 0500 07/10/21 0715 07/10/21 0930  ?BP: 119/60  (!) 112/58 111/61  ?Pulse: (!) 110  (!) 106   ?Resp: 16  18   ?Temp: 97.7 ?F (36.5 ?C)  98.1 ?F (36.7 ?C)   ?TempSrc: Oral  Oral   ?SpO2: 95%  96%   ?Weight:  60.9 kg    ?Height:      ? ?General exam: Awake, laying in bed, in nad ?Respiratory system: Normal respiratory effort, no wheezing ?Cardiovascular system: regular rate, s1, s2 ?Gastrointestinal system: Soft, nondistended, positive BS ?Central nervous system: CN2-12 grossly intact, strength intact ?Extremities: Perfused, no clubbing ?Skin: Normal skin turgor, no notable skin lesions seen ?Psychiatry: Mood normal // no visual hallucinations  ? ?Data Reviewed: ? ?Labs reviewed, Na 132 ? ?Family Communication: Pt in room, family not at bedside ? ?Disposition: ?Status is: Inpatient ?Remains inpatient appropriate because: Severity of illness ? ? Planned Discharge Destination:  SNF vs HHPT ? ? ? ? ?Author: ?Nita Sells, MD ?07/10/2021 5:35 PM ? ?For on call review [email or web address]  ? ?

## 2021-07-10 NOTE — Progress Notes (Signed)
ANTICOAGULATION CONSULT NOTE   ? ?Pharmacy Consult for heparin  bridge therapy while apixaban on hold ?Indication: DVT ? ?Allergies  ?Allergen Reactions  ? Statins Nausea And Vomiting and Other (See Comments)  ?  MUSCLE PAIN  ? Amlodipine   ?  fatigue  ? Clonidine Derivatives   ?  headache  ? Losartan   ?  abd pain  ? Sulfa Antibiotics Nausea Only  ? Sulfamethoxazole-Trimethoprim Nausea Only  ?  " Severe Nausea "  ? ? ?Patient Measurements: ?Height: 5" (152.4 cm) ?Height: 5' (152.4 cm) ?Weight: 60.9 kg (134 lb 4.2 oz) ?IBW/kg (Calculated) : 45.5 ? ? ?Vital Signs: ?Temp: 98.1 ?F (36.7 ?C) (03/01 0715) ?Temp Source: Oral (03/01 0715) ?BP: 112/58 (03/01 0715) ?Pulse Rate: 106 (03/01 0715) ? ?Labs: ?Recent Labs  ?  07/08/21 ?0507 07/09/21 ?0451 07/10/21 ?0500  ?HGB 8.6* 8.5* 7.8*  ?HCT 28.7* 27.3* 25.8*  ?PLT 356 380 349  ?CREATININE 0.44 0.42* 0.49  ? ? ? ?Estimated Creatinine Clearance: 48.8 mL/min (by C-G formula based on SCr of 0.49 mg/dL). ? ? ?Medical History: ?Past Medical History:  ?Diagnosis Date  ? Breast mass, left   ? Large  ? Cancer [facility])   ? FIBROMYOMA  AGE 1-   BENIGN  RESOLVED  ? GI bleed 05/03/2021  ? 1/23 continue with pantoprazole  ? Headache   ? Heart murmur   ? Hypertension   ? Mitral valve prolapse   ? Tuberculosis   ? AS CHILD  W/ TX  ? ? ?Medications:  ?No anticoagulants PTA  ? ?Assessment: ?76 y/o female with PMH of metastatic breast cancer who is post-op lap R colectomy, end ileostomy.  Experienced STEMI and cardiac arrest in PACU on 2/18. Started on Aspirin 81mg  daily and received 48 hours of IV heparin 2/19-2/21, then transitioned to Enoxaparin 40mg  SQ q24h for VTE prophylaxis (last dose 2/24 at 1132). Bilateral lower extremity venous duplex today + for LLE DVT. Pharmacy consulted for Apixaban dosing on 07/04/21. ? ?Today, 3/1, pharmacy is consulted to dose heparin for bridge therapy while apixaban is on hold x 48 hours for IR procedure  ?Last dose apixaban 10 mg 2/28 @ 2208 ?Hg trending down  @ 7.8, PLT WNL @ 349, SCr WNL ? ?Plan:  ?Draw baseline aPTT & heparin level now ?Will titrate using aPTTs as heparin level will be elevated due to apixaban use ?After baseline labs are draw will start heparin at 950 units/hr and check 8 hr aPTT - she was previously therapeutic on this rate on 07/02/21. ?Daily aPTT/HL/CBC while on heparin ? ?Eudelia Bunch, Pharm.D ?07/10/2021 8:52 AM ? ?

## 2021-07-10 NOTE — Progress Notes (Signed)
Robin Estrada   DOB:Apr 22, 1946   CW#:[id number]   VVO#:[id number]  Oncology follow up   Subjective: Robin Estrada complains her back and hip pain, which response to oxycodone, but it does not last long.  She has been in bed most of time.  She states her appetite is normal, but overall very fatigued, not being able to out of bed to walk much due to the pain.   Objective:  Vitals:   07/10/21 1507 07/10/21 2237  BP: (!) 94/52 106/60  Pulse: 96 93  Resp: 16 17  Temp: 98 F (36.7 C) 97.7 F (36.5 C)  SpO2: 95% 93%    Body mass index is 26.22 kg/m.  Intake/Output Summary (Last 24 hours) at 07/10/2021 2254 Last data filed at 07/10/2021 1549 Gross per 24 hour  Intake 395.48 ml  Output --  Net 395.48 ml     Sclerae unicteric Abdomen soft, with some tenderness to palpation, ileostomy in place  (+) left UE edema   Awake and alert  CBG (last 3)  No results for input(s): GLUCAP in the last 72 hours.   Labs:  Urine Studies No results for input(s): UHGB, CRYS in the last 72 hours.  Invalid input(s): UACOL, UAPR, USPG, UPH, UTP, UGL, UKET, UBIL, UNIT, UROB, [place], UEPI, UWBC, Junie Panning [place], [place], [place]  Basic Metabolic Panel: Recent Labs  Lab 07/05/21 0358 07/06/21 0338 07/07/21 0903 07/08/21 0507 07/09/21 0451 07/10/21 0500  NA 135 131* 132* 134* 134* 131*  K 4.2 3.7 4.0 4.0 4.0 3.9  CL 110 106 104 107 105 102  CO2 21* 19* 21* 21* 23 23  GLUCOSE 116* 145* 129* 125* 134* 134*  BUN 12 15 15 13 12 11   CREATININE 0.49 0.54 0.54 0.44 0.42* 0.49  CALCIUM 7.6* 7.8* 8.3* 8.0* 8.1* 7.9*  MG 2.1  --   --   --   --   --    GFR Estimated Creatinine Clearance: 48.8 mL/min (by C-G formula based on SCr of 0.49 mg/dL). Liver Function Tests: Recent Labs  Lab 07/06/21 0338 07/07/21 0903 07/08/21 0507 07/09/21 0451 07/10/21 0500  AST 20 22 19 22 22   ALT 12 12 10 11 10   ALKPHOS 199* 247* 188* 174* 182*  BILITOT 0.3 0.4 0.2* 0.3 0.2*  PROT 4.3* 5.2* 4.3* 4.6* 4.5*  ALBUMIN 1.6* 1.9*  1.6* 1.6* 1.6*   No results for input(s): LIPASE, AMYLASE in the last 168 hours.  No results for input(s): AMMONIA in the last 168 hours. Coagulation profile No results for input(s): INR, PROTIME in the last 168 hours.   CBC: Recent Labs  Lab 07/06/21 0338 07/07/21 0903 07/08/21 0507 07/09/21 0451 07/10/21 0500  WBC 24.7* 25.1* 18.6* 23.6* 21.1*  HGB 9.3* 10.2* 8.6* 8.5* 7.8*  HCT 30.8* 34.0* 28.7* 27.3* 25.8*  MCV 87.0 89.2 88.6 86.4 89.6  PLT 366 448* 356 380 349   Cardiac Enzymes: No results for input(s): CKTOTAL, CKMB, CKMBINDEX, TROPONINI in the last 168 hours. BNP: Invalid input(s): POCBNP CBG: No results for input(s): GLUCAP in the last 168 hours. D-Dimer No results for input(s): DDIMER in the last 72 hours. Hgb A1c No results for input(s): HGBA1C in the last 72 hours.  Lipid Profile No results for input(s): CHOL, HDL, LDLCALC, TRIG, CHOLHDL, LDLDIRECT in the last 72 hours.  Thyroid function studies No results for input(s): TSH, T4TOTAL, T3FREE, THYROIDAB in the last 72 hours.  Invalid input(s): FREET3 Anemia work up No results for input(s): VITAMINB12, FOLATE, FERRITIN, TIBC, IRON, RETICCTPCT  in the last 72 hours.  Microbiology No results found for this or any previous visit (from the past 240 hour(s)).     Studies:  CT ABDOMEN PELVIS W CONTRAST  Result Date: 07/09/2021 CLINICAL DATA:  Abdominal pain. Postoperative. History of lung cancer. EXAM: CT ABDOMEN AND PELVIS WITH CONTRAST TECHNIQUE: Multidetector CT imaging of the abdomen and pelvis was performed using the standard protocol following bolus administration of intravenous contrast. RADIATION DOSE REDUCTION: This exam was performed according to the departmental dose-optimization program which includes automated exposure control, adjustment of the mA and/or kV according to patient size and/or use of iterative reconstruction technique. CONTRAST:  179mL OMNIPAQUE IOHEXOL 300 MG/ML  SOLN COMPARISON:   Pelvis and right hip radiographs 07/07/2021 CT abdomen and pelvis 06/28/2021 FINDINGS: Lower chest: New mild-to-moderate right and mild left pleural effusions with associated subsegmental atelectasis. Unchanged curvilinear scarring within the medial middle lobe and anteromedial lingula. Coronary artery calcifications are noted. Mild cardiomegaly, unchanged. Hepatobiliary: There are numerous low-density lesions seen throughout the liver. Most of these are too small to further characterize however the largest within the right hepatic lobe measures approximally 14 mm (axial series 2, image 16) and demonstrates density greater than fluid suspicious for a metastasis. The gallbladder is unremarkable. No intrahepatic or extrahepatic biliary ductal dilatation. Pancreas: No significant change in fluid density cyst within the pancreatic tail measuring up to 2.4 x 1.9 cm. No pancreatic ductal dilatation is seen. Spleen: Normal in size without focal abnormality. Adrenals/Urinary Tract: Adrenal glands are unremarkable. The kidneys enhance uniformly and are symmetric in size without hydronephrosis. No renal stone is seen. Multiple subcentimeter low-attenuation lesions too small to further characterize. A left lower pole 12 mm low-attenuation lesion is unchanged from prior and compatible with a cyst but not well evaluated on the current study. No focal urinary bladder wall thickening. Mild nondependent air within the urinary bladder lumen. Recommend clinical correlation for recent instrumentation which would explain this air. Stomach/Bowel: Postsurgical changes are seen of resection of the ascending colon. The more distal colon is diffusely decompressed. There is a right upper quadrant ventral ostomy. There is a new left hemiabdomen small bowel intussusception along an approximate 6 cm length (coronal series 6, image 45, axial series 2 images 42 through 49). Oral contrast does mildly extend past this point. The more proximal small  bowel is mildly distended up to 2.6 cm in the more distal small bowel is decompressed. Vascular/Lymphatic: No abdominal aortic aneurysm. Moderate atherosclerotic calcifications. Normal variant retroaortic left renal vein. No mesenteric, retroperitoneal, or pelvic lymphadenopathy. Reproductive: The uterus is present. Mild uterine heterogeneity bubbly uterine fibroids. No ovarian mass is identified. Other: Small volume ascites.No pneumoperitoneum. New moderate anasarca. Musculoskeletal: High-grade T11 vertebral body height loss unchanged. Innumerable lytic lesions are seen throughout the visualized spine and pelvis. This includes unchanged dominant left ilium lesion measuring up to 2.9 cm in transverse dimension (axial series 2, image 59) and dominant lytic lesions involving the mid and posterior aspect of the L4 vertebral body (sagittal images 71 through 76). IMPRESSION: Compared to 06/28/2021: 1. Interval resection of the ascending colon region of prior intussusception and new right upper quadrant ileostomy. 2. There is a new short-segment intussusception seen within small bowel within the left hemiabdomen with mild upstream bowel dilatation. Oral contrast appears to extend through this region. 3. Numerous low-density lesions are seen throughout the liver suspicious for metastatic disease. 4. New mild-to-moderate right and mild left pleural effusions. 5. Innumerable osseous metastases. Electronically Signed   By:  Yvonne Kendall M.D.   On: 07/09/2021 20:28    Assessment: 76 y.o. 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  Left lower extremity DVT, on eliquis/heparin  11.  Leukocytosis, likely reactive, possible related to her metastatic cancer  Plan:  -Pt is not doing well, overall very deconditioned, not able to walk due to her generalized weakness and uncontrolled pain. -She does like oxycodone, but states it does not last more than 3 to 4 hours.  I changed it to every 4 hours as needed, and gave her extra dose when I saw her in late afternoon today. I spoke with Dr. Rowe Pavy about this today. She declined long acting narcotics today  -I have reached out to IR, Dr. Pascal Lux offered osteocool and kyphoplasty, with the main goal of pain relieve and hopefully she is able to walk  -Her overall prognosis is guarded.  Due to the diffuse metastasis, she is likely developing more symptoms, and recurrent bowel obstruction.  She is not a candidate for chemotherapy, I recommend palliative care and hospice. Pt  complained pain when I saw her and did not want to talk much today  -will reach out to her son to discuss [place] and hospice, she may not be eligible for residential hospice yet. -discharge plain will be challenge, she may not be able to go home due to the support and care she may need. May need to explore the option of rehab if possible  -I will f/u as needed.   Truitt Merle, MD 07/10/2021
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
## 2021-07-10 NOTE — Progress Notes (Signed)
Request to IR for possible Osetocool ablation/kyphoplasty of symptomatic pathologic vertebral fractures. ? ?Patient history and imaging reviewed by Dr. Pascal Lux today who requests MRI thoracic/lumbar spine (he is aware that patient had thoracic spine MRI 05/15/21) to further evaluate T11, L3, L4 and L5 for possible treatment at these levels. MRI has been ordered by IR today. She is approved for treatment at T11 regardless of MRI results.  ? ?This procedure requires insurance authorization for each level to be completed and as such this cannot be processed until the results of the MRI are obtained and reviewed by attending IR physician to determine which levels are amenable to treatment. ? ?Plan: ?- MRI thoracic/lumbar spine w/ and w/o contrast (ordered today). Results will be reviewed by IR MD and decision made regarding treatable levels. ?- Once treatable levels are determined insurance authorization can be processed, this typically takes 24-48 hours ?- Hold Eliquis in anticipation of procedure (transitioned to heparin gtt today) ?- IR APP will see patient for consult/consent once treatable levels are determined. ? ?Robin Norse, PA-C ?

## 2021-07-11 DIAGNOSIS — R531 Weakness: Secondary | ICD-10-CM | POA: Diagnosis not present

## 2021-07-11 DIAGNOSIS — K561 Intussusception: Secondary | ICD-10-CM | POA: Diagnosis not present

## 2021-07-11 DIAGNOSIS — Z515 Encounter for palliative care: Secondary | ICD-10-CM | POA: Diagnosis not present

## 2021-07-11 DIAGNOSIS — Z7189 Other specified counseling: Secondary | ICD-10-CM | POA: Diagnosis not present

## 2021-07-11 LAB — COMPREHENSIVE METABOLIC PANEL
ALT: 13 U/L (ref 0–44)
AST: 25 U/L (ref 15–41)
Albumin: 1.7 g/dL — ABNORMAL LOW (ref 3.5–5.0)
Alkaline Phosphatase: 193 U/L — ABNORMAL HIGH (ref 38–126)
Anion gap: 7 (ref 5–15)
BUN: 10 mg/dL (ref 8–23)
CO2: 23 mmol/L (ref 22–32)
Calcium: 8.2 mg/dL — ABNORMAL LOW (ref 8.9–10.3)
Chloride: 102 mmol/L (ref 98–111)
Creatinine, Ser: 0.39 mg/dL — ABNORMAL LOW (ref 0.44–1.00)
GFR, Estimated: >60 mL/min (ref >60)
Glucose, Bld: 104 mg/dL — ABNORMAL HIGH (ref 70–99)
Potassium: 4.1 mmol/L (ref 3.5–5.1)
Sodium: 132 mmol/L — ABNORMAL LOW (ref 135–145)
Total Bilirubin: 0.3 mg/dL (ref 0.3–1.2)
Total Protein: 4.9 g/dL — ABNORMAL LOW (ref 6.5–8.1)

## 2021-07-11 LAB — CBC
HCT: 26.9 % — ABNORMAL LOW (ref 36.0–46.0)
Hemoglobin: 7.8 g/dL — ABNORMAL LOW (ref 12.0–15.0)
MCH: 26.7 pg (ref 26.0–34.0)
MCHC: 29 g/dL — ABNORMAL LOW (ref 30.0–36.0)
MCV: 92.1 fL (ref 80.0–100.0)
Platelets: 334 10*3/uL (ref 150–400)
RBC: 2.92 MIL/uL — ABNORMAL LOW (ref 3.87–5.11)
RDW: 27.8 % — ABNORMAL HIGH (ref 11.5–15.5)
WBC: 17.3 10*3/uL — ABNORMAL HIGH (ref 4.0–10.5)
nRBC: 0 % (ref 0.0–0.2)

## 2021-07-11 LAB — APTT
aPTT: 86 seconds — ABNORMAL HIGH (ref 24–36)
aPTT: 98 seconds — ABNORMAL HIGH (ref 24–36)

## 2021-07-11 LAB — HEPARIN LEVEL (UNFRACTIONATED): Heparin Unfractionated: >1.1 IU/mL — ABNORMAL HIGH (ref 0.30–0.70)

## 2021-07-11 MED ORDER — PANTOPRAZOLE SODIUM 40 MG PO TBEC
40.0000 mg | DELAYED_RELEASE_TABLET | Freq: Every day | ORAL | Status: DC
  Administered 2021-07-11 – 2021-07-24 (×13): 40 mg via ORAL
  Filled 2021-07-11 (×13): qty 1

## 2021-07-11 NOTE — Progress Notes (Addendum)
Patient ID: Robin Estrada, female   DOB: 1946-05-06, 76 y.o.   MRN: [id number]    Referring Physician(s): Feng,Y  Supervising Physician: Sandi Mariscal  Patient Status:  [facility] - In-pt  Chief Complaint: Back pain, lung/breast cancer, pathologic compression fractures    Subjective: Patient known to IR service from right upper lobe lung mass biopsy on 05/14/2021.  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  Imaging revealed large intussusception of the cecum and distal ileum within the length of the ascending colon and subsequent dilatation of the ascending colon.  There were also numerous lytic lesions throughout the thoracolumbar spine and pelvis consistent with metastatic disease.  She underwent laparoscopic right colectomy with end ileostomy on 2/18.  She had postop cardiac arrest and inferior STEMI ; also with ischemic cardiomyopathy ;she was not a candidate for cardiac intervention per cardiology.  In addition patient also has T11 pathologic fracture along with abnormal ventral epidural enhancement consistent with epidural spread of tumor.  Metastatic lesions  at all vertebral levels, worst at L3 and L4 as well as T5, and T8.  She has mid and lower back pain with some radiation of pain down the right lower extremity.  She is status post palliative radiotherapy.  She does have acute DVT involving left posterior tibial veins.  She is currently on IV heparin. Request now received from oncology  for possible kyphoplasty.  She currently denies fever, headache, chest pain, worsening dyspnea, cough, abdominal pain, nausea, vomiting or bleeding.   Past Medical History:  Diagnosis Date   Breast mass, left    Large   Cancer ([place])    FIBROMYOMA  AGE 57-   BENIGN  RESOLVED   GI bleed  05/03/2021   1/23 continue with pantoprazole   Headache    Heart murmur    Hypertension    Mitral valve prolapse    Tuberculosis    AS CHILD  W/ TX   Past Surgical History:  Procedure Laterality Date   BIOPSY  05/04/2021   Procedure: BIOPSY;  Surgeon: Irving Copas., MD;  Location: [place];  Service: Gastroenterology;;   BRONCHIAL BIOPSY  05/07/2021   Procedure: BRONCHIAL BIOPSIES;  Surgeon: Margaretha Seeds, MD;  Location: [facility] ENDOSCOPY;  Service: Pulmonary;;   BRONCHIAL BRUSHINGS  05/07/2021   Procedure: BRONCHIAL BRUSHINGS;  Surgeon: Margaretha Seeds, MD;  Location: [facility] ENDOSCOPY;  Service: Pulmonary;;   BRONCHIAL NEEDLE ASPIRATION BIOPSY  05/07/2021   Procedure: BRONCHIAL NEEDLE ASPIRATION BIOPSIES;  Surgeon: Margaretha Seeds, MD;  Location: [facility] ENDOSCOPY;  Service: Pulmonary;;   BRONCHIAL WASHINGS  05/07/2021   Procedure: BRONCHIAL WASHINGS;  Surgeon: Margaretha Seeds, MD;  Location: [place];  Service: Pulmonary;;   COLONOSCOPY WITH PROPOFOL N/A 05/09/2021   Procedure: COLONOSCOPY WITH PROPOFOL;  Surgeon: Ladene Artist, MD;  Location: [facility] ENDOSCOPY;  Service: Endoscopy;  Laterality: N/A;   ESOPHAGOGASTRODUODENOSCOPY N/A 05/04/2021   Procedure: ESOPHAGOGASTRODUODENOSCOPY (EGD);  Surgeon: Irving Copas., MD;  Location: [place];  Service: Gastroenterology;  Laterality: N/A;   HEMOSTASIS CONTROL  05/07/2021   Procedure: HEMOSTASIS CONTROL;  Surgeon: Margaretha Seeds, MD;  Location: [place];  Service: Pulmonary;;   LAPAROSCOPIC PARTIAL RIGHT COLECTOMY N/A 06/29/2021   Procedure: LAPAROSCOPIC PARTIAL RIGHT COLECTOMY WITH END ILEOSTOMY;  Surgeon: Leighton Ruff, MD;  Location: [facility];  Service:  General;  Laterality: N/A;   MASS EXCISION Left 08/14/2017   Procedure: PARTIAL EXCISION  LEFT BREAST  MASS ERAS PATHWAY;  Surgeon: Fanny Skates, MD;  Location: [place];  Service: General;  Laterality: Left;   MASTECTOMY W/ SENTINEL NODE BIOPSY Left 09/14/2017    Procedure: LEFT TOTAL MASTECTOMY WITH SENTINEL LYMPH NODE BIOPSY;  Surgeon: Fanny Skates, MD;  Location: [place];  Service: General;  Laterality: Left;   NO PAST SURGERIES     POLYPECTOMY  05/09/2021   Procedure: POLYPECTOMY;  Surgeon: Ladene Artist, MD;  Location: [facility] ENDOSCOPY;  Service: Endoscopy;;   SUBMUCOSAL TATTOO INJECTION  05/09/2021   Procedure: SUBMUCOSAL TATTOO INJECTION;  Surgeon: Ladene Artist, MD;  Location: [place];  Service: Endoscopy;;   VIDEO BRONCHOSCOPY WITH ENDOBRONCHIAL ULTRASOUND N/A 05/07/2021   Procedure: VIDEO BRONCHOSCOPY WITH ENDOBRONCHIAL ULTRASOUND;  Surgeon: Margaretha Seeds, MD;  Location: [place];  Service: Pulmonary;  Laterality: N/A;     Allergies: Statins, Amlodipine, Clonidine derivatives, Losartan, Sulfa antibiotics, and Sulfamethoxazole-trimethoprim  Medications: Prior to Admission medications   Medication Sig Start Date End Date Taking? Authorizing Provider  acetaminophen (TYLENOL) 500 MG tablet Take 1 tablet (500 mg total) by mouth every 8 (eight) hours as needed for headache. 05/26/21  Yes Thurnell Lose, MD  atenolol (TENORMIN) 100 MG tablet Take 1 tablet (100 mg total) by mouth 2 (two) times daily. 06/05/21  Yes Plotnikov, Evie Lacks, MD  cholecalciferol (VITAMIN D) 1000 units tablet Take 1 tablet (1,000 Units total) by mouth daily. 08/01/16  Yes Plotnikov, Evie Lacks, MD  Multiple Vitamin (MULTIVITAMIN WITH MINERALS) TABS tablet Take 1 tablet by mouth daily. 05/27/21  Yes Thurnell Lose, MD  ondansetron (ZOFRAN) 4 MG tablet Take 1 tablet (4 mg total) by mouth every 8 (eight) hours as needed for nausea. 06/10/21  Yes Plotnikov, Evie Lacks, MD  pantoprazole (PROTONIX) 40 MG tablet Take 1 tablet (40 mg total) by mouth 2 (two) times daily before a meal. 05/26/21  Yes Thurnell Lose, MD  senna-docusate (SENOKOT-S) 8.6-50 MG tablet Take 1-2 tablets by mouth daily as needed for mild constipation. 06/10/21 06/10/22 Yes Plotnikov, Evie Lacks, MD  vitamin B-12 1000 MCG tablet Take 1 tablet (1,000 mcg total) by mouth daily. 05/27/21  Yes Thurnell Lose, MD  fentaNYL (DURAGESIC) 12 MCG/HR Place 1 patch onto the skin every 3 (three) days. Patient not taking: Reported on 06/29/2021 06/10/21   Plotnikov, Evie Lacks, MD     Vital Signs: BP (!) 112/57 (BP Location: Right Wrist)    Pulse 92    Temp 97.9 F (36.6 C) (Oral)    Resp 20    Ht 5' (1.524 m)    Wt 134 lb 4.2 oz (60.9 kg)    SpO2 98%    BMI 26.22 kg/m   Physical Exam awake, answers questions appropriately via interpreter.  Affect fairly flat.  Chest clear to auscultation bilaterally.  Heart with regular rate and rhythm.  Abdomen soft, positive bowel sounds, nontender.  Bilateral foot edema noted; patient with mod paravertebral tenderness at mid and lower back regions.  Imaging: MR THORACIC SPINE W WO CONTRAST  Result Date: 07/10/2021 CLINICAL DATA:  Compression fracture EXAM: MRI THORACIC AND LUMBAR SPINE WITHOUT AND WITH CONTRAST TECHNIQUE: Multiplanar and multiecho pulse sequences of the thoracic and lumbar spine were obtained without and with intravenous contrast. CONTRAST:  6.1mL GADAVIST GADOBUTROL 1 MMOL/ML IV SOLN COMPARISON:  05/15/2021 FINDINGS: MRI THORACIC SPINE FINDINGS Alignment:  Physiologic. Vertebrae: There  are metastatic lesions at all vertebral levels. The largest lesions are at T5, T8 and T11. At T11 there is a compression deformity with 50% height loss and retropulsion measuring 4 mm. Height loss has progressed. Cord:  Normal signal and morphology. Paraspinal and other soft tissues: 3.7 cm area of peripheral enhancement at the right lung base. Disc levels: Mild spinal canal stenosis at the T11 level. Spinal canal is otherwise widely patent. Mild contrast enhancement of the ventral epidural space at the T11 level is unchanged. MRI LUMBAR SPINE FINDINGS Segmentation:  Standard. Alignment:  Physiologic. Vertebrae: Metastatic lesions at all levels, worst at L3 and L4. At  L4, there is an area ventral epidural contrast enhancement. No clear compression fracture. Conus medullaris: Extends to the L1 level and appears normal. Paraspinal and other soft tissues: Negative. Disc levels: L1-L2: Normal disc space and facet joints. No spinal canal stenosis. No neural foraminal stenosis. L2-L3: Normal disc space and facet joints. No spinal canal stenosis. No neural foraminal stenosis. L3-L4: Left asymmetric disc bulge. Left lateral recess narrowing without central spinal canal stenosis. No neural foraminal stenosis. L4-L5: Small disc bulge. Abnormal ventral epidural enhancement. No spinal canal stenosis. No neural foraminal stenosis. L5-S1: Normal disc space and facet joints. No spinal canal stenosis. No neural foraminal stenosis. Visualized sacrum: Normal. IMPRESSION: 1. Diffuse osseous metastatic disease throughout the thoracic and lumbar spine. 2. Progressive height loss of T11 pathologic fracture with unchanged thin epidural enhancement, possibly epidural tumor extension. 3. Abnormal ventral epidural enhancement at L4 is consistent with epidural spread of tumor. No associated stenosis. 4. 3.7 cm right lung mass Electronically Signed   By: Ulyses Jarred M.D.   On: 07/10/2021 23:12   MR Lumbar Spine W Wo Contrast  Result Date: 07/10/2021 CLINICAL DATA:  Compression fracture EXAM: MRI THORACIC AND LUMBAR SPINE WITHOUT AND WITH CONTRAST TECHNIQUE: Multiplanar and multiecho pulse sequences of the thoracic and lumbar spine were obtained without and with intravenous contrast. CONTRAST:  6.23mL GADAVIST GADOBUTROL 1 MMOL/ML IV SOLN COMPARISON:  05/15/2021 FINDINGS: MRI THORACIC SPINE FINDINGS Alignment:  Physiologic. Vertebrae: There are metastatic lesions at all vertebral levels. The largest lesions are at T5, T8 and T11. At T11 there is a compression deformity with 50% height loss and retropulsion measuring 4 mm. Height loss has progressed. Cord:  Normal signal and morphology. Paraspinal and  other soft tissues: 3.7 cm area of peripheral enhancement at the right lung base. Disc levels: Mild spinal canal stenosis at the T11 level. Spinal canal is otherwise widely patent. Mild contrast enhancement of the ventral epidural space at the T11 level is unchanged. MRI LUMBAR SPINE FINDINGS Segmentation:  Standard. Alignment:  Physiologic. Vertebrae: Metastatic lesions at all levels, worst at L3 and L4. At L4, there is an area ventral epidural contrast enhancement. No clear compression fracture. Conus medullaris: Extends to the L1 level and appears normal. Paraspinal and other soft tissues: Negative. Disc levels: L1-L2: Normal disc space and facet joints. No spinal canal stenosis. No neural foraminal stenosis. L2-L3: Normal disc space and facet joints. No spinal canal stenosis. No neural foraminal stenosis. L3-L4: Left asymmetric disc bulge. Left lateral recess narrowing without central spinal canal stenosis. No neural foraminal stenosis. L4-L5: Small disc bulge. Abnormal ventral epidural enhancement. No spinal canal stenosis. No neural foraminal stenosis. L5-S1: Normal disc space and facet joints. No spinal canal stenosis. No neural foraminal stenosis. Visualized sacrum: Normal. IMPRESSION: 1. Diffuse osseous metastatic disease throughout the thoracic and lumbar spine. 2. Progressive height loss of T11  pathologic fracture with unchanged thin epidural enhancement, possibly epidural tumor extension. 3. Abnormal ventral epidural enhancement at L4 is consistent with epidural spread of tumor. No associated stenosis. 4. 3.7 cm right lung mass Electronically Signed   By: Ulyses Jarred M.D.   On: 07/10/2021 23:12   CT ABDOMEN PELVIS W CONTRAST  Result Date: 07/09/2021 CLINICAL DATA:  Abdominal pain. Postoperative. History of lung cancer. EXAM: CT ABDOMEN AND PELVIS WITH CONTRAST TECHNIQUE: Multidetector CT imaging of the abdomen and pelvis was performed using the standard protocol following bolus administration of  intravenous contrast. RADIATION DOSE REDUCTION: This exam was performed according to the departmental dose-optimization program which includes automated exposure control, adjustment of the mA and/or kV according to patient size and/or use of iterative reconstruction technique. CONTRAST:  189mL OMNIPAQUE IOHEXOL 300 MG/ML  SOLN COMPARISON:  Pelvis and right hip radiographs 07/07/2021 CT abdomen and pelvis 06/28/2021 FINDINGS: Lower chest: New mild-to-moderate right and mild left pleural effusions with associated subsegmental atelectasis. Unchanged curvilinear scarring within the medial middle lobe and anteromedial lingula. Coronary artery calcifications are noted. Mild cardiomegaly, unchanged. Hepatobiliary: There are numerous low-density lesions seen throughout the liver. Most of these are too small to further characterize however the largest within the right hepatic lobe measures approximally 14 mm (axial series 2, image 16) and demonstrates density greater than fluid suspicious for a metastasis. The gallbladder is unremarkable. No intrahepatic or extrahepatic biliary ductal dilatation. Pancreas: No significant change in fluid density cyst within the pancreatic tail measuring up to 2.4 x 1.9 cm. No pancreatic ductal dilatation is seen. Spleen: Normal in size without focal abnormality. Adrenals/Urinary Tract: Adrenal glands are unremarkable. The kidneys enhance uniformly and are symmetric in size without hydronephrosis. No renal stone is seen. Multiple subcentimeter low-attenuation lesions too small to further characterize. A left lower pole 12 mm low-attenuation lesion is unchanged from prior and compatible with a cyst but not well evaluated on the current study. No focal urinary bladder wall thickening. Mild nondependent air within the urinary bladder lumen. Recommend clinical correlation for recent instrumentation which would explain this air. Stomach/Bowel: Postsurgical changes are seen of resection of the  ascending colon. The more distal colon is diffusely decompressed. There is a right upper quadrant ventral ostomy. There is a new left hemiabdomen small bowel intussusception along an approximate 6 cm length (coronal series 6, image 45, axial series 2 images 42 through 49). Oral contrast does mildly extend past this point. The more proximal small bowel is mildly distended up to 2.6 cm in the more distal small bowel is decompressed. Vascular/Lymphatic: No abdominal aortic aneurysm. Moderate atherosclerotic calcifications. Normal variant retroaortic left renal vein. No mesenteric, retroperitoneal, or pelvic lymphadenopathy. Reproductive: The uterus is present. Mild uterine heterogeneity bubbly uterine fibroids. No ovarian mass is identified. Other: Small volume ascites.No pneumoperitoneum. New moderate anasarca. Musculoskeletal: High-grade T11 vertebral body height loss unchanged. Innumerable lytic lesions are seen throughout the visualized spine and pelvis. This includes unchanged dominant left ilium lesion measuring up to 2.9 cm in transverse dimension (axial series 2, image 59) and dominant lytic lesions involving the mid and posterior aspect of the L4 vertebral body (sagittal images 71 through 76). IMPRESSION: Compared to 06/28/2021: 1. Interval resection of the ascending colon region of prior intussusception and new right upper quadrant ileostomy. 2. There is a new short-segment intussusception seen within small bowel within the left hemiabdomen with mild upstream bowel dilatation. Oral contrast appears to extend through this region. 3. Numerous low-density lesions are seen throughout the  liver suspicious for metastatic disease. 4. New mild-to-moderate right and mild left pleural effusions. 5. Innumerable osseous metastases. Electronically Signed   By: Yvonne Kendall M.D.   On: 07/09/2021 20:28    Labs:  CBC: Recent Labs    07/08/21 0507 07/09/21 0451 07/10/21 0500 07/11/21 0817  WBC 18.6* 23.6* 21.1*  17.3*  HGB 8.6* 8.5* 7.8* 7.8*  HCT 28.7* 27.3* 25.8* 26.9*  PLT 356 380 349 334    COAGS: Recent Labs    06/30/21 1443 07/10/21 0924 07/10/21 1928 07/10/21 2336 07/11/21 0817  INR 1.3*  --   --   --   --   APTT 49* 46* 189* 98* 86*    BMP: Recent Labs    07/08/21 0507 07/09/21 0451 07/10/21 0500 07/11/21 0817  NA 134* 134* 131* 132*  K 4.0 4.0 3.9 4.1  CL 107 105 102 102  CO2 21* 23 23 23   GLUCOSE 125* 134* 134* 104*  BUN 13 12 11 10   CALCIUM 8.0* 8.1* 7.9* 8.2*  CREATININE 0.44 0.42* 0.49 0.39*  GFRNONAA >60 >60 >60 >60    LIVER FUNCTION TESTS: Recent Labs    07/08/21 0507 07/09/21 0451 07/10/21 0500 07/11/21 0817  BILITOT 0.2* 0.3 0.2* 0.3  AST 19 22 22 25   ALT 10 11 10 13   ALKPHOS 188* 174* 182* 193*  PROT 4.3* 4.6* 4.5* 4.9*  ALBUMIN 1.6* 1.6* 1.6* 1.7*    Assessment and Plan: Patient known to IR service from right upper lobe lung mass biopsy on 05/14/2021.  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  Imaging revealed large intussusception of the cecum and distal ileum within the length of the ascending colon and subsequent dilatation of the ascending colon.  There were also numerous lytic lesions throughout the thoracolumbar spine and pelvis consistent with metastatic disease.  She underwent laparoscopic right colectomy with end ileostomy on 2/18.  She had postop cardiac arrest and inferior STEMI ; also with ischemic cardiomyopathy ;she was not a candidate for cardiac intervention per cardiology.  In addition patient also has T11 pathologic fracture along with abnormal ventral epidural enhancement consistent with epidural spread of tumor.  Metastatic lesions  at all vertebral levels, worst at L3 and L4 as well as T5, and T8.  She has mid and lower  back pain with some radiation of pain down the right lower extremity.  She is status post palliative radiotherapy.  She does have acute DVT involving left posterior tibial veins.  She is currently on IV heparin.  She is afebrile, WBC 17.3-likely reactive leukocytosis, hemoglobin 7.8, platelets normal, creatinine 0.39. Request now received from oncology for possible kyphoplasty.  Latest imaging studies have been reviewed by Dr. Pascal Lux.  Patient appears to be candidate for osteocool/KP at T8, T11, L3 and L4 levels.  Details/risks of above procedure, including but not limited to, internal bleeding, infection, nerve injury, inability to eradicate back pain discussed with patient via interpreter with her apparent understanding.  We will also plan to speak with patient's son regarding procedure.  We will need to wait for insurance authorization before scheduling patient.  We will continue to monitor and provide updates as we receive them.  Earliest procedure can be done will be next week.   Electronically Signed: D. Rowe Robert, PA-C 07/11/2021, 4:35 PM   I  spent a total of 30 minutes at the the patient's bedside AND on the patient's hospital floor or unit, greater than 50% of which was counseling/coordinating care for possible T8/T11/L3 and L4 osteocool ablation/kyphoplasty

## 2021-07-11 NOTE — Progress Notes (Addendum)
ANTICOAGULATION CONSULT NOTE   ? ?Pharmacy Consult for heparin  bridge therapy while apixaban on hold ?Indication: DVT ? ?Allergies  ?Allergen Reactions  ? Statins Nausea And Vomiting and Other (See Comments)  ?  MUSCLE PAIN  ? Amlodipine   ?  fatigue  ? Clonidine Derivatives   ?  headache  ? Losartan   ?  abd pain  ? Sulfa Antibiotics Nausea Only  ? Sulfamethoxazole-Trimethoprim Nausea Only  ?  " Severe Nausea "  ? ? ?Patient Measurements: ?Height: 5" (152.4 cm) ?Height: 5' (152.4 cm) ?Weight: 60.9 kg (134 lb 4.2 oz) ?IBW/kg (Calculated) : 45.5 ? ? ?Vital Signs: ?Temp: 97.5 ?F (36.4 ?C) (03/02 6433) ?Temp Source: Oral (03/02 2951) ?BP: 115/58 (03/02 8841) ?Pulse Rate: 96 (03/02 0508) ? ?Labs: ?Recent Labs  ?  07/09/21 ?0451 07/10/21 ?0500 07/10/21 ?6606 07/10/21 ?3016 07/10/21 ?1928 07/10/21 ?2336 07/11/21 ?0109  ?HGB 8.5* 7.8*  --   --   --   --  7.8*  ?HCT 27.3* 25.8*  --   --   --   --  26.9*  ?PLT 380 349  --   --   --   --  334  ?APTT  --   --  46*   < > 189* 98* 86*  ?HEPARINUNFRC  --   --  >1.10*  --   --   --  >1.10*  ?CREATININE 0.42* 0.49  --   --   --   --  0.39*  ? < > = values in this interval not displayed.  ? ? ? ?Estimated Creatinine Clearance: 48.8 mL/min (A) (by C-G formula based on SCr of 0.39 mg/dL (L)). ? ? ?Medical History: ?Past Medical History:  ?Diagnosis Date  ? Breast mass, left   ? Large  ? [facility])   ? FIBROMYOMA  AGE 36-   BENIGN  RESOLVED  ? GI bleed 05/03/2021  ? 1/23 continue with pantoprazole  ? Headache   ? Heart murmur   ? Hypertension   ? Mitral valve prolapse   ? Tuberculosis   ? AS CHILD  W/ TX  ? ? ?Medications:  ?No anticoagulants PTA  ? ?Assessment: ?75 y/o female with PMH of metastatic breast cancer who is post-op lap R colectomy, end ileostomy.  Experienced STEMI and cardiac arrest in PACU on 2/18. Started on Aspirin 81mg  daily and received 48 hours of IV heparin 2/19-2/21, then transitioned to Enoxaparin 40mg  SQ q24h for VTE prophylaxis (last dose 2/24 at 1132).  Bilateral lower extremity venous duplex today + for LLE DVT. Pharmacy consulted for Apixaban dosing on 07/04/21. ? ?Today, 3/1, pharmacy is consulted to dose heparin for bridge therapy while apixaban is on hold x 48 hours for IR procedure  ?Last dose apixaban 10 mg 2/28 @ 2208 ?Hg trending down @ 7.8, PLT WNL @ 349, SCr WNL ? ?07/11/21 ? aPTT = 86 sec (therapeutic) with heparin gtt @ 950 units/hr ?Heparin level > 1.1 due to apixaban use ?Hg remains stable @ 7.8, PLT WNL ?No complications of therapy noted ? ?Goal of Therapy: ?aPTT 66-102 sec ?Heparin level 0.3-0.7 ? ?Plan:  ?Continue IV heparin @ 950 units/hr ?Will titrate using aPTTs as heparin level elevated due to apixaban use ?Daily aPTT/HL/CBC while on heparin ?F/u timing of kyphoplasty & when IR wants to hold heparin drip  ?IV PPI> PO per protocol ? ?Eudelia Bunch, Pharm.D ?07/11/2021 9:25 AM ? ?

## 2021-07-11 NOTE — Progress Notes (Signed)
ANTICOAGULATION CONSULT NOTE   ? ?Pharmacy Consult for heparin  bridge therapy while apixaban on hold ?Indication: DVT ? ?Allergies  ?Allergen Reactions  ? Statins Nausea And Vomiting and Other (See Comments)  ?  MUSCLE PAIN  ? Amlodipine   ?  fatigue  ? Clonidine Derivatives   ?  headache  ? Losartan   ?  abd pain  ? Sulfa Antibiotics Nausea Only  ? Sulfamethoxazole-Trimethoprim Nausea Only  ?  " Severe Nausea "  ? ? ?Patient Measurements: ?Height: 5" (152.4 cm) ?Height: 5' (152.4 cm) ?Weight: 60.9 kg (134 lb 4.2 oz) ?IBW/kg (Calculated) : 45.5 ? ? ?Vital Signs: ?Temp: 97.7 ?F (36.5 ?C) (03/01 2237) ?Temp Source: Oral (03/01 1507) ?BP: 106/60 (03/01 2237) ?Pulse Rate: 93 (03/01 2237) ? ?Labs: ?Recent Labs  ?  07/08/21 ?0507 07/09/21 ?0451 07/10/21 ?0500 07/10/21 ?8887 07/10/21 ?1928 07/10/21 ?2336  ?HGB 8.6* 8.5* 7.8*  --   --   --   ?HCT 28.7* 27.3* 25.8*  --   --   --   ?PLT 356 380 349  --   --   --   ?APTT  --   --   --  46* 189* 98*  ?HEPARINUNFRC  --   --   --  >1.10*  --   --   ?CREATININE 0.44 0.42* 0.49  --   --   --   ? ? ? ?Estimated Creatinine Clearance: 48.8 mL/min (by C-G formula based on SCr of 0.49 mg/dL). ? ? ?Medical History: ?Past Medical History:  ?Diagnosis Date  ? Breast mass, left   ? Large  ? Cancer [facility])   ? FIBROMYOMA  AGE 31-   BENIGN  RESOLVED  ? GI bleed 05/03/2021  ? 1/23 continue with pantoprazole  ? Headache   ? Heart murmur   ? Hypertension   ? Mitral valve prolapse   ? Tuberculosis   ? AS CHILD  W/ TX  ? ? ?Medications:  ?No anticoagulants PTA  ? ?Assessment: ?76 y/o female with PMH of metastatic breast cancer who is post-op lap R colectomy, end ileostomy.  Experienced STEMI and cardiac arrest in PACU on 2/18. Started on Aspirin 81mg  daily and received 48 hours of IV heparin 2/19-2/21, then transitioned to Enoxaparin 40mg  SQ q24h for VTE prophylaxis (last dose 2/24 at 1132). Bilateral lower extremity venous duplex today + for LLE DVT. Pharmacy consulted for Apixaban dosing on  07/04/21. ? ?Today, 3/1, pharmacy is consulted to dose heparin for bridge therapy while apixaban is on hold x 48 hours for IR procedure  ?Last dose apixaban 10 mg 2/28 @ 2208 ?Hg trending down @ 7.8, PLT WNL @ 349, SCr WNL ? ?07/11/21 ?Redrawn aPTT = 98 sec (therapeutic) with heparin gtt @ 950 units/hr ?No complications of therapy noted ? ?Goal of Therapy: ?aPTT 66-102 sec ?Heparin level 0.3-0.7 ? ?Plan:  ?Continue IV heparin @ 950 units/hr ?Repeat aPTT in 8 hr to confirm therapeutic dose ?Will titrate using aPTTs as heparin level will be elevated due to apixaban use ?Daily aPTT/HL/CBC while on heparin ? ?Leone Haven, PharmD ?07/11/2021 12:08 AM ? ?

## 2021-07-11 NOTE — Progress Notes (Signed)
OT Cancellation Note ? ?Patient Details ?Name: Robin Estrada ?MRN: [id number] ?DOB: 03/03/1946 ? ? ?Cancelled Treatment:    Reason Eval/Treat Not Completed: Pain limiting ability to participate: Pt reports 7/10 pain to RLE and ?Declines therapy. RN to give pain meds but reports will need 45-60 min to take affect. OT will continue efforts.  ?Interpreter [phone] used with pt today.  ?Julien Girt ?07/11/2021, 1:57 PM ?

## 2021-07-11 NOTE — Progress Notes (Signed)
?Progress Note ? ? ?Patient: Robin Estrada EHM:[id number] DOB: 04-Apr-1946 DOA: 06/28/2021     13 ?DOS: the patient was seen and examined on 07/11/2021 ?  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  ?CT abdomen with IV contrast showed large intussusception of the cecum and distal ileum within the length of the ascending colon and subsequent dilatation of the ascending colon.  ?There are also noted various and numerous lytic leisons throughout the thoracolumbar spine and pelvis consistent with metastatic disease.  ?General surgery was consulted.  She underwent laparoscopic right colectomy with end ileostomy on 06/29/2021. ?She had a post-op cardiac arrest and inferior STEMI-- Cardiology consulted, Not a candidate for cardiac intervention.  ? ?Assessment and Plan: ?Large Cecal and distal ileum intussusception with bowel obstruction: ?-Patient presented with severe abdominal pain associated with nausea. ?-S/p laparoscopic right colectomy with end ileostomy 2/18 ?-Completed course of zosyn ?-Appreciate general surgery , ileostomy working. Per CCS, leukocytosis likely not from intra-abdominal source--- CT repeat 3/1 shows small area of intussusception but she does have metastatic disease ?-With white count coming down I am hopeful that she does not require any further procedures with ? ?Inferior STEMI / Postop cardiac arrest ?Ischemic cardiomyopathy; ?-Troponin peaked to 24,000.  Cardiology following ?-TTE showed LV ejection fraction of 50%, the left ventricle demonstrated RWMA.  Mild LVH,  diastolic parameters indeterminate. ?--On heparin gtt.-when all procedures performed continue Eliquis, no aspirin ?-Pt refusing statin, zetia, imdur ?-Cardiology feels that she is a poor cath candidate and have signed off ?  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   ?-Patient underwent CT-guided lung biopsy on 05/14/2021- results consistent of poorly differentiated malignancy with sarcomatoid and epithelioid features.   ?-scheduled in am 3/2 for osteocool and kyphoplasty at least at T11, but also potentially at L3, L4 and L5 depending on insurance authorizations for this procedure-I appreciate IR coordinating with patient and family and explaining the procedure to them ?-Patient is not accepting of hospice philosophy despite detailed discussions by her oncologist ?-It appears her pain is under  better control on oral oxy and I have explained the need for day-to-day adjustments and care with adjustments of opiates as these are medications with high index of risk if used cavalierly ?-Had a detailed discussion with the patient's son at the bedside on 3/2 explaining all of these issues ?  ?Hypokalemia:  ?-Replaced ?  ?Hypomagnesemia:  ?-Periodic rechecks ?  ?History of GI bleed: ?-Continue pantoprazole ?  ?Essential hypertension: ?-Noted to have refused imdur ?-Continue Coreg 6.25 twice daily. ?  ?Hyponatremia ?-Normalized ?  ?Acute blood loss anemia ?-Likely postoperative and dilutional component currently hemoglobin 7.8-monitor ?  ?Aortic atherosclerosis: Noted on recent CT ?  ?Cystic mass of pancreas: ?-Previous CA 19-9 level normal. ?-Follow-up outpatient. ? ?Acute LLE DVT ?-Noted and reviewed on LE dopplers ?-Continues on heparin until all procedures are done ? ?Leukocytosis ?-Without abd pain, dysuria, cough or fevers ?-suspecting reactive leukocytosis  - ?-Foley removed ?  ?Poor overall prognosis, palliative care consulted   ?Further planning as per IR and oncology ?Pain control will be contingent on palliative  care's input-I have  explained this to the son and explained my role in the patient's care and he is understanding of the same ?  ? ? ? ?Subjective: She seems better overall her pain is controlled-she seems to wax in and out of sleep however and I addressed this with her son ?I think she may need something for breakthrough pain but it may be preferable not to use opiates ?She is hopeful for the procedure on her back and asks about details of it including whether she will be having this under general anesthesia ?I told her I would try and relay this message to radiology ?She has no other complaints and is eating some ?There is no bleeding ? ?Physical Exam: ?Vitals:  ? 07/10/21 1507 07/10/21 2237 07/11/21 0508 07/11/21 1430  ?BP: (!) 94/52 106/60 (!) 115/58 (!) 112/57  ?Pulse: 96 93 96 92  ?Resp: 16 17 20 20   ?Temp: 98 ?F (36.7 ?C) 97.7 ?F (36.5 ?C) (!) 97.5 ?F (36.4 ?C) 97.9 ?F (36.6 ?C)  ?TempSrc: Oral  Oral Oral  ?SpO2: 95% 93% 97% 98%  ?Weight:      ?Height:      ? ?Awake coherent no distress ?Sleepy ?ROM intact moving 4 limbs reasonably ?No significant edema ?Abdomen soft with ostomy in place with liquid stool ?Slight distention ?Neuro intact  ? ?Data Reviewed: ? ?Labs reviewed, Na 132 white count now 17 down from 21 hemoglobin 7.8 ? ?Family Communication: Pt in room, discussed with patient's son Robin Estrada at the bedside ? ?Disposition: ?Status is: Inpatient ?Remains inpatient appropriate because: Severity of illness ? ? Planned Discharge Destination:  SNF will be necessary for this patient to achieve maximal recovery etc. ? ? ? ? ?Author: ?Nita Sells, MD ?07/11/2021 5:10 PM ? ?For on call review [email or web address].  ? ?

## 2021-07-11 NOTE — TOC Progression Note (Signed)
Transition of Care (TOC) - Progression Note  ? ? ?Patient Details  ?Name: Robin Estrada ?MRN: [id number] ?Date of Birth: October 16, 1945 ? ?Transition of Care (TOC) CM/SW Contact  ?Trish Mage, LCSW ?Phone Number: ?07/11/2021, 2:20 PM ? ?Clinical Narrative:   Met with son, went over bed offers.  He is reluctant to send her to either of the two facilities based on ratings.  He wondered about other options and we talked about HH with him hiring a caregiver.  He also requested a list of all local facilities, which I gave him, and went over the fact that many of them are out of network with her insurance.  We will talk again tomorrow about where he stands on dispositional plan. TOC will continue to follow during the course of hospitalization. ? ? ? ? ?Expected Discharge Plan: [place] ?Barriers to Discharge: Continued Medical Work up ? ?Expected Discharge Plan and Services ?Expected Discharge Plan: [place] ?  ?Discharge Planning Services: CM Consult ?Post Acute Care Choice: NA ?Living arrangements for the past 2 months: [place] ?                ?  ?  ?  ?  ?  ?  ?  ?  ?  ?  ? ? ?Social Determinants of Health (SDOH) Interventions ?  ? ?Readmission Risk Interventions ?Readmission Risk Prevention Plan 07/03/2021  ?Transportation Screening Complete  ?PCP or Specialist Appt within 3-5 Days Complete  ?[place] or Home Care Consult Complete  ?Social Work Consult for [place] Planning/Counseling Complete  ?Palliative Care Screening Complete  ?Medication Review Press photographer) Complete  ?Some recent data might be hidden  ? ? ?

## 2021-07-11 NOTE — Plan of Care (Signed)
?  Problem: Activity: ?Goal: Risk for activity intolerance will decrease ?Outcome: Progressing ?  ?Problem: Nutrition: ?Goal: Adequate nutrition will be maintained ?Outcome: Progressing ?  ?Problem: Coping: ?Goal: Level of anxiety will decrease ?Outcome: Progressing ?  ?Problem: Pain Managment: ?Goal: General experience of comfort will improve ?Outcome: Progressing ?  ?Problem: Education: ?Goal: Knowledge of General Education information will improve ?Description: Including pain rating scale, medication(s)/side effects and non-pharmacologic comfort measures ?Outcome: Progressing ?  ?Problem: Health Behavior/Discharge Planning: ?Goal: Ability to manage health-related needs will improve ?Outcome: Progressing ?  ?Problem: Clinical Measurements: ?Goal: Ability to maintain clinical measurements within normal limits will improve ?Outcome: Progressing ?Goal: Will remain free from infection ?Outcome: Progressing ?Goal: Diagnostic test results will improve ?Outcome: Progressing ?Goal: Respiratory complications will improve ?Outcome: Progressing ?Goal: Cardiovascular complication will be avoided ?Outcome: Progressing ?  ?Problem: Activity: ?Goal: Risk for activity intolerance will decrease ?Outcome: Progressing ?  ?Problem: Nutrition: ?Goal: Adequate nutrition will be maintained ?Outcome: Progressing ?  ?Problem: Coping: ?Goal: Level of anxiety will decrease ?Outcome: Progressing ?  ?Problem: Elimination: ?Goal: Will not experience complications related to bowel motility ?Outcome: Progressing ?Goal: Will not experience complications related to urinary retention ?Outcome: Progressing ?  ?Problem: Pain Managment: ?Goal: General experience of comfort will improve ?Outcome: Progressing ?  ?Problem: Safety: ?Goal: Ability to remain free from injury will improve ?Outcome: Progressing ?  ?Problem: Skin Integrity: ?Goal: Risk for impaired skin integrity will decrease ?Outcome: Progressing ?  ?
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
## 2021-07-12 ENCOUNTER — Encounter: Payer: Self-pay | Admitting: Hematology

## 2021-07-12 DIAGNOSIS — Z515 Encounter for palliative care: Secondary | ICD-10-CM | POA: Diagnosis not present

## 2021-07-12 DIAGNOSIS — R531 Weakness: Secondary | ICD-10-CM | POA: Diagnosis not present

## 2021-07-12 DIAGNOSIS — K561 Intussusception: Secondary | ICD-10-CM | POA: Diagnosis not present

## 2021-07-12 DIAGNOSIS — Z7189 Other specified counseling: Secondary | ICD-10-CM | POA: Diagnosis not present

## 2021-07-12 LAB — APTT
aPTT: 102 seconds — ABNORMAL HIGH (ref 24–36)
aPTT: 82 seconds — ABNORMAL HIGH (ref 24–36)

## 2021-07-12 LAB — CBC
HCT: 23.9 % — ABNORMAL LOW (ref 36.0–46.0)
Hemoglobin: 7.1 g/dL — ABNORMAL LOW (ref 12.0–15.0)
MCH: 27 pg (ref 26.0–34.0)
MCHC: 29.7 g/dL — ABNORMAL LOW (ref 30.0–36.0)
MCV: 90.9 fL (ref 80.0–100.0)
Platelets: 317 10*3/uL (ref 150–400)
RBC: 2.63 MIL/uL — ABNORMAL LOW (ref 3.87–5.11)
RDW: 27.2 % — ABNORMAL HIGH (ref 11.5–15.5)
WBC: 18.7 10*3/uL — ABNORMAL HIGH (ref 4.0–10.5)
nRBC: 0 % (ref 0.0–0.2)

## 2021-07-12 LAB — HEPARIN LEVEL (UNFRACTIONATED)
Heparin Unfractionated: 0.61 IU/mL (ref 0.30–0.70)
Heparin Unfractionated: 0.52 IU/mL (ref 0.30–0.70)

## 2021-07-12 LAB — PREPARE RBC (CROSSMATCH)

## 2021-07-12 LAB — PROTIME-INR
INR: 1.2 (ref 0.8–1.2)
Prothrombin Time: 14.7 seconds (ref 11.4–15.2)

## 2021-07-12 MED ORDER — DIPHENHYDRAMINE HCL 25 MG PO CAPS
25.0000 mg | ORAL_CAPSULE | Freq: Once | ORAL | Status: AC
  Administered 2021-07-12: 25 mg via ORAL
  Filled 2021-07-12: qty 1

## 2021-07-12 MED ORDER — FUROSEMIDE 10 MG/ML IJ SOLN
20.0000 mg | Freq: Once | INTRAMUSCULAR | Status: AC
  Administered 2021-07-12: 20 mg via INTRAVENOUS
  Filled 2021-07-12: qty 2

## 2021-07-12 MED ORDER — SODIUM CHLORIDE 0.9% IV SOLUTION: Freq: Once | INTRAVENOUS | Status: AC

## 2021-07-12 NOTE — Progress Notes (Signed)
?Progress Note ? ? ?PatientHonestee Estrada HER:[id number] DOB: 1946-04-05 DOA: 06/28/2021     14 ?DOS: the patient was seen and examined on 07/12/2021 ?  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  ?CT abdomen with IV contrast showed large intussusception of the cecum and distal ileum within the length of the ascending colon and subsequent dilatation of the ascending colon.  ?There are also noted various and numerous lytic leisons throughout the thoracolumbar spine and pelvis consistent with metastatic disease.  ?General surgery was consulted.  She underwent laparoscopic right colectomy with end ileostomy on 06/29/2021. ?She had a post-op cardiac arrest and inferior STEMI-- Cardiology consulted, Not a candidate for cardiac intervention.  ? ?Assessment and Plan: ?Large Cecal and distal ileum intussusception with bowel obstruction: ?-Patient presented with severe abdominal pain associated with nausea. ?-S/p laparoscopic right colectomy with end ileostomy 2/18 ?-Completed course of zosyn ?-Appreciate general surgery , ileostomy working. Per CCS, leukocytosis likely not from intra-abdominal source--- CT repeat 3/1 shows small area of intussusception but she does have metastatic disease ?-Continue to monitor white count-poor surgical recurrent candidate ? ?Acute blood loss anemia ?-Likely postoperative and dilutional component--no reports of dark stool ?-Transfuse 1 unit blood 3/3 ?-We will give IV iron based on recheck of labs in the a.m.-last checks that showed saturations were good on 2/17 ? ?Inferior STEMI / Postop cardiac arrest ?Ischemic cardiomyopathy; ?-Troponin peaked to 24,000.   ?-TTE showed LV ejection fraction of 50%, the left ventricle demonstrated RWMA.  Mild LVH,  diastolic parameters indeterminate. ?--On heparin  gtt.-when all procedures performed continue Eliquis, no aspirin ?-Pt refusing statin, zetia, imdur ?-Cardiology feels that she is a poor cath candidate and have signed off ?  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   ?-Patient underwent CT-guided lung biopsy on 05/14/2021- results consistent of poorly differentiated malignancy with sarcomatoid and epithelioid features.   ?-Await insurance status to proceed with osteocool and kyphoplasty at least at T11, but also potentially at L3, L4 and L5  ?-I appreciate IR coordinating with patient and family and explaining the procedure to them ?-Patient is not accepting of hospice philosophy despite detailed discussions by her oncologist ?-It appears her pain is under better control on oral oxy  ?-Had a detailed discussion with the patient's son at the bedside on 3/2 explaining all of these issues ?  ?Hypokalemia:  ?-Replaced ?  ?Hypomagnesemia:  ?-Periodic rechecks ?  ?History of GI bleed: ?-Continue pantoprazole ?  ?Essential hypertension: ?-Noted to have refused imdur ?-Continue Coreg 6.25 twice daily. ?  ?Hyponatremia ?-Normalizing ?  ?Aortic atherosclerosis: Noted on recent CT ?  ?Cystic mass of pancreas: ?-Previous CA 19-9 level normal. ?-Follow-up outpatient. ? ?Acute LLE DVT ?-Noted and reviewed on LE dopplers ?-Continues on heparin until all procedures are done ? ?  ?Poor overall prognosis, palliative care consulted   ?Further planning as per IR and oncology ?Pain control will be contingent on palliative care's input- ?Await procedure and then likely can discharge possibly to either skilled facility and or get hired help according to Education officer, museum input ? ?Therapy is recommending home health PT rolling walker with  2 wheels and bedside  commode ? ? ? ?Subjective:  ?She seems better overall her pain is controlled ?No cp ?Sleepy ? ? ?Physical Exam: ?Vitals:  ? 07/12/21 1134 07/12/21 1214 07/12/21 1321 07/12/21 1440  ?BP: 134/63 128/67 (!) 108/57 (!) 126/58  ?Pulse: 91 (!) 103 (!) 101 95  ?Resp: 17 15 16 15   ?Temp: 97.9 ?F (36.6 ?C) 98 ?F (36.7 ?C) 98.3 ?F (36.8 ?C) 97.8 ?F (36.6 ?C)  ?TempSrc: Oral Oral Oral Oral  ?SpO2: 90% 92% 92% 93%  ?Weight:      ?Height:      ? ? ?Sleepy ?ROM intact moving 4 limbs reasonably ?No significant edema ?Abdomen soft with ostomy in place with liquid stool ?Slight distention ?Neuro intact  ? ?Data Reviewed: ? ?Labs reviewed, Na 132 white count now 18 down from 21 hemoglobin 7.1 ? ?Family Communication: Pt in room no family present ? ?Disposition: ?Status is: Inpatient ?Remains inpatient appropriate because: Severity of illness ? ? Planned Discharge Destination:  SNF . ? ? ?Author: ?Nita Sells, MD ?07/12/2021 3:01 PM ? ?For on call review [email or web address]  ? ?

## 2021-07-12 NOTE — Progress Notes (Signed)
Blood product transfusion consent completed and placed in pt chart. Video interpreter utilized during encounter with pt. Pt son called, via telephone, for update from nurse per pt request.  ?

## 2021-07-12 NOTE — Progress Notes (Signed)
Patient ID: Robin Estrada, female   DOB: 1946/01/12, 76 y.o.   MRN: [id number] [facility] Surgery Progress Note  13 Days Post-Op  Subjective: CC-  Comfortable this morning. Pain seems well controlled. Denies any abdominal pain, n/v. Ostomy productive. Doesn't typically eat much for breakfast, but she ate all of her dinner last night including tilapia.   Objective: Vital signs in last 24 hours: Temp:  [97.9 F (36.6 C)] 97.9 F (36.6 C) (03/02 1430) Pulse Rate:  [92-108] 108 (03/03 0812) Resp:  [20] 20 (03/02 1430) BP: (112-126)/(57-71) 126/71 (03/03 0812) SpO2:  [98 %] 98 % (03/02 1430) Last BM Date : 07/12/21  Intake/Output from previous day: 03/02 0701 - 03/03 0700 In: -  Out: 300 [Urine:200; Stool:100] Intake/Output this shift: Total I/O In: -  Out: 300 [Stool:300]  PE: Gen:  Alert, NAD Pulm: rate and effort normal Abd: Soft, NT/ND, ostomy pink and budded with air and small amount soft brown stool in pouch  Lab Results:  Recent Labs    07/11/21 0817 07/12/21 0543  WBC 17.3* 18.7*  HGB 7.8* 7.1*  HCT 26.9* 23.9*  PLT 334 317   BMET Recent Labs    07/10/21 0500 07/11/21 0817  NA 131* 132*  K 3.9 4.1  CL 102 102  CO2 23 23  GLUCOSE 134* 104*  BUN 11 10  CREATININE 0.49 0.39*  CALCIUM 7.9* 8.2*   PT/INR Recent Labs    07/12/21 0543  LABPROT 14.7  INR 1.2   CMP     Component Value Date/Time   NA 132 (L) 07/11/2021 0817   K 4.1 07/11/2021 0817   CL 102 07/11/2021 0817   CO2 23 07/11/2021 0817   GLUCOSE 104 (H) 07/11/2021 0817   BUN 10 07/11/2021 0817   CREATININE 0.39 (L) 07/11/2021 0817   CREATININE 0.86 11/09/2017 0831   CALCIUM 8.2 (L) 07/11/2021 0817   PROT 4.9 (L) 07/11/2021 0817   ALBUMIN 1.7 (L) 07/11/2021 0817   AST 25 07/11/2021 0817   AST 23 11/09/2017 0831   ALT 13 07/11/2021 0817   ALT 34 11/09/2017 0831   ALKPHOS 193 (H) 07/11/2021 0817   BILITOT 0.3 07/11/2021 0817   BILITOT 0.4 11/09/2017 0831   GFRNONAA >60  07/11/2021 0817   GFRNONAA >60 11/09/2017 0831   GFRAA >60 04/28/2018 1439   GFRAA >60 11/09/2017 0831   Lipase     Component Value Date/Time   LIPASE 27 06/28/2021 1644       Studies/Results: MR THORACIC SPINE W WO CONTRAST  Result Date: 07/10/2021 CLINICAL DATA:  Compression fracture EXAM: MRI THORACIC AND LUMBAR SPINE WITHOUT AND WITH CONTRAST TECHNIQUE: Multiplanar and multiecho pulse sequences of the thoracic and lumbar spine were obtained without and with intravenous contrast. CONTRAST:  6.39mL GADAVIST GADOBUTROL 1 MMOL/ML IV SOLN COMPARISON:  05/15/2021 FINDINGS: MRI THORACIC SPINE FINDINGS Alignment:  Physiologic. Vertebrae: There are metastatic lesions at all vertebral levels. The largest lesions are at T5, T8 and T11. At T11 there is a compression deformity with 50% height loss and retropulsion measuring 4 mm. Height loss has progressed. Cord:  Normal signal and morphology. Paraspinal and other soft tissues: 3.7 cm area of peripheral enhancement at the right lung base. Disc levels: Mild spinal canal stenosis at the T11 level. Spinal canal is otherwise widely patent. Mild contrast enhancement of the ventral epidural space at the T11 level is unchanged. MRI LUMBAR SPINE FINDINGS Segmentation:  Standard. Alignment:  Physiologic. Vertebrae: Metastatic lesions at all levels, worst  at L3 and L4. At L4, there is an area ventral epidural contrast enhancement. No clear compression fracture. Conus medullaris: Extends to the L1 level and appears normal. Paraspinal and other soft tissues: Negative. Disc levels: L1-L2: Normal disc space and facet joints. No spinal canal stenosis. No neural foraminal stenosis. L2-L3: Normal disc space and facet joints. No spinal canal stenosis. No neural foraminal stenosis. L3-L4: Left asymmetric disc bulge. Left lateral recess narrowing without central spinal canal stenosis. No neural foraminal stenosis. L4-L5: Small disc bulge. Abnormal ventral epidural enhancement. No  spinal canal stenosis. No neural foraminal stenosis. L5-S1: Normal disc space and facet joints. No spinal canal stenosis. No neural foraminal stenosis. Visualized sacrum: Normal. IMPRESSION: 1. Diffuse osseous metastatic disease throughout the thoracic and lumbar spine. 2. Progressive height loss of T11 pathologic fracture with unchanged thin epidural enhancement, possibly epidural tumor extension. 3. Abnormal ventral epidural enhancement at L4 is consistent with epidural spread of tumor. No associated stenosis. 4. 3.7 cm right lung mass Electronically Signed   By: Ulyses Jarred M.D.   On: 07/10/2021 23:12   MR Lumbar Spine W Wo Contrast  Result Date: 07/10/2021 CLINICAL DATA:  Compression fracture EXAM: MRI THORACIC AND LUMBAR SPINE WITHOUT AND WITH CONTRAST TECHNIQUE: Multiplanar and multiecho pulse sequences of the thoracic and lumbar spine were obtained without and with intravenous contrast. CONTRAST:  6.66mL GADAVIST GADOBUTROL 1 MMOL/ML IV SOLN COMPARISON:  05/15/2021 FINDINGS: MRI THORACIC SPINE FINDINGS Alignment:  Physiologic. Vertebrae: There are metastatic lesions at all vertebral levels. The largest lesions are at T5, T8 and T11. At T11 there is a compression deformity with 50% height loss and retropulsion measuring 4 mm. Height loss has progressed. Cord:  Normal signal and morphology. Paraspinal and other soft tissues: 3.7 cm area of peripheral enhancement at the right lung base. Disc levels: Mild spinal canal stenosis at the T11 level. Spinal canal is otherwise widely patent. Mild contrast enhancement of the ventral epidural space at the T11 level is unchanged. MRI LUMBAR SPINE FINDINGS Segmentation:  Standard. Alignment:  Physiologic. Vertebrae: Metastatic lesions at all levels, worst at L3 and L4. At L4, there is an area ventral epidural contrast enhancement. No clear compression fracture. Conus medullaris: Extends to the L1 level and appears normal. Paraspinal and other soft tissues: Negative.  Disc levels: L1-L2: Normal disc space and facet joints. No spinal canal stenosis. No neural foraminal stenosis. L2-L3: Normal disc space and facet joints. No spinal canal stenosis. No neural foraminal stenosis. L3-L4: Left asymmetric disc bulge. Left lateral recess narrowing without central spinal canal stenosis. No neural foraminal stenosis. L4-L5: Small disc bulge. Abnormal ventral epidural enhancement. No spinal canal stenosis. No neural foraminal stenosis. L5-S1: Normal disc space and facet joints. No spinal canal stenosis. No neural foraminal stenosis. Visualized sacrum: Normal. IMPRESSION: 1. Diffuse osseous metastatic disease throughout the thoracic and lumbar spine. 2. Progressive height loss of T11 pathologic fracture with unchanged thin epidural enhancement, possibly epidural tumor extension. 3. Abnormal ventral epidural enhancement at L4 is consistent with epidural spread of tumor. No associated stenosis. 4. 3.7 cm right lung mass Electronically Signed   By: Ulyses Jarred M.D.   On: 07/10/2021 23:12    Anti-infectives: Anti-infectives (From admission, onward)    Start     Dose/Rate Route Frequency Ordered Stop   06/28/21 2200  piperacillin-tazobactam (ZOSYN) IVPB 3.375 g  Status:  Discontinued        3.375 g 12.5 mL/hr over 240 Minutes Intravenous Every 8 hours 06/28/21 2120 07/04/21 0841  Assessment/Plan Intussusception of cecum  S/P laparoscopic assisted partial R colectomy, end ileostomy 06/29/21 Dr. Marcello Moores POD#13 - Path consistent with more metastatic disease, patient has known lung and sigmoid colon mets. Poor prognosis but she is not ready for hospital. Palliative and oncology have seen, working on dispo: SNF vs home with [facility] - Abdominal exam is benign. Tolerating diet and ileostomy working. No indication for any further surgery. Encourage PO intake. We will see PRN over the weekend   FEN: Regular diet ID: Zosyn stopped 2/22 VTE: SCD's, hep gtt  Foley: out     Cardiac  arrest, inferior STEMI  Metastatic breast cancer  PMH GIB HTN Acute on chronic anemia  LLE DVT - on [place] 2/23    LOS: 14 days    Wellington Hampshire, [facility] Surgery 07/12/2021, 9:20 AM Please see Amion for pager number during day hours 7:00am-4:30pm

## 2021-07-12 NOTE — Consult Note (Signed)
[place] Nurse ostomy follow up ?Stoma type/location: RLQ ileostomy ?Stomal assessment/size: 1 and 1/2 inch round, red, moist, budded. Lumen in center. ?Peristomal assessment: intact, clear. One staple remains at the umbilicus. ?Treatment options for stomal/peristomal skin: skin barrier ring ?Output: brown soft seedy stool ?Ostomy pouching: 2pc. I applied a 2 and 1/4 inch pouching system with skin barrier ring today for a smaller footprint on her abdomen.  ?Education provided:  ?None today. Patient is weaker and less animated today than she was on Monday when I saw her for a pouch change session during which she participated (see that note). She is receiving blood. She closes her eyes for most of our our Encounter but pats my hand and smiles at me as I finish the pouch change, straighten her bed linens, bring her tray closer and place her phone near her hand. ? ?Supplies in room: several pouch change set-ups, in both the 2 and 1/4 inch and 2 and 3/4 inch size.  Removal of singular staple at umbilicus next week is recommended.  ? ?Enrolled patient in [place] Start Discharge program: Yes ? ?[place] nursing team will follow, and will remain available to this patient, the nursing and medical teams.   ?Thanks, ?Maudie Flakes, MSN, RN, [place], [place], CWON-AP, [place]  ?Pager# [phone]  ?

## 2021-07-12 NOTE — Progress Notes (Signed)
Patient ID: Robin Estrada, female   DOB: [birth year], 76 y.o.   MRN: [id number] ?Spoke with patient's son today regarding tentative plans for kyphoplasty/osteocool ablation of symptomatic vertebral body levels outlined in previous note.  Details/risks of procedure, including but not limited to, internal bleeding, infection, injury to adjacent structures, nerve injury, inability to eradicate back pain discussed with him with his apparent understanding and consent.  At this time we are awaiting insurance approval.  We will continue to monitor and prepare patient for procedure accordingly once approval received. ?

## 2021-07-12 NOTE — Care Management Important Message (Signed)
Important Message ? ?Patient Details IM Letter placed in Patients room. ?Name: Robin Estrada ?MRN: [id number] ?Date of Birth: December 21, 1945 ? ? ?Medicare Important Message Given:  Yes ? ? ? ? ?Kerin Salen ?07/12/2021, 10:37 AM ?

## 2021-07-12 NOTE — Progress Notes (Signed)
ANTICOAGULATION CONSULT NOTE   ? ?Pharmacy Consult for heparin  bridge therapy while apixaban on hold ?Indication: DVT ? ?Allergies  ?Allergen Reactions  ? Statins Nausea And Vomiting and Other (See Comments)  ?  MUSCLE PAIN  ? Amlodipine   ?  fatigue  ? Clonidine Derivatives   ?  headache  ? Losartan   ?  abd pain  ? Sulfa Antibiotics Nausea Only  ? Sulfamethoxazole-Trimethoprim Nausea Only  ?  " Severe Nausea "  ? ? ?Patient Measurements: ?Height: 5" (152.4 cm) ?Height: 5' (152.4 cm) ?Weight: 60.9 kg (134 lb 4.2 oz) ?IBW/kg (Calculated) : 45.5 ? ? ?Vital Signs: ?BP: 126/71 (03/03 4270) ?Pulse Rate: 108 (03/03 0812) ? ?Labs: ?Recent Labs  ?  07/10/21 ?0500 07/10/21 ?6237 07/10/21 ?1928 07/10/21 ?2336 07/11/21 ?6283 07/12/21 ?0543  ?HGB 7.8*  --   --   --  7.8* 7.1*  ?HCT 25.8*  --   --   --  26.9* 23.9*  ?PLT 349  --   --   --  334 317  ?APTT  --  46*   < > 98* 86* 102*  ?LABPROT  --   --   --   --   --  14.7  ?INR  --   --   --   --   --  1.2  ?HEPARINUNFRC  --  >1.10*  --   --  >1.10* 0.61  ?CREATININE 0.49  --   --   --  0.39*  --   ? < > = values in this interval not displayed.  ? ? ? ?Estimated Creatinine Clearance: 48.8 mL/min (A) (by C-G formula based on SCr of 0.39 mg/dL (L)). ? ? ?Medical History: ?Past Medical History:  ?Diagnosis Date  ? Breast mass, left   ? Large  ? Cancer [facility])   ? FIBROMYOMA  AGE 13-   BENIGN  RESOLVED  ? GI bleed 05/03/2021  ? 1/23 continue with pantoprazole  ? Headache   ? Heart murmur   ? Hypertension   ? Mitral valve prolapse   ? Tuberculosis   ? AS CHILD  W/ TX  ? ? ?Medications:  ?No anticoagulants PTA  ? ?Assessment: ?76 y/o female with PMH of metastatic breast cancer who is post-op lap R colectomy, end ileostomy.  Experienced STEMI and cardiac arrest in PACU on 2/18. Started on Aspirin 81mg  daily and received 48 hours of IV heparin 2/19-2/21, then transitioned to Enoxaparin 40mg  SQ q24h for VTE prophylaxis (last dose 2/24 at 1132). Bilateral lower extremity venous duplex  today + for LLE DVT. Pharmacy consulted for Apixaban dosing on 07/04/21. ? ?On 07/10/21 pharmacy was consulted to dose heparin for bridge therapy while apixaban is on hold for planned IR procedure  ?Last dose apixaban 10 mg 2/28 @ 2208 ? ? ?07/12/21 ?Heparin level therapeutic (0.61) on 950 units/hr infusion ?aPTT increasing, now at upper end of therapeutic range ?Hgb falling, orders for transfusion today noted ?RN indicates no overt bleeding noted and confirms heparin infusion rate ? ?Goal of Therapy: ?aPTT 66-102 sec ?Heparin level 0.3-0.7 ? ?Plan:  ?Reduce heparin infusion to 900 units/hr ?Repeat aPTT and heparin level at 5 pm. ?Will dc aPTTs and use only heparin levels for monitoring when correlation confirmed ?F/u timing of kyphoplasty & when IR wants to hold heparin drip  ?Daily heparin level, CBC ? ?Clayburn Pert, PharmD, BCPS ?[place] [phone] ?07/12/2021  9:08 AM ? ?

## 2021-07-12 NOTE — Progress Notes (Signed)
Assessed pt pain level. Pt stated pain level is zero on a scale 0-10. Video interpreter utilized.  ?

## 2021-07-12 NOTE — Progress Notes (Addendum)
Physical Therapy Treatment ?Patient Details ?Name: Robin Estrada ?MRN: [id number] ?DOB: 20-Sep-1945 ?Today's Date: 07/12/2021 ? ? ?History of Present Illness 76 year old [name] with PMH significant of lung cancer, history of breast cancer, new vertebral masses presents in the ER with several days history of worsening abdominal pain, and nausea. CT abdomen with IV contrast showed large intussusception of the cecum and distal ileum within the length of the ascending colon and subsequent dilatation of the ascending colon. There are also noted various and numerous lytic lesions throughout the thoracolumbar spine and pelvis consistent with metastatic disease. General surgery was consulted.  She underwent laparoscopic right colectomy with end ileostomy on 06/29/2021.  She had a post-op cardiac arrest and inferior STEMI-- Cardiology consulted, Not a candidate for cardiac intervention ? ?  ?PT Comments  ? ? Pt with increased pain and fatigue/lethargy this session requiring up to MAX A for transfers to/from [facility] (suspect some lethargy/fatigue due to drop in Hgb and Hct lab values today compared to prior), RN and MD aware of PT session. Pending pt progress, may need to update d/c recs. Per EMR, planned for tentative kyphoplasty and hopeful for improvements in activity tolerance with increased lab values. Pt will benefit from continued skilled PT to increase their independence and maximize safety with mobility.  ?  ?Recommendations for follow up therapy are one component of a multi-disciplinary discharge planning process, led by the attending physician.  Recommendations may be updated based on patient status, additional functional criteria and insurance authorization. ? ?Follow Up Recommendations ? Home health PT (may update pending progress) ?  ?  ?Assistance Recommended at Discharge Frequent or constant Supervision/Assistance  ?Patient can return home with the following A lot of help with walking and/or transfers;A lot of  help with bathing/dressing/bathroom;Assist for transportation;Help with stairs or ramp for entrance;Assistance with cooking/housework ?  ?Equipment Recommendations ? Rolling walker (2 wheels);BSC/3in1  ?  ?Recommendations for Other Services   ? ? ?  ?Precautions / Restrictions Precautions ?Precautions: Fall ?Precaution Comments: R LQ ileostomy ?Restrictions ?Weight Bearing Restrictions: No ?RLE Weight Bearing: Weight bearing as tolerated  ?  ? ?Mobility ? Bed Mobility ?Overal bed mobility: Needs Assistance ?Bed Mobility: Supine to Sit, Sit to Supine ?  ?  ?Supine to sit: Min guard, HOB elevated ?Sit to supine: Mod assist ?  ?General bed mobility comments: increased time; assist for B LEs back onto EOB. Intermittent poor seated balance with posterior leaning while EOB requiring up to MOD A to return to midline. ?  ? ?Transfers ?Overall transfer level: Needs assistance ?Equipment used: Rolling walker (2 wheels) ?Transfers: Sit to/from Stand, Bed to chair/wheelchair/BSC ?Sit to Stand: Max assist ?  ?  ?Squat pivot transfers: Max assist ?  ?  ?General transfer comment: pt required MAX assist for stand pivot to [facility]. MAX A and squat pivot with assist at hips to turn to EOB and clear BSC armrest. ?  ? ?Ambulation/Gait ?  ?  ?  ?  ?  ?  ?  ?General Gait Details: Unable to safely progress to ambulation today ? ? ?Stairs ?  ?  ?  ?  ?  ? ? ?Wheelchair Mobility ?  ? ?Modified Rankin (Stroke Patients Only) ?  ? ? ?  ?Balance Overall balance assessment: Needs assistance ?Sitting-balance support: Feet supported, Bilateral upper extremity supported ?Sitting balance-Leahy Scale: Poor ?Sitting balance - Comments: required UE support or support from therapist ?  ?Standing balance support: Bilateral upper extremity supported, During functional activity, Reliant on  assistive device for balance ?Standing balance-Leahy Scale: Poor ?  ?  ?  ?  ?  ?  ?  ?  ?  ?  ?  ?  ?  ? ?  ?Cognition Arousal/Alertness: Awake/alert ?Behavior During  Therapy: [facility] for tasks assessed/performed ?Overall Cognitive Status: Within Functional Limits for tasks assessed ?  ?  ?  ?  ?  ?  ?  ?  ?  ?  ?  ?  ?  ?  ?  ?  ?  ?  ?  ? ?  ?Exercises   ? ?  ?General Comments   ?  ?  ? ?Pertinent Vitals/Pain Pain Assessment ?Pain Assessment: Faces ?Faces Pain Scale: Hurts little more ?Pain Location: Rt hip/thigh ?Pain Descriptors / Indicators: Sore ?Pain Intervention(s): Limited activity within patient's tolerance, Monitored during session, Repositioned  ? ? ?Home Living   ?  ?  ?  ?  ?  ?  ?  ?  ?  ?   ?  ?Prior Function    ?  ?  ?   ? ?PT Goals (current goals can now be found in the care plan section) Acute Rehab PT Goals ?Patient Stated Goal: stop hurting and to rest ?PT Goal Formulation: With patient ?Time For Goal Achievement: 07/17/21 ?Potential to Achieve Goals: Fair ?Progress towards PT goals: Not progressing toward goals - comment (increased fatigue/pain this session) ? ?  ?Frequency ? ? ? Min 3X/week ? ? ? ?  ?PT Plan Current plan remains appropriate  ? ? ?Co-evaluation   ?  ?  ?  ?  ? ?  ?AM-PAC PT "6 Clicks" Mobility   ?Outcome Measure ? Help needed turning from your back to your side while in a flat bed without using bedrails?: A Lot ?Help needed moving from lying on your back to sitting on the side of a flat bed without using bedrails?: A Lot ?Help needed moving to and from a bed to a chair (including a wheelchair)?: A Lot ?Help needed standing up from a chair using your arms (e.g., wheelchair or bedside chair)?: A Lot ?Help needed to walk in hospital room?: A Lot ?Help needed climbing 3-5 steps with a railing? : Total ?6 Click Score: 11 ? ?  ?End of Session Equipment Utilized During Treatment: Gait belt ?Activity Tolerance: Patient limited by fatigue ?Patient left: in bed;with bed alarm set;with call bell/phone within reach ?Nurse Communication: Mobility status ?PT Visit Diagnosis: Muscle weakness (generalized) (M62.81);Difficulty in walking, not elsewhere  classified (R26.2);Other abnormalities of gait and mobility (R26.89) ?  ? ? ?Time: 4166-0630 ?PT Time Calculation (min) (ACUTE ONLY): 33 min ? ?Charges:  $Therapeutic Activity: 23-37 mins          ?          ? ?Festus Barren., PT, DPT  ?Acute Rehabilitation Services  ?Office [phone] ? ?07/12/2021, 4:47 PM ? ?
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
## 2021-07-12 NOTE — Progress Notes (Signed)
ANTICOAGULATION CONSULT NOTE   ? ?Pharmacy Consult for heparin  bridge therapy while apixaban on hold ?Indication: DVT ? ?Allergies  ?Allergen Reactions  ? Statins Nausea And Vomiting and Other (See Comments)  ?  MUSCLE PAIN  ? Amlodipine   ?  fatigue  ? Clonidine Derivatives   ?  headache  ? Losartan   ?  abd pain  ? Sulfa Antibiotics Nausea Only  ? Sulfamethoxazole-Trimethoprim Nausea Only  ?  " Severe Nausea "  ? ? ?Patient Measurements: ?Height: 5" (152.4 cm) ?Height: 5' (152.4 cm) ?Weight: 60.9 kg (134 lb 4.2 oz) ?IBW/kg (Calculated) : 45.5 ? ? ?Vital Signs: ?Temp: 97.8 ?F (36.6 ?C) (03/03 1440) ?Temp Source: Oral (03/03 1440) ?BP: 126/58 (03/03 1440) ?Pulse Rate: 95 (03/03 1440) ? ?Labs: ?Recent Labs  ?  07/10/21 ?0500 07/10/21 ?0388 07/11/21 ?8280 07/12/21 ?0349 07/12/21 ?1658  ?HGB 7.8*  --  7.8* 7.1*  --   ?HCT 25.8*  --  26.9* 23.9*  --   ?PLT 349  --  334 317  --   ?APTT  --    < > 86* 102* 82*  ?LABPROT  --   --   --  14.7  --   ?INR  --   --   --  1.2  --   ?HEPARINUNFRC  --    < > >1.10* 0.61 0.52  ?CREATININE 0.49  --  0.39*  --   --   ? < > = values in this interval not displayed.  ? ? ? ?Estimated Creatinine Clearance: 48.8 mL/min (A) (by C-G formula based on SCr of 0.39 mg/dL (L)). ? ? ?Medical History: ?Past Medical History:  ?Diagnosis Date  ? Breast mass, left   ? Large  ? Cancer [facility])   ? FIBROMYOMA  AGE 74-   BENIGN  RESOLVED  ? GI bleed 05/03/2021  ? 1/23 continue with pantoprazole  ? Headache   ? Heart murmur   ? Hypertension   ? Mitral valve prolapse   ? Tuberculosis   ? AS CHILD  W/ TX  ? ? ?Medications:  ?No anticoagulants PTA  ? ?Assessment: ?76 y/o female with PMH of metastatic breast cancer who is post-op lap R colectomy, end ileostomy.  Experienced STEMI and cardiac arrest in PACU on 2/18. Started on Aspirin 81mg  daily and received 48 hours of IV heparin 2/19-2/21, then transitioned to Enoxaparin 40mg  SQ q24h for VTE prophylaxis (last dose 2/24 at 1132). Bilateral lower extremity  venous duplex today + for LLE DVT. Pharmacy consulted for Apixaban dosing on 07/04/21. ? ?On 07/10/21 pharmacy was consulted to dose heparin for bridge therapy while apixaban is on hold for planned IR procedure  ?Last dose apixaban 10 mg 2/28 @ 2208 ? ?07/12/21 PM UPDATE ?Heparin level therapeutic (0.52) on heparin at 900 units/hr ?aPTT therapeutic at 82 seconds ?RN indicates no overt bleeding noted and confirms heparin infusion rate ? ?Goal of Therapy: ?aPTT 66-102 sec ?Heparin level 0.3-0.7 ? ?Plan:  ?Continue heparin infusion at 900 units/hr ?D/c aPTT checks and use only heparin levels for monitoring now that heparin levels and aPTT are correlating ?F/u timing of kyphoplasty & when IR wants to hold heparin drip  ?Daily heparin level, CBC ? ?Dimple Nanas, PharmD ?07/12/2021 5:31 PM ? ? ?

## 2021-07-13 DIAGNOSIS — K561 Intussusception: Secondary | ICD-10-CM | POA: Diagnosis not present

## 2021-07-13 LAB — CBC WITH DIFFERENTIAL/PLATELET
Abs Immature Granulocytes: 0.29 10*3/uL — ABNORMAL HIGH (ref 0.00–0.07)
Basophils Absolute: 0.1 10*3/uL (ref 0.0–0.1)
Basophils Relative: 0 %
Eosinophils Absolute: 0.2 10*3/uL (ref 0.0–0.5)
Eosinophils Relative: 1 %
HCT: 31.4 % — ABNORMAL LOW (ref 36.0–46.0)
Hemoglobin: 9.5 g/dL — ABNORMAL LOW (ref 12.0–15.0)
Immature Granulocytes: 2 %
Lymphocytes Relative: 3 %
Lymphs Abs: 0.6 10*3/uL — ABNORMAL LOW (ref 0.7–4.0)
MCH: 27.5 pg (ref 26.0–34.0)
MCHC: 30.3 g/dL (ref 30.0–36.0)
MCV: 91 fL (ref 80.0–100.0)
Monocytes Absolute: 0.9 10*3/uL (ref 0.1–1.0)
Monocytes Relative: 5 %
Neutro Abs: 15.3 10*3/uL — ABNORMAL HIGH (ref 1.7–7.7)
Neutrophils Relative %: 89 %
Platelets: 320 10*3/uL (ref 150–400)
RBC: 3.45 MIL/uL — ABNORMAL LOW (ref 3.87–5.11)
RDW: 24.4 % — ABNORMAL HIGH (ref 11.5–15.5)
WBC: 17.4 10*3/uL — ABNORMAL HIGH (ref 4.0–10.5)
nRBC: 0 % (ref 0.0–0.2)

## 2021-07-13 LAB — COMPREHENSIVE METABOLIC PANEL
ALT: 13 U/L (ref 0–44)
AST: 23 U/L (ref 15–41)
Albumin: 1.7 g/dL — ABNORMAL LOW (ref 3.5–5.0)
Alkaline Phosphatase: 208 U/L — ABNORMAL HIGH (ref 38–126)
Anion gap: 8 (ref 5–15)
BUN: 12 mg/dL (ref 8–23)
CO2: 25 mmol/L (ref 22–32)
Calcium: 8.5 mg/dL — ABNORMAL LOW (ref 8.9–10.3)
Chloride: 97 mmol/L — ABNORMAL LOW (ref 98–111)
Creatinine, Ser: 0.55 mg/dL (ref 0.44–1.00)
GFR, Estimated: >60 mL/min (ref >60)
Glucose, Bld: 109 mg/dL — ABNORMAL HIGH (ref 70–99)
Potassium: 3.5 mmol/L (ref 3.5–5.1)
Sodium: 130 mmol/L — ABNORMAL LOW (ref 135–145)
Total Bilirubin: 0.5 mg/dL (ref 0.3–1.2)
Total Protein: 5.3 g/dL — ABNORMAL LOW (ref 6.5–8.1)

## 2021-07-13 LAB — RETICULOCYTES
Immature Retic Fract: 30.7 % — ABNORMAL HIGH (ref 2.3–15.9)
RBC.: 3.42 MIL/uL — ABNORMAL LOW (ref 3.87–5.11)
Retic Count, Absolute: 72.2 10*3/uL (ref 19.0–186.0)
Retic Ct Pct: 2.1 % (ref 0.4–3.1)

## 2021-07-13 LAB — FOLATE: Folate: 5.1 ng/mL — ABNORMAL LOW (ref >5.9)

## 2021-07-13 LAB — HEPARIN LEVEL (UNFRACTIONATED)
Heparin Unfractionated: 0.25 IU/mL — ABNORMAL LOW (ref 0.30–0.70)
Heparin Unfractionated: 0.3 IU/mL (ref 0.30–0.70)
Heparin Unfractionated: 0.2 IU/mL — ABNORMAL LOW (ref 0.30–0.70)

## 2021-07-13 LAB — IRON AND TIBC
Iron: 26 ug/dL — ABNORMAL LOW (ref 28–170)
Saturation Ratios: 21 % (ref 10.4–31.8)
TIBC: 126 ug/dL — ABNORMAL LOW (ref 250–450)
UIBC: 100 ug/dL

## 2021-07-13 LAB — VITAMIN B12: Vitamin B-12: 755 pg/mL (ref 180–914)

## 2021-07-13 LAB — FERRITIN: Ferritin: 879 ng/mL — ABNORMAL HIGH (ref 11–307)

## 2021-07-13 MED ORDER — HEPARIN (PORCINE) 25000 UT/250ML-% IV SOLN
1250.0000 [IU]/h | INTRAVENOUS | Status: DC
Start: 2021-07-13 — End: 2021-07-17
  Administered 2021-07-13: 1000 [IU]/h via INTRAVENOUS
  Administered 2021-07-14 – 2021-07-16 (×3): 1200 [IU]/h via INTRAVENOUS
  Administered 2021-07-17: 1250 [IU]/h via INTRAVENOUS
  Filled 2021-07-13 (×7): qty 250

## 2021-07-13 MED ORDER — SODIUM CHLORIDE 0.9 % IV SOLN
510.0000 mg | Freq: Once | INTRAVENOUS | Status: AC
  Administered 2021-07-13: 510 mg via INTRAVENOUS
  Filled 2021-07-13: qty 17

## 2021-07-13 NOTE — Progress Notes (Signed)
?   07/13/21 2157  ?Vitals  ?Temp 98.1 ?F (36.7 ?C)  ?Temp Source Oral  ?BP (!) 92/44  ?MAP (mmHg) (!) 59  ?BP Location Right Arm  ?BP Method Automatic  ?Patient Position (if appropriate) Lying  ?Pulse Rate 95  ?Pulse Rate Source Monitor  ?Resp 18  ?MEWS COLOR  ?MEWS Score Color Green  ?Oxygen Therapy  ?SpO2 (!) 86 % ?(RN notified)  ?O2 Device Room Air  ?MEWS Score  ?MEWS Temp 0  ?MEWS Systolic 1  ?MEWS Pulse 0  ?MEWS RR 0  ?MEWS LOC 0  ?MEWS Score 1  ? ?Pt has just had PM meds , encouraged to deep breath and Sats up to 90% ?

## 2021-07-13 NOTE — Progress Notes (Signed)
?Progress Note ? ? ?Patient: Robin Estrada WCH:[id number] DOB: Oct 27, 1945 DOA: 06/28/2021     15 ?DOS: the patient was seen and examined on 07/13/2021 ?  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  ?CT abdomen with IV contrast showed large intussusception of the cecum and distal ileum within the length of the ascending colon and subsequent dilatation of the ascending colon.  ? ?General surgery was consulted.  She underwent laparoscopic right colectomy with end ileostomy on 06/29/2021. ?She had a post-op cardiac arrest and inferior STEMI-- Cardiology consulted, Not a candidate for cardiac intervention.  ?Currently we await insurance authorization for intercoastal procedure/kyphoplasty for nonpharmacological relief of her severe metastatic bone pain-earliest this will be performed is on 07/16/2021 according to family who have spoken with IR ? ?Assessment and Plan: ?Large Cecal and distal ileum intussusception with bowel obstruction: ?-Patient presented with severe abdominal pain associated with nausea. ?-S/p laparoscopic right colectomy with end ileostomy 2/18 ?-Completed course of zosyn ?-Appreciate general surgery , ileostomy working. ?Per [facility], leukocytosis likely not from intra-abdominal source---  ?CT repeat 3/1 shows small area of intussusception but she does have metastatic disease ?-Continue to monitor white count-poor surgical recurrent candidate ? ?Acute blood loss anemia ?-Likely postoperative and dilutional component--no reports of dark stool ?-Transfuse 1 unit blood 3/3 with acceptable rise ?-IV iron given secondary to low sats on 3/4 ? ?Inferior STEMI / Postop cardiac arrest ?Ischemic cardiomyopathy; ?-Troponin peaked to 24,000.   ?-TTE showed LV ejection fraction of 50%, the left ventricle demonstrated RWMA.  Mild  LVH,  diastolic parameters indeterminate. ?--On heparin gtt.-when all procedures performed continue Eliquis, no aspirin ?-Pt refusing statin, zetia, imdur ?-Cardiology feels that she is a poor cath candidate and have signed off ?  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  ?Pathologic fracture deformity at the level of T11: ?-Status post left total mastectomy in 2019.   ?-Patient underwent CT-guided lung biopsy on 05/14/2021- results consistent of poorly differentiated malignancy with sarcomatoid and epithelioid features.  ?-Await insurances --proceed with osteocool and kyphoplasty at least at T11, but also potentially at L3, L4 and L5  ?-I appreciate IR coordinating with patient and family and explaining the procedure to them ?-Patient is not accepting of hospice philosophy despite detailed discussions by her oncologist ?-Pain moderately controlled oral oxy-Dilaudid 0.5 every 3 as needed severe pain ?-Had a detailed discussion with the patient's son at the bedside on 3/4 ?  ?Hypokalemia:  ?-Replaced ?  ?Hypomagnesemia:  ?-Periodic rechecks ?  ?History of GI bleed: ?-Continue pantoprazole ?  ?Essential hypertension: ?-Noted to have refused imdur ?-Continue Coreg 6.25 twice daily. ?  ?Hyponatremia ?-Normalizing ?  ?Aortic atherosclerosis: Noted on recent CT ?  ?Cystic mass of pancreas: ?-Previous CA 19-9 level normal. ?-Follow-up outpatient. ? ?Acute LLE DVT left posterior tibial vein 2/23 ?-Continues on heparin until all procedures are done ? ?  ?Poor overall prognosis, palliative care consulted   ?Further planning as per IR and oncology ?Pain control will be contingent on palliative care's input- ?Await procedure and then likely can discharge possibly to either  skilled facility and or get hired help according to  Education officer, museum input ? ?Therapy is recommending home health PT rolling walker with 2 wheels and bedside commode ? ? ? ?Subjective:  ? ?When I saw patient very flat affect seem resigned to staying here-moderate eye contact awake coherent complaining of left shin left upper thigh pain-worsened with movement ?She is having a reasonable breakfast-her son is at the bedside ? ? ?Physical Exam: ?Vitals:  ? 07/13/21 1138 07/13/21 1156 07/13/21 1207 07/13/21 1329  ?BP: 123/60 (!) 110/55 (!) 114/58 (!) 124/59  ?Pulse: 95 94 93 91  ?Resp: 15 14 16 17   ?Temp: 98.2 ?F (36.8 ?C) 98.2 ?F (36.8 ?C)  98.3 ?F (36.8 ?C)  ?TempSrc: Oral Oral  Oral  ?SpO2: 91% 91%  91%  ?Weight:      ?Height:      ? ? ?More awake coherent no icterus no pallor mucosa slightly dry ?ROM intact moving 4 limbs reasonably ?No significant edema ?Abdomen soft with ostomy in place with liquid stool ?Slight distention to lower abdomen ?Neuro intact to power-reflexes not tested-limitation of power on right side secondary to pain ? ?Data Reviewed: ? ?Labs reviewed, Na 130 white count now 17 down from 21  ?hemoglobin 7.1-->9.5 posttransfusion ? ?Family Communication: Discussed with the patient's son Slava at the bedside  ? ?Disposition: ?Status is: Inpatient ?Remains inpatient appropriate because: Severity of illness ? ? Planned Discharge Destination:  SNF eventually when all work-up and procedures are done. ? ? ?Author: ?Nita Sells, MD ?07/13/2021 2:05 PM ? ?For on call review [email or web address].  ? ?

## 2021-07-13 NOTE — Progress Notes (Signed)
Pharmacy: Re-heparin ? ?Patient is a 76 y.o F currently on  heparin drip for acute DVT  with plan for possible kyphoplasty procedure next week. ? ?- Heparin level collected at ~5p is therapeutic at 0.30 after rate increased to 1000 units/hr earlier this morning. ? ?Goal of Therapy: ?Heparin level 0.3-0.7 units/mL ?Monitor platelets by anticoagulation protocol: Yes ? ?Plan: ?- continue heparin drip at 1000 units/hr ?- check another heparin level at 11p to ensure level is still therapeutic before changing to daily monitoring. ? ?Dia Sitter, PharmD, BCPS ?07/13/2021 5:53 PM ? ?

## 2021-07-13 NOTE — Progress Notes (Signed)
Pt states pain is a 10/10, in right leg, right hip, and upper back. Pt requested PRN IV Dilaudid for severe pain. Video interpreter utilized during this encounter.  ?

## 2021-07-13 NOTE — Progress Notes (Signed)
ANTICOAGULATION CONSULT NOTE   ? ?Pharmacy Consult for heparin  bridge therapy while apixaban on hold ?Indication: DVT ? ?Allergies  ?Allergen Reactions  ? Statins Nausea And Vomiting and Other (See Comments)  ?  MUSCLE PAIN  ? Amlodipine   ?  fatigue  ? Clonidine Derivatives   ?  headache  ? Losartan   ?  abd pain  ? Sulfa Antibiotics Nausea Only  ? Sulfamethoxazole-Trimethoprim Nausea Only  ?  " Severe Nausea "  ? ? ?Patient Measurements: ?Height: 5' (152.4 cm) ?Weight: 59.3 kg (130 lb 11.7 oz) ?IBW/kg (Calculated) : 45.5 ?Heparin dosing weight: 58 kg ? ? ?Vital Signs: ?Temp: 98.2 ?F (36.8 ?C) (03/04 1138) ?Temp Source: Oral (03/04 1138) ?BP: 123/60 (03/04 1138) ?Pulse Rate: 95 (03/04 1138) ? ?Labs: ?Recent Labs  ?  07/11/21 ?2947 07/12/21 ?0543 07/12/21 ?1658 07/13/21 ?0530  ?HGB 7.8* 7.1*  --  9.5*  ?HCT 26.9* 23.9*  --  31.4*  ?PLT 334 317  --  320  ?APTT 86* 102* 82*  --   ?LABPROT  --  14.7  --   --   ?INR  --  1.2  --   --   ?HEPARINUNFRC >1.10* 0.61 0.52 0.25*  ?CREATININE 0.39*  --   --  0.55  ? ? ?Estimated Creatinine Clearance: 48.2 mL/min (by C-G formula based on SCr of 0.55 mg/dL). ? ? ?Medical History: ?Past Medical History:  ?Diagnosis Date  ? Breast mass, left   ? Large  ? Cancer [facility])   ? FIBROMYOMA  AGE 30-   BENIGN  RESOLVED  ? GI bleed 05/03/2021  ? 1/23 continue with pantoprazole  ? Headache   ? Heart murmur   ? Hypertension   ? Mitral valve prolapse   ? Tuberculosis   ? AS CHILD  W/ TX  ? ? ?Medications:  ?No anticoagulants PTA  ? ?Assessment: ?76 y/o female with PMH of metastatic breast cancer who is post-op lap R colectomy, end ileostomy.  Experienced STEMI and cardiac arrest in PACU on 2/18. Started on Aspirin 81mg  daily and received 48 hours of IV heparin 2/19-2/21, then transitioned to Enoxaparin 40mg  SQ q24h for VTE prophylaxis (last dose 2/24 at 1132). Bilateral lower extremity venous duplex today + for LLE DVT. Pharmacy consulted for Apixaban dosing on 07/04/21. ? ?On 07/10/21 pharmacy  was consulted to dose heparin for bridge therapy while apixaban is on hold for planned IR procedure  ?Last dose apixaban 10 mg 2/28 @ 2208 ? ?07/13/21 ?Heparin level 0.25 units/mL, now subtherapeutic on heparin infusion at 900 units/hr ?CBC: Hgb low but improved to 9.5 after transfusion yesterday. Pltc WNL.  ?No IV site or infusion issues noted per nursing ?RN indicates no overt bleeding noted and confirms heparin infusion rate ? ?Goal of Therapy: ?Heparin level 0.3-0.7 units/mL ?Monitor platelets by anticoagulation protocol: Yes ? ?Plan:  ?Increase heparin infusion to 1000 units/hr ?Heparin level 8 hours after rate change ?Daily CBC, heparin level ?Monitor closely for s/sx of bleeding ?F/u timing of kyphoplasty & when IR wants to hold heparin infusion ? ? ?Lindell Spar, PharmD, BCPS ?Clinical Pharmacist  ?07/13/2021 11:51 AM ? ? ?

## 2021-07-13 NOTE — Progress Notes (Signed)
Son at the bedside , pt has eaten more dinner with him at the bedside. Very communicative. States pain under control at this time  ?

## 2021-07-14 ENCOUNTER — Inpatient Hospital Stay (HOSPITAL_COMMUNITY): Payer: Medicare HMO

## 2021-07-14 DIAGNOSIS — K561 Intussusception: Secondary | ICD-10-CM | POA: Diagnosis not present

## 2021-07-14 LAB — CBC WITH DIFFERENTIAL/PLATELET
Abs Immature Granulocytes: 0.59 10*3/uL — ABNORMAL HIGH (ref 0.00–0.07)
Basophils Absolute: 0.1 10*3/uL (ref 0.0–0.1)
Basophils Relative: 0 %
Eosinophils Absolute: 0.1 10*3/uL (ref 0.0–0.5)
Eosinophils Relative: 1 %
HCT: 29.5 % — ABNORMAL LOW (ref 36.0–46.0)
Hemoglobin: 9.1 g/dL — ABNORMAL LOW (ref 12.0–15.0)
Immature Granulocytes: 3 %
Lymphocytes Relative: 3 %
Lymphs Abs: 0.6 10*3/uL — ABNORMAL LOW (ref 0.7–4.0)
MCH: 27.6 pg (ref 26.0–34.0)
MCHC: 30.8 g/dL (ref 30.0–36.0)
MCV: 89.4 fL (ref 80.0–100.0)
Monocytes Absolute: 1 10*3/uL (ref 0.1–1.0)
Monocytes Relative: 5 %
Neutro Abs: 16.5 10*3/uL — ABNORMAL HIGH (ref 1.7–7.7)
Neutrophils Relative %: 88 %
Platelets: 299 10*3/uL (ref 150–400)
RBC: 3.3 MIL/uL — ABNORMAL LOW (ref 3.87–5.11)
RDW: 23.8 % — ABNORMAL HIGH (ref 11.5–15.5)
WBC: 18.9 10*3/uL — ABNORMAL HIGH (ref 4.0–10.5)
nRBC: 0 % (ref 0.0–0.2)

## 2021-07-14 LAB — COMPREHENSIVE METABOLIC PANEL
ALT: 12 U/L (ref 0–44)
AST: 26 U/L (ref 15–41)
Albumin: 1.6 g/dL — ABNORMAL LOW (ref 3.5–5.0)
Alkaline Phosphatase: 229 U/L — ABNORMAL HIGH (ref 38–126)
Anion gap: 8 (ref 5–15)
BUN: 12 mg/dL (ref 8–23)
CO2: 24 mmol/L (ref 22–32)
Calcium: 8.7 mg/dL — ABNORMAL LOW (ref 8.9–10.3)
Chloride: 99 mmol/L (ref 98–111)
Creatinine, Ser: 0.42 mg/dL — ABNORMAL LOW (ref 0.44–1.00)
GFR, Estimated: >60 mL/min (ref >60)
Glucose, Bld: 139 mg/dL — ABNORMAL HIGH (ref 70–99)
Potassium: 4 mmol/L (ref 3.5–5.1)
Sodium: 131 mmol/L — ABNORMAL LOW (ref 135–145)
Total Bilirubin: 0.5 mg/dL (ref 0.3–1.2)
Total Protein: 5.2 g/dL — ABNORMAL LOW (ref 6.5–8.1)

## 2021-07-14 LAB — HEPARIN LEVEL (UNFRACTIONATED)
Heparin Unfractionated: 0.28 IU/mL — ABNORMAL LOW (ref 0.30–0.70)
Heparin Unfractionated: 0.41 IU/mL (ref 0.30–0.70)

## 2021-07-14 MED ORDER — HEPARIN BOLUS VIA INFUSION
900.0000 [IU] | Freq: Once | INTRAVENOUS | Status: AC
  Administered 2021-07-14: 900 [IU] via INTRAVENOUS
  Filled 2021-07-14: qty 900

## 2021-07-14 MED ORDER — HEPARIN BOLUS VIA INFUSION
1500.0000 [IU] | Freq: Once | INTRAVENOUS | Status: AC
  Administered 2021-07-14: 1500 [IU] via INTRAVENOUS
  Filled 2021-07-14: qty 1500

## 2021-07-14 NOTE — Progress Notes (Signed)
?   07/14/21 0134  ?Vitals  ?Temp 98.5 ?F (36.9 ?C)  ?Temp Source Oral  ?BP (!) 101/52  ?MAP (mmHg) 67  ?BP Location Left Arm  ?BP Method Automatic  ?Patient Position (if appropriate) Lying  ?Pulse Rate 89  ?Pulse Rate Source Monitor  ?Resp 16  ?MEWS COLOR  ?MEWS Score Color Green  ?Oxygen Therapy  ?SpO2 (!) 89 %  ?O2 Device Room Air  ?MEWS Score  ?MEWS Temp 0  ?MEWS Systolic 0  ?MEWS Pulse 0  ?MEWS RR 0  ?MEWS LOC 0  ?MEWS Score 0  ? ?Placed pt on Oxygen 2LNC  ?

## 2021-07-14 NOTE — Progress Notes (Signed)
ANTICOAGULATION CONSULT NOTE   ? ?Pharmacy Consult for heparin  bridge therapy while apixaban on hold ?Indication: DVT ? ?Allergies  ?Allergen Reactions  ? Statins Nausea And Vomiting and Other (See Comments)  ?  MUSCLE PAIN  ? Amlodipine   ?  fatigue  ? Clonidine Derivatives   ?  headache  ? Losartan   ?  abd pain  ? Sulfa Antibiotics Nausea Only  ? Sulfamethoxazole-Trimethoprim Nausea Only  ?  " Severe Nausea "  ? ? ?Patient Measurements: ?Height: 5' (152.4 cm) ?Weight: 59.3 kg (130 lb 11.7 oz) ?IBW/kg (Calculated) : 45.5 ?Heparin dosing weight: 58 kg ? ? ?Vital Signs: ?Temp: 98.1 ?F (36.7 ?C) (03/04 2157) ?Temp Source: Oral (03/04 2157) ?BP: 92/44 (03/04 2157) ?Pulse Rate: 95 (03/04 2157) ? ?Labs: ?Recent Labs  ?  07/11/21 ?4174 07/12/21 ?0543 07/12/21 ?1658 07/13/21 ?0530 07/13/21 ?1655 07/13/21 ?2247  ?HGB 7.8* 7.1*  --  9.5*  --   --   ?HCT 26.9* 23.9*  --  31.4*  --   --   ?PLT 334 317  --  320  --   --   ?APTT 86* 102* 82*  --   --   --   ?LABPROT  --  14.7  --   --   --   --   ?INR  --  1.2  --   --   --   --   ?HEPARINUNFRC >1.10* 0.61 0.52 0.25* 0.30 0.20*  ?CREATININE 0.39*  --   --  0.55  --   --   ? ? ? ?Estimated Creatinine Clearance: 48.2 mL/min (by C-G formula based on SCr of 0.55 mg/dL). ? ? ?Medical History: ?Past Medical History:  ?Diagnosis Date  ? Breast mass, left   ? Large  ? Cancer [facility])   ? FIBROMYOMA  AGE 58-   BENIGN  RESOLVED  ? GI bleed 05/03/2021  ? 1/23 continue with pantoprazole  ? Headache   ? Heart murmur   ? Hypertension   ? Mitral valve prolapse   ? Tuberculosis   ? AS CHILD  W/ TX  ? ? ?Medications:  ?No anticoagulants PTA  ? ?Assessment: ?76 y/o female with PMH of metastatic breast cancer who is post-op lap R colectomy, end ileostomy.  Experienced STEMI and cardiac arrest in PACU on 2/18. Started on Aspirin 81mg  daily and received 48 hours of IV heparin 2/19-2/21, then transitioned to Enoxaparin 40mg  SQ q24h for VTE prophylaxis (last dose 2/24 at 1132). Bilateral lower  extremity venous duplex today + for LLE DVT. Pharmacy consulted for Apixaban dosing on 07/04/21. ? ?On 07/10/21 pharmacy was consulted to dose heparin for bridge therapy while apixaban is on hold for planned IR procedure  ?Last dose apixaban 10 mg 2/28 @ 2208 ? ?07/14/21 ?Heparin level 0.20 units/mL, now subtherapeutic on heparin infusion at 1000 units/hr ?3/4 CBC: Hgb low but improved to 9.5 after transfusion yesterday. Pltc WNL.  ?No IV site or infusion issues noted per nursing ?RN indicates no overt bleeding noted and confirms heparin infusion rate ? ?Goal of Therapy: ?Heparin level 0.3-0.7 units/mL ?Monitor platelets by anticoagulation protocol: Yes ? ?Plan:  ?Give 1500 units heparin bolus then increase heparin infusion to 1100 units/hr ?Heparin level 8 hours after rate change ?Daily CBC, heparin level ?Monitor closely for s/sx of bleeding ?F/u timing of kyphoplasty & when IR wants to hold heparin infusion ? ? ?Netta Cedars, PharmD, BCPS ?Clinical Pharmacist  ?07/14/2021 12:28 AM ? ? ?

## 2021-07-14 NOTE — Progress Notes (Signed)
?Progress Note ? ? ?PatientLeonore Estrada DGL:[id number] DOB: 05-15-1945 DOA: 06/28/2021     16 ?DOS: the patient was seen and examined on 07/14/2021 ?  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  ?CT abdomen with IV contrast showed large intussusception of the cecum and distal ileum within the length of the ascending colon and subsequent dilatation of the ascending colon.  ? ?General surgery was consulted.  She underwent laparoscopic right colectomy with end ileostomy on 06/29/2021. ?She had a post-op cardiac arrest and inferior STEMI-- Cardiology consulted, Not a candidate for cardiac intervention.  ?Currently we await insurance authorization for intercoastal procedure/kyphoplasty for nonpharmacological relief of her severe metastatic bone pain-earliest this will be performed is on 07/16/2021 according to family who have spoken with IR ? ?Assessment and Plan: ? ?Large Cecal and distal ileum intussusception with bowel obstruction: ?-Patient presented with severe abdominal pain associated with nausea. ?-S/p laparoscopic right colectomy with end ileostomy 2/18 ?-Completed course of zosyn ?-Appreciate general surgery , ileostomy working. ?Per [facility], leukocytosis likely not from intra-abdominal source---  ?CT repeat 3/1 shows small area of intussusception -- she does have metastatic disease ?-Continue to monitor white count-poor recurrent surg candidate ? ?Hypoxia ? 2/2 dilaudid IV and "splinting" of breaths? ?-demonstrated use of IS, 2/2  ?-Await CXR 2vw ? ?Acute blood loss anemia ?-Likely postoperative and dilutional component--no reports of dark stool ?-Transfuse 1 unit blood 3/3 with acceptable rise ?-IV iron given secondary to low iron sats on 3/4 ? ?Inferior STEMI / Postop cardiac arrest ?Ischemic cardiomyopathy; ?-Troponin  peaked to 24,000.   ?-TTE showed LV ejection fraction of 50%, the left ventricle demonstrated RWMA.  Mild LVH,  diastolic parameters indeterminate. ?--On heparin gtt.-when all procedures performed continue Eliquis, no aspirin ?-Pt refusing statin, zetia, imdur ?-Cardiology feels that she is a poor cath candidate and have signed off ?  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  ?Pathologic fracture deformity at the level of T11: ?-Status post left total mastectomy in 2019.   ?-Patient underwent CT-guided lung biopsy on 05/14/2021- results consistent of poorly differentiated malignancy with sarcomatoid and epithelioid features.  ?-first choice pain oxy--2nd choice IV dilaudid cautiously ?-Await insurances --proceed with osteocool and kyphoplasty at least at T11, but also potentially at L3, L4 and L5  ?-Patient is not accepting of hospice philosophy despite detailed discussions by her oncologist ?  ?History of GI bleed: ?-Continue pantoprazole ?  ?Essential hypertension: ?-Noted to have refused imdur ?-Continue Coreg 6.25 twice daily. ?  ?Aortic atherosclerosis: Noted on recent CT ?  ?Cystic mass of pancreas: ?-Previous CA 19-9 level normal. ?-Follow-up outpatient. ? ?Acute LLE DVT left posterior tibial vein 2/23 ?-Continues on heparin until all procedures are done ? ?  ?Poor overall prognosis, palliative care  IR and oncology involved ?Pain meds per palliative care's input- ?Await procedure and then likely can discharge possibly to either skilled facility and or get hired help according to Education officer, museum input ? ?Therapy is recommending home health PT rolling walker with 2 wheels and bedside commode ? ? ? ?Subjective:  ? ?Well ?Sitting in chair ?Slightly tired ?RLE pain  slight  improved with increased ROM ?No cp, no f,chills ?Ate well today ? ? ? ?Physical Exam: ?Vitals:  ? 07/13/21 2157 07/14/21 0134 07/14/21 0427 07/14/21 0500  ?BP: (!) 92/44 (!) 101/52 (!) 117/59   ?Pulse: 95 89 92   ?Resp: 18 16 16    ?Temp: 98.1 ?F (36.7 ?C) 98.5 ?F (36.9 ?C)    ?TempSrc: Oral Oral    ?SpO2: (!) 86% (!) 89% 100%   ?Weight:    61 kg  ?Height:      ? ? ?coherent no icterus no pallor mucosa slightly dry ?ROM intact moving 4 limbs reasonably ?No significant edema ?Abdomen soft with ostomy in place with liquid stool ?Slight distention to lower abdomen ?Neuro intact to power-reflexes not tested-limitation of power on right side secondary to pain but slight better ? ?Data Reviewed: ? ?Labs reviewed, Na 131 white count 21-->18.9  ?hemoglobin 7.1-->9.5 posttransfusion ? ?Family Communication: Discussed with the patient's son Slava at the bedside qd ? ?Disposition: ?Status is: Inpatient ?Remains inpatient appropriate because: Severity of illness ? ? Planned Discharge Destination:  SNF eventually when all work-up and procedures are done. ? ? ?Author: ?Nita Sells, MD ?07/14/2021 10:18 AM ? ?For on call review [email or web address].  ? ?

## 2021-07-14 NOTE — Progress Notes (Signed)
ANTICOAGULATION CONSULT NOTE   ? ?Pharmacy Consult for heparin  bridge therapy while apixaban on hold ?Indication: DVT ? ?Allergies  ?Allergen Reactions  ? Statins Nausea And Vomiting and Other (See Comments)  ?  MUSCLE PAIN  ? Amlodipine   ?  fatigue  ? Clonidine Derivatives   ?  headache  ? Losartan   ?  abd pain  ? Sulfa Antibiotics Nausea Only  ? Sulfamethoxazole-Trimethoprim Nausea Only  ?  " Severe Nausea "  ? ? ?Patient Measurements: ?Height: 5' (152.4 cm) ?Weight: 61 kg (134 lb 7.7 oz) ?IBW/kg (Calculated) : 45.5 ?Heparin dosing weight: 58 kg ? ? ?Vital Signs: ?Temp: 97.4 ?F (36.3 ?C) (03/05 2036) ?Temp Source: Oral (03/05 2036) ?BP: 102/50 (03/05 2036) ?Pulse Rate: 94 (03/05 2036) ? ?Labs: ?Recent Labs  ?  07/12/21 ?0543 07/12/21 ?1658 07/13/21 ?0530 07/13/21 ?1655 07/13/21 ?2247 07/14/21 ?0911 07/14/21 ?2000  ?HGB 7.1*  --  9.5*  --   --  9.1*  --   ?HCT 23.9*  --  31.4*  --   --  29.5*  --   ?PLT 317  --  320  --   --  299  --   ?APTT 102* 82*  --   --   --   --   --   ?LABPROT 14.7  --   --   --   --   --   --   ?INR 1.2  --   --   --   --   --   --   ?HEPARINUNFRC 0.61 0.52 0.25*   < > 0.20* 0.28* 0.41  ?CREATININE  --   --  0.55  --   --  0.42*  --   ? < > = values in this interval not displayed.  ? ? ? ?Estimated Creatinine Clearance: 48.8 mL/min (A) (by C-G formula based on SCr of 0.42 mg/dL (L)). ? ? ?Medical History: ?Past Medical History:  ?Diagnosis Date  ? Breast mass, left   ? Large  ? Cancer [facility])   ? FIBROMYOMA  AGE 89-   BENIGN  RESOLVED  ? GI bleed 05/03/2021  ? 1/23 continue with pantoprazole  ? Headache   ? Heart murmur   ? Hypertension   ? Mitral valve prolapse   ? Tuberculosis   ? AS CHILD  W/ TX  ? ? ?Medications:  ?No anticoagulants PTA  ? ?Assessment: ?76 y/o female with PMH of metastatic breast cancer who is post-op lap R colectomy, end ileostomy.  Experienced STEMI and cardiac arrest in PACU on 2/18. Started on Aspirin 81mg  daily and received 48 hours of IV heparin 2/19-2/21, then  transitioned to Enoxaparin 40mg  SQ q24h for VTE prophylaxis (last dose 2/24 at 1132). Bilateral lower extremity venous duplex today + for LLE DVT. Pharmacy consulted for Apixaban dosing on 07/04/21. ? ?On 07/10/21 pharmacy was consulted to dose heparin for bridge therapy while apixaban is on hold for planned IR procedure  ?Last dose apixaban 10 mg 2/28 @ 2208 ? ?07/14/21 ?Heparin level 0.41 units/mL, now therapeutic on heparin infusion at 1200 units/hr ?CBC: Hgb low but improved after transfusion. Pltc WNL.  ?No bleeding or infusion issues noted ? ?Goal of Therapy: ?Heparin level 0.3-0.7 units/mL ?Monitor platelets by anticoagulation protocol: Yes ? ?Plan:  ?Continue heparin infusion at 1200 units/hr ?Daily CBC, heparin level ?Monitor closely for s/sx of bleeding ?F/u timing of kyphoplasty & when IR wants to hold heparin infusion ? ? ?Netta Cedars, PharmD, BCPS ?Clinical  Pharmacist ?07/14/2021 11:40 PM ? ? ? ?

## 2021-07-14 NOTE — Progress Notes (Signed)
ANTICOAGULATION CONSULT NOTE   ? ?Pharmacy Consult for heparin  bridge therapy while apixaban on hold ?Indication: DVT ? ?Allergies  ?Allergen Reactions  ? Statins Nausea And Vomiting and Other (See Comments)  ?  MUSCLE PAIN  ? Amlodipine   ?  fatigue  ? Clonidine Derivatives   ?  headache  ? Losartan   ?  abd pain  ? Sulfa Antibiotics Nausea Only  ? Sulfamethoxazole-Trimethoprim Nausea Only  ?  " Severe Nausea "  ? ? ?Patient Measurements: ?Height: 5' (152.4 cm) ?Weight: 61 kg (134 lb 7.7 oz) ?IBW/kg (Calculated) : 45.5 ?Heparin dosing weight: 58 kg ? ? ?Vital Signs: ?Temp: 98.5 ?F (36.9 ?C) (03/05 0134) ?Temp Source: Oral (03/05 0134) ?BP: 117/59 (03/05 0427) ?Pulse Rate: 92 (03/05 0427) ? ?Labs: ?Recent Labs  ?  07/12/21 ?0543 07/12/21 ?1658 07/13/21 ?0530 07/13/21 ?1655 07/13/21 ?2247 07/14/21 ?0911  ?HGB 7.1*  --  9.5*  --   --  9.1*  ?HCT 23.9*  --  31.4*  --   --  29.5*  ?PLT 317  --  320  --   --  299  ?APTT 102* 82*  --   --   --   --   ?LABPROT 14.7  --   --   --   --   --   ?INR 1.2  --   --   --   --   --   ?HEPARINUNFRC 0.61 0.52 0.25* 0.30 0.20* 0.28*  ?CREATININE  --   --  0.55  --   --  0.42*  ? ? ? ?Estimated Creatinine Clearance: 48.8 mL/min (A) (by C-G formula based on SCr of 0.42 mg/dL (L)). ? ? ?Medical History: ?Past Medical History:  ?Diagnosis Date  ? Breast mass, left   ? Large  ? Cancer [facility])   ? FIBROMYOMA  AGE 19-   BENIGN  RESOLVED  ? GI bleed 05/03/2021  ? 1/23 continue with pantoprazole  ? Headache   ? Heart murmur   ? Hypertension   ? Mitral valve prolapse   ? Tuberculosis   ? AS CHILD  W/ TX  ? ? ?Medications:  ?No anticoagulants PTA  ? ?Assessment: ?76 y/o female with PMH of metastatic breast cancer who is post-op lap R colectomy, end ileostomy.  Experienced STEMI and cardiac arrest in PACU on 2/18. Started on Aspirin 81mg  daily and received 48 hours of IV heparin 2/19-2/21, then transitioned to Enoxaparin 40mg  SQ q24h for VTE prophylaxis (last dose 2/24 at 1132). Bilateral lower  extremity venous duplex today + for LLE DVT. Pharmacy consulted for Apixaban dosing on 07/04/21. ? ?On 07/10/21 pharmacy was consulted to dose heparin for bridge therapy while apixaban is on hold for planned IR procedure  ?Last dose apixaban 10 mg 2/28 @ 2208 ? ?07/14/21 ?Heparin level 0.28 units/mL, remains subtherapeutic on heparin infusion at 1100 units/hr ?3/4 CBC: Hgb low but improved to 9.5 after transfusion. Pltc WNL.  ?No IV site or infusion issues noted ? ?Goal of Therapy: ?Heparin level 0.3-0.7 units/mL ?Monitor platelets by anticoagulation protocol: Yes ? ?Plan:  ?Give 900 unit heparin bolus then increase heparin infusion to 1200 units/hr ?Heparin level 8 hours after rate change ?Daily CBC, heparin level ?Monitor closely for s/sx of bleeding ?F/u timing of kyphoplasty & when IR wants to hold heparin infusion ? ? ?Tawnya Crook, PharmD, BCPS ?Clinical Pharmacist ?07/14/2021 10:15 AM ? ? ? ?

## 2021-07-15 ENCOUNTER — Encounter (HOSPITAL_COMMUNITY): Payer: Self-pay

## 2021-07-15 DIAGNOSIS — K561 Intussusception: Secondary | ICD-10-CM | POA: Diagnosis not present

## 2021-07-15 LAB — COMPREHENSIVE METABOLIC PANEL
ALT: 13 U/L (ref 0–44)
AST: 25 U/L (ref 15–41)
Albumin: 1.7 g/dL — ABNORMAL LOW (ref 3.5–5.0)
Alkaline Phosphatase: 241 U/L — ABNORMAL HIGH (ref 38–126)
Anion gap: 8 (ref 5–15)
BUN: 15 mg/dL (ref 8–23)
CO2: 25 mmol/L (ref 22–32)
Calcium: 8.9 mg/dL (ref 8.9–10.3)
Chloride: 100 mmol/L (ref 98–111)
Creatinine, Ser: 0.45 mg/dL (ref 0.44–1.00)
GFR, Estimated: >60 mL/min (ref >60)
Glucose, Bld: 129 mg/dL — ABNORMAL HIGH (ref 70–99)
Potassium: 3.7 mmol/L (ref 3.5–5.1)
Sodium: 133 mmol/L — ABNORMAL LOW (ref 135–145)
Total Bilirubin: 0.3 mg/dL (ref 0.3–1.2)
Total Protein: 5.2 g/dL — ABNORMAL LOW (ref 6.5–8.1)

## 2021-07-15 LAB — CBC WITH DIFFERENTIAL/PLATELET
Abs Immature Granulocytes: 0.7 10*3/uL — ABNORMAL HIGH (ref 0.00–0.07)
Basophils Absolute: 0.1 10*3/uL (ref 0.0–0.1)
Basophils Relative: 0 %
Eosinophils Absolute: 0.2 10*3/uL (ref 0.0–0.5)
Eosinophils Relative: 1 %
HCT: 27.1 % — ABNORMAL LOW (ref 36.0–46.0)
Hemoglobin: 8.3 g/dL — ABNORMAL LOW (ref 12.0–15.0)
Immature Granulocytes: 4 %
Lymphocytes Relative: 3 %
Lymphs Abs: 0.6 10*3/uL — ABNORMAL LOW (ref 0.7–4.0)
MCH: 28 pg (ref 26.0–34.0)
MCHC: 30.6 g/dL (ref 30.0–36.0)
MCV: 91.6 fL (ref 80.0–100.0)
Monocytes Absolute: 0.9 10*3/uL (ref 0.1–1.0)
Monocytes Relative: 4 %
Neutro Abs: 17.7 10*3/uL — ABNORMAL HIGH (ref 1.7–7.7)
Neutrophils Relative %: 88 %
Platelets: 304 10*3/uL (ref 150–400)
RBC: 2.96 MIL/uL — ABNORMAL LOW (ref 3.87–5.11)
RDW: 23.5 % — ABNORMAL HIGH (ref 11.5–15.5)
WBC: 20.2 10*3/uL — ABNORMAL HIGH (ref 4.0–10.5)
nRBC: 0 % (ref 0.0–0.2)

## 2021-07-15 LAB — BPAM RBC
Blood Product Expiration Date: 202303172359
ISSUE DATE / TIME: 202303031153
Unit Type and Rh: 7300

## 2021-07-15 LAB — TYPE AND SCREEN
ABO/RH(D): B POS
Antibody Screen: NEGATIVE
Unit division: 0

## 2021-07-15 LAB — HEPARIN LEVEL (UNFRACTIONATED): Heparin Unfractionated: 0.43 IU/mL (ref 0.30–0.70)

## 2021-07-15 MED ORDER — CARVEDILOL 3.125 MG PO TABS
3.1250 mg | ORAL_TABLET | Freq: Once | ORAL | Status: AC
  Administered 2021-07-15: 3.125 mg via ORAL
  Filled 2021-07-15: qty 1

## 2021-07-15 MED ORDER — CARVEDILOL 3.125 MG PO TABS
3.1250 mg | ORAL_TABLET | Freq: Two times a day (BID) | ORAL | Status: DC
  Administered 2021-07-15 – 2021-07-24 (×14): 3.125 mg via ORAL
  Filled 2021-07-15 (×18): qty 1

## 2021-07-15 MED ORDER — SODIUM CHLORIDE 0.9 % IV SOLN
3.0000 g | Freq: Four times a day (QID) | INTRAVENOUS | Status: DC
  Administered 2021-07-15 – 2021-07-17 (×10): 3 g via INTRAVENOUS
  Filled 2021-07-15 (×11): qty 8

## 2021-07-15 NOTE — Progress Notes (Signed)
Pt was assisted out of bed to bedside commode.  Pt was able to stand and pivot to the bedside commode but showed greater weakness returning to the bed.  Pt was a heavy 2 + assist.   ?

## 2021-07-15 NOTE — Progress Notes (Signed)
Patient ID: Robin Estrada, female   DOB: 1946-03-06, 76 y.o.   MRN: [id number] ?[facility] ?Progress Note ? ?16 Days Post-Op  ?Subjective: ?CC-  ?Comfortable this morning. No complaints except some back pain.  Unclear how much she is eating.  I/Os show stability in intake around 400cc, but unclear how much solid food she is eating. She has several things on her bedside table, but only partially gone ? ?Objective: ?Vital signs in last 24 hours: ?Temp:  [97.4 ?F (36.3 ?C)-97.5 ?F (36.4 ?C)] 97.5 ?F (36.4 ?C) (03/06 0444) ?Pulse Rate:  [94-103] 103 (03/06 0444) ?Resp:  [14-24] 20 (03/06 0444) ?BP: (102-161)/(50-65) 126/65 (03/06 0444) ?SpO2:  [92 %-95 %] 92 % (03/06 0444) ?Weight:  [61.2 kg] 61.2 kg (03/06 0446) ?Last BM Date : 07/14/21 ? ?Intake/Output from previous day: ?03/05 0701 - 03/06 0700 ?In: 918.1 [P.O.:460; I.V.:457.8; IV Piggyback:0.3] ?Out: 175 [Stool:175] ?Intake/Output this shift: ?No intake/output data recorded. ? ?PE: ?Gen:  Alert, NAD ?Pulm: rate and effort normal ?Abd: Soft, NT/ND, ostomy pink and budded with air and small amount soft brown stool in pouch ? ?Lab Results:  ?Recent Labs  ?  07/14/21 ?0911 07/15/21 ?0500  ?WBC 18.9* 20.2*  ?HGB 9.1* 8.3*  ?HCT 29.5* 27.1*  ?PLT 299 304  ? ?BMET ?Recent Labs  ?  07/14/21 ?0911 07/15/21 ?0500  ?NA 131* 133*  ?K 4.0 3.7  ?CL 99 100  ?CO2 24 25  ?GLUCOSE 139* 129*  ?BUN 12 15  ?CREATININE 0.42* 0.45  ?CALCIUM 8.7* 8.9  ? ?PT/INR ?No results for input(s): LABPROT, INR in the last 72 hours. ? ?CMP  ?   ?Component Value Date/Time  ? NA 133 (L) 07/15/2021 0500  ? K 3.7 07/15/2021 0500  ? CL 100 07/15/2021 0500  ? CO2 25 07/15/2021 0500  ? GLUCOSE 129 (H) 07/15/2021 0500  ? BUN 15 07/15/2021 0500  ? CREATININE 0.45 07/15/2021 0500  ? CREATININE 0.86 11/09/2017 0831  ? CALCIUM 8.9 07/15/2021 0500  ? PROT 5.2 (L) 07/15/2021 0500  ? ALBUMIN 1.7 (L) 07/15/2021 0500  ? AST 25 07/15/2021 0500  ? AST 23 11/09/2017 0831  ? ALT 13 07/15/2021 0500  ? ALT 34  11/09/2017 0831  ? ALKPHOS 241 (H) 07/15/2021 0500  ? BILITOT 0.3 07/15/2021 0500  ? BILITOT 0.4 11/09/2017 0831  ? GFRNONAA >60 07/15/2021 0500  ? GFRNONAA >60 11/09/2017 0831  ? GFRAA >60 04/28/2018 1439  ? GFRAA >60 11/09/2017 0831  ? ?Lipase  ?   ?Component Value Date/Time  ? LIPASE 27 06/28/2021 1644  ? ? ? ? ? ?Studies/Results: ?DG Chest 2 View ? ?Result Date: 07/14/2021 ?CLINICAL DATA:  Pneumonia. EXAM: CHEST - 2 VIEW COMPARISON:  06/29/2021 and older studies. FINDINGS: There is new opacity at the right lung base obscuring the hemidiaphragm, which may in part be due to elevation of the right hemidiaphragm, associated with a small pleural effusion. Right mid lung mass is poorly defined. Left lung is clear.  No left pleural effusion. No pneumothorax. No residual support apparatus. IMPRESSION: 1. New opacity has developed at the right lung base consistent with a combination of pleural fluid and probable atelectasis, with pneumonia possible, along with elevation of the right hemidiaphragm. Right lung mass, best appreciated on the CT dated 05/03/2021, is not well-defined on the frontal view, better appreciated on the lateral view, without convincing change. Electronically Signed   By: Lajean Manes M.D.   On: 07/14/2021 10:34   ? ?Anti-infectives: ?Anti-infectives (From  admission, onward)  ? ? Start     Dose/Rate Route Frequency Ordered Stop  ? 07/15/21 0615  Ampicillin-Sulbactam (UNASYN) 3 g in sodium chloride 0.9 % 100 mL IVPB       ? 3 g ?200 mL/hr over 30 Minutes Intravenous Every 6 hours 07/15/21 0608    ? 06/28/21 2200  piperacillin-tazobactam (ZOSYN) IVPB 3.375 g  Status:  Discontinued       ? 3.375 g ?12.5 mL/hr over 240 Minutes Intravenous Every 8 hours 06/28/21 2120 07/04/21 0841  ? ?  ? ? ? ?Assessment/Plan ?POD#16, S/P laparoscopic assisted partial R colectomy, end ileostomy 06/29/21 Dr. Marcello Moores for intussusception of cecum ?- Path consistent with more metastatic disease, patient has known lung and sigmoid  colon mets. Poor prognosis but she is not ready for hospital. Palliative and oncology have seen, working on dispo: SNF vs home with [facility] ?- Abdominal exam is benign. Tolerating diet and ileostomy working. No indication for any further surgery. Encourage PO intake. ?-awaiting kypho and then placement ?-WBC stable around 20K.  Abdominal exam is benign with no concerning features on last scan or on exam ?  ?FEN: Regular diet ?ID: Zosyn stopped 2/22 ?VTE: SCD's, hep gtt  ?Foley: out   ?  ?Cardiac arrest, inferior STEMI  ?Metastatic breast cancer  ?PMH GIB ?HTN ?Acute on chronic anemia  ?LLE DVT - on [place] 2/23 ? ? ? LOS: 17 days  ? ? ?Henreitta Cea, PA-C ?[facility] ?07/15/2021, 8:15 AM ?Please see Amion for pager number during day hours 7:00am-4:30pm ? ?

## 2021-07-15 NOTE — Progress Notes (Signed)
PHARMACY NOTE -  Unasyn ? ?Pharmacy has been assisting with dosing of Unasyn for aspiration PNA. ?Dosage remains stable at 3g IV q6h and further renal adjustments per institutional Pharmacy antibiotic protocol ? ?Pharmacy will sign off, following peripherally for culture results or dose adjustments. Please reconsult if a change in clinical status warrants re-evaluation of dosage. ? ?Reuel Boom, PharmD, BCPS ?[phone] ?07/15/2021, 6:09 AM ? ? ? ?

## 2021-07-15 NOTE — Progress Notes (Signed)
? ? ?Progress Note ? ? ?PatientShineka Estrada LZJ:[id number] DOB: 01-Oct-1945 DOA: 06/28/2021     17 ?DOS: the patient was seen and examined on 07/15/2021 ?  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  ?CT abdomen with IV contrast showed large intussusception of the cecum and distal ileum within the length of the ascending colon and subsequent dilatation of the ascending colon.  ? ?General surgery was consulted.  She underwent laparoscopic right colectomy with end ileostomy on 06/29/2021. ?She had a post-op cardiac arrest and inferior STEMI-- Cardiology consulted, Not a candidate for cardiac intervention.  ?Currently we await insurance authorization for intercoastal procedure/kyphoplasty for nonpharmacological relief of her severe metastatic bone pain-earliest this will be performed is on 07/16/2021 according to family who have spoken with IR ? ?Assessment and Plan: ? ?Large Cecal and distal ileum intussusception with bowel obstruction: ?-Patient presented with severe abdominal pain associated with nausea. ?-S/p laparoscopic right colectomy with end ileostomy 2/18 ?-Completed course of zosyn ?-Appreciate general surgery , ileostomy working. ?-Per CCS, leukocytosis likely not from intra-abdominal source---  ?-CT repeat 3/1 shows small area of intussusception -- she does have metastatic disease ?-Continue to monitor white count-poor recurrent surg candidate ? ?Hypoxia ? 2/2 dilaudid IV and "splinting" of breaths? ?-demonstrated use of IS, 2/2  ?-2 vw CXR showed possible basilar PNA vs Atelectasis ?-Unasyn started 07/15/21 given relatively immunocompromised state would continue the same and repeat x-ray in 24 to 48 hours after she is able to use I-S ?-I do not know if she can get the procedure if she has an active infection and  will defer decision making to IR ? ?Acute blood loss anemia ?-Likely postoperative and dilutional component--no reports of dark stool ?-Transfuse 1 unit blood 3/3 with acceptable rise ?-IV iron given secondary to low iron sats on 3/4 ? ?Inferior STEMI / Postop cardiac arrest ?Ischemic cardiomyopathy; ?-Troponin peaked to 24,000.   ?-TTE showed LV ejection fraction of 50%, the left ventricle demonstrated RWMA.  Mild LVH,  diastolic parameters indeterminate. ?-On heparin gtt.-when all procedures performed continue Eliquis, no aspirin ?-Pt refusing statin, zetia, imdur ?-Cardiology feels that she is a poor cath candidate and have signed off ?  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  ?Pathologic fracture deformity at the level of T11: ?-Status post left total mastectomy in 2019.   ?-Patient underwent CT-guided lung biopsy on 05/14/2021- results consistent of poorly differentiated malignancy with sarcomatoid and epithelioid features.  ?-first choice pain oxy--2nd choice IV dilaudid cautiously ?-Await insurances --proceed with osteocool and kyphoplasty at least at T11, but also potentially at L3, L4 and L5  ?-Patient is not accepting of hospice philosophy despite detailed discussions by her oncologist ?  ?History of GI bleed: ?-Continue pantoprazole ?  ?Essential hypertension: ?-Noted to have refused imdur ?-Continue Coreg 6.25 twice daily. ?  ?Aortic atherosclerosis: Noted on recent CT ?  ?Cystic mass of pancreas: ?-Previous CA 19-9 level normal. ?-Follow-up outpatient. ?-Continues on heparin until all procedures are done ? ?  ?Poor overall prognosis, palliative care  IR and oncology involved ?Pain meds per palliative care's input- ?Good discussion with the patient's  son on 3/6 with regards to possibility of pneumonia and discussion about goals of care-he is hopeful for recovery while understanding that if she has pneumonia this complicates the picture and understands that we may need to have different discussions if she fails to thrive-I went over the concept of adult failure to thrive with him in her case as well and he understands ?Await procedure ? skilled facility and or get hired help according to Education officer, museum input ? ?Therapy is recommending home health PT rolling walker with 2 wheels and bedside commode ? ? ? ?Subjective:  ? ? no distress awake alert ?Soft speech ?Affect is flat ?She feels warm to touch ?No chest pain no fever no overt coughing ?Son reports ate well recently ? ? ? ?Physical Exam: ?Vitals:  ? 07/15/21 0444 07/15/21 0446 07/15/21 0848 07/15/21 1240  ?BP: 126/65  (!) 106/48 (!) 119/57  ?Pulse: (!) 103  (!) 109 (!) 112  ?Resp: 20   16  ?Temp: (!) 97.5 ?F (36.4 ?C)   98.4 ?F (36.9 ?C)  ?TempSrc: Oral   Oral  ?SpO2: 92%   92%  ?Weight:  61.2 kg    ?Height:      ? ? ?Slightly sleepy but awakens readily ?EOMI NCAT no focal deficit moderate dentition no thrush ?Neck soft supple ?S1-S2 no murmur ?Abdomen with ostomy ?ROM intact slightly decreased pain on movement of lower extremities ?She does have a small sacral decubitus and area of pressure injury in her buttocks ? ?Data Reviewed: ? ?Labs reviewed, Na 131 white count 21-->18.9  ?hemoglobin 7.1-->9.5 posttransfusion ? ?Family Communication: Discussed with the patient's son Slava at the bedside qd ? ?Disposition: ?Status is: Inpatient ?Remains inpatient appropriate because: Severity of illness ? ? Planned Discharge Destination:  SNF eventually when all work-up and procedures are done. ? ? ?Author: ?Nita Sells, MD ?07/15/2021 1:31 PM ? ?For on call review [email or web address]  ? ?

## 2021-07-15 NOTE — Progress Notes (Signed)
PT Cancellation Note ? ?Patient Details ?Name: Robin Estrada ?MRN: [id number] ?DOB: 01/10/1946 ? ? ?Cancelled Treatment:    Reason Eval/Treat Not Completed: Pain limiting ability to participate (Pt reporting increased pain in R LE following transfers to/from [facility] with nursing staff. Pt stating that she knows she needs to be up moving with walker but requesting to "try tomorrow" due to pain. PT will follow up as schedule allows.) ? ?Festus Barren., PT, DPT  ?Acute Rehabilitation Services  ?Office [phone] ? ?07/15/2021, 12:32 PM ?

## 2021-07-15 NOTE — Progress Notes (Signed)
Nurse Tech Marilynn Rail notified me that patient was in the yellow mews. Patient is alert and oriented x4 and appears to be in no distress at this time. Yellow MEWS protocol initiated and will continue the yellow MEWS protocol.  ? ? ? 07/15/21 1240  ?Assess: MEWS Score  ?Temp 98.4 ?F (36.9 ?C)  ?BP (!) 119/57  ?Pulse Rate (!) 112  ?Resp 16  ?SpO2 92 %  ?Assess: MEWS Score  ?MEWS Temp 0  ?MEWS Systolic 0  ?MEWS Pulse 2  ?MEWS RR 0  ?MEWS LOC 0  ?MEWS Score 2  ?MEWS Score Color Yellow  ?Assess: if the MEWS score is Yellow or Red  ?Were vital signs taken at a resting state? Yes  ?Focused Assessment No change from prior assessment  ?Does the patient meet 2 or more of the SIRS criteria? No  ?MEWS guidelines implemented *See Row Information* Yes  ?Treat  ?MEWS Interventions Other (Comment)  ?Pain Scale 0-10  ?Pain Score 0  ?Take Vital Signs  ?Increase Vital Sign Frequency  Yellow: Q 2hr X 2 then Q 4hr X 2, if remains yellow, continue Q 4hrs  ?Escalate  ?MEWS: Escalate Yellow: discuss with charge nurse/RN and consider discussing with provider and RRT  ?Notify: Charge Nurse/RN  ?Name of Charge Nurse/RN Notified Harvest Dark, RN  ?Date Charge Nurse/RN Notified 07/15/21  ?Time Charge Nurse/RN Notified 1240  ?Document  ?Patient Outcome Other (Comment) ?(remains on unit)  ?Progress note created (see row info) Yes  ?Assess: SIRS CRITERIA  ?SIRS Temperature  0  ?SIRS Pulse 1  ?SIRS Respirations  0  ?SIRS WBC 0  ?SIRS Score Sum  1  ? ? ?

## 2021-07-15 NOTE — Evaluation (Signed)
Clinical/Bedside Swallow Evaluation ?Patient Details  ?Name: Robin Estrada ?MRN: [id number] ?Date of Birth: 1945/07/16 ? ?Today's Date: 07/15/2021 ?Time: SLP Start Time (ACUTE ONLY): 4235 SLP Stop Time (ACUTE ONLY): 1520 ?SLP Time Calculation (min) (ACUTE ONLY): 15 min ? ?Past Medical History:  ?Past Medical History:  ?Diagnosis Date  ? Breast mass, left   ? Large  ? Cancer [facility])   ? FIBROMYOMA  AGE 76-   BENIGN  RESOLVED  ? GI bleed 05/03/2021  ? 1/23 continue with pantoprazole  ? Headache   ? Heart murmur   ? Hypertension   ? Mitral valve prolapse   ? Tuberculosis   ? AS CHILD  W/ TX  ? ?Past Surgical History:  ?Past Surgical History:  ?Procedure Laterality Date  ? BIOPSY  05/04/2021  ? Procedure: BIOPSY;  Surgeon: Irving Copas., MD;  Location: [place];  Service: Gastroenterology;;  ? BRONCHIAL BIOPSY  05/07/2021  ? Procedure: BRONCHIAL BIOPSIES;  Surgeon: Margaretha Seeds, MD;  Location: [facility] ENDOSCOPY;  Service: Pulmonary;;  ? BRONCHIAL BRUSHINGS  05/07/2021  ? Procedure: BRONCHIAL BRUSHINGS;  Surgeon: Margaretha Seeds, MD;  Location: [place];  Service: Pulmonary;;  ? BRONCHIAL NEEDLE ASPIRATION BIOPSY  05/07/2021  ? Procedure: BRONCHIAL NEEDLE ASPIRATION BIOPSIES;  Surgeon: Margaretha Seeds, MD;  Location: [place];  Service: Pulmonary;;  ? BRONCHIAL WASHINGS  05/07/2021  ? Procedure: BRONCHIAL WASHINGS;  Surgeon: Margaretha Seeds, MD;  Location: [facility] ENDOSCOPY;  Service: Pulmonary;;  ? COLONOSCOPY WITH PROPOFOL N/A 05/09/2021  ? Procedure: COLONOSCOPY WITH PROPOFOL;  Surgeon: Ladene Artist, MD;  Location: [facility] ENDOSCOPY;  Service: Endoscopy;  Laterality: N/A;  ? ESOPHAGOGASTRODUODENOSCOPY N/A 05/04/2021  ? Procedure: ESOPHAGOGASTRODUODENOSCOPY (EGD);  Surgeon: Irving Copas., MD;  Location: [place];  Service: Gastroenterology;  Laterality: N/A;  ? HEMOSTASIS CONTROL  05/07/2021  ? Procedure: HEMOSTASIS CONTROL;  Surgeon: Margaretha Seeds, MD;  Location: [place];   Service: Pulmonary;;  ? LAPAROSCOPIC PARTIAL RIGHT COLECTOMY N/A 06/29/2021  ? Procedure: LAPAROSCOPIC PARTIAL RIGHT COLECTOMY WITH END ILEOSTOMY;  Surgeon: Leighton Ruff, MD;  Location: [facility];  Service: General;  Laterality: N/A;  ? MASS EXCISION Left 08/14/2017  ? Procedure: PARTIAL EXCISION  LEFT BREAST  MASS ERAS PATHWAY;  Surgeon: Fanny Skates, MD;  Location: [place];  Service: General;  Laterality: Left;  ? MASTECTOMY W/ SENTINEL NODE BIOPSY Left 09/14/2017  ? Procedure: LEFT TOTAL MASTECTOMY WITH SENTINEL LYMPH NODE BIOPSY;  Surgeon: Fanny Skates, MD;  Location: [place];  Service: General;  Laterality: Left;  ? NO PAST SURGERIES    ? POLYPECTOMY  05/09/2021  ? Procedure: POLYPECTOMY;  Surgeon: Ladene Artist, MD;  Location: [facility] ENDOSCOPY;  Service: Endoscopy;;  ? SUBMUCOSAL TATTOO INJECTION  05/09/2021  ? Procedure: SUBMUCOSAL TATTOO INJECTION;  Surgeon: Ladene Artist, MD;  Location: [place];  Service: Endoscopy;;  ? VIDEO BRONCHOSCOPY WITH ENDOBRONCHIAL ULTRASOUND N/A 05/07/2021  ? Procedure: VIDEO BRONCHOSCOPY WITH ENDOBRONCHIAL ULTRASOUND;  Surgeon: Margaretha Seeds, MD;  Location: [place];  Service: Pulmonary;  Laterality: N/A;  ? ?HPI:  ?Patient is a 76 y.o. female with PMH: lung cancer, breast cancer, new vertebral masses who presented to the ER with several day h/o worsening abdominal pain and nausea. Patient was taking herself off her opiates because she did not like how they were making her feel; son reportedly thinking her abdominal pain was related to possible opiate withdrawl. In ER, CT abdomen showed large intussusception of the cecum and distal ileum within the length  of the ascending colon and subsequent dilatation of the ascending colon and general surgery was consulted.  CXR showed new opacity that has developed at the right lung base with a combination of pleural fluid and probable atelectasis with PNA possible.  ?  ?Assessment / Plan / Recommendation  ?Clinical Impression ?  Patient not presenting with clinical s/s of dysphagia as per th is bedside/clinical swallow evaluation. Patient did exhibit mildly prolonged mastication and oral transit with solids but this appears secondary to generalized weakness and fatigue as opposed to a true oral phase dysphagia. Patient and son both deny any h/o difficulty with swallowing. Son did report that today is the first time in a while that patient has been eating a good amount of food as she has eaten hardly anything since abdominal pain started about 3 weeks ago. SLP did not observe any coughing, throat clearing or other overt s/s of dysphagia. No further skilled SLP intervention recommended at this time. ?SLP Visit Diagnosis: Dysphagia, unspecified (R13.10) ?   ?Aspiration Risk ? No limitations;Mild aspiration risk  ?  ?Diet Recommendation Regular;Thin liquid  ? ?Liquid Administration via: Straw;Cup ?Medication Administration: Whole meds with liquid ?Postural Changes: Seated upright at 90 degrees  ?  ?Other  Recommendations Oral Care Recommendations: Oral care BID   ? ?Recommendations for follow up therapy are one component of a multi-disciplinary discharge planning process, led by the attending physician.  Recommendations may be updated based on patient status, additional functional criteria and insurance authorization. ? ?Follow up Recommendations No SLP follow up  ? ? ?  ?Assistance Recommended at Discharge None  ?Functional Status Assessment Patient has had a recent decline in their functional status and demonstrates the ability to make significant improvements in function in a reasonable and predictable amount of time.  ?Frequency and Duration    ? N/A ?  ?   ? ?Prognosis   N/A ? ?  ? ?Swallow Study   ?General Date of Onset: 07/15/21 ?HPI: Patient is a 76 y.o. female with PMH: lung cancer, breast cancer, new vertebral masses who presented to the ER with several day h/o worsening abdominal pain and nausea. Patient was taking herself off her  opiates because she did not like how they were making her feel; son reportedly thinking her abdominal pain was related to possible opiate withdrawl. In ER, CT abdomen showed large intussusception of the cecum and distal ileum within the length of the ascending colon and subsequent dilatation of the ascending colon and general surgery was consulted.  CXR showed new opacity that has developed at the right lung base with a combination of pleural fluid and probable atelectasis with PNA possible. ?Type of Study: Bedside Swallow Evaluation ?Previous Swallow Assessment: none found ?Diet Prior to this Study: Regular;Thin liquids ?Temperature Spikes Noted: No ?Respiratory Status: Room air ?History of Recent Intubation: No ?Behavior/Cognition: Cooperative;Pleasant mood;Alert ?Oral Cavity Assessment: Within Functional Limits ?Oral Care Completed by SLP: No ?Oral Cavity - Dentition: Adequate natural dentition ?Vision: Functional for self-feeding ?Self-Feeding Abilities: Able to feed self;Needs assist ?Patient Positioning: Upright in bed ?Baseline Vocal Quality: Low vocal intensity ?Volitional Swallow: Able to elicit  ?  ?Oral/Motor/Sensory Function Overall Oral Motor/Sensory Function: Within functional limits   ?Ice Chips     ?Thin Liquid Thin Liquid: Within functional limits ?Presentation: Straw  ?  ?Nectar Thick   NT  ?Honey Thick   NT  ?Puree Puree: Not tested   ?Solid ? ? ?  Solid: Impaired ?Oral Phase Functional Implications: Prolonged oral  transit;Impaired mastication ?Pharyngeal Phase Impairments: Other (comments) (no overt s/s of pharyngeal phase dysphagia)  ? ?  ? ?Sonia Baller, MA, CCC-SLP ?Speech Therapy ? ? ? ? ?

## 2021-07-15 NOTE — Progress Notes (Signed)
ANTICOAGULATION CONSULT NOTE   ? ?Pharmacy Consult for heparin  bridge therapy while apixaban on hold ?Indication: DVT ? ?Allergies  ?Allergen Reactions  ? Statins Nausea And Vomiting and Other (See Comments)  ?  MUSCLE PAIN  ? Amlodipine   ?  fatigue  ? Clonidine Derivatives   ?  headache  ? Losartan   ?  abd pain  ? Sulfa Antibiotics Nausea Only  ? Sulfamethoxazole-Trimethoprim Nausea Only  ?  " Severe Nausea "  ? ? ?Patient Measurements: ?Height: 5' (152.4 cm) ?Weight: 61.2 kg (134 lb 14.7 oz) ?IBW/kg (Calculated) : 45.5 ?Heparin dosing weight: 58 kg ? ? ?Vital Signs: ?Temp: 97.5 ?F (36.4 ?C) (03/06 0444) ?Temp Source: Oral (03/06 0444) ?BP: 126/65 (03/06 0444) ?Pulse Rate: 103 (03/06 0444) ? ?Labs: ?Recent Labs  ?  07/12/21 ?1658 07/12/21 ?1658 07/13/21 ?0530 07/13/21 ?1655 07/14/21 ?0911 07/14/21 ?2000 07/15/21 ?0500  ?HGB  --    < > 9.5*  --  9.1*  --  8.3*  ?HCT  --   --  31.4*  --  29.5*  --  27.1*  ?PLT  --   --  320  --  299  --  304  ?APTT 82*  --   --   --   --   --   --   ?HEPARINUNFRC 0.52  --  0.25*   < > 0.28* 0.41 0.43  ?CREATININE  --   --  0.55  --  0.42*  --  0.45  ? < > = values in this interval not displayed.  ? ? ? ?Estimated Creatinine Clearance: 48.9 mL/min (by C-G formula based on SCr of 0.45 mg/dL). ? ? ?Medical History: ?Past Medical History:  ?Diagnosis Date  ? Breast mass, left   ? Large  ? Cancer [facility])   ? FIBROMYOMA  AGE 22-   BENIGN  RESOLVED  ? GI bleed 05/03/2021  ? 1/23 continue with pantoprazole  ? Headache   ? Heart murmur   ? Hypertension   ? Mitral valve prolapse   ? Tuberculosis   ? AS CHILD  W/ TX  ? ? ?Medications:  ?No anticoagulants PTA  ? ?Assessment: ?76 y/o female with PMH of metastatic breast cancer who is post-op lap R colectomy, end ileostomy.  Experienced STEMI and cardiac arrest in PACU on 2/18. Started on Aspirin 81mg  daily and received 48 hours of IV heparin 2/19-2/21, then transitioned to Enoxaparin 40mg  SQ q24h for VTE prophylaxis (last dose 2/24 at 1132).  Bilateral lower extremity venous duplex today + for LLE DVT. Pharmacy consulted for Apixaban dosing on 07/04/21. ? ?On 07/10/21 pharmacy was consulted to dose heparin for bridge therapy while apixaban is on hold for planned IR procedure  ?Last dose apixaban 10 mg 2/28 @ 2208 ? ?07/15/21 ?Heparin level 0.43 units/mL, remains therapeutic on heparin infusion at 1200 units/hr ?CBC: Hgb 8.3-low & trending back down. Pltc WNL.  ?No bleeding or infusion issues noted by RN ? ?Goal of Therapy: ?Heparin level 0.3-0.7 units/mL ?Monitor platelets by anticoagulation protocol: Yes ? ?Plan:  ?Continue heparin infusion at 1200 units/hr ?Daily CBC, heparin level ?Monitor closely for s/sx of bleeding ?F/u timing of kyphoplasty & when IR wants to hold heparin infusion ? ? ?Netta Cedars, PharmD, BCPS ?Clinical Pharmacist ?07/15/2021 6:10 AM ? ? ? ?

## 2021-07-16 DIAGNOSIS — K561 Intussusception: Secondary | ICD-10-CM | POA: Diagnosis not present

## 2021-07-16 LAB — CBC WITH DIFFERENTIAL/PLATELET
Abs Immature Granulocytes: 1.19 10*3/uL — ABNORMAL HIGH (ref 0.00–0.07)
Basophils Absolute: 0.1 10*3/uL (ref 0.0–0.1)
Basophils Relative: 1 %
Eosinophils Absolute: 0.4 10*3/uL (ref 0.0–0.5)
Eosinophils Relative: 2 %
HCT: 27 % — ABNORMAL LOW (ref 36.0–46.0)
Hemoglobin: 8.1 g/dL — ABNORMAL LOW (ref 12.0–15.0)
Immature Granulocytes: 6 %
Lymphocytes Relative: 3 %
Lymphs Abs: 0.6 10*3/uL — ABNORMAL LOW (ref 0.7–4.0)
MCH: 28 pg (ref 26.0–34.0)
MCHC: 30 g/dL (ref 30.0–36.0)
MCV: 93.4 fL (ref 80.0–100.0)
Monocytes Absolute: 0.9 10*3/uL (ref 0.1–1.0)
Monocytes Relative: 4 %
Neutro Abs: 18.3 10*3/uL — ABNORMAL HIGH (ref 1.7–7.7)
Neutrophils Relative %: 84 %
Platelets: 308 10*3/uL (ref 150–400)
RBC: 2.89 MIL/uL — ABNORMAL LOW (ref 3.87–5.11)
RDW: 23.2 % — ABNORMAL HIGH (ref 11.5–15.5)
WBC: 21.5 10*3/uL — ABNORMAL HIGH (ref 4.0–10.5)
nRBC: 0 % (ref 0.0–0.2)

## 2021-07-16 LAB — COMPREHENSIVE METABOLIC PANEL
ALT: 15 U/L (ref 0–44)
AST: 29 U/L (ref 15–41)
Albumin: 1.6 g/dL — ABNORMAL LOW (ref 3.5–5.0)
Alkaline Phosphatase: 308 U/L — ABNORMAL HIGH (ref 38–126)
Anion gap: 8 (ref 5–15)
BUN: 10 mg/dL (ref 8–23)
CO2: 24 mmol/L (ref 22–32)
Calcium: 8.9 mg/dL (ref 8.9–10.3)
Chloride: 99 mmol/L (ref 98–111)
Creatinine, Ser: 0.43 mg/dL — ABNORMAL LOW (ref 0.44–1.00)
GFR, Estimated: >60 mL/min (ref >60)
Glucose, Bld: 120 mg/dL — ABNORMAL HIGH (ref 70–99)
Potassium: 3.5 mmol/L (ref 3.5–5.1)
Sodium: 131 mmol/L — ABNORMAL LOW (ref 135–145)
Total Bilirubin: 0.2 mg/dL — ABNORMAL LOW (ref 0.3–1.2)
Total Protein: 4.9 g/dL — ABNORMAL LOW (ref 6.5–8.1)

## 2021-07-16 LAB — HEPARIN LEVEL (UNFRACTIONATED): Heparin Unfractionated: 0.3 IU/mL (ref 0.30–0.70)

## 2021-07-16 NOTE — Progress Notes (Signed)
ANTICOAGULATION CONSULT NOTE   ? ?Pharmacy Consult for heparin  bridge therapy while apixaban on hold ?Indication: DVT ? ?Allergies  ?Allergen Reactions  ? Statins Nausea And Vomiting and Other (See Comments)  ?  MUSCLE PAIN  ? Amlodipine   ?  fatigue  ? Clonidine Derivatives   ?  headache  ? Losartan   ?  abd pain  ? Sulfa Antibiotics Nausea Only  ? Sulfamethoxazole-Trimethoprim Nausea Only  ?  " Severe Nausea "  ? ? ?Patient Measurements: ?Height: 5' (152.4 cm) ?Weight: 61.2 kg (134 lb 14.7 oz) ?IBW/kg (Calculated) : 45.5 ?Heparin dosing weight: 58 kg ? ? ?Vital Signs: ?Temp: 98 ?F (36.7 ?C) (03/07 0351) ?Temp Source: Oral (03/07 0351) ?BP: 116/58 (03/07 0351) ?Pulse Rate: 101 (03/07 0351) ? ?Labs: ?Recent Labs  ?  07/14/21 ?0911 07/14/21 ?2000 07/15/21 ?0500 07/16/21 ?0518  ?HGB 9.1*  --  8.3* 8.1*  ?HCT 29.5*  --  27.1* 27.0*  ?PLT 299  --  304 308  ?HEPARINUNFRC 0.28* 0.41 0.43 0.30  ?CREATININE 0.42*  --  0.45 0.43*  ? ? ? ?Estimated Creatinine Clearance: 48.9 mL/min (A) (by C-G formula based on SCr of 0.43 mg/dL (L)). ? ? ?Medical History: ?Past Medical History:  ?Diagnosis Date  ? Breast mass, left   ? Large  ? Cancer [facility])   ? FIBROMYOMA  AGE 52-   BENIGN  RESOLVED  ? GI bleed 05/03/2021  ? 1/23 continue with pantoprazole  ? Headache   ? Heart murmur   ? Hypertension   ? Mitral valve prolapse   ? Tuberculosis   ? AS CHILD  W/ TX  ? ? ?Medications:  ?No anticoagulants PTA  ? ?Assessment: ?76 y/o female with PMH of metastatic breast cancer who is post-op lap R colectomy, end ileostomy.  Experienced STEMI and cardiac arrest in PACU on 2/18. Started on Aspirin 81mg  daily and received 48 hours of IV heparin 2/19-2/21, then transitioned to Enoxaparin 40mg  SQ q24h for VTE prophylaxis (last dose 2/24 at 1132). Bilateral lower extremity venous duplex today + for LLE DVT. Pharmacy consulted for Apixaban dosing on 07/04/21. ? ?On 07/10/21 pharmacy was consulted to dose heparin for bridge therapy while apixaban is on hold  for planned IR procedure  ?Last dose apixaban 10 mg 2/28 @ 2208 ? ?07/16/21 ?Heparin level 0.3 units/mL, remains therapeutic on heparin infusion at 1200 units/hr however is at the low end of the range ?CBC: Hgb 8.1-low & trending back down. Pltc WNL.  ?No bleeding or infusion issues noted by RN  ? ?Goal of Therapy: ?Heparin level 0.3-0.7 units/mL ?Monitor platelets by anticoagulation protocol: Yes ? ?Plan:  ?Slight increase in heparin drip to 1250 units/hr ?Daily CBC, heparin level ?Monitor closely for s/sx of bleeding ?F/u timing of kyphoplasty & when IR wants to hold heparin infusion ? ?Dimple Nanas, PharmD ?07/16/2021 7:21 AM ? ?

## 2021-07-16 NOTE — Consult Note (Addendum)
[place] Nurse ostomy follow up ?Stoma type/location: RLQ ileostomy ?Stomal assessment/size: 1 and 1/4 inch; above skin level, red and moist.   ?Output: 50 cc soft brown stool ?Ostomy pouching: 2pc.  ?Pt is fatigued and feeling poorly and did not participate in pouch application or opening or closing to empty.  Pouch change demonstration was performed with remote translator device at the bedside.  Pt does not ask questions or interact with the process and will need total assistance with pouching activities after discharge.  No family members have been present for today or any previous teaching sessions. Pt plans to discharge to a SNF, according to progress notes. ?Applied barrier ring and 2 piece pouching system.  Use supplies:  barrier ring Kellie Simmering # [id number], wafer Kellie Simmering # 2, pouch Lawson # [id number].  3 sets of each supply left at the bedside for staff nurses use. ? ?WOC Nurse Consult Note: ?Requested to assess anterior right toes.  There are 2 small reddened areas of unknown origin with intact skin, each is approx .2X.2cm.  No topical treatment is indicated at this time.  ? ?Sacrum with dark red-purple deep tissue pressure injury; 4X8cm in a butterfly shape across sacrum and bilat buttocks.  Edges of the locations are beginning to blister and peel and reveal dark red wound bed.  ?Pressure Injury POA: No ?Dressing procedure/placement/frequency: Topical treatment orders provided for bedside nurses to perform as follows to promote moist healing: Apply xeroform gauze to sacrum/buttocks wounds Q day, then cover with foam dressing.  (Change foam dressing Q 3 days or PRN soiling.) ? ?[place] team will reassess the location weekly to determine if a change in the plan of care is indicated at that time.  ?Julien Girt MSN, RN, [place], [place], CNS ?[phone]  ?  ?

## 2021-07-16 NOTE — Progress Notes (Signed)
?Progress Note ? ?PatientJonita Estrada NWG:[id number] DOB: 04-19-1946 DOA: 06/28/2021     18 ?DOS: the patient was seen and examined on 07/16/2021 ?Brief hospital course: ? 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  ?CT abdomen with IV contrast showed large intussusception of the cecum and distal ileum within the length of the ascending colon and subsequent dilatation of the ascending colon.  ? ?General surgery was consulted.  She underwent laparoscopic right colectomy with end ileostomy on 06/29/2021. ?She had a post-op cardiac arrest and inferior STEMI-- Cardiology consulted, Not a candidate for cardiac intervention.  ?Currently we await insurance authorization for intercoastal procedure/kyphoplasty for nonpharmacological relief of her severe metastatic bone pain-earliest this will be performed is on 07/16/2021 according to family who have spoken with IR ? ?Assessment and Plan: ? ?Large Cecal and distal ileum intussusception with bowel obstruction: ?-Patient presented with severe abdominal pain associated with nausea. ?-S/p laparoscopic right colectomy with end ileostomy 2/18-Completed course of zosyn ?-Appreciate general surgery , ileostomy working. ?-Per CCS, leukocytosis likely not from intra-abdominal source. ?-CT repeat 3/1 shows small area of intussusception -- she does have metastatic disease ?-Continue to monitor white count-poor recurrent surg candidate ? ?Hypoxia ? 2/2 dilaudid IV and "splinting" of breaths? ?-demonstrated use of IS, 2/2  ?-2 vw CXR showed possible basilar PNA vs Atelectasis. ?-Unasyn started 07/15/21 given relatively immunocompromised state would continue the same and repeat x-ray in 24 to 48 hours after she is able to use I-S ?-await IR input re-eval. ? ?Acute blood loss anemia ?-Likely postoperative and dilutional  component--no reports of dark stool ?-Transfuse 1 unit blood 3/3 with acceptable rise ?-IV iron given secondary to low iron sats on 3/4 ? ?Inferior STEMI / Postop cardiac arrest ?Ischemic cardiomyopathy; ?-Troponin peaked to 24,000.   ?-TTE showed LV ejection fraction of 50%, the left ventricle demonstrated RWMA.  Mild LVH,  diastolic parameters indeterminate. ?-On heparin gtt.-when all procedures performed continue Eliquis, no aspirin ?-Pt refusing statin, zetia, imdur ?-Cardiology feels that she is a poor cath candidate and have signed off ?  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  ?Pathologic fracture deformity at the level of T11: ?-Status post left total mastectomy in 2019.   ?-Patient underwent CT-guided lung biopsy on 05/14/2021- results consistent of poorly differentiated malignancy with sarcomatoid and epithelioid features.  ?-first choice pain oxy--2nd choice IV dilaudid cautiously ?-Await insurances --proceed with osteocool and kyphoplasty at least at T11, but also potentially at L3, L4 and L5  ?-Patient is not accepting of hospice philosophy despite detailed discussions by her oncologist ?  ?History of GI bleed: ?-Continue pantoprazole ?  ?Essential hypertension: ?-Noted to have refused imdur ?-Continue Coreg 6.25 twice daily. ?  ?Aortic atherosclerosis: Noted on recent CT ?  ?Cystic mass of pancreas: ?-Previous CA 19-9 level normal. ?-Follow-up outpatient. ?-Continues on heparin until all procedures are done ? ?PTA sacral decubitus ?-turn frequently ? ?Poor overall prognosis, palliative care  IR and oncology involved ?Pain meds per palliative care's input- ?Good discussion with the patient's son on 3/6 with regards to possibility of pneumonia and  discussion about goals of care-he is hopeful for recovery while  understanding that if she has pneumonia this complicates the picture and understands that we may need to have different discussions if she fails to thrive-I went over the concept of adult failure to thrive with him in her case as well and he understands ?Await procedure ? skilled facility and or get hired help according to Education officer, museum input ? ?Therapy is recommending home health PT rolling walker with 2 wheels and bedside commode ? ? ? ?Subjective:  ? ?Looks overall improved ?Moving from chair to bed and vice versa ?Less SOB ?Countenance brighter ? ? ? ?Physical Exam: ?Vitals:  ? 07/16/21 0058 07/16/21 0351 07/16/21 0821 07/16/21 1259  ?BP: 118/61 (!) 116/58 122/62 (!) 111/54  ?Pulse: (!) 104 (!) 101 (!) 105 96  ?Resp: 20 20  20   ?Temp: 98.2 ?F (36.8 ?C) 98 ?F (36.7 ?C)  97.9 ?F (36.6 ?C)  ?TempSrc: Oral Oral  Oral  ?SpO2: 98% 98%  99%  ?Weight:      ?Height:      ? ? ?coherent on oxygen ?EOMI NCAT no focal deficit moderate dentition no thrush ?Neck soft supple ?S1-S2 no murmur ?Abdomen with ostomy ?ROM intact slightly decreased  ?She does have a small sacral decubitus and area of pressure injury in her buttocks ? ?Data Reviewed: ? ?Labs reviewed, Na 131 white count 21 ?hemoglobin 7.1-->8.1  ? ?Family Communication: Discussed with the patient's son Slava at the bedside qd ? ?Disposition: ?Status is: Inpatient ?Remains inpatient appropriate because: Severity of illness ? ? Planned Discharge Destination:  SNF eventually when all work-up and procedures are done. ? ? ?Author: ?Nita Sells, MD ?07/16/2021 5:03 PM ? ?For on call review [email or web address].  ? ?

## 2021-07-16 NOTE — Progress Notes (Signed)
Occupational Therapy Treatment ?Patient Details ?Name: Robin Estrada ?MRN: [id number] ?DOB: 11-May-1946 ?Today's Date: 07/16/2021 ? ? ?History of present illness 76 year old [name] with PMH significant of lung cancer, history of breast cancer, new vertebral masses presents in the ER with several days history of worsening abdominal pain, and nausea. CT abdomen with IV contrast showed large intussusception of the cecum and distal ileum within the length of the ascending colon and subsequent dilatation of the ascending colon. There are also noted various and numerous lytic lesions throughout the thoracolumbar spine and pelvis consistent with metastatic disease. General surgery was consulted.  She underwent laparoscopic right colectomy with end ileostomy on 06/29/2021.  She had a post-op cardiac arrest and inferior STEMI-- Cardiology consulted, Not a candidate for cardiac intervention ?  ?OT comments ? Patient was noted to require increased assistance for bed mobility, sitting balance and breif standing with RW. Patient was noted to have increased posterior leaning with increased fatigue and noted increased edema in LUE. Patient pending kyphoplasty at this time. Patient would need 24/7 physical caregiver assistance to be successful in next level of care at current assist levels.  Patient would continue to benefit from skilled OT services at this time while admitted and after d/c to address noted deficits in order to improve overall safety and independence in ADLs.  ?  ? ?Recommendations for follow up therapy are one component of a multi-disciplinary discharge planning process, led by the attending physician.  Recommendations may be updated based on patient status, additional functional criteria and insurance authorization. ?   ?Follow Up Recommendations ? Skilled nursing-short term rehab (<3 hours/day)  ?  ?Assistance Recommended at Discharge Frequent or constant Supervision/Assistance  ?Patient can return home with  the following ? A lot of help with bathing/dressing/bathroom;Assistance with cooking/housework;Help with stairs or ramp for entrance;Assist for transportation;A lot of help with walking and/or transfers;Direct supervision/assist for medications management ?  ?Equipment Recommendations ? BSC/3in1  ?  ?Recommendations for Other Services   ? ?  ?Precautions / Restrictions Precautions ?Precautions: Fall ?Precaution Comments: R LQ ileostomy, ?Restrictions ?Weight Bearing Restrictions: No ?RLE Weight Bearing: Weight bearing as tolerated ?LLE Weight Bearing: Weight bearing as tolerated  ? ? ?  ? ?Mobility Bed Mobility ?Overal bed mobility: Needs Assistance ?Bed Mobility: Supine to Sit, Sit to Supine ?  ?  ?Supine to sit: Max assist ?Sit to supine: Max assist ?  ?General bed mobility comments: increased time; assist for B LEs back onto EOB. Intermittent poor seated balance with posterior leaning while EOB requiring up to MOD A to return to midline. patient was able to sit with no support for about 10 mins on edge of bed with encouragement ?  ? ?Transfers ?  ?  ?  ?  ?  ?  ?  ?  ?  ?  ?  ?  ?Balance Overall balance assessment: Needs assistance ?Sitting-balance support: Feet supported, Bilateral upper extremity supported ?Sitting balance-Leahy Scale: Poor ?  ?  ?  ?  ?  ?  ?  ?  ?  ?  ?  ?  ?  ?  ?  ?  ?   ? ?ADL either performed or assessed with clinical judgement  ? ?ADL Overall ADL's : Needs assistance/impaired ?  ?  ?  ?  ?  ?  ?  ?  ?  ?  ?Lower Body Dressing: Maximal assistance;Sitting/lateral leans ?Lower Body Dressing Details (indicate cue type and reason): patient was educated on donning socks  with sock aid with patient able to get sock in correct position on sock aid with SUP and increased time. patient was max A to pull sock onto foot. patient requested to have further training in future with these tools. patient was educated that there would be more opportunties for this in future sessions. ?  ?  ?  ?  ?  ?  ?   ?General ADL Comments: patient was noted to have strong posterior leaning with inital sitting on edge of bed that patient was able to progress to min guard with occasional cues to lean forwards. patient was maxA to maitnain standing balance on edge of bed with strong posterior leaning with RW noted. patient unable to correct balance in standing while nurse adjusted dressing on bottom.patient was max A x2 for postioning in bed with pillow positioned under LUE with education on keeping LUE elevated to reduce edema. ?  ? ?Extremity/Trunk Assessment Upper Extremity Assessment ?Upper Extremity Assessment: LUE deficits/detail ?LUE Deficits / Details: patient was noted to have increased edema in this UE compared to last session. ?  ?  ?  ?  ?  ? ?Vision   ?  ?  ?Perception   ?  ?Praxis   ?  ? ?Cognition Arousal/Alertness: Awake/alert ?Behavior During Therapy: [facility] for tasks assessed/performed ?Overall Cognitive Status: Within Functional Limits for tasks assessed ?  ?  ?  ?  ?  ?  ?  ?  ?  ?  ?  ?  ?  ?  ?  ?  ?General Comments: patients son was present and wanted to translate for patient. ?  ?  ?   ?Exercises   ? ?  ?Shoulder Instructions   ? ? ?  ?General Comments    ? ? ?Pertinent Vitals/ Pain       Pain Assessment ?Pain Assessment: Faces ?Faces Pain Scale: Hurts whole lot ?Pain Location: back and R hip ?Pain Descriptors / Indicators: Sore ?Pain Intervention(s): Premedicated before session, Monitored during session, Limited activity within patient's tolerance, Repositioned ? ?Home Living   ?  ?  ?  ?  ?  ?  ?  ?  ?  ?  ?  ?  ?  ?  ?  ?  ?  ?  ? ?  ?Prior Functioning/Environment    ?  ?  ?  ?   ? ?Frequency ? Min 2X/week  ? ? ? ? ?  ?Progress Toward Goals ? ?OT Goals(current goals can now be found in the care plan section) ? Progress towards OT goals: Not progressing toward goals - comment (patient was noted to require increased physical assistance during session with son present) ? ?   ?Plan Discharge plan remains  appropriate   ? ?Co-evaluation ? ? ?   ?  ?  ?  ?  ? ?  ?AM-PAC OT "6 Clicks" Daily Activity     ?Outcome Measure ? ? Help from another person eating meals?: None ?Help from another person taking care of personal grooming?: A Little ?Help from another person toileting, which includes using toliet, bedpan, or urinal?: A Little ?Help from another person bathing (including washing, rinsing, drying)?: A Lot ?Help from another person to put on and taking off regular upper body clothing?: A Little ?Help from another person to put on and taking off regular lower body clothing?: A Lot ?6 Click Score: 17 ? ?  ?End of Session Equipment Utilized During Treatment: Gait belt ? ?OT Visit Diagnosis:  Unsteadiness on feet (R26.81);Other abnormalities of gait and mobility (R26.89);Muscle weakness (generalized) (M62.81) ?  ?Activity Tolerance Patient limited by pain ?  ?Patient Left in chair;with call bell/phone within reach;with chair alarm set ?  ?Nurse Communication Mobility status;Other (comment) (nurse present for dressing changes and positioning in bed assist) ?  ? ?   ? ?Time: 6294-7654 ?OT Time Calculation (min): 23 min ? ?Charges: OT General Charges ?$OT Visit: 1 Visit ?OT Treatments ?$Self Care/Home Management : 23-37 mins ? ?Jael Kostick OTR/L, MS ?Acute Rehabilitation Department ?Office# [phone] ?Pager# [phone] ? ? ?Robin Estrada ?07/16/2021, 3:58 PM ?

## 2021-07-16 NOTE — Progress Notes (Signed)
Spoke w/ Robin Estrada in IR and she stated that patient insurance authorization is still pending it can take 3-10 business days to get authorization for Kyphoplasty. Patient is currently scheduled for Thursday 07/18/21 in hopes of having insurance authorization. If insurance authorization is still pending on Thursday 07/18/21 the procedure will be pushed back.  ?

## 2021-07-17 ENCOUNTER — Inpatient Hospital Stay (HOSPITAL_COMMUNITY): Payer: Medicare HMO

## 2021-07-17 ENCOUNTER — Encounter (HOSPITAL_COMMUNITY): Payer: Self-pay | Admitting: Family Medicine

## 2021-07-17 DIAGNOSIS — K561 Intussusception: Secondary | ICD-10-CM | POA: Diagnosis not present

## 2021-07-17 LAB — COMPREHENSIVE METABOLIC PANEL
ALT: 20 U/L (ref 0–44)
AST: 36 U/L (ref 15–41)
Albumin: 1.9 g/dL — ABNORMAL LOW (ref 3.5–5.0)
Alkaline Phosphatase: 396 U/L — ABNORMAL HIGH (ref 38–126)
Anion gap: 11 (ref 5–15)
BUN: 10 mg/dL (ref 8–23)
CO2: 22 mmol/L (ref 22–32)
Calcium: 9.6 mg/dL (ref 8.9–10.3)
Chloride: 101 mmol/L (ref 98–111)
Creatinine, Ser: 0.57 mg/dL (ref 0.44–1.00)
GFR, Estimated: >60 mL/min (ref >60)
Glucose, Bld: 117 mg/dL — ABNORMAL HIGH (ref 70–99)
Potassium: 4 mmol/L (ref 3.5–5.1)
Sodium: 134 mmol/L — ABNORMAL LOW (ref 135–145)
Total Bilirubin: 0.3 mg/dL (ref 0.3–1.2)
Total Protein: 6 g/dL — ABNORMAL LOW (ref 6.5–8.1)

## 2021-07-17 LAB — CBC WITH DIFFERENTIAL/PLATELET
Abs Immature Granulocytes: 1.21 10*3/uL — ABNORMAL HIGH (ref 0.00–0.07)
Basophils Absolute: 0.2 10*3/uL — ABNORMAL HIGH (ref 0.0–0.1)
Basophils Relative: 1 %
Eosinophils Absolute: 0.4 10*3/uL (ref 0.0–0.5)
Eosinophils Relative: 2 %
HCT: 32.7 % — ABNORMAL LOW (ref 36.0–46.0)
Hemoglobin: 9.3 g/dL — ABNORMAL LOW (ref 12.0–15.0)
Immature Granulocytes: 5 %
Lymphocytes Relative: 3 %
Lymphs Abs: 0.8 10*3/uL (ref 0.7–4.0)
MCH: 27.9 pg (ref 26.0–34.0)
MCHC: 28.4 g/dL — ABNORMAL LOW (ref 30.0–36.0)
MCV: 98.2 fL (ref 80.0–100.0)
Monocytes Absolute: 0.9 10*3/uL (ref 0.1–1.0)
Monocytes Relative: 4 %
Neutro Abs: 22.1 10*3/uL — ABNORMAL HIGH (ref 1.7–7.7)
Neutrophils Relative %: 85 %
Platelets: 326 10*3/uL (ref 150–400)
RBC: 3.33 MIL/uL — ABNORMAL LOW (ref 3.87–5.11)
RDW: 23.4 % — ABNORMAL HIGH (ref 11.5–15.5)
WBC: 25.6 10*3/uL — ABNORMAL HIGH (ref 4.0–10.5)
nRBC: 0 % (ref 0.0–0.2)

## 2021-07-17 LAB — HEPARIN LEVEL (UNFRACTIONATED)
Heparin Unfractionated: 0.23 IU/mL — ABNORMAL LOW (ref 0.30–0.70)
Heparin Unfractionated: 0.35 IU/mL (ref 0.30–0.70)
Heparin Unfractionated: 0.32 IU/mL (ref 0.30–0.70)

## 2021-07-17 LAB — MAGNESIUM: Magnesium: 1.8 mg/dL (ref 1.7–2.4)

## 2021-07-17 LAB — PROCALCITONIN: Procalcitonin: 0.73 ng/mL

## 2021-07-17 MED ORDER — SODIUM CHLORIDE 0.9 % IV SOLN
1.0000 g | Freq: Three times a day (TID) | INTRAVENOUS | Status: DC
  Administered 2021-07-17 – 2021-07-19 (×5): 1 g via INTRAVENOUS
  Filled 2021-07-17: qty 20
  Filled 2021-07-17: qty 1
  Filled 2021-07-17 (×3): qty 20
  Filled 2021-07-17: qty 1

## 2021-07-17 MED ORDER — HEPARIN (PORCINE) 25000 UT/250ML-% IV SOLN
1350.0000 [IU]/h | INTRAVENOUS | Status: AC
  Administered 2021-07-17: 1350 [IU]/h via INTRAVENOUS
  Filled 2021-07-17 (×2): qty 250

## 2021-07-17 NOTE — Progress Notes (Signed)
PT Cancellation Note ? ?Patient Details ?Name: Robin Estrada ?MRN: [id number] ?DOB: August 22, 1945 ? ? ?Cancelled Treatment:    Reason Eval/Treat Not Completed: Patient at procedure or test/unavailable. Pt off of the unit.  Will check back as schedule permits. ? ? ?[name] ?07/17/2021, 9:21 AM ?

## 2021-07-17 NOTE — Progress Notes (Deleted)
? ?Office Visit  ?  ?Patient Name: Robin Estrada ?Date of Encounter: 07/17/2021 ? ?PCP:  Plotnikov, Evie Lacks, MD ?  ?[place]  ?Cardiologist:  Jenkins Rouge, MD  ?Advanced Practice Provider:  No care team member to display ?Electrophysiologist:  None  ? ? ?Chief Complaint  ?  ?Robin Estrada is a 76 y.o. female with a hx of metastatic breast cancer with mets to spine and lung, history of GI bleed, admitted with cecum/ileum intussusception (surgery 06/29/2021 lap right colectomy), postop cardiac arrest in PACU presents today for hospital follow-up. ? ?On recent admission, VT arrest in the PACU after her surgery.  Chest compressions and defibrillation x1, received epinephrine.  ROSC in 90 seconds.  No intubation.  Postcode EKG concerning for inferior STEMI.  Dr. Alvester Chou was the on-call interventional list and she was determined not to be a cath candidate given metastatic cancer and colectomy.  Repeat EKG in the ICU showed ongoing inferior ST elevations.  Determined to have limited management options from a cardiac standpoint.  Started aspirin, she has a statin allergy but was started on low-dose pravastatin, and nitroglycerin.  Suggested palliative care involvement. ? ?Today, she *** ? ? ? ?Past Medical History  ?  ?Past Medical History:  ?Diagnosis Date  ? Breast mass, left   ? Large  ? Cancer [facility])   ? FIBROMYOMA  AGE 28-   BENIGN  RESOLVED  ? GI bleed 05/03/2021  ? 1/23 continue with pantoprazole  ? Headache   ? Heart murmur   ? Hypertension   ? Mitral valve prolapse   ? Tuberculosis   ? AS CHILD  W/ TX  ? ?Past Surgical History:  ?Procedure Laterality Date  ? BIOPSY  05/04/2021  ? Procedure: BIOPSY;  Surgeon: Irving Copas., MD;  Location: [place];  Service: Gastroenterology;;  ? BRONCHIAL BIOPSY  05/07/2021  ? Procedure: BRONCHIAL BIOPSIES;  Surgeon: Margaretha Seeds, MD;  Location: [facility] ENDOSCOPY;  Service: Pulmonary;;  ? BRONCHIAL BRUSHINGS  05/07/2021  ? Procedure:  BRONCHIAL BRUSHINGS;  Surgeon: Margaretha Seeds, MD;  Location: [place];  Service: Pulmonary;;  ? BRONCHIAL NEEDLE ASPIRATION BIOPSY  05/07/2021  ? Procedure: BRONCHIAL NEEDLE ASPIRATION BIOPSIES;  Surgeon: Margaretha Seeds, MD;  Location: [place];  Service: Pulmonary;;  ? BRONCHIAL WASHINGS  05/07/2021  ? Procedure: BRONCHIAL WASHINGS;  Surgeon: Margaretha Seeds, MD;  Location: [facility] ENDOSCOPY;  Service: Pulmonary;;  ? COLONOSCOPY WITH PROPOFOL N/A 05/09/2021  ? Procedure: COLONOSCOPY WITH PROPOFOL;  Surgeon: Ladene Artist, MD;  Location[facility] ENDOSCOPY;  Service: Endoscopy;  Laterality: N/A;  ? ESOPHAGOGASTRODUODENOSCOPY N/A 05/04/2021  ? Procedure: ESOPHAGOGASTRODUODENOSCOPY (EGD);  Surgeon: Irving Copas., MD;  Location: [place];  Service: Gastroenterology;  Laterality: N/A;  ? HEMOSTASIS CONTROL  05/07/2021  ? Procedure: HEMOSTASIS CONTROL;  Surgeon: Margaretha Seeds, MD;  Location: [place];  Service: Pulmonary;;  ? LAPAROSCOPIC PARTIAL RIGHT COLECTOMY N/A 06/29/2021  ? Procedure: LAPAROSCOPIC PARTIAL RIGHT COLECTOMY WITH END ILEOSTOMY;  Surgeon: Leighton Ruff, MD;  Location: [facility];  Service: General;  Laterality: N/A;  ? MASS EXCISION Left 08/14/2017  ? Procedure: PARTIAL EXCISION  LEFT BREAST  MASS ERAS PATHWAY;  Surgeon: Fanny Skates, MD;  Location: [place];  Service: General;  Laterality: Left;  ? MASTECTOMY W/ SENTINEL NODE BIOPSY Left 09/14/2017  ? Procedure: LEFT TOTAL MASTECTOMY WITH SENTINEL LYMPH NODE BIOPSY;  Surgeon: Fanny Skates, MD;  Location: [place];  Service: General;  Laterality: Left;  ? NO  PAST SURGERIES    ? POLYPECTOMY  05/09/2021  ? Procedure: POLYPECTOMY;  Surgeon: Ladene Artist, MD;  Location: [facility] ENDOSCOPY;  Service: Endoscopy;;  ? SUBMUCOSAL TATTOO INJECTION  05/09/2021  ? Procedure: SUBMUCOSAL TATTOO INJECTION;  Surgeon: Ladene Artist, MD;  Location: [place];  Service: Endoscopy;;  ? VIDEO BRONCHOSCOPY WITH ENDOBRONCHIAL ULTRASOUND N/A  05/07/2021  ? Procedure: VIDEO BRONCHOSCOPY WITH ENDOBRONCHIAL ULTRASOUND;  Surgeon: Margaretha Seeds, MD;  Location: [place];  Service: Pulmonary;  Laterality: N/A;  ? ? ?Allergies ? ?Allergies  ?Allergen Reactions  ? Statins Nausea And Vomiting and Other (See Comments)  ?  MUSCLE PAIN  ? Amlodipine   ?  fatigue  ? Clonidine Derivatives   ?  headache  ? Losartan   ?  abd pain  ? Sulfa Antibiotics Nausea Only  ? Sulfamethoxazole-Trimethoprim Nausea Only  ?  " Severe Nausea "  ? ? ?EKGs/Labs/Other Studies Reviewed:  ? ?The following studies were reviewed today: ?*** ? ?EKG:  EKG is *** ordered today.  The ekg ordered today demonstrates *** ? ?Recent Labs: ?04/01/2021: TSH 3.21 ?05/12/2021: B Natriuretic Peptide 729.0 ?07/17/2021: ALT 20; BUN 10; Creatinine, Ser 0.57; Hemoglobin 9.3; Magnesium 1.8; Platelets 326; Potassium 4.0; Sodium 134  ?Recent Lipid Panel ?   ?Component Value Date/Time  ? CHOL 142 06/30/2021 0320  ? TRIG 183 (H) 06/30/2021 0320  ? HDL 30 (L) 06/30/2021 0320  ? CHOLHDL 4.7 06/30/2021 0320  ? VLDL 37 06/30/2021 0320  ? [place] 75 06/30/2021 0320  ? ? ?Risk Assessment/Calculations:  ?{Does this patient have ATRIAL FIBRILLATION?[phone]} ? ?Home Medications  ? ?No outpatient medications have been marked as taking for the 07/18/21 encounter (Appointment) with Elgie Collard, PA-C.  ?  ? ?Review of Systems  ? ***   ?All other systems reviewed and are otherwise negative except as noted above. ? ?Physical Exam  ?  ?VS:  There were no vitals taken for this visit. , BMI There is no height or weight on file to calculate BMI. ? ?Wt Readings from Last 3 Encounters:  ?07/15/21 134 lb 14.7 oz (61.2 kg)  ?06/28/21 110 lb 9.6 oz (50.2 kg)  ?06/10/21 120 lb 12.8 oz (54.8 kg)  ?  ? ?GEN: Well nourished, well developed, in no acute distress. ?HEENT: normal. ?Neck: Supple, no JVD, carotid bruits, or masses. ?Cardiac: ***RRR, no murmurs, rubs, or gallops. No clubbing, cyanosis, edema.  ***Radials/PT 2+ and equal  bilaterally.  ?Respiratory:  ***Respirations regular and unlabored, clear to auscultation bilaterally. ?GI: Soft, nontender, nondistended. ?MS: No deformity or atrophy. ?Skin: Warm and dry, no rash. ?Neuro:  Strength and sensation are intact. ?Psych: Normal affect. ? ?Assessment & Plan  ?  ?Postop cardiac arrest/STEMI ?-Was not a cardiac cath candidate ? ?Metastatic breast cancer ?-Mets to spine and lung ? ?History of GI bleed ? ?  ? ? ?Disposition: Follow up {follow up:15908} with Jenkins Rouge, MD or APP. ? ?Signed, ?Elgie Collard, PA-C ?07/17/2021, 10:22 AM ?[facility]  ?

## 2021-07-17 NOTE — Progress Notes (Signed)
Pharmacy Brief Note - Evening Anticoagulation Follow Up: ? ?Patient is a 13 yoF on heparin drip while apixaban is on hold for planned procedure. For full history, see note by Robin Estrada, PharmD from earlier today.  ? ?Assessment: ?HL = 0.35 is therapeutic on heparin infusion of 1350 units/hr ?Confirmed with RN that heparin infusing at correct rate. No interruptions/issues with infusion. No signs of bleeding.  ? ?Received a verbal order from Robin Eagle, PA-C to hold heparin drip at 0800 tomorrow morning for possible procedure.  ? ?Plan: ?Continue heparin infusion at current rate of 1350 units/hr ?Check confirmatory level ?HL, CBC daily with AM labs ?Monitor for signs of bleeding ?Heparin to stop at 0800 on 3/9. Stop time entered. ? ?Robin Estrada, PharmD ?07/17/21 ?4:35 PM ? ?

## 2021-07-17 NOTE — Progress Notes (Signed)
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  CT abdomen with IV contrast showed large intussusception of the cecum and distal ileum within the length of the ascending colon and subsequent dilatation of the ascending colon. General surgery was consulted.  She underwent laparoscopic right colectomy with end ileostomy on 06/29/2021.She had a post-op cardiac arrest and inferior STEMI-- Cardiology consulted, Not a candidate for cardiac intervention.  Currently on medical management with Coreg.  Also placed on Unasyn.  Overall tolerating diet having colostomy output, alert awake slowly progressing. Currently we await insurance authorization for intercoastal procedure/kyphoplasty for nonpharmacological relief of her severe metastatic bone pain    Subjective: Seen and examined.  Complains of pain in the legs. Son is at the bedside. Overnight no fever no nausea vomiting.  Had mild cough otherwise no chest pain  Assessment and Plan:  Large cecal and distal ileum intussusception with bowel obstruction: Presented with severe abdominal pain with nausea and found to have intussusception.Status post laparoscopic right colectomy with end ileostomy 2/18, general surgery following.  Was on Zosyn postoperatively completed, colostomy having output, continue on regular diet.  Repeat CT 3/1 with small areas of intussusception, and does have metastatic disease.  Patient has persistent leukocytosis-source intra-abdominal versus pulmonary-she is poor surgical surgical candidate  Transient acute hypoxic aspiratory failure hypoxia resolved Pneumonia versus atelectasis: Chest x-ray with progression of opacity in  the right lung base compatible with right pleural effusion and right lower lobe consolidation.  Currently on Unasyn ( since 3/6),patient with persistent leukocytosis I will check procalcitonin-we will consider switching antibiotics.Encourage OOB, I-S PT OT.  Acute blood loss anemia Anemia of chronic disease: Needed transfusion 1 u PRBC on 3/3 and IV iron on 3/4.  Hemoglobin is stable.  Monitor Recent Labs  Lab 07/13/21 0530 07/14/21 0911 07/15/21 0500 07/16/21 0518 07/17/21 0518  HGB 9.5* 9.1* 8.3* 8.1* 9.3*  HCT 31.4* 29.5* 27.1* 27.0* 32.7*     Inferior STEMI / Postop cardiac arrest  Ischemic cardiomyopathy; -Troponin peaked to 24,000, s/p TTE  w/LV ejection fraction of 50%, the left ventricle demonstrated RWMA.  Being treated with heparin gtt-when all procedures performed continue transition to Eliquis, no asa.Pt refusing statin, zetia, imdur. Cardiology feels that she is a poor cath candidate and have signed off.   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  Pathologic fracture deformity at the level of T11: She is s/p total mastectomy in 2019,  s/p CT-guided lung biopsy on 05/14/2021- results consistent of poorly differentiated malignancy with sarcomatoid and epithelioid features.  Continue with oral oxy and IV Dilaudid as needed. Awaiting insurances to proceed with osteocool and kyphoplasty at least at T11, but also potentially at L3, L4 and L5.Patient is not accepting of hospice philosophy despite detailed discussions by her oncologist  History of GI bleed hemoglobin is stable on PPI Essential hypertension: BP fairly stable on low-dose Coreg. Cystic mass of pancreas previous CA 19-9 level normal follow-up outpatient.  PTA sacral  decubitus:turn frequently  Poor overall prognosis, palliative care  IR and oncology  involved Pain meds per palliative care's input- Good discussion with the patient's son on 3/6 with regards to possibility of pneumonia and discussion about goals of care-he is hopeful for recovery while understanding that if she has pneumonia this complicates the picture and understands that we may need to have different discussions if she fails to thrive-I went over the concept of adult failure to thrive with him in her case as well and he understands Await procedure ? skilled facility and or get hired help according to Education officer, museum input  PTOT-recommending home health PT rolling walker with 2 wheels and bedside commode DVT prophylaxis: Place and maintain sequential compression device Start: 07/03/21 0815 Code Status:   Code Status: Full Code Family Communication: plan of care discussed with patient/Son at bedside.  Disposition: Currently not medically stable for discharge. Status is: Inpatient Remains inpatient appropriate because: Ongoing management of pain respiratory failure leukocytosis Objective: Vitals last 24 hrs: Vitals:   07/16/21 2104 07/17/21 0439 07/17/21 0759 07/17/21 0759  BP: (!) 115/52 128/64 125/77 125/77  Pulse: (!) 105 (!) 110 (!) 109 (!) 109  Resp: 20 20    Temp: 97.9 F (36.6 C) 98.5 F (36.9 C)    TempSrc: Oral Oral    SpO2: 91% 91%    Weight:      Height:       Weight change:   Physical Examination: General exam: AA0X3,older than stated age, weak appearing. HEENT:Oral mucosa moist, Ear/Nose WNL grossly, dentition normal. Respiratory system: b/l clear BS, no use of accessory muscle Cardiovascular system: S1 & S2 +, No JVD,. Gastrointestinal system: Abdomen soft,NT,ND, BS+, colostomy in place with stool Nervous System:Alert, awake, moving extremities and grossly nonfocal Extremities: LE edema none,distal peripheral pulses palpable.  Skin: No rashes,no icterus. MSK:  Normal muscle bulk,tone, power  Medications reviewed:  Scheduled Meds:  (feeding supplement) PROSource Plus  30 mL Oral TID BM   carvedilol  3.125 mg Oral BID WC   DULoxetine  20 mg Oral Daily   feeding supplement  1 Container Oral BID BM   lidocaine  1 patch Transdermal Q24H   methocarbamol  500 mg Oral TID   mirtazapine  15 mg Oral QHS   multivitamin with minerals  1 tablet Oral Daily   nutrition supplement (JUVEN)  1 packet Oral BID BM   pantoprazole  40 mg Oral QHS   Continuous Infusions:  ampicillin-sulbactam (UNASYN) IV 3 g (07/17/21 1139)   heparin 1,350 Units/hr (07/17/21 0757)      Diet Order             Diet regular Room service appropriate? Yes; Fluid consistency: Thin  Diet effective now                    Nutrition Problem: Increased nutrient needs Etiology: cancer and cancer related treatments, post-op healing Signs/Symptoms: estimated needs Interventions: [employer], [employer], MVI, Refer to RD note for recommendations   Intake/Output Summary (Last 24 hours) at 07/17/2021 1311 Last data filed at 07/17/2021 1200 Gross per 24 hour  Intake 310 ml  Output 745 ml  Net -435 ml   Net IO Since Admission: [place].08 mL [07/17/21 1311]  Wt Readings from Last 3 Encounters:  07/15/21 61.2 kg  06/28/21 50.2 kg  06/10/21 54.8 kg     Unresulted Labs (From admission, onward)     Start     Ordered   07/18/21 0500  Procalcitonin  Daily,   R     Question:  Specimen collection method  Answer:  Lab=Lab collect   07/17/21 1309   07/17/21 1600  Heparin level (unfractionated)  Once-Timed,   TIMED       Question:  Specimen collection method  Answer:  Lab=Lab collect   07/17/21 1004   07/17/21 1310  Procalcitonin - Baseline  Add-on,   AD       Question:  Specimen collection method  Answer:  Lab=Lab collect   07/17/21 1309   07/16/21 0500  CBC with Differential/Platelet  Daily,   R     Question:  Specimen collection method  Answer:  Lab=Lab collect    07/15/21 0614   07/15/21 0500  Heparin level (unfractionated)  Daily,   R     Question:  Specimen collection method  Answer:  Lab=Lab collect   07/15/21 0001   Unscheduled  Occult blood card to lab, stool  As needed,   R      07/16/21 0803   Unscheduled  Basic metabolic panel  Daily,   R     Question:  Specimen collection method  Answer:  Lab=Lab collect   07/17/21 1310          Data Reviewed: I have personally reviewed following labs and imaging studies CBC: Recent Labs  Lab 07/13/21 0530 07/14/21 0911 07/15/21 0500 07/16/21 0518 07/17/21 0518  WBC 17.4* 18.9* 20.2* 21.5* 25.6*  NEUTROABS 15.3* 16.5* 17.7* 18.3* 22.1*  HGB 9.5* 9.1* 8.3* 8.1* 9.3*  HCT 31.4* 29.5* 27.1* 27.0* 32.7*  MCV 91.0 89.4 91.6 93.4 98.2  PLT 320 299 304 308 235   Basic Metabolic Panel: Recent Labs  Lab 07/13/21 0530 07/14/21 0911 07/15/21 0500 07/16/21 0518 07/17/21 0518  NA 130* 131* 133* 131* 134*  K 3.5 4.0 3.7 3.5 4.0  CL 97* 99 100 99 101  CO2 25 24 25 24 22   GLUCOSE 109* 139* 129* 120* 117*  BUN 12 12 15 10 10   CREATININE 0.55 0.42* 0.45 0.43* 0.57  CALCIUM 8.5* 8.7* 8.9 8.9 9.6  MG  --   --   --   --  1.8   GFR: Estimated Creatinine Clearance: 48.9 mL/min (by C-G formula based on SCr of 0.57 mg/dL). Liver Function Tests: Recent Labs  Lab 07/13/21 0530 07/14/21 0911 07/15/21 0500 07/16/21 0518 07/17/21 0518  AST 23 26 25 29  36  ALT 13 12 13 15 20   ALKPHOS 208* 229* 241* 308* 396*  BILITOT 0.5 0.5 0.3 0.2* 0.3  PROT 5.3* 5.2* 5.2* 4.9* 6.0*  ALBUMIN 1.7* 1.6* 1.7* 1.6* 1.9*   No results for input(s): LIPASE, AMYLASE in the last 168 hours. No results for input(s): AMMONIA in the last 168 hours. Coagulation Profile: Recent Labs  Lab 07/12/21 0543  INR 1.2   Cardiac Enzymes: No results for input(s): CKTOTAL, CKMB, CKMBINDEX, TROPONINI in the last 168 hours. BNP (last 3 results) No results for input(s): PROBNP in the last 8760 hours. HbA1C: No results for  input(s): HGBA1C in the last 72 hours. CBG: No results for input(s): GLUCAP in the last 168 hours. Lipid Profile: No results for input(s): CHOL, HDL, LDLCALC, TRIG, CHOLHDL, LDLDIRECT in the last 72 hours. Thyroid Function Tests: No results for input(s): TSH, T4TOTAL, FREET4, T3FREE, THYROIDAB in the last 72 hours. Anemia Panel: No results for input(s): VITAMINB12, FOLATE, FERRITIN, TIBC, IRON, RETICCTPCT in the last 72 hours. Sepsis Labs: No results for input(s): PROCALCITON, LATICACIDVEN in the last 168 hours.  No results found for this or any previous visit (from  the past 240 hour(s)).  Antimicrobials: Anti-infectives (From admission, onward)    Start     Dose/Rate Route Frequency Ordered Stop   07/15/21 0615  Ampicillin-Sulbactam (UNASYN) 3 g in sodium chloride 0.9 % 100 mL IVPB        3 g 200 mL/hr over 30 Minutes Intravenous Every 6 hours 07/15/21 0608     06/28/21 2200  piperacillin-tazobactam (ZOSYN) IVPB 3.375 g  Status:  Discontinued        3.375 g 12.5 mL/hr over 240 Minutes Intravenous Every 8 hours 06/28/21 2120 07/04/21 0841      Culture/Microbiology    Component Value Date/Time   SDES URINE, CLEAN CATCH 05/03/2021 0539   SPECREQUEST NONE 05/03/2021 0539   CULT (A) 05/03/2021 0539    <10,000 COLONIES/mL INSIGNIFICANT GROWTH Performed at [facility] [address] [place], [place] [address]    REPTSTATUS 05/04/2021 FINAL 05/03/2021 0539  Other culture-see note 9 Radiology Studies:DG Chest 2 View  Result Date: 07/17/2021 CLINICAL DATA:  Follow-up pneumothorax. History of breast cancer and lung cancer EXAM: CHEST - 2 VIEW COMPARISON:  07/14/2021 FINDINGS: Interval progression of right pleural effusion and right lower lobe consolidation. Negative for pneumothorax.  Left lung remains clear. IMPRESSION: Progression of opacity in the right lung base compatible with right pleural effusion and right lower lobe consolidation. Electronically Signed   By: Franchot Gallo M.D.   On: 07/17/2021 09:52     LOS: 19 days   Antonieta Pert, MD [facility]  07/17/2021, 1:11 PM

## 2021-07-17 NOTE — Progress Notes (Signed)
? ?  Brief Note: ? ?Insurance approval has not yet been acquired, though was submitted 07/12/21.  It will likely be next week before approval and thus KP can take place. ? ?Electronically Signed: ?Pasty Spillers, PA ?07/17/2021, 12:46 PM ? ? ? ? ? ? ? ?

## 2021-07-17 NOTE — Hospital Course (Addendum)
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  CT abdomen with IV contrast showed large intussusception of the cecum and distal ileum within the length of the ascending colon and subsequent dilatation of the ascending colon. General surgery was consulted.  She underwent laparoscopic right colectomy with end ileostomy on 06/29/2021.She had a post-op cardiac arrest and inferior STEMI-- Cardiology consulted, Not a candidate for cardiac intervention.overall tolerating diet having colostomy output, alert awake slowly progressing-but he still having ongoing bone pain due to her metastatic disease, kyphoplasty was not approved by her insurance.  Patient remains deconditioned weak with overall poor prognosis, oncology has discussed some.  Would like to try rehabilitation at home, and possibly at the skilled nursing facility if he gets option with better rating.

## 2021-07-17 NOTE — H&P (Incomplete)
Chief Complaint: Patient was seen in consultation today for metastatic bone pain at the request of Dr. Burr Estrada  Supervising Physician: Robin Estrada  Patient Status: [facility] - In-pt  History of Present Illness: Patient known to IR service from right upper lobe lung mass biopsy on 05/14/2021.  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  Imaging revealed large intussusception of the cecum and distal ileum within the length of the ascending colon and subsequent dilatation of the ascending colon.  There were also numerous lytic lesions throughout the thoracolumbar spine and pelvis consistent with metastatic disease.  She underwent laparoscopic right colectomy with end ileostomy on 2/18.  She had postop cardiac arrest and inferior STEMI ; also with ischemic cardiomyopathy ;she was not a candidate for cardiac intervention per cardiology.  In addition patient also has T11 pathologic fracture along with abnormal ventral epidural enhancement consistent with epidural spread of tumor.  Metastatic lesions  at all vertebral levels, worst at L3 and L4 as well as T5, and T8.  She has mid and lower back pain with some radiation of pain down the right lower extremity.  She is status post palliative radiotherapy.  She does have acute DVT involving left posterior tibial veins.  She is currently on IV heparin. Case reviewed and approved for multilevel kyphoplasty and osteocool ablation at T8, T11, L3, and L4 by Dr. Pascal Estrada.  Discussed case with son per patient's request.  She currently endorses back pain, and denies fever, headache, chest pain, worsening dyspnea, cough, abdominal pain, nausea, vomiting or bleeding.  Past Medical History:  Diagnosis Date   Breast mass, left    Large   Cancer ([place])     FIBROMYOMA  AGE 15-   BENIGN  RESOLVED   GI bleed 05/03/2021   1/23 continue with pantoprazole   Headache    Heart murmur    Hypertension    Mitral valve prolapse    Tuberculosis    AS CHILD  W/ TX    Past Surgical History:  Procedure Laterality Date   BIOPSY  05/04/2021   Procedure: BIOPSY;  Surgeon: Robin Estrada., MD;  Location: [place];  Service: Gastroenterology;;   BRONCHIAL BIOPSY  05/07/2021   Procedure: BRONCHIAL BIOPSIES;  Surgeon: Robin Seeds, MD;  Location: [facility] ENDOSCOPY;  Service: Pulmonary;;   BRONCHIAL BRUSHINGS  05/07/2021   Procedure: BRONCHIAL BRUSHINGS;  Surgeon: Robin Seeds, MD;  Location: [facility] ENDOSCOPY;  Service: Pulmonary;;   BRONCHIAL NEEDLE ASPIRATION BIOPSY  05/07/2021   Procedure: BRONCHIAL NEEDLE ASPIRATION BIOPSIES;  Surgeon: Robin Seeds, MD;  Location: [facility] ENDOSCOPY;  Service: Pulmonary;;   BRONCHIAL WASHINGS  05/07/2021   Procedure: BRONCHIAL WASHINGS;  Surgeon: Robin Seeds, MD;  Location: [place];  Service: Pulmonary;;   COLONOSCOPY WITH PROPOFOL N/A 05/09/2021   Procedure: COLONOSCOPY WITH PROPOFOL;  Surgeon: Robin Artist, MD;  Location: [facility] ENDOSCOPY;  Service: Endoscopy;  Laterality: N/A;   ESOPHAGOGASTRODUODENOSCOPY N/A 05/04/2021   Procedure: ESOPHAGOGASTRODUODENOSCOPY (EGD);  Surgeon: Robin Estrada., MD;  Location: [place];  Service: Gastroenterology;  Laterality: N/A;   HEMOSTASIS CONTROL  05/07/2021   Procedure: HEMOSTASIS CONTROL;  Surgeon: Robin Seeds, MD;  Location: [place];  Service: Pulmonary;;   LAPAROSCOPIC PARTIAL RIGHT COLECTOMY N/A 06/29/2021   Procedure: LAPAROSCOPIC PARTIAL RIGHT COLECTOMY WITH END ILEOSTOMY;  Surgeon: Robin Ruff, MD;  Location: [facility];  Service: General;  Laterality: N/A;   MASS EXCISION Left 08/14/2017   Procedure: PARTIAL EXCISION  LEFT BREAST  MASS ERAS PATHWAY;  Surgeon: Robin Skates, MD;  Location: [place];  Service: General;  Laterality:  Left;   MASTECTOMY W/ SENTINEL NODE BIOPSY Left 09/14/2017   Procedure: LEFT TOTAL MASTECTOMY WITH SENTINEL LYMPH NODE BIOPSY;  Surgeon: Robin Skates, MD;  Location: [place];  Service: General;  Laterality: Left;   NO PAST SURGERIES     POLYPECTOMY  05/09/2021   Procedure: POLYPECTOMY;  Surgeon: Robin Artist, MD;  Location: [facility] ENDOSCOPY;  Service: Endoscopy;;   SUBMUCOSAL TATTOO INJECTION  05/09/2021   Procedure: SUBMUCOSAL TATTOO INJECTION;  Surgeon: Robin Artist, MD;  Location: [place];  Service: Endoscopy;;   VIDEO BRONCHOSCOPY WITH ENDOBRONCHIAL ULTRASOUND N/A 05/07/2021   Procedure: VIDEO BRONCHOSCOPY WITH ENDOBRONCHIAL ULTRASOUND;  Surgeon: Robin Seeds, MD;  Location: [place];  Service: Pulmonary;  Laterality: N/A;    Allergies: Statins, Amlodipine, Clonidine derivatives, Losartan, Sulfa antibiotics, and Sulfamethoxazole-trimethoprim  Medications: Prior to Admission medications   Medication Sig Start Date End Date Taking? Authorizing Provider  acetaminophen (TYLENOL) 500 MG tablet Take 1 tablet (500 mg total) by mouth every 8 (eight) hours as needed for headache. 05/26/21  Yes Robin Lose, MD  atenolol (TENORMIN) 100 MG tablet Take 1 tablet (100 mg total) by mouth 2 (two) times daily. 06/05/21  Yes Estrada, Robin Lacks, MD  cholecalciferol (VITAMIN D) 1000 units tablet Take 1 tablet (1,000 Units total) by mouth daily. 08/01/16  Yes Estrada, Robin Lacks, MD  Multiple Vitamin (MULTIVITAMIN WITH MINERALS) TABS tablet Take 1 tablet by mouth daily. 05/27/21  Yes Robin Lose, MD  ondansetron (ZOFRAN) 4 MG tablet Take 1 tablet (4 mg total) by mouth every 8 (eight) hours as needed for nausea. 06/10/21  Yes Estrada, Robin Lacks, MD  pantoprazole (PROTONIX) 40 MG tablet Take 1 tablet (40 mg total) by mouth 2 (two) times daily before a meal. 05/26/21  Yes Robin Lose, MD  senna-docusate (SENOKOT-S) 8.6-50 MG tablet Take 1-2 tablets by mouth daily as needed for  mild constipation. 06/10/21 06/10/22 Yes Estrada, Robin Lacks, MD  vitamin B-12 1000 MCG tablet Take 1 tablet (1,000 mcg total) by mouth daily. 05/27/21  Yes Robin Lose, MD  fentaNYL (DURAGESIC) 12 MCG/HR Place 1 patch onto the skin every 3 (three) days. Patient not taking: Reported on 06/29/2021 06/10/21   Estrada, Robin Lacks, MD     Family History  Problem Relation Age of Onset   Stroke Mother    Stroke Maternal Grandmother    Breast cancer Sister     Social History   Socioeconomic History   Marital status: Divorced    Spouse name: Not on file   Number of children: 2   Years of education: Not on file   Highest education level: Not on file  Occupational History   Not on file  Tobacco Use   Smoking status: Never   Smokeless tobacco: Never  Vaping Use   Vaping Use: Never used  Substance and Sexual Activity   Alcohol use: Yes    Alcohol/week: 0.0 standard drinks    Comment: OCC   Drug use: No   Sexual activity: Not on file  Other Topics Concern   Not on file  Social History Narrative   02-15-18 Unable to ask abuse questions son with her today.   Social Determinants of Health  Financial Resource Strain: Not on file  Food Insecurity: Not on file  Transportation Needs: Not on file  Physical Activity: Not on file  Stress: Not on file  Social Connections: Not on file    Review of Systems: A 12 point ROS discussed and pertinent positives are indicated in the HPI above.  All other systems are negative.  Vital Signs: BP 118/66 (BP Location: Right Arm)    Pulse (!) 110    Temp 97.7 F (36.5 C) (Oral)    Resp 16    Ht 5' (1.524 m)    Wt 134 lb 14.7 oz (61.2 kg)    SpO2 96%    BMI 26.35 kg/m   Physical Exam Constitutional:      Appearance: She is ill-appearing.  HENT:     Head: Normocephalic.     Mouth/Throat:     Pharynx: Oropharynx is clear.  Eyes:     Extraocular Movements: Extraocular movements intact.  Cardiovascular:     Rate and Rhythm: Tachycardia  present.     Heart sounds: Normal heart sounds.  Pulmonary:     Effort: Pulmonary effort is normal. No respiratory distress.     Breath sounds: Normal breath sounds.  Skin:    General: Skin is warm and dry.  Neurological:     General: No focal deficit present.     Mental Status: She is alert.    Imaging: DG Chest 2 View  Result Date: 07/17/2021 CLINICAL DATA:  Follow-up pneumothorax. History of breast cancer and lung cancer EXAM: CHEST - 2 VIEW COMPARISON:  07/14/2021 FINDINGS: Interval progression of right pleural effusion and right lower lobe consolidation. Negative for pneumothorax.  Left lung remains clear. IMPRESSION: Progression of opacity in the right lung base compatible with right pleural effusion and right lower lobe consolidation. Electronically Signed   By: Franchot Gallo M.D.   On: 07/17/2021 09:52   DG Chest 2 View  Result Date: 07/14/2021 CLINICAL DATA:  Pneumonia. EXAM: CHEST - 2 VIEW COMPARISON:  06/29/2021 and older studies. FINDINGS: There is new opacity at the right lung base obscuring the hemidiaphragm, which may in part be due to elevation of the right hemidiaphragm, associated with a small pleural effusion. Right mid lung mass is poorly defined. Left lung is clear.  No left pleural effusion. No pneumothorax. No residual support apparatus. IMPRESSION: 1. New opacity has developed at the right lung base consistent with a combination of pleural fluid and probable atelectasis, with pneumonia possible, along with elevation of the right hemidiaphragm. Right lung mass, best appreciated on the CT dated 05/03/2021, is not well-defined on the frontal view, better appreciated on the lateral view, without convincing change. Electronically Signed   By: Lajean Manes M.D.   On: 07/14/2021 10:34   DG Knee 1-2 Views Right  Result Date: 07/07/2021 CLINICAL DATA:  Recent fall.  RIGHT knee pain. EXAM: RIGHT KNEE - 1-2 VIEW COMPARISON:  None. FINDINGS: No evidence of fracture, dislocation, or  joint effusion. No evidence of arthropathy or other focal bone abnormality. Soft tissues are unremarkable. IMPRESSION: Negative. Electronically Signed   By: Nolon Nations M.D.   On: 07/07/2021 10:34   MR THORACIC SPINE W WO CONTRAST  Result Date: 07/10/2021 CLINICAL DATA:  Compression fracture EXAM: MRI THORACIC AND LUMBAR SPINE WITHOUT AND WITH CONTRAST TECHNIQUE: Multiplanar and multiecho pulse sequences of the thoracic and lumbar spine were obtained without and with intravenous contrast. CONTRAST:  6.25mL GADAVIST GADOBUTROL 1 MMOL/ML IV SOLN COMPARISON:  05/15/2021 FINDINGS:  MRI THORACIC SPINE FINDINGS Alignment:  Physiologic. Vertebrae: There are metastatic lesions at all vertebral levels. The largest lesions are at T5, T8 and T11. At T11 there is a compression deformity with 50% height loss and retropulsion measuring 4 mm. Height loss has progressed. Cord:  Normal signal and morphology. Paraspinal and other soft tissues: 3.7 cm area of peripheral enhancement at the right lung base. Disc levels: Mild spinal canal stenosis at the T11 level. Spinal canal is otherwise widely patent. Mild contrast enhancement of the ventral epidural space at the T11 level is unchanged. MRI LUMBAR SPINE FINDINGS Segmentation:  Standard. Alignment:  Physiologic. Vertebrae: Metastatic lesions at all levels, worst at L3 and L4. At L4, there is an area ventral epidural contrast enhancement. No clear compression fracture. Conus medullaris: Extends to the L1 level and appears normal. Paraspinal and other soft tissues: Negative. Disc levels: L1-L2: Normal disc space and facet joints. No spinal canal stenosis. No neural foraminal stenosis. L2-L3: Normal disc space and facet joints. No spinal canal stenosis. No neural foraminal stenosis. L3-L4: Left asymmetric disc bulge. Left lateral recess narrowing without central spinal canal stenosis. No neural foraminal stenosis. L4-L5: Small disc bulge. Abnormal ventral epidural enhancement. No  spinal canal stenosis. No neural foraminal stenosis. L5-S1: Normal disc space and facet joints. No spinal canal stenosis. No neural foraminal stenosis. Visualized sacrum: Normal. IMPRESSION: 1. Diffuse osseous metastatic disease throughout the thoracic and lumbar spine. 2. Progressive height loss of T11 pathologic fracture with unchanged thin epidural enhancement, possibly epidural tumor extension. 3. Abnormal ventral epidural enhancement at L4 is consistent with epidural spread of tumor. No associated stenosis. 4. 3.7 cm right lung mass Electronically Signed   By: Ulyses Jarred M.D.   On: 07/10/2021 23:12   MR Lumbar Spine W Wo Contrast  Result Date: 07/10/2021 CLINICAL DATA:  Compression fracture EXAM: MRI THORACIC AND LUMBAR SPINE WITHOUT AND WITH CONTRAST TECHNIQUE: Multiplanar and multiecho pulse sequences of the thoracic and lumbar spine were obtained without and with intravenous contrast. CONTRAST:  6.1mL GADAVIST GADOBUTROL 1 MMOL/ML IV SOLN COMPARISON:  05/15/2021 FINDINGS: MRI THORACIC SPINE FINDINGS Alignment:  Physiologic. Vertebrae: There are metastatic lesions at all vertebral levels. The largest lesions are at T5, T8 and T11. At T11 there is a compression deformity with 50% height loss and retropulsion measuring 4 mm. Height loss has progressed. Cord:  Normal signal and morphology. Paraspinal and other soft tissues: 3.7 cm area of peripheral enhancement at the right lung base. Disc levels: Mild spinal canal stenosis at the T11 level. Spinal canal is otherwise widely patent. Mild contrast enhancement of the ventral epidural space at the T11 level is unchanged. MRI LUMBAR SPINE FINDINGS Segmentation:  Standard. Alignment:  Physiologic. Vertebrae: Metastatic lesions at all levels, worst at L3 and L4. At L4, there is an area ventral epidural contrast enhancement. No clear compression fracture. Conus medullaris: Extends to the L1 level and appears normal. Paraspinal and other soft tissues: Negative.  Disc levels: L1-L2: Normal disc space and facet joints. No spinal canal stenosis. No neural foraminal stenosis. L2-L3: Normal disc space and facet joints. No spinal canal stenosis. No neural foraminal stenosis. L3-L4: Left asymmetric disc bulge. Left lateral recess narrowing without central spinal canal stenosis. No neural foraminal stenosis. L4-L5: Small disc bulge. Abnormal ventral epidural enhancement. No spinal canal stenosis. No neural foraminal stenosis. L5-S1: Normal disc space and facet joints. No spinal canal stenosis. No neural foraminal stenosis. Visualized sacrum: Normal. IMPRESSION: 1. Diffuse osseous metastatic disease throughout the thoracic  and lumbar spine. 2. Progressive height loss of T11 pathologic fracture with unchanged thin epidural enhancement, possibly epidural tumor extension. 3. Abnormal ventral epidural enhancement at L4 is consistent with epidural spread of tumor. No associated stenosis. 4. 3.7 cm right lung mass Electronically Signed   By: Ulyses Jarred M.D.   On: 07/10/2021 23:12   CT ABDOMEN PELVIS W CONTRAST  Result Date: 07/09/2021 CLINICAL DATA:  Abdominal pain. Postoperative. History of lung cancer. EXAM: CT ABDOMEN AND PELVIS WITH CONTRAST TECHNIQUE: Multidetector CT imaging of the abdomen and pelvis was performed using the standard protocol following bolus administration of intravenous contrast. RADIATION DOSE REDUCTION: This exam was performed according to the departmental dose-optimization program which includes automated exposure control, adjustment of the mA and/or kV according to patient size and/or use of iterative reconstruction technique. CONTRAST:  160mL OMNIPAQUE IOHEXOL 300 MG/ML  SOLN COMPARISON:  Pelvis and right hip radiographs 07/07/2021 CT abdomen and pelvis 06/28/2021 FINDINGS: Lower chest: New mild-to-moderate right and mild left pleural effusions with associated subsegmental atelectasis. Unchanged curvilinear scarring within the medial middle lobe and  anteromedial lingula. Coronary artery calcifications are noted. Mild cardiomegaly, unchanged. Hepatobiliary: There are numerous low-density lesions seen throughout the liver. Most of these are too small to further characterize however the largest within the right hepatic lobe measures approximally 14 mm (axial series 2, image 16) and demonstrates density greater than fluid suspicious for a metastasis. The gallbladder is unremarkable. No intrahepatic or extrahepatic biliary ductal dilatation. Pancreas: No significant change in fluid density cyst within the pancreatic tail measuring up to 2.4 x 1.9 cm. No pancreatic ductal dilatation is seen. Spleen: Normal in size without focal abnormality. Adrenals/Urinary Tract: Adrenal glands are unremarkable. The kidneys enhance uniformly and are symmetric in size without hydronephrosis. No renal stone is seen. Multiple subcentimeter low-attenuation lesions too small to further characterize. A left lower pole 12 mm low-attenuation lesion is unchanged from prior and compatible with a cyst but not well evaluated on the current study. No focal urinary bladder wall thickening. Mild nondependent air within the urinary bladder lumen. Recommend clinical correlation for recent instrumentation which would explain this air. Stomach/Bowel: Postsurgical changes are seen of resection of the ascending colon. The more distal colon is diffusely decompressed. There is a right upper quadrant ventral ostomy. There is a new left hemiabdomen small bowel intussusception along an approximate 6 cm length (coronal series 6, image 45, axial series 2 images 42 through 49). Oral contrast does mildly extend past this point. The more proximal small bowel is mildly distended up to 2.6 cm in the more distal small bowel is decompressed. Vascular/Lymphatic: No abdominal aortic aneurysm. Moderate atherosclerotic calcifications. Normal variant retroaortic left renal vein. No mesenteric, retroperitoneal, or pelvic  lymphadenopathy. Reproductive: The uterus is present. Mild uterine heterogeneity bubbly uterine fibroids. No ovarian mass is identified. Other: Small volume ascites.No pneumoperitoneum. New moderate anasarca. Musculoskeletal: High-grade T11 vertebral body height loss unchanged. Innumerable lytic lesions are seen throughout the visualized spine and pelvis. This includes unchanged dominant left ilium lesion measuring up to 2.9 cm in transverse dimension (axial series 2, image 59) and dominant lytic lesions involving the mid and posterior aspect of the L4 vertebral body (sagittal images 71 through 76). IMPRESSION: Compared to 06/28/2021: 1. Interval resection of the ascending colon region of prior intussusception and new right upper quadrant ileostomy. 2. There is a new short-segment intussusception seen within small bowel within the left hemiabdomen with mild upstream bowel dilatation. Oral contrast appears to extend through this  region. 3. Numerous low-density lesions are seen throughout the liver suspicious for metastatic disease. 4. New mild-to-moderate right and mild left pleural effusions. 5. Innumerable osseous metastases. Electronically Signed   By: Yvonne Kendall M.D.   On: 07/09/2021 20:28   CT ABDOMEN PELVIS W CONTRAST  Result Date: 06/28/2021 CLINICAL DATA:  Abdominal pain and nausea x5 days. EXAM: CT ABDOMEN AND PELVIS WITH CONTRAST TECHNIQUE: Multidetector CT imaging of the abdomen and pelvis was performed using the standard protocol following bolus administration of intravenous contrast. RADIATION DOSE REDUCTION: This exam was performed according to the departmental dose-optimization program which includes automated exposure control, adjustment of the mA and/or kV according to patient size and/or use of iterative reconstruction technique. CONTRAST:  183mL OMNIPAQUE IOHEXOL 300 MG/ML  SOLN COMPARISON:  May 03, 2021 FINDINGS: Lower chest: No acute abnormality. Hepatobiliary: No focal liver  abnormality is seen. No gallstones, gallbladder wall thickening, or biliary dilatation. Pancreas: A stable 2.5 cm x 1.8 cm cystic appearing structure is seen within the tail of the pancreas (axial CT image 27, CT series 2). Spleen: Normal in size without focal abnormality. Adrenals/Urinary Tract: Adrenal glands are unremarkable. Kidneys are normal, without renal calculi or hydronephrosis. Multiple subcentimeter simple cysts are seen within both kidneys. The largest measures approximately 12 mm in diameter. Bladder is unremarkable. Stomach/Bowel: Stomach is within normal limits. The appendix is poorly visualized. There is intussusception of the cecum and distal ileum within the length of the ascending colon (axial CT images 38 through 54, CT series number 2). Subsequent dilatation of the ascending colon is seen (approximately 5.1 cm). The visualized loops of small bowel are normal in caliber. Vascular/Lymphatic: Aortic atherosclerosis. No enlarged abdominal or pelvic lymph nodes. Reproductive: Small heterogeneous uterine fibroids are seen within the body of the uterus on the right. The bilateral adnexa are unremarkable. Other: No abdominal wall hernia or abnormality. No abdominopelvic ascites. Musculoskeletal: A compression fracture deformity is seen at the level of T11 with marked severity interval decrease in vertebral body height since the prior study. An expansile lytic lesion and associated pathological fracture is seen involving the anterolateral aspect of the fifth right rib (axial CT image 1, CT series 2). Numerous lytic lesions of various sizes are seen scattered throughout the thoracolumbar spine and pelvis. IMPRESSION: 1. Intussusception involving the cecum and distal ileum within the length of the ascending colon, with subsequent bowel obstruction. 2. Numerous lytic lesions of various sizes scattered throughout the thoracolumbar spine and pelvis, consistent with osseous metastatic disease. 3. Pathologic  fracture deformity at the level of T11 with marked severity interval decrease in vertebral body height since the prior study. 4. Stable cystic lesion within the tail of the pancreas. 5. Small heterogeneous uterine fibroids. 6. Aortic atherosclerosis. Aortic Atherosclerosis (ICD10-I70.0). Electronically Signed   By: Virgina Norfolk M.D.   On: 06/28/2021 19:41   DG Chest Port 1 View  Result Date: 06/29/2021 CLINICAL DATA:  Status post code blue, concern for pneumothorax EXAM: PORTABLE CHEST 1 VIEW COMPARISON:  06/29/2021 FINDINGS: Right neck vascular catheter. The heart size and mediastinal contours are within normal limits. Lobulated mass projects over the right midlung. No significant pneumothorax. No acute airspace opacity. Small volume pneumoperitoneum underlying the right hemidiaphragm. The visualized skeletal structures are unremarkable. IMPRESSION: 1. No significant pneumothorax or obvious displaced rib fracture in single AP portable view. 2. Small volume pneumoperitoneum underlying the right hemidiaphragm. These results will be called to the ordering clinician or representative by the Radiologist Assistant, and communication documented  in the PACS or [employer]. Electronically Signed   By: Delanna Ahmadi M.D.   On: 06/29/2021 13:47   DG Chest Port 1 View  Result Date: 06/29/2021 CLINICAL DATA:  Central line placement. EXAM: PORTABLE CHEST 1 VIEW COMPARISON:  05/14/2021 FINDINGS: 1212 hours. Right IJ central line tip overlies the distal SVC level near the expected junction with the RA. No evidence for pneumothorax. Right pulmonary lesion again noted. Left lung clear. Telemetry leads overlie the chest. IMPRESSION: Right IJ central line tip overlies the distal SVC level. No pneumothorax. Electronically Signed   By: Misty Stanley M.D.   On: 06/29/2021 12:38   ECHOCARDIOGRAM COMPLETE  Result Date: 06/30/2021    ECHOCARDIOGRAM REPORT   Patient Name:   NADRA HRITZ Date of Exam: 06/30/2021 Medical  Rec #:  [id number]      Height:       60.0 in Accession #:    [id number]     Weight:       121.3 lb Date of Birth:  1946-04-29      BSA:          1.509 m Patient Age:    39 years       BP:           130/48 mmHg Patient Gender: F              HR:           79 bpm. Exam Location:  Inpatient Procedure: 2D Echo, Cardiac Doppler and Color Doppler Indications:    R07.9 CHEST PAIN  History:        Patient has prior history of Echocardiogram examinations, most                 recent 07/21/2017. Signs/Symptoms:Murmur; Risk                 Factors:Hypertension. MVP.  Sonographer:    Beryle Beams Referring Phys: [id number] Alphonse Guild BRANCH  Sonographer Comments: No subcostal window. POST OP ABDOMEN IMPRESSIONS  1. Mild inferoseptal hypokinesis. Left ventricular ejection fraction, by estimation, is 50%. The left ventricle has low normal function. The left ventricle demonstrates regional wall motion abnormalities (see scoring diagram/findings for description). There is mild left ventricular hypertrophy. Left ventricular diastolic parameters are indeterminate. Elevated left atrial pressure.  2. Right ventricular systolic function is normal. The right ventricular size is normal. Tricuspid regurgitation signal is inadequate for assessing PA pressure.  3. The mitral valve is abnormal. Mild to moderate mitral valve regurgitation. No evidence of mitral stenosis.  4. The tricuspid valve is abnormal.  5. The aortic valve has an indeterminant number of cusps. Aortic valve regurgitation is mild. No aortic stenosis is present. FINDINGS  Left Ventricle: Mild inferoseptal hypokinesis. Left ventricular ejection fraction, by estimation, is 50%. The left ventricle has low normal function. The left ventricle demonstrates regional wall motion abnormalities. The left ventricular internal cavity size was normal in size. There is mild left ventricular hypertrophy. Left ventricular diastolic parameters are indeterminate. Elevated left atrial pressure.  Right Ventricle: The right ventricular size is normal. No increase in right ventricular wall thickness. Right ventricular systolic function is normal. Tricuspid regurgitation signal is inadequate for assessing PA pressure. Left Atrium: Left atrial size was normal in size. Right Atrium: Right atrial size was normal in size. Pericardium: The pericardium was not well visualized. Mitral Valve: The mitral valve is abnormal. There is mild thickening of the mitral valve leaflet(s). There is mild calcification of the mitral  valve leaflet(s). Mild mitral annular calcification. Mild to moderate mitral valve regurgitation. No evidence of mitral valve stenosis. MV peak gradient, 129.0 mmHg. The mean mitral valve gradient is 88.0 mmHg. Tricuspid Valve: The tricuspid valve is abnormal. Tricuspid valve regurgitation is mild . No evidence of tricuspid stenosis. Aortic Valve: The aortic valve has an indeterminant number of cusps. Aortic valve regurgitation is mild. Aortic regurgitation PHT measures 600 msec. No aortic stenosis is present. Aortic valve mean gradient measures 9.3 mmHg. Aortic valve peak gradient measures 20.0 mmHg. Aortic valve area, by VTI measures 1.54 cm. Pulmonic Valve: The pulmonic valve was not well visualized. Pulmonic valve regurgitation is trivial. No evidence of pulmonic stenosis. Aorta: The aortic root is normal in size and structure. IAS/Shunts: The interatrial septum was not well visualized.  LEFT VENTRICLE PLAX 2D LVIDd:         4.50 cm     Diastology LVIDs:         3.40 cm     LV e' medial:    5.55 cm/s LV PW:         1.10 cm     LV E/e' medial:  18.9 LV IVS:        1.00 cm     LV e' lateral:   5.87 cm/s LVOT diam:     1.90 cm     LV E/e' lateral: 17.9 LV SV:         58 LV SV Index:   39 LVOT Area:     2.84 cm  LV Volumes (MOD) LV vol d, MOD A2C: 42.5 ml LV vol d, MOD A4C: 49.0 ml LV vol s, MOD A2C: 24.4 ml LV vol s, MOD A4C: 28.0 ml LV SV MOD A2C:     18.1 ml LV SV MOD A4C:     49.0 ml LV SV MOD BP:       19.2 ml RIGHT VENTRICLE RV S prime:     12.40 cm/s RVOT diam:      2.00 cm TAPSE (M-mode): 1.9 cm LEFT ATRIUM             Index        RIGHT ATRIUM           Index LA diam:        3.70 cm 2.45 cm/m   RA Area:     11.50 cm LA Vol (A2C):   49.9 ml 33.07 ml/m  RA Volume:   24.70 ml  16.37 ml/m LA Vol (A4C):   35.5 ml 23.53 ml/m LA Biplane Vol: 44.4 ml 29.42 ml/m  AORTIC VALVE                     PULMONIC VALVE AV Area (Vmax):    1.16 cm      PV Vmax:       0.58 m/s AV Area (Vmean):   1.19 cm      PV Vmean:      37.800 cm/s AV Area (VTI):     1.54 cm      PV VTI:        0.125 m AV Vmax:           223.69 cm/s   PV Peak grad:  1.3 mmHg AV Vmean:          147.413 cm/s  PV Mean grad:  1.0 mmHg AV VTI:            0.379 m AV Peak Grad:  20.0 mmHg AV Mean Grad:      9.3 mmHg LVOT Vmax:         91.60 cm/s LVOT Vmean:        61.800 cm/s LVOT VTI:          0.206 m LVOT/AV VTI ratio: 0.54 AI PHT:            600 msec  AORTA Ao Root diam: 2.50 cm Ao Asc diam:  2.50 cm MITRAL VALVE                TRICUSPID VALVE MV Area (PHT): 1.63 cm     TV Peak grad:   32.5 mmHg MV Peak grad:  129.0 mmHg   TV Mean grad:   24.0 mmHg MV Mean grad:  88.0 mmHg    TV Vmax:        2.85 m/s MV Vmax:       5.68 m/s     TV Vmean:       232.0 cm/s MV Vmean:      448.0 cm/s   TV VTI:         0.93 msec MV Decel Time: 465 msec MV E velocity: 105.00 cm/s  SHUNTS MV A velocity: 111.00 cm/s  Systemic VTI:  0.21 m MV E/A ratio:  0.95         Systemic Diam: 1.90 cm                             Pulmonic Diam: 2.00 cm Carlyle Dolly MD Electronically signed by Carlyle Dolly MD Signature Date/Time: 06/30/2021/12:02:35 PM    Final    DG HIP UNILAT WITH PELVIS 2-3 VIEWS RIGHT  Result Date: 07/07/2021 CLINICAL DATA:  Recent fall.  RIGHT hip and knee pain. EXAM: DG HIP (WITH OR WITHOUT PELVIS) 2-3V RIGHT COMPARISON:  None. FINDINGS: There is no acute fracture or subluxation. Degenerative changes are seen in both hips and in the lumbosacral region.  There is diffuse body wall edema. Surgical clips overlie the abdomen and pelvis consistent with recent abdominal surgery. IMPRESSION: No acute fracture or subluxation. Electronically Signed   By: Nolon Nations M.D.   On: 07/07/2021 10:32   VAS [place] LOWER EXTREMITY VENOUS (DVT)  Result Date: 07/04/2021  Lower Venous DVT Study Patient Name:  BERMA HARTS  Date of Exam:   07/04/2021 Medical Rec #: [id number]       Accession #:    [id number] Date of Birth: 10/20/1945       Patient Gender: F Patient Age:   4 years Exam Location:  [facility] Procedure:      VAS [place] LOWER EXTREMITY VENOUS (DVT) Referring Phys: Jana Half [facility] --------------------------------------------------------------------------------  Indications: Edema.  Limitations: Poor ultrasound/tissue interface. Comparison Study: No prior study Performing Technologist: Maudry Mayhew MHA, RDMS, RVT, RDCS  Examination Guidelines: A complete evaluation includes B-mode imaging, spectral Doppler, color Doppler, and power Doppler as needed of all accessible portions of each vessel. Bilateral testing is considered an integral part of a complete examination. Limited examinations for reoccurring indications may be performed as noted. The reflux portion of the exam is performed with the patient in reverse Trendelenburg.  +---------+---------------+---------+-----------+----------+--------------+  RIGHT     Compressibility Phasicity Spontaneity Properties Thrombus Aging  +---------+---------------+---------+-----------+----------+--------------+  CFV       Full            Yes       Yes                                    +---------+---------------+---------+-----------+----------+--------------+  SFJ       Full                                                             +---------+---------------+---------+-----------+----------+--------------+  FV Prox   Full                                                              +---------+---------------+---------+-----------+----------+--------------+  FV Mid    Full                                                             +---------+---------------+---------+-----------+----------+--------------+  FV Distal Full                                                             +---------+---------------+---------+-----------+----------+--------------+  PFV       Full                                                             +---------+---------------+---------+-----------+----------+--------------+  POP       Full            Yes       Yes                                    +---------+---------------+---------+-----------+----------+--------------+  PTV       Full                                                             +---------+---------------+---------+-----------+----------+--------------+  PERO      Full                                                             +---------+---------------+---------+-----------+----------+--------------+   +---------+---------------+---------+-----------+----------+--------------+  LEFT      Compressibility Phasicity Spontaneity Properties Thrombus Aging  +---------+---------------+---------+-----------+----------+--------------+  CFV       Full            Yes       Yes                                    +---------+---------------+---------+-----------+----------+--------------+  SFJ       Full                                                             +---------+---------------+---------+-----------+----------+--------------+  FV Prox   Full                                                             +---------+---------------+---------+-----------+----------+--------------+  FV Mid    Full                                                             +---------+---------------+---------+-----------+----------+--------------+  FV Distal Full                                                              +---------+---------------+---------+-----------+----------+--------------+  PFV       Full                                                             +---------+---------------+---------+-----------+----------+--------------+  POP       Full            Yes       Yes                                    +---------+---------------+---------+-----------+----------+--------------+  PTV       None                      No                     Acute           +---------+---------------+---------+-----------+----------+--------------+  PERO      Full                                                             +---------+---------------+---------+-----------+----------+--------------+     Summary: RIGHT: - There is no evidence of deep vein thrombosis in the lower extremity.  - No cystic structure found in the popliteal fossa.  LEFT: - Findings consistent with acute deep vein thrombosis involving the left posterior tibial veins. - No cystic structure found in the popliteal fossa.  *See table(s) above for measurements and observations. Electronically signed  by Orlie Pollen on 07/04/2021 at 6:13:47 PM.    Final     Labs:  CBC: Recent Labs    07/14/21 0911 07/15/21 0500 07/16/21 0518 07/17/21 0518  WBC 18.9* 20.2* 21.5* 25.6*  HGB 9.1* 8.3* 8.1* 9.3*  HCT 29.5* 27.1* 27.0* 32.7*  PLT 299 304 308 326    COAGS: Recent Labs    06/30/21 1443 07/10/21 0924 07/10/21 2336 07/11/21 0817 07/12/21 0543 07/12/21 1658  INR 1.3*  --   --   --  1.2  --   APTT 49*   < > 98* 86* 102* 82*   < > = values in this interval not displayed.    BMP: Recent Labs    07/14/21 0911 07/15/21 0500 07/16/21 0518 07/17/21 0518  NA 131* 133* 131* 134*  K 4.0 3.7 3.5 4.0  CL 99 100 99 101  CO2 24 25 24 22   GLUCOSE 139* 129* 120* 117*  BUN 12 15 10 10   CALCIUM 8.7* 8.9 8.9 9.6  CREATININE 0.42* 0.45 0.43* 0.57  GFRNONAA >60 >60 >60 >60    LIVER FUNCTION TESTS: Recent Labs    07/14/21 0911 07/15/21 0500  07/16/21 0518 07/17/21 0518  BILITOT 0.5 0.3 0.2* 0.3  AST 26 25 29  36  ALT 12 13 15 20   ALKPHOS 229* 241* 308* 396*  PROT 5.2* 5.2* 4.9* 6.0*  ALBUMIN 1.6* 1.7* 1.6* 1.9*    Assessment and Plan:  Metastatic bone lesions --for osteocool ablation and KP/VP --  Risks and benefits of multilevel osteocool ablation and VP/KP were discussed with the patient and son including, but not limited to education regarding the natural healing process of compression fractures without intervention, bleeding, infection, cement migration which may cause spinal cord damage, paralysis, pulmonary embolism or even death.  This interventional procedure involves the use of X-rays and because of the nature of the planned procedure, it is possible that we will have prolonged use of X-ray fluoroscopy.  Potential radiation risks to you include (but are not limited to) the following: - A slightly elevated risk for cancer  several years later in life. This risk is typically less than 0.5% percent. This risk is low in comparison to the normal incidence of human cancer, which is 33% for women and 50% for men according to the [employer]. - Radiation induced injury can include skin redness, resembling a rash, tissue breakdown / ulcers and hair loss (which can be temporary or permanent).   The likelihood of either of these occurring depends on the difficulty of the procedure and whether you are sensitive to radiation due to previous procedures, disease, or genetic conditions.   IF your procedure requires a prolonged use of radiation, you will be notified and given written instructions for further action.  It is your responsibility to monitor the irradiated area for the 2 weeks following the procedure and to notify your physician if you are concerned that you have suffered a radiation induced injury.    All of the patient's questions were answered, patient is agreeable to proceed.  Consent signed and in  IR.    Thank you for this interesting consult.  I greatly enjoyed meeting Robin Estrada and look forward to participating in their care.  A copy of this report was sent to the requesting provider on this date.  Electronically Signed: Pasty Spillers, PA 07/17/2021, 4:37 PM   I spent a total of 40 Minutes  in face to face in clinical consultation, greater than  50% of which was counseling/coordinating care for osteocool ablation.

## 2021-07-17 NOTE — Progress Notes (Signed)
Pharmacy Brief Note - Evening Anticoagulation Follow Up: ? ?Patient is a 62 yoF on heparin drip while apixaban is on hold for planned procedure. For full history, see note by Peggyann Juba, PharmD from earlier today.  ? ?Assessment: ?Confirmatory HL = 0.32 is therapeutic on heparin infusion of 1350 units/hr ?No complications of therapy noted ? ?   ?Plan: ?Continue heparin infusion at current rate of 1350 units/hr ?HL, CBC daily with AM labs ?Monitor for signs of bleeding ?Heparin to stop at 0800 on 3/9. Stop time entered. ? ?Everette Rank, PharmD ?07/17/21 ?11:10 PM ? ?

## 2021-07-17 NOTE — Progress Notes (Signed)
Physical Therapy Treatment ?Patient Details ?Name: Robin Estrada ?MRN: [id number] ?DOB: 12-06-1945 ?Today's Date: 07/17/2021 ? ? ?History of Present Illness 76 year old [name] with PMH significant of lung cancer, history of breast cancer, new vertebral masses presents in the ER with several days history of worsening abdominal pain, and nausea. CT abdomen with IV contrast showed large intussusception of the cecum and distal ileum within the length of the ascending colon and subsequent dilatation of the ascending colon. There are also noted various and numerous lytic lesions throughout the thoracolumbar spine and pelvis consistent with metastatic disease. General surgery was consulted.  She underwent laparoscopic right colectomy with end ileostomy on 06/29/2021.  She had a post-op cardiac arrest and inferior STEMI-- Cardiology consulted, Not a candidate for cardiac intervention ? ?  ?PT Comments  ? ? Pt was agreeable to attempt standing today.  She required MAX A of 2.  Recommend d/c plan be changed to SNF at this time.   ?Recommendations for follow up therapy are one component of a multi-disciplinary discharge planning process, led by the attending physician.  Recommendations may be updated based on patient status, additional functional criteria and insurance authorization. ? ?Follow Up Recommendations ? Skilled nursing-short term rehab (<3 hours/day) ?  ?  ?Assistance Recommended at Discharge Frequent or constant Supervision/Assistance  ?Patient can return home with the following A lot of help with walking and/or transfers;A lot of help with bathing/dressing/bathroom;Assist for transportation;Help with stairs or ramp for entrance;Assistance with cooking/housework ?  ?Equipment Recommendations ? None recommended by PT  ?  ?Recommendations for Other Services   ? ? ?  ?Precautions / Restrictions Precautions ?Precautions: Fall ?Precaution Comments: R LQ ileostomy, ?Restrictions ?Weight Bearing Restrictions: No  ?   ? ?Mobility ? Bed Mobility ?  ?  ?  ?  ?  ?  ?  ?General bed mobility comments: up in recliner upon arrival ?  ? ?Transfers ?Overall transfer level: Needs assistance ?Equipment used: Rolling walker (2 wheels) ?Transfers: Sit to/from Stand ?Sit to Stand: Max assist, +2 physical assistance ?  ?  ?  ?  ?  ?General transfer comment: Worked on sit to stands with cues for hand placement, foot placement, how to shift COG over BOS. Attempted to take a step, but unable to.  Stood twice. ?  ? ?Ambulation/Gait ?  ?  ?  ?  ?  ?  ?  ?  ? ? ?Stairs ?  ?  ?  ?  ?  ? ? ?Wheelchair Mobility ?  ? ?Modified Rankin (Stroke Patients Only) ?  ? ? ?  ?Balance   ?  ?  ?  ?  ?Standing balance support: Bilateral upper extremity supported ?Standing balance-Leahy Scale: Zero ?Standing balance comment: Slight retropulsion with initial stand and had to work on forward weight shift. ?  ?  ?  ?  ?  ?  ?  ?  ?  ?  ?  ?  ? ?  ?Cognition Arousal/Alertness: Awake/alert ?Behavior During Therapy: [facility] for tasks assessed/performed ?Overall Cognitive Status: Within Functional Limits for tasks assessed ?  ?  ?  ?  ?  ?  ?  ?  ?  ?  ?  ?  ?  ?  ?  ?  ?General Comments: patients son was present and wanted to translate for patient. ?  ?  ? ?  ?Exercises   ? ?  ?General Comments   ?  ?  ? ?Pertinent Vitals/Pain Pain Assessment ?Pain Assessment:  Faces ?Faces Pain Scale: Hurts even more ?Pain Location: back ?Pain Descriptors / Indicators: Grimacing ?Pain Intervention(s): Limited activity within patient's tolerance, Monitored during session, Repositioned  ? ? ?Home Living   ?  ?  ?  ?  ?  ?  ?  ?  ?  ?   ?  ?Prior Function    ?  ?  ?   ? ?PT Goals (current goals can now be found in the care plan section) Acute Rehab PT Goals ?PT Goal Formulation: With patient/family ?Time For Goal Achievement: 07/31/21 ?Potential to Achieve Goals: Fair ?Progress towards PT goals: Not progressing toward goals - comment;Goals downgraded-see care plan ? ?  ?Frequency ? ? ? Min  2X/week ? ? ? ?  ?PT Plan Discharge plan needs to be updated  ? ? ?Co-evaluation   ?  ?  ?  ?  ? ?  ?AM-PAC PT "6 Clicks" Mobility   ?Outcome Measure ? Help needed turning from your back to your side while in a flat bed without using bedrails?: A Lot ?Help needed moving from lying on your back to sitting on the side of a flat bed without using bedrails?: A Lot ?Help needed moving to and from a bed to a chair (including a wheelchair)?: A Lot ?Help needed standing up from a chair using your arms (e.g., wheelchair or bedside chair)?: A Lot ?Help needed to walk in hospital room?: Total ?Help needed climbing 3-5 steps with a railing? : Total ?6 Click Score: 10 ? ?  ?End of Session Equipment Utilized During Treatment: Gait belt ?Activity Tolerance: Patient limited by fatigue ?Patient left: in chair;with call bell/phone within reach;with chair alarm set;with family/visitor present ?  ?PT Visit Diagnosis: Muscle weakness (generalized) (M62.81);Difficulty in walking, not elsewhere classified (R26.2);Other abnormalities of gait and mobility (R26.89) ?  ? ? ?Time: 4825-0037 ?PT Time Calculation (min) (ACUTE ONLY): 23 min ? ?Charges:  $Therapeutic Activity: 23-37 mins          ?          ? ?Santiago Glad L. Tamala Julian, PT ? ?07/17/2021 ? ? ? ?[name] ?07/17/2021, 2:52 PM ? ?

## 2021-07-17 NOTE — Progress Notes (Signed)
Nutrition Follow-up ? ?INTERVENTION:  ? ?-Prosource Plus PO TID, each provides 100 kcals and 15g protein ? ?-Magic cup BID with meals, each supplement provides 290 kcal and 9 grams of protein  ? ?-Continue Boost and Juven ? ?-Multivitamin with minerals daily ? ?NUTRITION DIAGNOSIS:  ? ?Increased nutrient needs related to cancer and cancer related treatments, post-op healing as evidenced by estimated needs. ? ?Ongoing. ? ?GOAL:  ? ?Patient will meet greater than or equal to 90% of their needs ? ?Progressing. ? ?MONITOR:  ? ?PO intake, Supplement acceptance, Labs, Weight trends ? ?ASSESSMENT:  ? ?76 year old female with medical history of lung cancer, hx of breast cancer, new vertebral masses, HTN, mitral valve prolapse, heart murmur, and tuberculosis. She presented to the ED due to several days of worsening abdominal pain and nausea. CT abdomen showed large intussusception of the cecum and distal ileum within the ascending colon, dilatation of the ascending colon, and various and numerous lytic lesions throughout the thoracolumbar spine and pelvis consistent with metastatic disease. General surgery was consulted.  She underwent laparoscopic right colectomy with end ileostomy on 06/29/2021. She had a post-op cardiac arrest and inferior STEMI-- Cardiology consulted, not a candidate for cardiac intervention. ? ?Patient consuming 25-45% of meals at this time. Not accepting protein supplements at this time. Will add Magic cups to lunch and dinner meals. ? ?Pt awaiting insurance authorization to have kyphoplasty. ? ?Admission weight: 121 lbs ?Current weight: 134 lbs. ? ?Per nursing documentation, pt with mild BLE and moderate LUE edema. ? ?Medications: Remeron, Multivitamin with minerals daily ? ?Labs reviewed: ?Low Na ? ?Diet Order:   ?Diet Order   ? ?       ?  Diet regular Room service appropriate? Yes; Fluid consistency: Thin  Diet effective now       ?  ? ?  ?  ? ?  ? ? ?EDUCATION NEEDS:  ? ?No education needs have been  identified at this time ? ?Skin:  Skin Assessment: Skin Integrity Issues: ?Skin Integrity Issues:: Incisions ?Incisions: abdomen (2/18) ? ?Last BM:  2/28 -ileostomy ? ?Height:  ? ?Ht Readings from Last 1 Encounters:  ?07/07/21 5' (1.524 m)  ? ? ?Weight:  ? ?Wt Readings from Last 1 Encounters:  ?07/15/21 61.2 kg  ? ? ?Ideal Body Weight:  45.45 kg ? ?BMI:  Body mass index is 26.35 kg/m?. ? ?Estimated Nutritional Needs:  ? ?Kcal:  1900-2150 kcal ? ?Protein:  105-120 grams ? ?Fluid:  >/= 2.2 L/day ? ?Clayton Bibles, MS, RD, LDN ?Inpatient Clinical Dietitian ?Contact information available via Amion ? ?

## 2021-07-17 NOTE — Progress Notes (Signed)
ANTICOAGULATION CONSULT NOTE   ? ?Pharmacy Consult for heparin  bridge therapy while apixaban on hold ?Indication: DVT ? ?Allergies  ?Allergen Reactions  ? Statins Nausea And Vomiting and Other (See Comments)  ?  MUSCLE PAIN  ? Amlodipine   ?  fatigue  ? Clonidine Derivatives   ?  headache  ? Losartan   ?  abd pain  ? Sulfa Antibiotics Nausea Only  ? Sulfamethoxazole-Trimethoprim Nausea Only  ?  " Severe Nausea "  ? ? ?Patient Measurements: ?Height: 5' (152.4 cm) ?Weight: 61.2 kg (134 lb 14.7 oz) ?IBW/kg (Calculated) : 45.5 ?Heparin dosing weight: 58 kg ? ? ?Vital Signs: ?Temp: 98.5 ?F (36.9 ?C) (03/08 0439) ?Temp Source: Oral (03/08 0439) ?BP: 128/64 (03/08 0439) ?Pulse Rate: 110 (03/08 0439) ? ?Labs: ?Recent Labs  ?  07/15/21 ?0500 07/16/21 ?0518 07/17/21 ?0518 07/17/21 ?0530  ?HGB 8.3* 8.1* 9.3*  --   ?HCT 27.1* 27.0* 32.7*  --   ?PLT 304 308 326  --   ?HEPARINUNFRC 0.43 0.30  --  0.23*  ?CREATININE 0.45 0.43* 0.57  --   ? ? ? ?Estimated Creatinine Clearance: 48.9 mL/min (by C-G formula based on SCr of 0.57 mg/dL). ? ? ?Medical History: ?Past Medical History:  ?Diagnosis Date  ? Breast mass, left   ? Large  ? Cancer [facility])   ? FIBROMYOMA  AGE 68-   BENIGN  RESOLVED  ? GI bleed 05/03/2021  ? 1/23 continue with pantoprazole  ? Headache   ? Heart murmur   ? Hypertension   ? Mitral valve prolapse   ? Tuberculosis   ? AS CHILD  W/ TX  ? ? ?Medications:  ?No anticoagulants PTA  ? ?Assessment: ?76 y/o female with PMH of metastatic breast cancer who is post-op lap R colectomy, end ileostomy.  Experienced STEMI and cardiac arrest in PACU on 2/18. Started on Aspirin 81mg  daily and received 48 hours of IV heparin 2/19-2/21, then transitioned to Enoxaparin 40mg  SQ q24h for VTE prophylaxis (last dose 2/24 at 1132). Bilateral lower extremity venous duplex today + for LLE DVT. Pharmacy consulted for Apixaban dosing on 07/04/21. ? ?On 07/10/21 pharmacy was consulted to dose heparin for bridge therapy while apixaban is on hold for  planned IR procedure  ?Last dose apixaban 10 mg 2/28 @ 2208 ? ?07/17/21 ?Heparin level 0.23 units/mL, subtherapeutic on heparin infusion at 1250 units/hr.  No pauses or interruptions to infusion per RN noted ?CBC: Hgb 9.3, improving. Pltc WNL.  ?No bleeding issues noted by RN  ? ?Goal of Therapy: ?Heparin level 0.3-0.7 units/mL ?Monitor platelets by anticoagulation protocol: Yes ? ?Plan:  ?Increase in heparin drip to 1350 units/hr ?Recheck heparin level in 8hr ?Daily CBC, heparin level ?Monitor closely for s/sx of bleeding ?F/u timing of kyphoplasty & when IR wants to hold heparin infusion ? ?Peggyann Juba, PharmD, BCPS ?Pharmacy: [phone] ?07/17/2021 7:25 AM ? ?

## 2021-07-18 ENCOUNTER — Ambulatory Visit: Payer: Medicare HMO | Admitting: Physician Assistant

## 2021-07-18 DIAGNOSIS — K561 Intussusception: Secondary | ICD-10-CM | POA: Diagnosis not present

## 2021-07-18 DIAGNOSIS — I213 ST elevation (STEMI) myocardial infarction of unspecified site: Secondary | ICD-10-CM

## 2021-07-18 DIAGNOSIS — C50919 Malignant neoplasm of unspecified site of unspecified female breast: Secondary | ICD-10-CM

## 2021-07-18 DIAGNOSIS — Z8719 Personal history of other diseases of the digestive system: Secondary | ICD-10-CM

## 2021-07-18 LAB — CBC WITH DIFFERENTIAL/PLATELET
Abs Immature Granulocytes: 1.48 10*3/uL — ABNORMAL HIGH (ref 0.00–0.07)
Basophils Absolute: 0.1 10*3/uL (ref 0.0–0.1)
Basophils Relative: 1 %
Eosinophils Absolute: 0.2 10*3/uL (ref 0.0–0.5)
Eosinophils Relative: 1 %
HCT: 29.4 % — ABNORMAL LOW (ref 36.0–46.0)
Hemoglobin: 8.7 g/dL — ABNORMAL LOW (ref 12.0–15.0)
Immature Granulocytes: 6 %
Lymphocytes Relative: 3 %
Lymphs Abs: 0.8 10*3/uL (ref 0.7–4.0)
MCH: 28.5 pg (ref 26.0–34.0)
MCHC: 29.6 g/dL — ABNORMAL LOW (ref 30.0–36.0)
MCV: 96.4 fL (ref 80.0–100.0)
Monocytes Absolute: 0.9 10*3/uL (ref 0.1–1.0)
Monocytes Relative: 4 %
Neutro Abs: 20.7 10*3/uL — ABNORMAL HIGH (ref 1.7–7.7)
Neutrophils Relative %: 85 %
Platelets: 198 10*3/uL (ref 150–400)
RBC: 3.05 MIL/uL — ABNORMAL LOW (ref 3.87–5.11)
RDW: 23.5 % — ABNORMAL HIGH (ref 11.5–15.5)
WBC: 24.2 10*3/uL — ABNORMAL HIGH (ref 4.0–10.5)
nRBC: 0 % (ref 0.0–0.2)

## 2021-07-18 LAB — BASIC METABOLIC PANEL
Anion gap: 9 (ref 5–15)
BUN: 9 mg/dL (ref 8–23)
CO2: 22 mmol/L (ref 22–32)
Calcium: 9.3 mg/dL (ref 8.9–10.3)
Chloride: 102 mmol/L (ref 98–111)
Creatinine, Ser: 0.48 mg/dL (ref 0.44–1.00)
GFR, Estimated: >60 mL/min (ref >60)
Glucose, Bld: 92 mg/dL (ref 70–99)
Potassium: 4.1 mmol/L (ref 3.5–5.1)
Sodium: 133 mmol/L — ABNORMAL LOW (ref 135–145)

## 2021-07-18 LAB — PROCALCITONIN: Procalcitonin: 0.8 ng/mL

## 2021-07-18 LAB — HEPARIN LEVEL (UNFRACTIONATED): Heparin Unfractionated: 0.23 IU/mL — ABNORMAL LOW (ref 0.30–0.70)

## 2021-07-18 MED ORDER — APIXABAN 5 MG PO TABS
5.0000 mg | ORAL_TABLET | Freq: Two times a day (BID) | ORAL | Status: DC
  Administered 2021-07-18 – 2021-07-22 (×9): 5 mg via ORAL
  Filled 2021-07-18 (×9): qty 1

## 2021-07-18 NOTE — Progress Notes (Signed)
OT Cancellation Note ? ?Patient Details ?Name: Robin Estrada ?MRN: [id number] ?DOB: 16-Apr-1946 ? ? ?Cancelled Treatment:    Reason Eval/Treat Not Completed: Patient at procedure or test/ unavailable. Spoke with RN this AM states patient has been NPO since midnight and scheduled for kyphoplasty. Will hold OT and check back 3/10 as schedule permits. ? ?Delbert Phenix OT ?OT pager: [phone] ? ? ?Rosemary Holms ?07/18/2021, 9:42 AM ?

## 2021-07-18 NOTE — Progress Notes (Signed)
ANTICOAGULATION CONSULT NOTE   ? ?Pharmacy Consult for heparin  bridge therapy while apixaban on hold ?Indication: DVT ? ?Allergies  ?Allergen Reactions  ? Statins Nausea And Vomiting and Other (See Comments)  ?  MUSCLE PAIN  ? Amlodipine   ?  fatigue  ? Clonidine Derivatives   ?  headache  ? Losartan   ?  abd pain  ? Sulfa Antibiotics Nausea Only  ? Sulfamethoxazole-Trimethoprim Nausea Only  ?  " Severe Nausea "  ? ? ?Patient Measurements: ?Height: 5' (152.4 cm) ?Weight: 61.1 kg (134 lb 11.2 oz) ?IBW/kg (Calculated) : 45.5 ?Heparin dosing weight: 58 kg ? ? ?Vital Signs: ?Temp: 97.9 ?F (36.6 ?C) (03/09 0424) ?BP: 133/62 (03/09 0424) ?Pulse Rate: 107 (03/09 0424) ? ?Labs: ?Recent Labs  ?  07/16/21 ?0518 07/17/21 ?0518 07/17/21 ?0530 07/17/21 ?1542 07/17/21 ?2133 07/18/21 ?0449  ?HGB 8.1* 9.3*  --   --   --  8.7*  ?HCT 27.0* 32.7*  --   --   --  29.4*  ?PLT 308 326  --   --   --  198  ?HEPARINUNFRC 0.30  --    < > 0.35 0.32 0.23*  ?CREATININE 0.43* 0.57  --   --   --  0.48  ? < > = values in this interval not displayed.  ? ? ? ?Estimated Creatinine Clearance: 48.8 mL/min (by C-G formula based on SCr of 0.48 mg/dL). ? ? ?Medical History: ?Past Medical History:  ?Diagnosis Date  ? Breast mass, left   ? Large  ? Cancer [facility])   ? FIBROMYOMA  AGE 89-   BENIGN  RESOLVED  ? GI bleed 05/03/2021  ? 1/23 continue with pantoprazole  ? Headache   ? Heart murmur   ? Hypertension   ? Mitral valve prolapse   ? Tuberculosis   ? AS CHILD  W/ TX  ? ? ?Medications:  ?No anticoagulants PTA  ? ?Assessment: ?76 y/o female with PMH of metastatic breast cancer who is post-op lap R colectomy, end ileostomy.  Experienced STEMI and cardiac arrest in PACU on 2/18. Started on Aspirin 81mg  daily and received 48 hours of IV heparin 2/19-2/21, then transitioned to Enoxaparin 40mg  SQ q24h for VTE prophylaxis (last dose 2/24 at 1132). Bilateral lower extremity venous duplex today + for LLE DVT. Pharmacy consulted for Apixaban dosing on 07/04/21. ? ?On  07/10/21 pharmacy was consulted to dose heparin for bridge therapy while apixaban is on hold for planned IR procedure  ?Last dose apixaban 10 mg 2/28 @ 2208 ? ?07/18/21 ?Heparin level 0.23 units/mL at ~5AM, subtherapeutic on heparin infusion at 1350 units/hr.  No pauses or interruptions to infusion per RN noted ?CBC: Hgb 8.7, decreased. Pltc WNL.  ?No bleeding issues noted by RN  ? ?Goal of Therapy: ?Heparin level 0.3-0.7 units/mL ?Monitor platelets by anticoagulation protocol: Yes ? ?Plan:  ?Heparin turned of at 8AM today for possibly kyphoplasty pending insurance authorization - f/u if this happens today; if not, then heparin will need to resume ? ? ?Peggyann Juba, PharmD, BCPS ?Pharmacy: [phone] ?07/18/2021 8:52 AM ? ?

## 2021-07-18 NOTE — Progress Notes (Signed)
Vernadette Stutsman   DOB:[birth year]   IW#:[id number]   ERD#:[id number] ? ?Oncology follow up  ? ?Subjective: Haila is still very weak, rates her pain at "7", takes oxycodone which helps.  She has not been able to walk  much. She eats small meals. she is very disappointed that kyphoplasty was denied by her insurance. ? ?Objective:  ?Vitals:  ? 07/18/21 1407 07/18/21 1958  ?BP: (!) 134/59 116/63  ?Pulse: (!) 110 (!) 106  ?Resp: 19 20  ?Temp: 98.1 ?F (36.7 ?C) 98.3 ?F (36.8 ?C)  ?SpO2: 98% 98%  ?  Body mass index is 26.31 kg/m?. ? ?Intake/Output Summary (Last 24 hours) at 07/18/2021 2238 ?Last data filed at 07/18/2021 1218 ?Gross per 24 hour  ?Intake 120 ml  ?Output 575 ml  ?Net -455 ml  ? ? ? Sclerae unicteric ?Abdomen soft, with some tenderness to palpation, ileostomy in place ? (+) left arm edema  ? Awake and alert ? ?CBG (last 3)  ?No results for input(s): GLUCAP in the last 72 hours. ? ? ?Labs:  ?Urine Studies ?No results for input(s): UHGB, CRYS in the last 72 hours. ? ?Invalid input(s): UACOL, UAPR, USPG, UPH, UTP, UGL, UKET, UBIL, UNIT, UROB, ULEU, UEPI, UWBC, URBC, UBAC, CAST, UCOM, BILUA ? ?Basic Metabolic Panel: ?Recent Labs  ?Lab 07/14/21 ?0911 07/15/21 ?0500 07/16/21 ?0518 07/17/21 ?5631 07/18/21 ?0449  ?NA 131* 133* 131* 134* 133*  ?K 4.0 3.7 3.5 4.0 4.1  ?CL 99 100 99 101 102  ?CO2 24 25 24 22 22   ?GLUCOSE 139* 129* 120* 117* 92  ?BUN 12 15 10 10 9   ?CREATININE 0.42* 0.45 0.43* 0.57 0.48  ?CALCIUM 8.7* 8.9 8.9 9.6 9.3  ?MG  --   --   --  1.8  --   ? ?GFR ?Estimated Creatinine Clearance: 48.8 mL/min (by C-G formula based on SCr of 0.48 mg/dL). ?Liver Function Tests: ?Recent Labs  ?Lab 07/13/21 ?0530 07/14/21 ?0911 07/15/21 ?0500 07/16/21 ?0518 07/17/21 ?0518  ?AST 23 26 25 29  36  ?ALT 13 12 13 15 20   ?ALKPHOS 208* 229* 241* 308* 396*  ?BILITOT 0.5 0.5 0.3 0.2* 0.3  ?PROT 5.3* 5.2* 5.2* 4.9* 6.0*  ?ALBUMIN 1.7* 1.6* 1.7* 1.6* 1.9*  ? ?No results for input(s): LIPASE, AMYLASE in the last 168 hours. ? ?No results for  input(s): AMMONIA in the last 168 hours. ?Coagulation profile ?Recent Labs  ?Lab 07/12/21 ?0543  ?INR 1.2  ? ? ? ?CBC: ?Recent Labs  ?Lab 07/14/21 ?0911 07/15/21 ?0500 07/16/21 ?0518 07/17/21 ?4970 07/18/21 ?0449  ?WBC 18.9* 20.2* 21.5* 25.6* 24.2*  ?NEUTROABS 16.5* 17.7* 18.3* 22.1* 20.7*  ?HGB 9.1* 8.3* 8.1* 9.3* 8.7*  ?HCT 29.5* 27.1* 27.0* 32.7* 29.4*  ?MCV 89.4 91.6 93.4 98.2 96.4  ?PLT 299 304 308 326 198  ? ?Cardiac Enzymes: ?No results for input(s): CKTOTAL, CKMB, CKMBINDEX, TROPONINI in the last 168 hours. ?BNP: ?Invalid input(s): POCBNP ?CBG: ?No results for input(s): GLUCAP in the last 168 hours. ?D-Dimer ?No results for input(s): DDIMER in the last 72 hours. ?Hgb A1c ?No results for input(s): HGBA1C in the last 72 hours. ? ?Lipid Profile ?No results for input(s): CHOL, HDL, LDLCALC, TRIG, CHOLHDL, LDLDIRECT in the last 72 hours. ? ?Thyroid function studies ?No results for input(s): TSH, T4TOTAL, T3FREE, THYROIDAB in the last 72 hours. ? ?Invalid input(s): FREET3 ?Anemia work up ?No results for input(s): VITAMINB12, FOLATE, FERRITIN, TIBC, IRON, RETICCTPCT in the last 72 hours. ? ?Microbiology ?No results found for this or any previous visit (  from the past 240 hour(s)). ? ? ? ? ?Studies:  ?DG Chest 2 View ? ?Result Date: 07/17/2021 ?CLINICAL DATA:  Follow-up pneumothorax. History of breast cancer and lung cancer EXAM: CHEST - 2 VIEW COMPARISON:  07/14/2021 FINDINGS: Interval progression of right pleural effusion and right lower lobe consolidation. Negative for pneumothorax.  Left lung remains clear. IMPRESSION: Progression of opacity in the right lung base compatible with right pleural effusion and right lower lobe consolidation. Electronically Signed   By: Franchot Gallo M.D.   On: 07/17/2021 09:52   ? ?Assessment: 76 y.o. female  ? 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  Left lower extremity DVT, on eliquis/heparin  ?11. Leukocytosis, likely reactive, possible related to her metastatic cancer ? ?Plan:  ?-pt overall not doing well, very deconditioned, bed-bound  ?-Kyphoplasty has been denied by her insurance due to her recent surgery and infection ?-Has been treated with IV antibiotics for presumed pneumonia, still has a persistent leukocytosis, which I think is related to her underlying malignancy.  I think it is reasonable to stop her antibiotics, and watch her. ?-we discussed discharge planning, home versus SNF.  Patient defers to her son. I will call her son tomorrow  ?-She is not a candidate for systemic treatment for her diffusely metastatic breast cancer, due to her overall poor PS and deconditioning  ?-I again reviewed hospice with her, she was not very interested, and again deferred to her son  ?-will reach out to palliative medicine. Maybe we should schedule a family meeting with her son.  ? ?Truitt Merle, MD ?07/18/2021   ? ? ? ? ?

## 2021-07-18 NOTE — Progress Notes (Addendum)
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  CT abdomen with IV contrast showed large intussusception of the cecum and distal ileum within the length of the ascending colon and subsequent dilatation of the ascending colon. General surgery was consulted.  She underwent laparoscopic right colectomy with end ileostomy on 06/29/2021.She had a post-op cardiac arrest and inferior STEMI-- Cardiology consulted, Not a candidate for cardiac intervention.  Currently on medical management with Coreg.  Also placed on Unasyn.  Overall tolerating diet having colostomy output, alert awake slowly progressing. Currently we await insurance authorization for intercoastal procedure/kyphoplasty for nonpharmacological relief of her severe metastatic bone pain    Subjective: Seen and examined this morning.  Pain not that bad, Was short of breath this morning was off oxygen. Overnight no fever heart rate in the low 100, blood pressure stable WBC count slightly trending down. Patient is n.p.o. for procedure and heparin was held however IR PA informed that kyphoplasty has not been approved, so okay to start Eliquis and feed.  Assessment and Plan:  Large cecal and distal ileum intussusception with bowel obstruction: Presented with severe abdominal pain with nausea and found to have intussusception.Status post laparoscopic right colectomy with end ileostomy 2/18, general surgery following.  Was on Zosyn postoperatively completed, colostomy having output, continue on regular diet.  Repeat CT 3/1 with small areas of intussusception, and does have metastatic disease.  Patient has persistent leukocytosis-source  intra-abdominal versus pulmonary-she is poor surgical surgical candidate  Transient acute hypoxic aspiratory failure Pneumonia versus atelectasis: Cxr With progression of opacity in the right lung base compatible with right pleural effusion and right lower lobe consolidation.  Has been on Unasyn  since 3/6 and given persistent leukocytosis- checked procalcitonin andi s up, we witched antibiotics to Meropenem 3/8. ContOOB, I-S PT OT.  Acute blood loss anemia Anemia of chronic disease: Needed transfusion 1 u PRBC on 3/3 and IV iron on 3/4.  hb stable.Monitor Recent Labs  Lab 07/14/21 0911 07/15/21 0500 07/16/21 0518 07/17/21 0518 07/18/21 0449  HGB 9.1* 8.3* 8.1* 9.3* 8.7*  HCT 29.5* 27.1* 27.0* 32.7* 29.4*      Inferior STEMI / Postop cardiac arrest  Ischemic cardiomyopathy; -Troponin peaked to 24,000, s/p TTE  w/LV ejection fraction of 50%, the left ventricle demonstrated RWMA.  Being treated with heparin gtt-IR now not planning for kyphoplasty given insurance denied despite.  TPL, okay to resume Eliquis. Pt refusing statin,zetia, imdur.Cardiology feels that she is a poor cath candidate and have signed off.   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  Pathologic fracture deformity at the level of T11: She is s/p total mastectomy in 2019,  s/p CT-guided lung biopsy on 05/14/2021- results consistent of poorly differentiated malignancy with sarcomatoid and epithelioid features.  Continue with oral oxy and IV Dilaudid as needed. Awaiting insurances to proceed with osteocool and kyphoplasty at least at T11, but also potentially at L3, L4 and L5.Patient is not accepting of hospice philosophy despite detailed discussions by her  oncologist  History of GI bleed hemoglobin  is stable on PPI Essential hypertension:  controlled cont on low-dose Coreg. Cystic mass of pancreas previous CA 19-9 level normal follow-up outpatient.  PTA sacral decubitus:turn frequently  Poor overall prognosis, palliative care  IR and oncology involved Pain meds per palliative care's input- Good discussion with the patient's son on 3/6 with regards to possibility of pneumonia and discussion about goals of care-he is hopeful for recovery while understanding that if she has pneumonia this complicates the picture and understands that we may need to have different discussions if she fails to thrive-I went over the concept of adult failure to thrive with him in her case as well and he understands At this time no plan to pursue inpatient procedure/kyphoplasty.  Disposition  HH PT OT versus SNF.   ADDENDUM: Discussed with Dr. Burr Medico. Patient has persistent leukocytosis but no fever-Pro-Cal is marginally elevated I suspect this is most likely malignancy related.  Also discussed with the son we all agree- to hold off antibiotics from a.m. and monitor without antibiotics.  Patient has no respiratory symptoms, has pain with movement and would likely benefit of kyphoplasty-insurance has denied, discussed with IR PA.  Also with son and he will look into appealing the decision- social worker notified. Son wants to make the patient has come as comfortable as we can as she may not have much time overall prognosis guarded.  He wants to take her home with home health, due to low rating of the approved skilled nursing facility he does not want to consider it.  PTOT-recommending home health PT rolling walker with 2 wheels and bedside commode DVT prophylaxis: Place and maintain sequential compression device Start: 07/03/21 0815 Code Status:   Code Status: Full Code Family Communication: plan of care discussed with patient/Son at bedside 3/8. Son not at bedside  today.  Disposition: Currently not medically stable for discharge. Status is: Inpatient Remains inpatient appropriate because: Ongoing management of pain respiratory failure leukocytosis, continues need IV antibiotics  Objective: Vitals last 24 hrs: Vitals:   07/17/21 1516 07/17/21 1722 07/17/21 2107 07/18/21 0424  BP: 118/66 125/62 (!) 118/58 133/62  Pulse: (!) 110 (!) 103 (!) 108 (!) 107  Resp: 16  16 18   Temp: 97.7 F (36.5 C)  98.7 F (37.1 C) 97.9 F (36.6 C)  TempSrc: Oral     SpO2: 96%  90% 91%  Weight:    61.1 kg  Height:       Weight change:   Physical Examination:  General exam:AA, weak and frail looking older than stated age, weak appearing. HEENT:Oral mucosa moist, Ear/Nose WNL grossly, dentition normal. Respiratory system: bilaterally diminished, no use of accessory muscle Cardiovascular system: S1 & S2 +, No JVD,. Gastrointestinal system: Abdomen soft, colostomy in place with stool,ND,BS+ Nervous System:Alert, awake, moving extremities and grossly nonfocal Extremities: LE ankle edema mild,distal peripheral pulses palpable.  Skin: No rashes,no icterus. MSK: Normal muscle bulk,tone, power   Medications reviewed:  Scheduled Meds:  (feeding supplement) PROSource Plus  30 mL Oral TID BM   apixaban  5 mg Oral BID   carvedilol  3.125 mg Oral BID WC   DULoxetine  20 mg Oral Daily   feeding supplement  1 Container Oral BID BM   lidocaine  1 patch Transdermal Q24H   methocarbamol  500 mg Oral TID   mirtazapine  15 mg Oral QHS   multivitamin with minerals  1 tablet Oral Daily   nutrition supplement (JUVEN)  1 packet Oral BID BM   pantoprazole  40 mg Oral QHS   Continuous Infusions:  meropenem (MERREM) IV 1 g (07/18/21 0503)      Diet Order     None        Nutrition Problem: Increased nutrient needs Etiology: cancer and cancer related treatments, post-op healing Signs/Symptoms: estimated needs Interventions: [employer], [employer],  MVI, Refer to RD note for recommendations   Intake/Output Summary (Last 24 hours) at 07/18/2021 1118 Last data filed at 07/18/2021 0425 Gross per 24 hour  Intake 783.99 ml  Output 475 ml  Net 308.99 ml    Net IO Since Admission: 7,143.07 mL [07/18/21 1118]  Wt Readings from Last 3 Encounters:  07/18/21 61.1 kg  06/28/21 50.2 kg  06/10/21 54.8 kg     Unresulted Labs (From admission, onward)     Start     Ordered   07/18/21 0500  Procalcitonin  Daily,   R     Question:  Specimen collection method  Answer:  Lab=Lab collect   07/17/21 1309   07/18/21 8502  Basic metabolic panel  Daily,   R     Question:  Specimen collection method  Answer:  Lab=Lab collect   07/17/21 1310   07/16/21 0500  CBC with Differential/Platelet  Daily,   R     Question:  Specimen collection method  Answer:  Lab=Lab collect   07/15/21 0614   Unscheduled  Occult blood card to lab, stool  As needed,   R      07/16/21 0803          Data Reviewed: I have personally reviewed following labs and imaging studies CBC: Recent Labs  Lab 07/14/21 0911 07/15/21 0500 07/16/21 0518 07/17/21 0518 07/18/21 0449  WBC 18.9* 20.2* 21.5* 25.6* 24.2*  NEUTROABS 16.5* 17.7* 18.3* 22.1* 20.7*  HGB 9.1* 8.3* 8.1* 9.3* 8.7*  HCT 29.5* 27.1* 27.0* 32.7* 29.4*  MCV 89.4 91.6 93.4 98.2 96.4  PLT 299 304 308 326 774    Basic Metabolic Panel: Recent Labs  Lab 07/14/21 0911 07/15/21 0500 07/16/21 0518 07/17/21 0518 07/18/21 0449  NA 131* 133* 131* 134* 133*  K 4.0 3.7 3.5 4.0 4.1  CL 99 100 99 101 102  CO2 24 25 24 22 22   GLUCOSE 139* 129* 120* 117* 92  BUN 12 15 10 10 9   CREATININE 0.42* 0.45 0.43* 0.57 0.48  CALCIUM 8.7* 8.9 8.9 9.6 9.3  MG  --   --   --  1.8  --     GFR: Estimated Creatinine Clearance: 48.8 mL/min (by C-G formula based on SCr of 0.48 mg/dL). Liver Function Tests: Recent Labs  Lab 07/13/21 0530 07/14/21 0911 07/15/21 0500 07/16/21 0518 07/17/21 0518  AST 23 26 25 29  36  ALT 13 12  13 15 20   ALKPHOS 208* 229* 241* 308* 396*  BILITOT 0.5 0.5 0.3 0.2* 0.3  PROT 5.3* 5.2* 5.2* 4.9* 6.0*  ALBUMIN 1.7* 1.6* 1.7* 1.6* 1.9*    No results for input(s): LIPASE, AMYLASE in the last 168 hours. No results for input(s): AMMONIA in the last 168 hours. Coagulation Profile: Recent Labs  Lab 07/12/21 0543  INR 1.2    Cardiac Enzymes: No results for input(s): CKTOTAL, CKMB, CKMBINDEX, TROPONINI in the last 168 hours. BNP (last 3 results) No results for input(s): PROBNP in the last 8760 hours. HbA1C: No results for input(s): HGBA1C in the last 72 hours. CBG: No results for input(s): GLUCAP in the last 168 hours. Lipid Profile: No results for  input(s): CHOL, HDL, LDLCALC, TRIG, CHOLHDL, LDLDIRECT in the last 72 hours. Thyroid Function Tests: No results for input(s): TSH, T4TOTAL, FREET4, T3FREE, THYROIDAB in the last 72 hours. Anemia Panel: No results for input(s): VITAMINB12, FOLATE, FERRITIN, TIBC, IRON, RETICCTPCT in the last 72 hours. Sepsis Labs: Recent Labs  Lab 07/17/21 0546 07/18/21 0449  PROCALCITON 0.73 0.80    No results found for this or any previous visit (from the past 240 hour(s)).  Antimicrobials: Anti-infectives (From admission, onward)    Start     Dose/Rate Route Frequency Ordered Stop   07/17/21 2000  meropenem (MERREM) 1 g in sodium chloride 0.9 % 100 mL IVPB        1 g 200 mL/hr over 30 Minutes Intravenous Every 8 hours 07/17/21 1412     07/15/21 0615  Ampicillin-Sulbactam (UNASYN) 3 g in sodium chloride 0.9 % 100 mL IVPB  Status:  Discontinued        3 g 200 mL/hr over 30 Minutes Intravenous Every 6 hours 07/15/21 0608 07/17/21 1411   06/28/21 2200  piperacillin-tazobactam (ZOSYN) IVPB 3.375 g  Status:  Discontinued        3.375 g 12.5 mL/hr over 240 Minutes Intravenous Every 8 hours 06/28/21 2120 07/04/21 0841      Culture/Microbiology    Component Value Date/Time   SDES URINE, CLEAN CATCH 05/03/2021 0539   SPECREQUEST NONE  05/03/2021 0539   CULT (A) 05/03/2021 0539    <10,000 COLONIES/mL INSIGNIFICANT GROWTH Performed at [facility] [address], [place], [place] [address]    REPTSTATUS 05/04/2021 FINAL 05/03/2021 0539  Other culture-see note 9 Radiology Studies:DG Chest 2 View  Result Date: 07/17/2021 CLINICAL DATA:  Follow-up pneumothorax. History of breast cancer and lung cancer EXAM: CHEST - 2 VIEW COMPARISON:  07/14/2021 FINDINGS: Interval progression of right pleural effusion and right lower lobe consolidation. Negative for pneumothorax.  Left lung remains clear. IMPRESSION: Progression of opacity in the right lung base compatible with right pleural effusion and right lower lobe consolidation. Electronically Signed   By: Franchot Gallo M.D.   On: 07/17/2021 09:52     LOS: 20 days   Antonieta Pert, MD [facility]  07/18/2021, 11:18 AM

## 2021-07-18 NOTE — Progress Notes (Signed)
Patient ID: Robin Estrada, female   DOB: 1945/06/15, 76 y.o.   MRN: [id number] ? ?Brief Progress Note: ? ?Insurance authorization was denied for osteocool kyphoplasty procedure as an inpatient despite peer to peer, secondary to recent surgery, DVT on heparin, current infection on antibiotics, and concern for epidural extension on MR.  No spinal intervention during this admission. ? ?This can be pursued once more as an outpatient if desired, but unknown if insurance will approve in OP setting either. ? ? ?Electronically Signed: ?Pasty Spillers, PA ?07/18/2021, 10:27 AM ? ? ? ? ? ?

## 2021-07-19 DIAGNOSIS — K561 Intussusception: Secondary | ICD-10-CM | POA: Diagnosis not present

## 2021-07-19 LAB — CBC WITH DIFFERENTIAL/PLATELET
Abs Immature Granulocytes: 1.23 10*3/uL — ABNORMAL HIGH (ref 0.00–0.07)
Basophils Absolute: 0.1 10*3/uL (ref 0.0–0.1)
Basophils Relative: 1 %
Eosinophils Absolute: 0.2 10*3/uL (ref 0.0–0.5)
Eosinophils Relative: 1 %
HCT: 29.3 % — ABNORMAL LOW (ref 36.0–46.0)
Hemoglobin: 8.8 g/dL — ABNORMAL LOW (ref 12.0–15.0)
Immature Granulocytes: 5 %
Lymphocytes Relative: 3 %
Lymphs Abs: 0.7 10*3/uL (ref 0.7–4.0)
MCH: 28.7 pg (ref 26.0–34.0)
MCHC: 30 g/dL (ref 30.0–36.0)
MCV: 95.4 fL (ref 80.0–100.0)
Monocytes Absolute: 1.1 10*3/uL — ABNORMAL HIGH (ref 0.1–1.0)
Monocytes Relative: 4 %
Neutro Abs: 23.8 10*3/uL — ABNORMAL HIGH (ref 1.7–7.7)
Neutrophils Relative %: 86 %
Platelets: 320 10*3/uL (ref 150–400)
RBC: 3.07 MIL/uL — ABNORMAL LOW (ref 3.87–5.11)
RDW: 22.9 % — ABNORMAL HIGH (ref 11.5–15.5)
WBC: 27.1 10*3/uL — ABNORMAL HIGH (ref 4.0–10.5)
nRBC: 0 % (ref 0.0–0.2)

## 2021-07-19 LAB — BASIC METABOLIC PANEL
Anion gap: 11 (ref 5–15)
BUN: 10 mg/dL (ref 8–23)
CO2: 22 mmol/L (ref 22–32)
Calcium: 9 mg/dL (ref 8.9–10.3)
Chloride: 102 mmol/L (ref 98–111)
Creatinine, Ser: 0.46 mg/dL (ref 0.44–1.00)
GFR, Estimated: >60 mL/min (ref >60)
Glucose, Bld: 107 mg/dL — ABNORMAL HIGH (ref 70–99)
Potassium: 3.8 mmol/L (ref 3.5–5.1)
Sodium: 135 mmol/L (ref 135–145)

## 2021-07-19 LAB — PROCALCITONIN: Procalcitonin: 0.76 ng/mL

## 2021-07-19 MED ORDER — OXYCODONE HCL ER 10 MG PO T12A
10.0000 mg | EXTENDED_RELEASE_TABLET | Freq: Two times a day (BID) | ORAL | Status: DC
  Administered 2021-07-20 – 2021-07-21 (×3): 10 mg via ORAL
  Filled 2021-07-19 (×4): qty 1

## 2021-07-19 NOTE — Progress Notes (Signed)
Robin Estrada   DOB:01/14/1946   XQ#:[id number]   XKG#:[id number] ? ?Oncology follow up  ? ?Subjective: No fever or other new event today. Per her son, iv dilaudid cause sone confusion and brain foggy, she tolerates oxycodone well. I met her son today around 6pm.  ? ? ?Objective:  ?Vitals:  ? 07/19/21 1300 07/19/21 2046  ?BP:  116/68  ?Pulse: (!) 106 (!) 106  ?Resp: 20 18  ?Temp:  98 ?F (36.7 ?C)  ?SpO2: 96% 97%  ?  Body mass index is 26.78 kg/m?. ? ?Intake/Output Summary (Last 24 hours) at 07/19/2021 2236 ?Last data filed at 07/19/2021 1700 ?Gross per 24 hour  ?Intake 420 ml  ?Output 650 ml  ?Net -230 ml  ? ? ? Sclerae unicteric ?Abdomen soft, with some tenderness to palpation, ileostomy in place ? (+) left arm edema  ? Awake and alert ? ?CBG (last 3)  ?No results for input(s): GLUCAP in the last 72 hours. ? ? ?Labs:  ?Urine Studies ?No results for input(s): UHGB, CRYS in the last 72 hours. ? ?Invalid input(s): UACOL, UAPR, USPG, UPH, UTP, UGL, UKET, UBIL, UNIT, UROB, ULEU, UEPI, UWBC, URBC, UBAC, CAST, UCOM, BILUA ? ?Basic Metabolic Panel: ?Recent Labs  ?Lab 07/15/21 ?0500 07/16/21 ?0518 07/17/21 ?0518 07/18/21 ?7026 07/19/21 ?0451  ?NA 133* 131* 134* 133* 135  ?K 3.7 3.5 4.0 4.1 3.8  ?CL 100 99 101 102 102  ?CO2 25 24 22 22 22   ?GLUCOSE 129* 120* 117* 92 107*  ?BUN 15 10 10 9 10   ?CREATININE 0.45 0.43* 0.57 0.48 0.46  ?CALCIUM 8.9 8.9 9.6 9.3 9.0  ?MG  --   --  1.8  --   --   ? ?GFR ?Estimated Creatinine Clearance: 49.3 mL/min (by C-G formula based on SCr of 0.46 mg/dL). ?Liver Function Tests: ?Recent Labs  ?Lab 07/13/21 ?0530 07/14/21 ?0911 07/15/21 ?0500 07/16/21 ?0518 07/17/21 ?0518  ?AST 23 26 25 29  36  ?ALT 13 12 13 15 20   ?ALKPHOS 208* 229* 241* 308* 396*  ?BILITOT 0.5 0.5 0.3 0.2* 0.3  ?PROT 5.3* 5.2* 5.2* 4.9* 6.0*  ?ALBUMIN 1.7* 1.6* 1.7* 1.6* 1.9*  ? ?No results for input(s): LIPASE, AMYLASE in the last 168 hours. ? ?No results for input(s): AMMONIA in the last 168 hours. ?Coagulation profile ?No results  for input(s): INR, PROTIME in the last 168 hours. ? ? ? ?CBC: ?Recent Labs  ?Lab 07/15/21 ?0500 07/16/21 ?0518 07/17/21 ?0518 07/18/21 ?3785 07/19/21 ?0451  ?WBC 20.2* 21.5* 25.6* 24.2* 27.1*  ?NEUTROABS 17.7* 18.3* 22.1* 20.7* 23.8*  ?HGB 8.3* 8.1* 9.3* 8.7* 8.8*  ?HCT 27.1* 27.0* 32.7* 29.4* 29.3*  ?MCV 91.6 93.4 98.2 96.4 95.4  ?PLT 304 308 326 198 320  ? ?Cardiac Enzymes: ?No results for input(s): CKTOTAL, CKMB, CKMBINDEX, TROPONINI in the last 168 hours. ?BNP: ?Invalid input(s): POCBNP ?CBG: ?No results for input(s): GLUCAP in the last 168 hours. ?D-Dimer ?No results for input(s): DDIMER in the last 72 hours. ?Hgb A1c ?No results for input(s): HGBA1C in the last 72 hours. ? ?Lipid Profile ?No results for input(s): CHOL, HDL, LDLCALC, TRIG, CHOLHDL, LDLDIRECT in the last 72 hours. ? ?Thyroid function studies ?No results for input(s): TSH, T4TOTAL, T3FREE, THYROIDAB in the last 72 hours. ? ?Invalid input(s): FREET3 ?Anemia work up ?No results for input(s): VITAMINB12, FOLATE, FERRITIN, TIBC, IRON, RETICCTPCT in the last 72 hours. ? ?Microbiology ?No results found for this or any previous visit (from the past 240 hour(s)). ? ? ? ? ?Studies:  ?  No results found. ? ?Assessment: 76 y.o. female  ? 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  Left lower extremity DVT, on eliquis/heparin  ?11. Leukocytosis, likely reactive, possible related to her metastatic cancer ? ?Plan:  ?Pt still has severe pain when she gets in and out of bed and exercise. She tolerates oxycodone very well, I recommend adding oxycontin 80m q12h starting tomorrow morning. Pt needs oxycodone scheduled 30-612ms before PT/OT  sessions ?-I had long conversation with her son Robin Dickeroday.  I reviewed his mother's overall guarded prognosis, expected life expectancy in a few months, and my recommendation of hospice.  I explained the philosophy and logistics of home hospice service, and residential hospice.  He is open to hospice.  [place] at some point. He is very frustrated about the denial of Kyphoplasty, and poor quality of care the rehab programs our case manager offered.  ?-Robin Estrada wants [facility] offer a few rehab with the best quality of patient care, he is willing to do self pay if her insurance does not cover, he is also entertaining hiring personal care for her mother at home, needs some information about personal care agency, if our case manager is able to offer  ?-Salva will be traveling this weekend and early next week, will not be ready to take her home soon if the final decision is going home with hospice  ?-Salva still want IR to appeal the insurance denial of kyphoplasty.  He also wants to know the out-of-pocket cost if insurance does not cover.  He may be willing to pay.  ? ?I spent a total of 50 mins with her care today.  ? ? ?YaTruitt MerleMD ?07/19/2021   ? ? ? ? ?

## 2021-07-19 NOTE — Progress Notes (Signed)
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  CT abdomen with IV contrast showed large intussusception of the cecum and distal ileum within the length of the ascending colon and subsequent dilatation of the ascending colon. General surgery was consulted.  She underwent laparoscopic right colectomy with end ileostomy on 06/29/2021.She had a post-op cardiac arrest and inferior STEMI-- Cardiology consulted, Not a candidate for cardiac intervention.  Currently on medical management with Coreg.  Also placed on Unasyn.  Overall tolerating diet having colostomy output, alert awake slowly progressing. Currently we await insurance authorization for intercoastal procedure/kyphoplasty for nonpharmacological relief of her severe metastatic bone pain    Subjective: Seen and examined this morning.  Resting comfortably, offers no complaints. Colostomy with stool output. Overnight no fever heart rate 1 7110, saturating well on room air Labs shows elevated WBC count 21.1 Pro-Cal more or less same 0.7 stable renal function IV Dilaudid last used 3/8 and on oxycodone f q4hr prn 5 mg and on a scheduled Robaxin Cymbalta  Assessment and Plan:  Large cecal and distal ileum intussusception with bowel obstruction: Presented with severe abdominal pain with nausea and found to have intussusception.Status post laparoscopic right colectomy with end ileostomy 2/18, general surgery following.  Was on Zosyn postoperatively completed, Tolerating regular diet and having output in colostomy.  Last CT abdomen 2/28-showed small areas of intussusception, and does have metastatic disease-she is poor repeat surgical  surgical candidate-continue to monitor.  Does have elevated WBC count-poss ACS less likely from intra-abdominal source.  Transient acute hypoxic aspiratory failure Right pleural effusion with right lower lobe consolidation-likely atelectasis: Cxr With progression of opacity in the right lung base compatible with right pleural effusion and right lower lobe consolidation.Has been on Unasyn  since 3/6 and given persistent leukocytosis- checked procalcitonin and is marginal 0.7- 0.8. Switched antibiotics to Meropenem 3/8> after discussion with patient's son, oncology stopped antibiotics 3/10 we will monitor closely less likely pneumonia.ContOOB, I-S PT OT.  Acute blood loss anemia Anemia of chronic disease: Needed transfusion 1 u PRBC on 3/3 and IV iron on 3/4.  hb stable.monitor CBC. Recent Labs  Lab 07/15/21 0500 07/16/21 0518 07/17/21 0518 07/18/21 0449 07/19/21 0451  HGB 8.3* 8.1* 9.3* 8.7* 8.8*  HCT 27.1* 27.0* 32.7* 29.4* 29.3*     Inferior STEMI / Postop cardiac arrest  Ischemic cardiomyopathy; -Troponin peaked to 24,000, s/p TTE  w/LV ejection fraction of 50%, the left ventricle demonstrated RWMA.  Being treated with heparin gtt. now on Eliquis.refusing statin,zetia, imdur.Cardiology feels that she is a poor cath candidate and have signed off.   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  Pathologic fracture deformity at the level of T11: She is s/p total mastectomy in 2019,  s/p CT-guided lung biopsy on 05/14/2021- results consistent of poorly differentiated malignancy with sarcomatoid and epithelioid features.  Continue with oral oxy and IV Dilaudid as needed. Insurances denied kyphoplasty at least at T11, but also potentially at L3, L4  and L5 Patient is not accepting of hospice philosophy despite  detailed discussions by her oncologist Dr. Burr Estrada is following.  Appreciate input.  History of GI bleed hemoglobin is stable on PPI Essential hypertension: stable on low-dose Coreg. Cystic mass of pancreas previous CA 19-9 level normal follow-up outpatient.  PTA sacral decubitus:turn frequently  Poor overall prognosis, palliative care  IR and oncology involved Pain meds per palliative care's input- Good discussion with the patient's son on 3/6 with regards to possibility of pneumonia and discussion about goals of care-he is hopeful for recovery while understanding that if she has pneumonia this complicates the picture and understands that we may need to have different discussions if she fails to thrive-I went over the concept of adult failure to thrive with him in her case as well and he understands  DVT prophylaxis: Place and maintain sequential compression device Start: 07/03/21 0815 Code Status:   Code Status: Full Code Family Communication: plan of care discussed with patient/Son at bedside 3/8, over the phone on 3/9.  Disposition: Currently not medically stable for discharge. Status is: Inpatient Remains inpatient appropriate because: Ongoing management of pain and multiple issues.   PTOT recommending skilled nursing facility but son wants to take the patient home w/ HH once pain is better controlled, he is hoping for kyphoplasty and this should help her mobilize more and this pain what ever time she has with her grandkids, he is appealing the insurance denial. Discussed with TOC this morning Rdoney has provided case number and details for son to call the insurance to start the appeal process.  Objective: Vitals last 24 hrs: Vitals:   07/18/21 0424 07/18/21 1407 07/18/21 1958 07/19/21 0359  BP: 133/62 (!) 134/59 116/63 126/62  Pulse: (!) 107 (!) 110 (!) 106 (!) 110  Resp: 18 19 20 16   Temp: 97.9 F (36.6 C) 98.1 F (36.7  C) 98.3 F (36.8 C) 98.2 F (36.8 C)  TempSrc:  Oral  Oral  SpO2: 91% 98% 98% 97%  Weight: 61.1 kg   62.2 kg  Height:       Weight change: 1.1 kg  Physical Examination: General exam: AA,older than stated age, weak appearing. HEENT:Oral mucosa moist, Ear/Nose WNL grossly, dentition normal. Respiratory system: bilaterally diminished,no use of accessory muscle Cardiovascular system: S1 & S2 +, No JVD,. Gastrointestinal system: Abdomen soft,NT,ND, BS+ colostomy in place with a stool Nervous System:Alert, awake, moving extremities and grossly nonfocal Extremities: mild edema,distal peripheral pulses palpable.  Skin: No rashes,no icterus. MSK:  thin muscle bulk,tone, power  Medications reviewed:  Scheduled Meds:  (feeding supplement) PROSource Plus  30 mL Oral TID BM   apixaban  5 mg Oral BID   carvedilol  3.125 mg Oral BID WC   DULoxetine  20 mg Oral Daily   feeding supplement  1 Container Oral BID BM   lidocaine  1 patch Transdermal Q24H   methocarbamol  500 mg Oral TID   mirtazapine  15 mg Oral QHS   multivitamin with minerals  1 tablet Oral Daily   nutrition supplement (JUVEN)  1 packet Oral BID BM   pantoprazole  40 mg Oral QHS   Continuous Infusions:      Diet Order             Diet regular Room service appropriate? Yes; Fluid consistency: Thin  Diet effective now                    Nutrition Problem: Increased nutrient needs Etiology: cancer and cancer related treatments, post-op healing Signs/Symptoms: estimated needs  Interventions: [employer], [employer], MVI, Refer to RD note for recommendations   Intake/Output Summary (Last 24 hours) at 07/19/2021 1123 Last data filed at 07/19/2021 0955 Gross per 24 hour  Intake 240 ml  Output 625 ml  Net -385 ml   Net IO Since Admission: 6,758.07 mL [07/19/21 1123]  Wt Readings from Last 3 Encounters:  07/19/21 62.2 kg  06/28/21 50.2 kg  06/10/21 54.8 kg     Unresulted Labs (From  admission, onward)     Start     Ordered   07/18/21 7867  Basic metabolic panel  Daily,   R     Question:  Specimen collection method  Answer:  Lab=Lab collect   07/17/21 1310   07/16/21 0500  CBC with Differential/Platelet  Daily,   R     Question:  Specimen collection method  Answer:  Lab=Lab collect   07/15/21 0614   Unscheduled  Occult blood card to lab, stool  As needed,   R      07/16/21 0803          Data Reviewed: I have personally reviewed following labs and imaging studies CBC: Recent Labs  Lab 07/15/21 0500 07/16/21 0518 07/17/21 0518 07/18/21 0449 07/19/21 0451  WBC 20.2* 21.5* 25.6* 24.2* 27.1*  NEUTROABS 17.7* 18.3* 22.1* 20.7* 23.8*  HGB 8.3* 8.1* 9.3* 8.7* 8.8*  HCT 27.1* 27.0* 32.7* 29.4* 29.3*  MCV 91.6 93.4 98.2 96.4 95.4  PLT 304 308 326 198 672   Basic Metabolic Panel: Recent Labs  Lab 07/15/21 0500 07/16/21 0518 07/17/21 0518 07/18/21 0449 07/19/21 0451  NA 133* 131* 134* 133* 135  K 3.7 3.5 4.0 4.1 3.8  CL 100 99 101 102 102  CO2 25 24 22 22 22   GLUCOSE 129* 120* 117* 92 107*  BUN 15 10 10 9 10   CREATININE 0.45 0.43* 0.57 0.48 0.46  CALCIUM 8.9 8.9 9.6 9.3 9.0  MG  --   --  1.8  --   --    GFR: Estimated Creatinine Clearance: 49.3 mL/min (by C-G formula based on SCr of 0.46 mg/dL). Liver Function Tests: Recent Labs  Lab 07/13/21 0530 07/14/21 0911 07/15/21 0500 07/16/21 0518 07/17/21 0518  AST 23 26 25 29  36  ALT 13 12 13 15 20   ALKPHOS 208* 229* 241* 308* 396*  BILITOT 0.5 0.5 0.3 0.2* 0.3  PROT 5.3* 5.2* 5.2* 4.9* 6.0*  ALBUMIN 1.7* 1.6* 1.7* 1.6* 1.9*   No results for input(s): LIPASE, AMYLASE in the last 168 hours. No results for input(s): AMMONIA in the last 168 hours. Coagulation Profile: No results for input(s): INR, PROTIME in the last 168 hours.  Cardiac Enzymes: No results for input(s): CKTOTAL, CKMB, CKMBINDEX, TROPONINI in the last 168 hours. BNP (last 3 results) No results for input(s): PROBNP in the last  8760 hours. HbA1C: No results for input(s): HGBA1C in the last 72 hours. CBG: No results for input(s): GLUCAP in the last 168 hours. Lipid Profile: No results for input(s): CHOL, HDL, LDLCALC, TRIG, CHOLHDL, LDLDIRECT in the last 72 hours. Thyroid Function Tests: No results for input(s): TSH, T4TOTAL, FREET4, T3FREE, THYROIDAB in the last 72 hours. Anemia Panel: No results for input(s): VITAMINB12, FOLATE, FERRITIN, TIBC, IRON, RETICCTPCT in the last 72 hours. Sepsis Labs: Recent Labs  Lab 07/17/21 0546 07/18/21 0449 07/19/21 0451  PROCALCITON 0.73 0.80 0.76    No results found for this or any previous visit (from the past 240 hour(s)).  Antimicrobials: Anti-infectives (From  admission, onward)    Start     Dose/Rate Route Frequency Ordered Stop   07/17/21 2000  meropenem (MERREM) 1 g in sodium chloride 0.9 % 100 mL IVPB  Status:  Discontinued        1 g 200 mL/hr over 30 Minutes Intravenous Every 8 hours 07/17/21 1412 07/19/21 0856   07/15/21 0615  Ampicillin-Sulbactam (UNASYN) 3 g in sodium chloride 0.9 % 100 mL IVPB  Status:  Discontinued        3 g 200 mL/hr over 30 Minutes Intravenous Every 6 hours 07/15/21 0608 07/17/21 1411   06/28/21 2200  piperacillin-tazobactam (ZOSYN) IVPB 3.375 g  Status:  Discontinued        3.375 g 12.5 mL/hr over 240 Minutes Intravenous Every 8 hours 06/28/21 2120 07/04/21 0841      Culture/Microbiology    Component Value Date/Time   SDES URINE, CLEAN CATCH 05/03/2021 0539   SPECREQUEST NONE 05/03/2021 0539   CULT (A) 05/03/2021 0539    <10,000 COLONIES/mL INSIGNIFICANT GROWTH Performed at [facility] [address] [place], [place] [address]    REPTSTATUS 05/04/2021 FINAL 05/03/2021 0539  Other culture-see note 9 Radiology Studies:No results found.   LOS: 21 days   Antonieta Pert, MD [facility]  07/19/2021, 11:23 AM

## 2021-07-19 NOTE — Progress Notes (Signed)
2130 ?Patient request to use BSC. Patient unable to stand and pivot. Patient was assisted (x2) back to bed. Bladder scan previously completed, urine retention not met for intervention. Patient in bed resting no signs of distress noted. Bed locked armed and in lowest level, remote and call light within reach. No additional needs at this time. Will continue to monitor.  ? ?0430 ?Due to deconditioning patient is unable to ambulate. Patient is unable to complete ambulating oxygen assessment. Patient in bed resting no signs of distress noted. Bed locked armed and in lowest level, remote and call light within reach. No additional needs at this time. Will continue to monitor.  ?

## 2021-07-19 NOTE — TOC Progression Note (Signed)
Transition of Care (TOC) - Progression Note  ? ? ?Patient Details  ?Name: Robin Estrada ?MRN: [id number] ?Date of Birth: May 02, 1946 ? ?Transition of Care (TOC) CM/SW Contact  ?Trish Mage, LCSW ?Phone Number: ?07/19/2021, 8:24 AM ? ?Clinical Narrative:   Patient was denied by insurance for kyphoplasty.  MD asked that I work with son on appeal process as son had expressed interest in pursing this. Was able to get reference  # [id number] from IR PA.  Called son and gave him number.  He confirmed that he is calling to appeal.  When asked about his plan with mother, either rehab or home with care, he stated he will not make that decision until after kyphoplasty, saying he would not know her level of need until then. TOC will continue to follow during the course of hospitalization. ? ? ? ? ?Expected Discharge Plan: [place] ?Barriers to Discharge: Continued Medical Work up ? ?Expected Discharge Plan and Services ?Expected Discharge Plan: [place] ?  ?Discharge Planning Services: CM Consult ?Post Acute Care Choice: NA ?Living arrangements for the past 2 months: [place] ?                ?  ?  ?  ?  ?  ?  ?  ?  ?  ?  ? ? ?Social Determinants of Health (SDOH) Interventions ?  ? ?Readmission Risk Interventions ?Readmission Risk Prevention Plan 07/03/2021  ?Transportation Screening Complete  ?PCP or Specialist Appt within 3-5 Days Complete  ?[place] or Home Care Consult Complete  ?Social Work Consult for [place] Planning/Counseling Complete  ?Palliative Care Screening Complete  ?Medication Review [employer] photographer) Complete  ?Some recent data might be hidden  ? ? ?

## 2021-07-19 NOTE — Progress Notes (Signed)
Physical Therapy Treatment ?Patient Details ?Name: Catalea Labrecque ?MRN: [id number] ?DOB: [birth year] ?Today's Date: 07/19/2021 ? ? ?History of Present Illness 76 year old [name] with PMH significant of lung cancer, history of breast cancer, new vertebral masses presents in the ER with several days history of worsening abdominal pain, and nausea. CT abdomen with IV contrast showed large intussusception of the cecum and distal ileum within the length of the ascending colon and subsequent dilatation of the ascending colon. There are also noted various and numerous lytic lesions throughout the thoracolumbar spine and pelvis consistent with metastatic disease. General surgery was consulted.  She underwent laparoscopic right colectomy with end ileostomy on 06/29/2021.  She had a post-op cardiac arrest and inferior STEMI-- Cardiology consulted, Not a candidate for cardiac intervention ? ?  ?PT Comments  ? ? Pt agreeable to OOB, requiring overall mod assist of 2, participating well with PT however limited by fatigue. HR up to 134 with transfers and light LE exercises.   ?Recommendations for follow up therapy are one component of a multi-disciplinary discharge planning process, led by the attending physician.  Recommendations may be updated based on patient status, additional functional criteria and insurance authorization. ? ?Follow Up Recommendations ? Skilled nursing-short term rehab (<3 hours/day) ?  ?  ?Assistance Recommended at Discharge Frequent or constant Supervision/Assistance  ?Patient can return home with the following A lot of help with walking and/or transfers;A lot of help with bathing/dressing/bathroom;Assist for transportation;Help with stairs or ramp for entrance;Assistance with cooking/housework ?  ?Equipment Recommendations ? None recommended by PT  ?  ?Recommendations for Other Services   ? ? ?  ?Precautions / Restrictions Precautions ?Precautions: Fall ?Precaution Comments: R LQ  ileostomy, ?Restrictions ?Weight Bearing Restrictions: No  ?  ? ?Mobility ? Bed Mobility ?  ?  ?  ?  ?  ?  ?  ?General bed mobility comments: on BSC on arrival ?  ? ?Transfers ?Overall transfer level: Needs assistance ?Equipment used: Rolling walker (2 wheels) ?Transfers: Sit to/from Stand ?Sit to Stand: Mod assist, +2 physical assistance, +2 safety/equipment ?  ?Step pivot transfers: Mod assist, +2 physical assistance ?  ?  ?  ?General transfer comment: mod assist of 2 to rise and steady. pt able  to transition  rom [facility] to recliner with 2 assist to balance and wt shift, verbal cues for sequencing ?  ? ?Ambulation/Gait ?  ?  ?  ?  ?  ?  ?  ?  ? ? ?Stairs ?  ?  ?  ?  ?  ? ? ?Wheelchair Mobility ?  ? ?Modified Rankin (Stroke Patients Only) ?  ? ? ?  ?Balance Overall balance assessment: Needs assistance ?Sitting-balance support: Feet supported, Bilateral upper extremity supported ?Sitting balance-Leahy Scale: Fair ?  ?  ?  ?Standing balance-Leahy Scale: Poor ?Standing balance comment: assist/support  for static standing ?  ?  ?  ?  ?  ?  ?  ?  ?  ?  ?  ?  ? ?  ?Cognition Arousal/Alertness: Awake/alert ?Behavior During Therapy: [facility] for tasks assessed/performed ?Overall Cognitive Status: Within Functional Limits for tasks assessed ?  ?  ?  ?  ?  ?  ?  ?  ?  ?  ?  ?  ?  ?  ?  ?  ?General Comments: son nt present this session however pt followed all commands and able to express her needs in English ?  ?  ? ?  ?Exercises   ?Bile  LE heel slides x 10 ?Ankle pumps/circles x 10 bil ?  ?General Comments   ?  ?  ? ?Pertinent Vitals/Pain Pain Assessment ?Pain Assessment: Faces ?Faces Pain Scale: Hurts even more ?Pain Location: back ?Pain Descriptors / Indicators: Grimacing ?Pain Intervention(s): Limited activity within patient's tolerance, Monitored during session, Repositioned  ? ? ?Home Living   ?  ?  ?  ?  ?  ?  ?  ?  ?  ?   ?  ?Prior Function    ?  ?  ?   ? ?PT Goals (current goals can now be found in the care plan section)  Acute Rehab PT Goals ?Patient Stated Goal: stop hurting and to rest ?PT Goal Formulation: With patient/family ?Time For Goal Achievement: 07/31/21 ?Potential to Achieve Goals: Fair ?Progress towards PT goals: Progressing toward goals ? ?  ?Frequency ? ? ? Min 2X/week ? ? ? ?  ?PT Plan Discharge plan needs to be updated  ? ? ?Co-evaluation   ?  ?  ?  ?  ? ?  ?AM-PAC PT "6 Clicks" Mobility   ?Outcome Measure ? Help needed turning from your back to your side while in a flat bed without using bedrails?: A Lot ?Help needed moving from lying on your back to sitting on the side of a flat bed without using bedrails?: A Lot ?Help needed moving to and from a bed to a chair (including a wheelchair)?: Total ?Help needed standing up from a chair using your arms (e.g., wheelchair or bedside chair)?: Total ?Help needed to walk in hospital room?: Total ?Help needed climbing 3-5 steps with a railing? : Total ?6 Click Score: 8 ? ?  ?End of Session   ?Activity Tolerance: Patient limited by fatigue ?Patient left: in chair;with call bell/phone within reach;with chair alarm set ?Nurse Communication: Mobility status ?PT Visit Diagnosis: Muscle weakness (generalized) (M62.81);Difficulty in walking, not elsewhere classified (R26.2);Other abnormalities of gait and mobility (R26.89) ?  ? ? ?Time: 1001-1013 ?PT Time Calculation (min) (ACUTE ONLY): 12 min ? ?Charges:  $Therapeutic Activity: 8-22 mins          ?          ? Baxter Flattery, PT ? ?Acute Rehab Dept [facility]) [phone] ?Pager [phone] ? ?07/19/2021 ? ? ? ?Lan Mcneill ?07/19/2021, 11:53 AM ? ?

## 2021-07-19 NOTE — Progress Notes (Signed)
Occupational Therapy Treatment ?Patient Details ?Name: Robin Estrada ?MRN: [id number] ?DOB: 05/06/1946 ?Today's Date: 07/19/2021 ? ? ?History of present illness 76 year old [name] with PMH significant of lung cancer, history of breast cancer, new vertebral masses presents in the ER with several days history of worsening abdominal pain, and nausea. CT abdomen with IV contrast showed large intussusception of the cecum and distal ileum within the length of the ascending colon and subsequent dilatation of the ascending colon. There are also noted various and numerous lytic lesions throughout the thoracolumbar spine and pelvis consistent with metastatic disease. General surgery was consulted.  She underwent laparoscopic right colectomy with end ileostomy on 06/29/2021.  She had a post-op cardiac arrest and inferior STEMI-- Cardiology consulted, Not a candidate for cardiac intervention ?  ?OT comments ? Patient in chair upon arrival and wanting to get back to bed, nurse tech in room. Patient with global weakness, significant L arm edema. Patient needing mod +2 to power up to standing from recliner chair and posterior lean. Patient attempting to sit before safely backed up to bed. Poor eccentric control when sitting, does not initiate reach back. Patient needing max +2 back to bed reporting significant pain in back, RN made aware. Patient is requiring increased assistance from OT eval and not progressing towards goals with goals downgraded. Patient appears to have guarded potential with therapy, tolerating minimal activity out of bed. Also HR increasing to 130s with transfers. Will continue to follow and recommend palliative/family meeting for [place].   ? ?Recommendations for follow up therapy are one component of a multi-disciplinary discharge planning process, led by the attending physician.  Recommendations may be updated based on patient status, additional functional criteria and insurance authorization. ?   ?Follow  Up Recommendations ? Skilled nursing-short term rehab (<3 hours/day)  ?  ?Assistance Recommended at Discharge Frequent or constant Supervision/Assistance  ?Patient can return home with the following ? Two people to help with walking and/or transfers;Two people to help with bathing/dressing/bathroom;Assistance with cooking/housework;Direct supervision/assist for medications management;Direct supervision/assist for financial management;Assist for transportation;Help with stairs or ramp for entrance ?  ?Equipment Recommendations ? BSC/3in1  ?  ?   ?Precautions / Restrictions Precautions ?Precautions: Fall ?Precaution Comments: R LQ ileostomy, ?Restrictions ?Weight Bearing Restrictions: No  ? ? ?  ? ?Mobility Bed Mobility ?Overal bed mobility: Needs Assistance ?Bed Mobility: Sit to Supine ?  ?  ?  ?Sit to supine: Max assist, +2 for physical assistance ?  ?General bed mobility comments: Patient needing assist to lift legs and guide trunk back to bed. ?  ? ? ?  ?Balance Overall balance assessment: Needs assistance ?  ?  ?  ?  ?Standing balance support: Reliant on assistive device for balance ?Standing balance-Leahy Scale: Zero ?Standing balance comment: walker and mod +2 ?  ?  ?  ?  ?  ?  ?  ?  ?  ?  ?  ?   ? ?ADL either performed or assessed with clinical judgement  ? ?ADL Overall ADL's : Needs assistance/impaired ?  ?  ?  ?  ?  ?  ?  ?  ?  ?  ?  ?  ?Toilet Transfer: Moderate assistance;+2 for physical assistance;+2 for safety/equipment;Stand-pivot;Cueing for safety;Cueing for sequencing;Rolling walker (2 wheels) ?Toilet Transfer Details (indicate cue type and reason): Patient needing mod A +2 to power up to standing from recliner. Patient with posterior bias and global weakness having difficulty maintaining upright posture. Patient attempting to sit needing cues to  redirect until safely backed up to recliner. Unable to reach back with poor eccenctric control onto bed. ?  ?  ?  ?  ?  ?  ?  ? ?Extremity/Trunk Assessment  Upper Extremity Assessment ?Upper Extremity Assessment: LUE deficits/detail ?LUE Deficits / Details: Patient with significant L UE edema. Propped on pillows once back to bed. ?  ?  ?  ?  ?  ? ? ?Cognition Arousal/Alertness: Awake/alert ?Behavior During Therapy: Flat affect ?Overall Cognitive Status: Within Functional Limits for tasks assessed ?  ?  ?  ?  ?  ?  ?  ?  ?  ?  ?  ?  ?  ?  ?  ?  ?  ?  ?  ?   ?   ?   ?   ? ? ?Pertinent Vitals/ Pain       Pain Assessment ?Pain Assessment: Faces ?Faces Pain Scale: Hurts whole lot ?Pain Location: back ?Pain Descriptors / Indicators: Grimacing ?Pain Intervention(s): Limited activity within patient's tolerance, Patient requesting pain meds-RN notified ? ?   ?   ? ?Frequency ? Min 2X/week  ? ? ? ? ?  ?Progress Toward Goals ? ?OT Goals(current goals can now be found in the care plan section) ? Progress towards OT goals: Not progressing toward goals - comment;Goals drowngraded-see care plan (global weakness needing increased assistance with mobility compared to initial evaluation) ? ?Acute Rehab OT Goals ?Patient Stated Goal: back to bed ?OT Goal Formulation: With patient ?Time For Goal Achievement: 08/02/21 ?Potential to Achieve Goals: Poor ?ADL Goals ?Pt Will Perform Lower Body Dressing: with min assist;sit to/from stand;sitting/lateral leans;with adaptive equipment ?Pt Will Transfer to Toilet: with min assist;stand pivot transfer;bedside commode ?Additional ADL Goal #1: Patient will participate in 8 minutes of standing activity in order to complete self care tasks.  ?Plan Discharge plan remains appropriate   ? ?   ?AM-PAC OT "6 Clicks" Daily Activity     ?Outcome Measure ? ? Help from another person eating meals?: A Little ?Help from another person taking care of personal grooming?: A Little ?Help from another person toileting, which includes using toliet, bedpan, or urinal?: Total ?Help from another person bathing (including washing, rinsing, drying)?: A Lot ?Help from another  person to put on and taking off regular upper body clothing?: A Little ?Help from another person to put on and taking off regular lower body clothing?: Total ?6 Click Score: 13 ? ?  ?End of Session Equipment Utilized During Treatment: Gait belt;Rolling walker (2 wheels) ? ?OT Visit Diagnosis: Unsteadiness on feet (R26.81);Other abnormalities of gait and mobility (R26.89);Muscle weakness (generalized) (M62.81) ?  ?Activity Tolerance Patient limited by pain ?  ?Patient Left in bed;with call bell/phone within reach;with bed alarm set;with nursing/sitter in room ?  ?Nurse Communication Mobility status;Patient requests pain meds ?  ? ?   ? ?Time: 6295-2841 ?OT Time Calculation (min): 10 min ? ?Charges: OT General Charges ?$OT Visit: 1 Visit ?OT Treatments ?$Self Care/Home Management : 8-22 mins ? ?Delbert Phenix OT ?OT pager: [phone] ? ? ?Rosemary Holms ?07/19/2021, 12:33 PM ?

## 2021-07-20 LAB — CBC WITH DIFFERENTIAL/PLATELET
Abs Immature Granulocytes: 1.16 10*3/uL — ABNORMAL HIGH (ref 0.00–0.07)
Basophils Absolute: 0.1 10*3/uL (ref 0.0–0.1)
Basophils Relative: 1 %
Eosinophils Absolute: 0.5 10*3/uL (ref 0.0–0.5)
Eosinophils Relative: 2 %
HCT: 30 % — ABNORMAL LOW (ref 36.0–46.0)
Hemoglobin: 9 g/dL — ABNORMAL LOW (ref 12.0–15.0)
Immature Granulocytes: 4 %
Lymphocytes Relative: 3 %
Lymphs Abs: 0.7 10*3/uL (ref 0.7–4.0)
MCH: 28.2 pg (ref 26.0–34.0)
MCHC: 30 g/dL (ref 30.0–36.0)
MCV: 94 fL (ref 80.0–100.0)
Monocytes Absolute: 1.1 10*3/uL — ABNORMAL HIGH (ref 0.1–1.0)
Monocytes Relative: 4 %
Neutro Abs: 24.2 10*3/uL — ABNORMAL HIGH (ref 1.7–7.7)
Neutrophils Relative %: 86 %
Platelets: 298 10*3/uL (ref 150–400)
RBC: 3.19 MIL/uL — ABNORMAL LOW (ref 3.87–5.11)
RDW: 22.4 % — ABNORMAL HIGH (ref 11.5–15.5)
WBC: 27.7 10*3/uL — ABNORMAL HIGH (ref 4.0–10.5)
nRBC: 0 % (ref 0.0–0.2)

## 2021-07-20 LAB — BASIC METABOLIC PANEL
Anion gap: 10 (ref 5–15)
BUN: 10 mg/dL (ref 8–23)
CO2: 24 mmol/L (ref 22–32)
Calcium: 9.4 mg/dL (ref 8.9–10.3)
Chloride: 102 mmol/L (ref 98–111)
Creatinine, Ser: 0.49 mg/dL (ref 0.44–1.00)
GFR, Estimated: >60 mL/min (ref >60)
Glucose, Bld: 117 mg/dL — ABNORMAL HIGH (ref 70–99)
Potassium: 3.7 mmol/L (ref 3.5–5.1)
Sodium: 136 mmol/L (ref 135–145)

## 2021-07-20 NOTE — Progress Notes (Signed)
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  CT abdomen with IV contrast showed large intussusception of the cecum and distal ileum within the length of the ascending colon and subsequent dilatation of the ascending colon. General surgery was consulted.  She underwent laparoscopic right colectomy with end ileostomy on 06/29/2021.She had a post-op cardiac arrest and inferior STEMI-- Cardiology consulted, Not a candidate for cardiac intervention.overall tolerating diet having colostomy output, alert awake slowly progressing-but he still having ongoing bone pain due to her metastatic disease, kyphoplasty was not approved by her insurance.  Patient remains deconditioned weak with overall poor prognosis, oncology has discussed some.  Would like to try rehabilitation at home, and possibly at the skilled nursing facility if he gets option with better rating.     Subjective: Seen and examined this morning.  Resting comfortably, offers no complaints. Colostomy with stool output. Overnight no fever heart rate 1 7110, saturating well on room air Labs shows elevated WBC count 21.1 Pro-Cal more or less same 0.7 stable renal function IV Dilaudid last used 3/8 and on oxycodone f q4hr prn 5 mg and on a scheduled Robaxin Cymbalta  Assessment and Plan:  Large cecal and distal ileum intussusception with bowel obstruction: Presented with severe abdominal pain with nausea and found to have intussusception.Status post laparoscopic right colectomy with end ileostomy 2/1, last CT abdomen 2/28-showed small areas of intussusception, and does have metastatic disease-she is poor repeat surgical surgical  candidate Has no abdominal pain, tolerating diet and having colostomy output so we will continue with current pathway she does have elevated WBC count-likely from her cancer, per [facility] less likely intra-abdominal source.    Acute hypoxic aspiratory failure Right pleural effusion with right lower lobe consolidation-likely atelectasis: Cxr With progression of opacity in the right lung base compatible with right pleural effusion and right lower lobe consolidation.Has been on Unasyn  since 3/6 and given persistent leukocytosis- checked procalcitonin and up marginally 0.7- 0.8. Switched antibiotics to Meropenem 3/8> after discussion with patient's son, oncology stopped antibiotics 3/10 -and monitoring, remains afebrile, encourage PT OT I-S OOB.  Leukocyte remains persistently elevated.  Acute blood loss anemia Anemia of chronic disease: Needed transfusion 1 u PRBC on 3/3 and IV iron on 3/4.  hb stable.monitor CBC. Recent Labs  Lab 07/16/21 0518 07/17/21 0518 07/18/21 0449 07/19/21 0451 07/20/21 0623  HGB 8.1* 9.3* 8.7* 8.8* 9.0*  HCT 27.0* 32.7* 29.4* 29.3* 30.0*     Inferior STEMI / Postop cardiac arrest  Ischemic cardiomyopathy; -Troponin peaked to 24,000, s/p TTE  w/LV ejection fraction of 50%, the left ventricle demonstrated RWMA.  Being treated with heparin gtt. now on Eliquis.refusing statin,zetia, imdur.Cardiology feels that she is a poor cath candidate and have signed off.   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  Pathologic fracture deformity at the level of T11 Generalized pain bone pain due to cancer and metastatic disease: She is s/p total mastectomy in 2019,  s/p CT-guided lung biopsy on 05/14/2021- results consistent of poorly  differentiated malignancy  with sarcomatoid and epithelioid features.  Continue with oral oxy and IV Dilaudid as needed.Insurances denied kyphoplasty-son was provided with number for initiating appeal. Starting OxyContin scheduled from 3/11 for cancer related pain.  Acute DVT of the left posterior tibial vein on 2/23: Continue Eliquis  Failure to thrive Goals of care Poor prognosis:  Full code currently. Extensive discussion by oncology, last meeting 3/10 and also seen by palliative care team.Patient/son is not accepting of hospice philosophy despite detailed discussions by her oncologist Dr. Burr Medico is following.  Patient and family at this time planning to return home with home health versus facility with rehab option  History of GI bleed hemoglobin is stable on PPI Essential hypertension: stable on low-dose Coreg. Cystic mass of pancreas previous CA 19-9 level normal follow-up outpatient.  PTA sacral decubitus:turn frequently  DVT prophylaxis: Place and maintain sequential compression device Start: 07/03/21 0815 Code Status:   Code Status: Full Code Family Communication: plan of care discussed with patient/Son at bedside 3/8, over the phone on 3/9. Called son no answer- left VM for call back.  Disposition:Currently not medically stable for discharge. Status is: Inpatient Remains inpatient appropriate because: Ongoing management of pain and multiple issues.   Patient remains deconditioned weak with overall poor prognosis, oncology has discussed some.  Would like to try rehabilitation at home, and possibly at the skilled nursing facility if he gets option with better rating.  If he decides to go home with hospice he will not be able to do until later this week as he is traveling out.  Objective: Vitals last 24 hrs: Vitals:   07/19/21 1300 07/19/21 2046 07/20/21 0500 07/20/21 0537  BP:  116/68  134/67  Pulse: (!) 106 (!) 106  (!) 107  Resp: 20 18  17   Temp:  98 F (36.7 C)  98.8 F  (37.1 C)  TempSrc:  Oral  Oral  SpO2: 96% 97%  95%  Weight:   60.8 kg   Height:       Weight change: -1.4 kg  Physical Examination:  General exam: Ill looking frail, older than stated age, weak appearing. HEENT:Oral mucosa moist, Ear/Nose WNL grossly, dentition normal. Respiratory system: bilaterally diminished at bases,no use of accessory muscle Cardiovascular system: S1 & S2 +, No JVD,. Gastrointestinal system: Abdomen soft, colostomy in place with stool output, NT,ND, BS+ Nervous System:Alert, awake, moving extremities and grossly nonfocal Extremities: edema  UE/LE-distal peripheral pulses palpable.  Skin: No rashes,no icterus. MSK: thin muscle bulk,tone, power  Medications reviewed:  Scheduled Meds:  (feeding supplement) PROSource Plus  30 mL Oral TID BM   apixaban  5 mg Oral BID   carvedilol  3.125 mg Oral BID WC   DULoxetine  20 mg Oral Daily   feeding supplement  1 Container Oral BID BM   lidocaine  1 patch Transdermal Q24H   methocarbamol  500 mg Oral TID   mirtazapine  15 mg Oral QHS   multivitamin with minerals  1 tablet Oral Daily   nutrition supplement (JUVEN)  1 packet Oral BID BM   oxyCODONE  10 mg Oral Q12H   pantoprazole  40 mg Oral QHS   Continuous Infusions:   Diet Order             Diet regular Room service appropriate? Yes; Fluid consistency: Thin  Diet effective now                  Nutrition Problem: Increased nutrient needs Etiology: cancer and cancer related  treatments, post-op healing Signs/Symptoms: estimated needs Interventions: [employer], [employer] Breeze, MVI, Refer to RD note for recommendations   Intake/Output Summary (Last 24 hours) at 07/20/2021 1059 Last data filed at 07/20/2021 0900 Gross per 24 hour  Intake 300 ml  Output 650 ml  Net -350 ml   Net IO Since Admission: 6,408.07 mL [07/20/21 1059]  Wt Readings from Last 3 Encounters:  07/20/21 60.8 kg  06/28/21 50.2 kg  06/10/21 54.8 kg     Unresulted  Labs (From admission, onward)     Start     Ordered   07/16/21 0500  CBC with Differential/Platelet  Daily,   R     Question:  Specimen collection method  Answer:  Lab=Lab collect   07/15/21 0614   Unscheduled  Occult blood card to lab, stool  As needed,   R      07/16/21 0803          Data Reviewed: I have personally reviewed following labs and imaging studies CBC: Recent Labs  Lab 07/16/21 0518 07/17/21 0518 07/18/21 0449 07/19/21 0451 07/20/21 0623  WBC 21.5* 25.6* 24.2* 27.1* 27.7*  NEUTROABS 18.3* 22.1* 20.7* 23.8* 24.2*  HGB 8.1* 9.3* 8.7* 8.8* 9.0*  HCT 27.0* 32.7* 29.4* 29.3* 30.0*  MCV 93.4 98.2 96.4 95.4 94.0  PLT 308 326 198 320 177   Basic Metabolic Panel: Recent Labs  Lab 07/16/21 0518 07/17/21 0518 07/18/21 0449 07/19/21 0451 07/20/21 0623  NA 131* 134* 133* 135 136  K 3.5 4.0 4.1 3.8 3.7  CL 99 101 102 102 102  CO2 24 22 22 22 24   GLUCOSE 120* 117* 92 107* 117*  BUN 10 10 9 10 10   CREATININE 0.43* 0.57 0.48 0.46 0.49  CALCIUM 8.9 9.6 9.3 9.0 9.4  MG  --  1.8  --   --   --    GFR: Estimated Creatinine Clearance: 48.7 mL/min (by C-G formula based on SCr of 0.49 mg/dL). Liver Function Tests: Recent Labs  Lab 07/14/21 0911 07/15/21 0500 07/16/21 0518 07/17/21 0518  AST 26 25 29  36  ALT 12 13 15 20   ALKPHOS 229* 241* 308* 396*  BILITOT 0.5 0.3 0.2* 0.3  PROT 5.2* 5.2* 4.9* 6.0*  ALBUMIN 1.6* 1.7* 1.6* 1.9*   No results for input(s): LIPASE, AMYLASE in the last 168 hours. No results for input(s): AMMONIA in the last 168 hours. Coagulation Profile: No results for input(s): INR, PROTIME in the last 168 hours.  Cardiac Enzymes: No results for input(s): CKTOTAL, CKMB, CKMBINDEX, TROPONINI in the last 168 hours. BNP (last 3 results) No results for input(s): PROBNP in the last 8760 hours. HbA1C: No results for input(s): HGBA1C in the last 72 hours. CBG: No results for input(s): GLUCAP in the last 168 hours. Lipid Profile: No results for  input(s): CHOL, HDL, LDLCALC, TRIG, CHOLHDL, LDLDIRECT in the last 72 hours. Thyroid Function Tests: No results for input(s): TSH, T4TOTAL, FREET4, T3FREE, THYROIDAB in the last 72 hours. Anemia Panel: No results for input(s): VITAMINB12, FOLATE, FERRITIN, TIBC, IRON, RETICCTPCT in the last 72 hours. Sepsis Labs: Recent Labs  Lab 07/17/21 0546 07/18/21 0449 07/19/21 0451  PROCALCITON 0.73 0.80 0.76    No results found for this or any previous visit (from the past 240 hour(s)).  Antimicrobials: Anti-infectives (From admission, onward)    Start     Dose/Rate Route Frequency Ordered Stop   07/17/21 2000  meropenem (MERREM) 1 g in sodium chloride 0.9 % 100 mL IVPB  Status:  Discontinued        1 g 200 mL/hr over 30 Minutes Intravenous Every 8 hours 07/17/21 1412 07/19/21 0856   07/15/21 0615  Ampicillin-Sulbactam (UNASYN) 3 g in sodium chloride 0.9 % 100 mL IVPB  Status:  Discontinued        3 g 200 mL/hr over 30 Minutes Intravenous Every 6 hours 07/15/21 0608 07/17/21 1411   06/28/21 2200  piperacillin-tazobactam (ZOSYN) IVPB 3.375 g  Status:  Discontinued        3.375 g 12.5 mL/hr over 240 Minutes Intravenous Every 8 hours 06/28/21 2120 07/04/21 0841      Culture/Microbiology    Component Value Date/Time   SDES URINE, CLEAN CATCH 05/03/2021 0539   SPECREQUEST NONE 05/03/2021 0539   CULT (A) 05/03/2021 0539    <10,000 COLONIES/mL INSIGNIFICANT GROWTH Performed at [facility] [address], [place], [place] [address]    REPTSTATUS 05/04/2021 FINAL 05/03/2021 0539  Other culture-see note 9 Radiology Studies:No results found.   LOS: 42 days   Antonieta Pert, MD [facility]  07/20/2021, 10:59 AM

## 2021-07-21 LAB — BASIC METABOLIC PANEL
Anion gap: 7 (ref 5–15)
BUN: 13 mg/dL (ref 8–23)
CO2: 26 mmol/L (ref 22–32)
Calcium: 9.5 mg/dL (ref 8.9–10.3)
Chloride: 102 mmol/L (ref 98–111)
Creatinine, Ser: 0.36 mg/dL — ABNORMAL LOW (ref 0.44–1.00)
GFR, Estimated: >60 mL/min (ref >60)
Glucose, Bld: 125 mg/dL — ABNORMAL HIGH (ref 70–99)
Potassium: 4 mmol/L (ref 3.5–5.1)
Sodium: 135 mmol/L (ref 135–145)

## 2021-07-21 LAB — CBC WITH DIFFERENTIAL/PLATELET
Abs Immature Granulocytes: 1.07 10*3/uL — ABNORMAL HIGH (ref 0.00–0.07)
Basophils Absolute: 0.1 10*3/uL (ref 0.0–0.1)
Basophils Relative: 0 %
Eosinophils Absolute: 0.3 10*3/uL (ref 0.0–0.5)
Eosinophils Relative: 1 %
HCT: 29.3 % — ABNORMAL LOW (ref 36.0–46.0)
Hemoglobin: 8.8 g/dL — ABNORMAL LOW (ref 12.0–15.0)
Immature Granulocytes: 4 %
Lymphocytes Relative: 3 %
Lymphs Abs: 0.7 10*3/uL (ref 0.7–4.0)
MCH: 28.5 pg (ref 26.0–34.0)
MCHC: 30 g/dL (ref 30.0–36.0)
MCV: 94.8 fL (ref 80.0–100.0)
Monocytes Absolute: 1.1 10*3/uL — ABNORMAL HIGH (ref 0.1–1.0)
Monocytes Relative: 4 %
Neutro Abs: 23.6 10*3/uL — ABNORMAL HIGH (ref 1.7–7.7)
Neutrophils Relative %: 88 %
Platelet Morphology: NORMAL
Platelets: 288 10*3/uL (ref 150–400)
RBC: 3.09 MIL/uL — ABNORMAL LOW (ref 3.87–5.11)
RDW: 21.9 % — ABNORMAL HIGH (ref 11.5–15.5)
WBC: 27 10*3/uL — ABNORMAL HIGH (ref 4.0–10.5)
nRBC: 0 % (ref 0.0–0.2)

## 2021-07-21 MED ORDER — FUROSEMIDE 10 MG/ML IJ SOLN
20.0000 mg | Freq: Once | INTRAMUSCULAR | Status: AC
  Administered 2021-07-21: 20 mg via INTRAVENOUS
  Filled 2021-07-21: qty 2

## 2021-07-21 NOTE — Progress Notes (Signed)
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  CT abdomen with IV contrast showed large intussusception of the cecum and distal ileum within the length of the ascending colon and subsequent dilatation of the ascending colon. General surgery was consulted.  She underwent laparoscopic right colectomy with end ileostomy on 06/29/2021.She had a post-op cardiac arrest and inferior STEMI-- Cardiology consulted, Not a candidate for cardiac intervention.overall tolerating diet having colostomy output, alert awake slowly progressing-but he still having ongoing bone pain due to her metastatic disease, kyphoplasty was not approved by her insurance.  Patient remains deconditioned weak with overall poor prognosis, oncology has discussed some.  Would like to try rehabilitation at home, and possibly at the skilled nursing facility if he gets option with better rating.     Subjective: Seen and examined this morning.  Seen resting comfortably not in distress Appears weak and frail Denies any pain, not much appetite-encouraged her to eat Overnight Patient is afebrile heart rate in low 100 saturating 96 to 98% on nasal cannula WBC count stable at 27 K Stable renal function  Assessment and Plan:  Large cecal and distal ileum intussusception with bowel obstruction: Presented with severe abdominal pain with nausea and found to have intussusception.Status post laparoscopic right colectomy with end ileostomy 2/1, last CT abdomen 2/28-showed small areas of intussusception, and does have metastatic disease-she is poor repeat surgical surgical candidate Has no abdominal pain, tolerating diet and having  colostomy output so we will continue with current supportive care. per [facility] less likely intra-abdominal source of leucocytosis.    Acute hypoxic aspiratory failure Right pleural effusion with right lower lobe consolidation-likely atelectasis: Cxr with progression of opacity in the right lung base compatible with right pleural effusion and right lower lobe consolidation-suspect this is due to pleural effusion, was on Unasyn 3/6- changed to Meropenem 3/8 and . After discussion with patient's son and oncology- we felt her persistent leukocytosis is due to metastatic cancer most likely, since no fever spike no cough or shortness of breath and now monitoring off antibiotics since 07/19/21.  Encourage I-S, OOB and monitor temperature curve and WBC count.    Acute blood loss anemia Anemia of chronic disease: Needed transfusion 1 u PRBC on 3/3 and IV iron on 3/4.  hb stable.monitor CBC. Recent Labs  Lab 07/17/21 0518 07/18/21 0449 07/19/21 0451 07/20/21 0623 07/21/21 0527  HGB 9.3* 8.7* 8.8* 9.0* 8.8*  HCT 32.7* 29.4* 29.3* 30.0* 29.3*     Inferior STEMI / Postop cardiac arrest  Ischemic cardiomyopathy: Troponin peaked to 24,000, s/p TTE  w/LV ejection fraction of 50%, the left ventricle demonstrated RWMA.  Being treated with heparin gtt. now on Eliquis.refusing statin,zetia, imdur.Cardiology feels that she is a poor cath candidate and have signed off.  Continue Eliquis and Coreg.   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  Pathologic fracture deformity at the level of T11 Generalized pain bone pain due to cancer and metastatic disease: s/p total mastectomy in 2019,  s/p CT-guided lung biopsy on 05/14/2021- results  consistent of poorly differentiated  malignancy with sarcomatoid and epithelioid features.  Intermittently needing IV Dilaudid for pain control, on oxycodone every 4 hours and started on scheduled oxycontin from 3/11 by Dr Burr Medico  Acute DVT of the left posterior tibial vein on 2/23: Continue Eliquis per pharmacy.  Failure to thrive Goals of care Poor prognosis:  Full code currently. Extensive discussion by oncology, last meeting 3/10 and also seen by palliative care team.Patient/son is not accepting of hospice philosophy despite detailed discussions by her oncologist Dr. Burr Medico is following.  Patient and family at this time planning to return home with home health versus facility with rehab option  History of GI bleed hemoglobin is stable on PPI Essential hypertension: stable on low-dose Coreg. Cystic mass of pancreas previous CA 19-9 level normal follow-up outpatient.  PTA sacral decubitus:turn frequently  DVT prophylaxis: Place and maintain sequential compression device Start: 07/03/21 0815 Code Status:   Code Status: Full Code Family Communication: plan of care discussed with patient/Son at bedside 3/8, over the phone on 3/9. Called son no answer- left VM for call back 3/12-we will attempt LATER.  Disposition:Currently not medically stable for discharge. Status is: Inpatient Remains inpatient appropriate because: Ongoing management of pain and multiple issues.   Patient remains deconditioned weak with overall poor prognosis, oncology has discussed some.  Would like to try rehabilitation at home, and possibly at the skilled nursing facility if he gets option with better rating.  If he decides to go home with hospice he will not be able to do until later this week as he is traveling out.  Objective: Vitals last 24 hrs: Vitals:   07/20/21 1408 07/20/21 1922 07/21/21 0500 07/21/21 0603  BP: (!) 130/53 (!) 104/55  (!) 106/57  Pulse: (!) 109 (!) 105  (!) 102  Resp: 16 16  15   Temp: (!) 97.5 F (36.4 C) 98.3 F (36.8 C)  (!)  97.5 F (36.4 C)  TempSrc: Oral Oral  Oral  SpO2: 98% 97%  96%  Weight:   60.8 kg   Height:       Weight change: 0 kg  Physical Examination: General exam: Ill-appearing weak, not in distress, older than stated age, weak appearing. HEENT:Oral mucosa moist, Ear/Nose WNL grossly, dentition normal. Respiratory system: bilaterally diminished, no use of accessory muscle Cardiovascular system: S1 & S2 +, No JVD,. Gastrointestinal system: Abdomen soft,NT,ND,BS+ Nervous System:Alert, awake, moving extremities and grossly nonfocal Extremities: Upper and lower extremity edema,distal peripheral pulses palpable.  Skin: No rashes,no icterus. MSK: Normal muscle bulk,tone, power  Medications reviewed:  Scheduled Meds:  (feeding supplement) PROSource Plus  30 mL Oral TID BM   apixaban  5 mg Oral BID   carvedilol  3.125 mg Oral BID WC   DULoxetine  20 mg Oral Daily   feeding supplement  1 Container Oral BID BM   lidocaine  1 patch Transdermal Q24H   methocarbamol  500 mg Oral TID   mirtazapine  15 mg Oral QHS   multivitamin with minerals  1 tablet Oral Daily   nutrition supplement (JUVEN)  1 packet Oral BID BM   oxyCODONE  10 mg Oral Q12H   pantoprazole  40 mg Oral QHS   Continuous Infusions:   Diet Order             Diet regular Room service appropriate? Yes; Fluid consistency: Thin  Diet effective now                  Nutrition  Problem: Increased nutrient needs Etiology: cancer and cancer related treatments, post-op healing Signs/Symptoms: estimated needs Interventions: [employer], [employer], MVI, Refer to RD note for recommendations   Intake/Output Summary (Last 24 hours) at 07/21/2021 1134 Last data filed at 07/20/2021 1654 Gross per 24 hour  Intake 120 ml  Output 125 ml  Net -5 ml   Net IO Since Admission: 6,403.07 mL [07/21/21 1134]  Wt Readings from Last 3 Encounters:  07/21/21 60.8 kg  06/28/21 50.2 kg  06/10/21 54.8 kg     Unresulted Labs  (From admission, onward)    None     Data Reviewed: I have personally reviewed following labs and imaging studies CBC: Recent Labs  Lab 07/17/21 0518 07/18/21 0449 07/19/21 0451 07/20/21 0623 07/21/21 0527  WBC 25.6* 24.2* 27.1* 27.7* 27.0*  NEUTROABS 22.1* 20.7* 23.8* 24.2* 23.6*  HGB 9.3* 8.7* 8.8* 9.0* 8.8*  HCT 32.7* 29.4* 29.3* 30.0* 29.3*  MCV 98.2 96.4 95.4 94.0 94.8  PLT 326 198 320 298 092   Basic Metabolic Panel: Recent Labs  Lab 07/17/21 0518 07/18/21 0449 07/19/21 0451 07/20/21 0623 07/21/21 0527  NA 134* 133* 135 136 135  K 4.0 4.1 3.8 3.7 4.0  CL 101 102 102 102 102  CO2 22 22 22 24 26   GLUCOSE 117* 92 107* 117* 125*  BUN 10 9 10 10 13   CREATININE 0.57 0.48 0.46 0.49 0.36*  CALCIUM 9.6 9.3 9.0 9.4 9.5  MG 1.8  --   --   --   --    GFR: Estimated Creatinine Clearance: 48.7 mL/min (A) (by C-G formula based on SCr of 0.36 mg/dL (L)). Liver Function Tests: Recent Labs  Lab 07/15/21 0500 07/16/21 0518 07/17/21 0518  AST 25 29 36  ALT 13 15 20   ALKPHOS 241* 308* 396*  BILITOT 0.3 0.2* 0.3  PROT 5.2* 4.9* 6.0*  ALBUMIN 1.7* 1.6* 1.9*   No results for input(s): LIPASE, AMYLASE in the last 168 hours. No results for input(s): AMMONIA in the last 168 hours. Coagulation Profile: No results for input(s): INR, PROTIME in the last 168 hours.  Cardiac Enzymes: No results for input(s): CKTOTAL, CKMB, CKMBINDEX, TROPONINI in the last 168 hours. BNP (last 3 results) No results for input(s): PROBNP in the last 8760 hours. HbA1C: No results for input(s): HGBA1C in the last 72 hours. CBG: No results for input(s): GLUCAP in the last 168 hours. Lipid Profile: No results for input(s): CHOL, HDL, LDLCALC, TRIG, CHOLHDL, LDLDIRECT in the last 72 hours. Thyroid Function Tests: No results for input(s): TSH, T4TOTAL, FREET4, T3FREE, THYROIDAB in the last 72 hours. Anemia Panel: No results for input(s): VITAMINB12, FOLATE, FERRITIN, TIBC, IRON, RETICCTPCT in the  last 72 hours. Sepsis Labs: Recent Labs  Lab 07/17/21 0546 07/18/21 0449 07/19/21 0451  PROCALCITON 0.73 0.80 0.76    No results found for this or any previous visit (from the past 240 hour(s)).  Antimicrobials: Anti-infectives (From admission, onward)    Start     Dose/Rate Route Frequency Ordered Stop   07/17/21 2000  meropenem (MERREM) 1 g in sodium chloride 0.9 % 100 mL IVPB  Status:  Discontinued        1 g 200 mL/hr over 30 Minutes Intravenous Every 8 hours 07/17/21 1412 07/19/21 0856   07/15/21 0615  Ampicillin-Sulbactam (UNASYN) 3 g in sodium chloride 0.9 % 100 mL IVPB  Status:  Discontinued        3 g 200 mL/hr over 30 Minutes Intravenous Every  6 hours 07/15/21 0608 07/17/21 1411   06/28/21 2200  piperacillin-tazobactam (ZOSYN) IVPB 3.375 g  Status:  Discontinued        3.375 g 12.5 mL/hr over 240 Minutes Intravenous Every 8 hours 06/28/21 2120 07/04/21 0841      Culture/Microbiology    Component Value Date/Time   SDES URINE, CLEAN CATCH 05/03/2021 0539   SPECREQUEST NONE 05/03/2021 0539   CULT (A) 05/03/2021 0539    <10,000 COLONIES/mL INSIGNIFICANT GROWTH Performed at [facility] [address] [place], [place] [address]    REPTSTATUS 05/04/2021 FINAL 05/03/2021 0539  Other culture-see note 9 Radiology Studies:No results found.   LOS: 23 days   Antonieta Pert, MD [facility]  07/21/2021, 11:34 AM

## 2021-07-21 NOTE — Progress Notes (Signed)
Patient is more tired and lethargic today. Left arm more swollen, no complaints on pain to it. Spoke with son today. MD made aware. No new orders.  ?

## 2021-07-22 ENCOUNTER — Inpatient Hospital Stay (HOSPITAL_COMMUNITY): Payer: Medicare HMO

## 2021-07-22 DIAGNOSIS — C799 Secondary malignant neoplasm of unspecified site: Secondary | ICD-10-CM

## 2021-07-22 DIAGNOSIS — J90 Pleural effusion, not elsewhere classified: Secondary | ICD-10-CM

## 2021-07-22 DIAGNOSIS — R0603 Acute respiratory distress: Secondary | ICD-10-CM

## 2021-07-22 DIAGNOSIS — C50919 Malignant neoplasm of unspecified site of unspecified female breast: Secondary | ICD-10-CM

## 2021-07-22 LAB — COMPREHENSIVE METABOLIC PANEL
ALT: 32 U/L (ref 0–44)
AST: 65 U/L — ABNORMAL HIGH (ref 15–41)
Albumin: 1.7 g/dL — ABNORMAL LOW (ref 3.5–5.0)
Alkaline Phosphatase: 416 U/L — ABNORMAL HIGH (ref 38–126)
Anion gap: 10 (ref 5–15)
BUN: 13 mg/dL (ref 8–23)
CO2: 25 mmol/L (ref 22–32)
Calcium: 9.4 mg/dL (ref 8.9–10.3)
Chloride: 99 mmol/L (ref 98–111)
Creatinine, Ser: 0.37 mg/dL — ABNORMAL LOW (ref 0.44–1.00)
GFR, Estimated: >60 mL/min (ref >60)
Glucose, Bld: 128 mg/dL — ABNORMAL HIGH (ref 70–99)
Potassium: 3.4 mmol/L — ABNORMAL LOW (ref 3.5–5.1)
Sodium: 134 mmol/L — ABNORMAL LOW (ref 135–145)
Total Bilirubin: 0.5 mg/dL (ref 0.3–1.2)
Total Protein: 5.1 g/dL — ABNORMAL LOW (ref 6.5–8.1)

## 2021-07-22 LAB — BLOOD GAS, ARTERIAL
Acid-Base Excess: 4.7 mmol/L — ABNORMAL HIGH (ref 0.0–2.0)
Bicarbonate: 29.2 mmol/L — ABNORMAL HIGH (ref 20.0–28.0)
FIO2: 28 %
O2 Content: 2 L/min
O2 Saturation: 98.5 %
Patient temperature: 37
pCO2 arterial: 42 mmHg (ref 32–48)
pH, Arterial: 7.45 (ref 7.35–7.45)
pO2, Arterial: 89 mmHg (ref 83–108)

## 2021-07-22 LAB — CBC
HCT: 30.7 % — ABNORMAL LOW (ref 36.0–46.0)
Hemoglobin: 9.2 g/dL — ABNORMAL LOW (ref 12.0–15.0)
MCH: 28.5 pg (ref 26.0–34.0)
MCHC: 30 g/dL (ref 30.0–36.0)
MCV: 95 fL (ref 80.0–100.0)
Platelets: 258 10*3/uL (ref 150–400)
RBC: 3.23 MIL/uL — ABNORMAL LOW (ref 3.87–5.11)
RDW: 21.3 % — ABNORMAL HIGH (ref 11.5–15.5)
WBC: 30.7 10*3/uL — ABNORMAL HIGH (ref 4.0–10.5)
nRBC: 0 % (ref 0.0–0.2)

## 2021-07-22 LAB — GLUCOSE, CAPILLARY: Glucose-Capillary: 126 mg/dL — ABNORMAL HIGH (ref 70–99)

## 2021-07-22 LAB — MRSA NEXT GEN BY PCR, NASAL: MRSA by PCR Next Gen: NOT DETECTED

## 2021-07-22 LAB — PROCALCITONIN: Procalcitonin: 0.98 ng/mL

## 2021-07-22 LAB — LACTIC ACID, PLASMA: Lactic Acid, Venous: 1.6 mmol/L (ref 0.5–1.9)

## 2021-07-22 MED ORDER — PIPERACILLIN-TAZOBACTAM 3.375 G IVPB
3.3750 g | Freq: Three times a day (TID) | INTRAVENOUS | Status: DC
  Administered 2021-07-22 – 2021-07-25 (×10): 3.375 g via INTRAVENOUS
  Filled 2021-07-22 (×10): qty 50

## 2021-07-22 MED ORDER — CHLORHEXIDINE GLUCONATE CLOTH 2 % EX PADS
6.0000 | MEDICATED_PAD | Freq: Every day | CUTANEOUS | Status: DC
  Administered 2021-07-22 – 2021-07-24 (×3): 6 via TOPICAL

## 2021-07-22 MED ORDER — ORAL CARE MOUTH RINSE
15.0000 mL | Freq: Two times a day (BID) | OROMUCOSAL | Status: DC
  Administered 2021-07-22 – 2021-07-25 (×5): 15 mL via OROMUCOSAL

## 2021-07-22 MED ORDER — KETOROLAC TROMETHAMINE 15 MG/ML IJ SOLN
7.5000 mg | Freq: Four times a day (QID) | INTRAMUSCULAR | Status: DC | PRN
  Administered 2021-07-22 – 2021-07-24 (×3): 7.5 mg via INTRAVENOUS
  Filled 2021-07-22 (×3): qty 1

## 2021-07-22 MED ORDER — POTASSIUM CHLORIDE 10 MEQ/100ML IV SOLN
10.0000 meq | INTRAVENOUS | Status: AC
  Administered 2021-07-22 (×2): 10 meq via INTRAVENOUS
  Filled 2021-07-22 (×2): qty 100

## 2021-07-22 NOTE — Progress Notes (Signed)
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  CT abdomen with IV contrast showed large intussusception of the cecum and distal ileum within the length of the ascending colon and subsequent dilatation of the ascending colon. General surgery was consulted.  She underwent laparoscopic right colectomy with end ileostomy on 06/29/2021.She had a post-op cardiac arrest and inferior STEMI-- Cardiology consulted, Not a candidate for cardiac intervention.overall tolerating diet having colostomy output, alert awake slowly progressing-but he still having ongoing bone pain due to her metastatic disease, kyphoplasty was not approved by her insurance.  Patient remains deconditioned weak with overall poor prognosis, oncology has discussed some.  Would like to try rehabilitation at home, and possibly at the skilled nursing facility if he gets option with better rating.     Subjective: Seen urgently this am as rapid response for tachypnea tachycardia shallow breathing and she was lethargic at 8 am. Able to tell me her name, but weak lethargic On2l [place] spot at 96% Patient moved to stepdown unit where I also examined her second time she is now more restful, bedside in person translator in place-" I just want to close my eyes and rest".  Denies pain nausea vomiting or shortness of breath  Assessment and Plan:  Large cecal and distal ileum intussusception with bowel obstruction: Presented with severe abdominal pain with nausea and found to have intussusception s/p laparoscopic right colectomy with end ileostomy 2/1, last CT abdomen 2/28-showed small areas of intussusception, and does have metastatic  disease-she is poor repeat surgical surgical candidate.  Having colostomy output, x-ray abdomen this morning-ER any stool in the rectum paucity of bowel gas no definitive acute finding.  Continues to have poor oral intake failure to thrive no appetite. Per CCS less likely intra-abdominal source of leucocytosis.    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  Pathologic fracture deformity at the level of T11 Cancer related pain:  s/p total mastectomy in 2019,  s/p CT-guided lung biopsy on 05/14/2021- results consistent of poorly differentiated malignancy with sarcomatoid and epithelioid features.  Was placed on OxyContin 3/11-but since she has been lethargic discontinued 3/2 LAM.  Continue p.o. oxy as needed, reserve Dilaudid for severe pain.  Acute hypoxic aspiratory failure Persistent right pleural effusion with right lower lobe consolidation-likely atelectasis Emphysema of the lung: Needing 2 L nasal cannula but not in distress, had shallow breathing/tachypnea this morning.CXR 3/13-shows similar large right pleural effusion with atelectasis/infiltrate.  S/P IV Lasix 3/12, will repeat x1.Continue supplemental oxygen, she has been mostly laying in the bed.has persistent leukocytosis-Pro-Cal borderline 0.9-not significantly elevated-Was previously on Unasyn and then meropenem and on hold since 3/10. we will resume empiric Zosyn-discussed with Dr. Lucas Mallow benefit with thoracentesis consult pulmonary-ruled effusion could be malignant given HER right lung mets.   Acute blood loss anemia Anemia of chronic disease: Needed transfusion 1 u PRBC on 3/3 and IV iron on 3/4.hb stable. Recent Labs  Lab 07/18/21 0449  07/19/21 0451 07/20/21 0623 07/21/21 0527 07/22/21 0829  HGB 8.7* 8.8* 9.0* 8.8* 9.2*  HCT 29.4* 29.3* 30.0* 29.3* 30.7*    Inferior STEMI / Postop cardiac arrest  Ischemic cardiomyopathy: Troponin peaked to 24,000,TTE  w/LV ejection fraction of 50%, the left ventricle demonstrated RWMA.  S/p heparin gtt>now on Eliquis, coreg low dose,.refusing statin,zetia, imdur.Cardiology feels that she is a poor cath candidate and have signed off.     Acute DVT of the left posterior tibial vein on 2/23: Continue Eliquis  History of GI bleed -stable. Cont PPI Essential hypertension: stable on low-dose Coreg.  Cystic mass of pancreas Finding of hepatic cirrhosis on previous CT imaging  previous CA 19-9 level normal follow-up outpatient. PTA sacral decubitus:turn frequently  Failure to thrive Goals of care Poor prognosis:  Full code currently.Extensive discussion by oncology, last meeting 3/10 and also seen by palliative care team.Patient/son is not accepting of hospice philosophy despite detailed discussions by her oncologist. Dr. Burr Medico is following.Patient moved to stepdown unit, I have put in repeat consult for palliative care.Updated patient's sons that overall prognosis remains guarded and she is not doing well.Discussed with Dr. Burr Medico, advised residential hospice. I spoke to [place] again after labs xray were back-I strongly encouraged him to consider DNR-he mentioned he is leaning towards that but he would like to talk to his sister before making decision.    DVT prophylaxis: Place and maintain sequential compression device Start: 07/03/21 0815 Eliquis. Code Status:   Code Status: Full Code Family Communication: plan of care discussed with patient.  Discussed with son multiple times at bedside and also over the phone.  Updated this morning about new changes 3/13.  Son is traveling out of state currently. Called her son again- asked abt code status and I strongly encouraged him to consider DNR-he  mention he is leaning towards that but he would like to talk to his sister before making decision.  He agrees with thoracentesis if pulmonary feels safe to do, and also antibiotics  Disposition:Currently not medically stable for discharge. Status is: Inpatient Remains inpatient appropriate because: Ongoing management of pain and multiple issues.  Objective: Vitals last 24 hrs: Vitals:   07/22/21 0500 07/22/21 0743 07/22/21 0800 07/22/21 0832  BP: 129/63 (!) 116/58  (!) 124/46  Pulse: (!) 110 (!) 115  (!) 104  Resp: 16 (!) 32  (!) 24  Temp: 99.3 F (37.4 C) 97.8 F (36.6 C) 100.2 F (37.9 C) 100.2 F (37.9 C)  TempSrc: Tympanic Oral Rectal Rectal  SpO2: 96% 96%  99%  Weight: 59.5 kg     Height:       Weight change: -1.3 kg  Physical Examination: General exam: Frail ill looking, lethargic,older than stated age, weak appearing. HEENT:Oral mucosa moist, Ear/Nose WNL grossly, dentition normal. Respiratory system: bilaterally diminished breath sounds,,no use of accessory muscle Cardiovascular system: S1 & S2 +, No JVD,. Gastrointestinal system: Abdomen soft,NT,ND, BS+, colostomy with stool in place Nervous System:Alert, awake, moving extremities and grossly nonfocal Extremities: LUE swollen, trace edema legs. Skin: No rashes,no icterus. MSK: thin muscle bulk,tone, power  Medications reviewed:  Scheduled Meds:  (feeding supplement) PROSource Plus  30 mL Oral TID BM   apixaban  5 mg Oral BID   carvedilol  3.125 mg Oral BID WC   Chlorhexidine Gluconate Cloth  6 each Topical Daily   DULoxetine  20 mg Oral Daily   feeding supplement  1 Container Oral BID BM   lidocaine  1 patch Transdermal Q24H   mouth rinse  15 mL Mouth Rinse BID   methocarbamol  500 mg Oral  TID   mirtazapine  15 mg Oral QHS   multivitamin with minerals  1 tablet Oral Daily   nutrition supplement (JUVEN)  1 packet Oral BID BM   pantoprazole  40 mg Oral QHS   Continuous Infusions:  piperacillin-tazobactam  (ZOSYN)  IV     potassium chloride 10 mEq (07/22/21 1027)    Diet Order             Diet regular Room service appropriate? Yes; Fluid consistency: Thin  Diet effective now                  Nutrition Problem: Increased nutrient needs Etiology: cancer and cancer related treatments, post-op healing Signs/Symptoms: estimated needs Interventions: [employer], [place], MVI, Refer to RD note for recommendations   Intake/Output Summary (Last 24 hours) at 07/22/2021 1102 Last data filed at 07/22/2021 0022 Gross per 24 hour  Intake 120 ml  Output 2050 ml  Net -1930 ml   Net IO Since Admission: 4,473.07 mL [07/22/21 1102]  Wt Readings from Last 3 Encounters:  07/22/21 59.5 kg  06/28/21 50.2 kg  06/10/21 54.8 kg     Unresulted Labs (From admission, onward)     Start     Ordered   07/23/21 0500  Procalcitonin  Daily,   R     Question:  Specimen collection method  Answer:  Lab=Lab collect   07/22/21 0846   07/23/21 0500  Comprehensive metabolic panel  Daily,   R     Question:  Specimen collection method  Answer:  Lab=Lab collect   07/22/21 0942   07/23/21 0500  CBC  Daily,   R     Question:  Specimen collection method  Answer:  Lab=Lab collect   07/22/21 0942          Data Reviewed: I have personally reviewed following labs and imaging studies CBC: Recent Labs  Lab 07/17/21 0518 07/18/21 0449 07/19/21 0451 07/20/21 0623 07/21/21 0527 07/22/21 0829  WBC 25.6* 24.2* 27.1* 27.7* 27.0* 30.7*  NEUTROABS 22.1* 20.7* 23.8* 24.2* 23.6*  --   HGB 9.3* 8.7* 8.8* 9.0* 8.8* 9.2*  HCT 32.7* 29.4* 29.3* 30.0* 29.3* 30.7*  MCV 98.2 96.4 95.4 94.0 94.8 95.0  PLT 326 198 320 298 288 175   Basic Metabolic Panel: Recent Labs  Lab 07/17/21 0518 07/18/21 0449 07/19/21 0451 07/20/21 0623 07/21/21 0527 07/22/21 0829  NA 134* 133* 135 136 135 134*  K 4.0 4.1 3.8 3.7 4.0 3.4*  CL 101 102 102 102 102 99  CO2 22 22 22 24 26 25   GLUCOSE 117* 92 107* 117* 125*  128*  BUN 10 9 10 10 13 13   CREATININE 0.57 0.48 0.46 0.49 0.36* 0.37*  CALCIUM 9.6 9.3 9.0 9.4 9.5 9.4  MG 1.8  --   --   --   --   --    GFR: Estimated Creatinine Clearance: 48.3 mL/min (A) (by C-G formula based on SCr of 0.37 mg/dL (L)). Liver Function Tests: Recent Labs  Lab 07/16/21 0518 07/17/21 0518 07/22/21 0829  AST 29 36 65*  ALT 15 20 32  ALKPHOS 308* 396* 416*  BILITOT 0.2* 0.3 0.5  PROT 4.9* 6.0* 5.1*  ALBUMIN 1.6* 1.9* 1.7*   No results for input(s): LIPASE, AMYLASE in the last 168 hours. No results for input(s): AMMONIA in the last 168 hours. Coagulation Profile: No results for input(s): INR, PROTIME in the last 168 hours.  Cardiac Enzymes: No results for input(s): CKTOTAL,  CKMB, CKMBINDEX, TROPONINI in the last 168 hours. BNP (last 3 results) No results for input(s): PROBNP in the last 8760 hours. HbA1C: No results for input(s): HGBA1C in the last 72 hours. CBG: Recent Labs  Lab 07/22/21 0733  GLUCAP 126*   Lipid Profile: No results for input(s): CHOL, HDL, LDLCALC, TRIG, CHOLHDL, LDLDIRECT in the last 72 hours. Thyroid Function Tests: No results for input(s): TSH, T4TOTAL, FREET4, T3FREE, THYROIDAB in the last 72 hours. Anemia Panel: No results for input(s): VITAMINB12, FOLATE, FERRITIN, TIBC, IRON, RETICCTPCT in the last 72 hours. Sepsis Labs: Recent Labs  Lab 07/17/21 0546 07/18/21 0449 07/19/21 0451 07/22/21 0825 07/22/21 0908  PROCALCITON 0.73 0.80 0.76  --  0.98  LATICACIDVEN  --   --   --  1.6  --     Recent Results (from the past 240 hour(s))  MRSA Next Gen by PCR, Nasal     Status: None   Collection Time: 07/22/21  8:31 AM   Specimen: Nasal Mucosa; Nasal Swab  Result Value Ref Range Status   MRSA by PCR Next Gen NOT DETECTED NOT DETECTED Final    Comment: (NOTE) The GeneXpert MRSA Assay (FDA approved for NASAL specimens only), is one component of a comprehensive MRSA colonization surveillance program. It is not intended to  diagnose MRSA infection nor to guide or monitor treatment for MRSA infections. Test performance is not FDA approved in patients less than 12 years old. Performed at [facility], [place] [address], [place], [place] [address]     Antimicrobials: Anti-infectives (From admission, onward)    Start     Dose/Rate Route Frequency Ordered Stop   07/22/21 1200  piperacillin-tazobactam (ZOSYN) IVPB 3.375 g        3.375 g 12.5 mL/hr over 240 Minutes Intravenous Every 8 hours 07/22/21 1059     07/17/21 2000  meropenem (MERREM) 1 g in sodium chloride 0.9 % 100 mL IVPB  Status:  Discontinued        1 g 200 mL/hr over 30 Minutes Intravenous Every 8 hours 07/17/21 1412 07/19/21 0856   07/15/21 0615  Ampicillin-Sulbactam (UNASYN) 3 g in sodium chloride 0.9 % 100 mL IVPB  Status:  Discontinued        3 g 200 mL/hr over 30 Minutes Intravenous Every 6 hours 07/15/21 0608 07/17/21 1411   06/28/21 2200  piperacillin-tazobactam (ZOSYN) IVPB 3.375 g  Status:  Discontinued        3.375 g 12.5 mL/hr over 240 Minutes Intravenous Every 8 hours 06/28/21 2120 07/04/21 0841      Culture/Microbiology    Component Value Date/Time   SDES URINE, CLEAN CATCH 05/03/2021 0539   SPECREQUEST NONE 05/03/2021 0539   CULT (A) 05/03/2021 0539    <10,000 COLONIES/mL INSIGNIFICANT GROWTH Performed at [facility] [address] [place], [place] [address]    REPTSTATUS 05/04/2021 FINAL 05/03/2021 0539  Other culture-see note 9 Radiology Studies:DG Abd 1 View  Result Date: 07/22/2021 CLINICAL DATA:  Abdominal pain. EXAM: ABDOMEN - 1 VIEW COMPARISON:  CT abdomen pelvis dated July 09, 2021. FINDINGS: Paucity of bowel gas. Air and stool in the rectum. No radio-opaque calculi or other significant radiographic abnormality are seen. Scattered lytic lesions in the bony pelvis again noted. Chronic T11 compression deformity again noted. IMPRESSION: 1. Paucity of bowel gas. No definite acute abnormality.  Electronically Signed   By: Titus Dubin M.D.   On: 07/22/2021 09:35   DG Chest [facility] 1 View  Result  Date: 07/22/2021 CLINICAL DATA:  Shortness of breath EXAM: PORTABLE CHEST 1 VIEW COMPARISON:  Chest x-ray 07/17/2021 FINDINGS: Cardiomediastinal silhouette appears grossly unchanged. Persistent large right pleural effusion with associated atelectasis/infiltrate. No new consolidation identified. No pneumothorax. IMPRESSION: No significant change since previous study. Electronically Signed   By: Ofilia Neas M.D.   On: 07/22/2021 08:57     LOS: 24 days   Antonieta Pert, MD [facility]  07/22/2021, 11:02 AM

## 2021-07-22 NOTE — Progress Notes (Signed)
Responded to consult for IV. Assessed for midline due to bruising and edema on forearms. Veins not large enough for even the smallest available midline. 22G placed. Advise considering CVC if additional access is needed. ?
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
## 2021-07-22 NOTE — Consult Note (Signed)
NAMEWenonah Estrada, MRN:  [id number], DOB:  Oct 08, 1945, LOS: 24 ADMISSION DATE:  06/28/2021, CONSULTATION DATE:  07/22/2021 REFERRING MD:  Dr. 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  Found to have intussusception of cecum and distal ileum.  Had laparoscopic Rt colectomy and end ileostomy.  Had post op cardiac arrest from inferior STEMI.  Had some clinical improvement.  Developed tachypnea and tachycardia on 3/13 with lethargy.  Transferred to SDU.  CXR from 07/14/21 showed opacity of Rt lung base with ATX and effusion which persistent on CXR from 07/22/21.  PCCM asked to assess for thoracentesis.  Pertinent  Medical History  GI bleeding, Headache, HTN, MVP, TB as a child, Lung cancer, Breast cancer  Interim History / Subjective:  Somnolent, and not able to provide history.  Objective   Blood pressure (!) 124/46, pulse (!) 104, temperature 98.6 F (37 C), temperature source Axillary, resp. rate (!) 24, height 5' (1.524 m), weight 59.5 kg, SpO2 99 %.        Intake/Output Summary (Last 24 hours) at 07/22/2021 1325 Last data filed at 07/22/2021 0022 Gross per 24 hour  Intake 120 ml  Output 2050 ml  Net -1930 ml   Filed Weights   07/20/21 0500 07/21/21 0500 07/22/21 0500  Weight: 60.8 kg 60.8 kg 59.5 kg    Examination:  General - somnolent Eyes - pupils reactive ENT - no sinus tenderness, no stridor Cardiac - regular, tachycardic Chest - decreased BS at bases Abdomen - soft, non tender, colostomy in place Extremities - 1+ edema Skin - no rashes Neuro - wakes up briefly with stimulation   Assessment & Plan:   Rt pleural effusion and atelectasis with Rt lung mass. - will need to arrange for non contrast CT chest to assess further before deciding whether she needs additional interventions  Metastatic breast cancer. - diffuse  metastatic lesions in thoracic and lumbar spine, and epidural spread of tumor at L4  Intussusception s/p colectomy and end ileostomy. - per primary team  Lt leg DVT. - on eliquis >> last dose in PM of 3/12   Labs   CBC: Recent Labs  Lab 07/17/21 0518 07/18/21 0449 07/19/21 0451 07/20/21 0623 07/21/21 0527 07/22/21 0829  WBC 25.6* 24.2* 27.1* 27.7* 27.0* 30.7*  NEUTROABS 22.1* 20.7* 23.8* 24.2* 23.6*  --   HGB 9.3* 8.7* 8.8* 9.0* 8.8* 9.2*  HCT 32.7* 29.4* 29.3* 30.0* 29.3* 30.7*  MCV 98.2 96.4 95.4 94.0 94.8 95.0  PLT 326 198 320 298 288 627    Basic Metabolic Panel: Recent Labs  Lab 07/17/21 0518 07/18/21 0449 07/19/21 0451 07/20/21 0623 07/21/21 0527 07/22/21 0829  NA 134* 133* 135 136 135 134*  K 4.0 4.1 3.8 3.7 4.0 3.4*  CL 101 102 102 102 102 99  CO2 22 22 22 24 26 25   GLUCOSE 117* 92 107* 117* 125* 128*  BUN 10 9 10 10 13 13   CREATININE 0.57 0.48 0.46 0.49 0.36* 0.37*  CALCIUM 9.6 9.3 9.0 9.4 9.5 9.4  MG 1.8  --   --   --   --   --    GFR: Estimated Creatinine Clearance: 48.3 mL/min (A) (by C-G formula based on SCr of 0.37 mg/dL (L)). Recent Labs  Lab 07/17/21 0350 07/18/21 0449 07/19/21 0451 07/20/21 0938 07/21/21 1829 07/22/21 0825 07/22/21 0829 07/22/21 0908  PROCALCITON  0.73 0.80 0.76  --   --   --   --  0.98  WBC  --  24.2* 27.1* 27.7* 27.0*  --  30.7*  --   LATICACIDVEN  --   --   --   --   --  1.6  --   --     Liver Function Tests: Recent Labs  Lab 07/16/21 0518 07/17/21 0518 07/22/21 0829  AST 29 36 65*  ALT 15 20 32  ALKPHOS 308* 396* 416*  BILITOT 0.2* 0.3 0.5  PROT 4.9* 6.0* 5.1*  ALBUMIN 1.6* 1.9* 1.7*   No results for input(s): LIPASE, AMYLASE in the last 168 hours. No results for input(s): AMMONIA in the last 168 hours.  ABG    Component Value Date/Time   PHART 7.45 07/22/2021 0911   PCO2ART 42 07/22/2021 0911   PO2ART 89 07/22/2021 0911   HCO3 29.2 (H) 07/22/2021 0911   TCO2 21 (L) 06/29/2021 1340    ACIDBASEDEF 6.0 (H) 06/29/2021 1340   O2SAT 98.5 07/22/2021 0911     Coagulation Profile: No results for input(s): INR, PROTIME in the last 168 hours.  Cardiac Enzymes: No results for input(s): CKTOTAL, CKMB, CKMBINDEX, TROPONINI in the last 168 hours.  HbA1C: Hgb A1c MFr Bld  Date/Time Value Ref Range Status  06/30/2021 03:20 AM 5.3 4.8 - 5.6 % Final    Comment:    (NOTE) Pre diabetes:          5.7%-6.4%  Diabetes:              >6.4%  Glycemic control for   <7.0% adults with diabetes   08/30/2020 10:04 AM 6.4 4.6 - 6.5 % Final    Comment:    Glycemic Control Guidelines for People with Diabetes:Non Diabetic:  <6%Goal of Therapy: <7%Additional Action Suggested:  >8%     CBG: Recent Labs  Lab 07/22/21 0733  GLUCAP 126*    Review of Systems:   Unable to obtain  Past Medical History:  She,  has a past medical history of Breast mass, left, Cancer ([place]), GI bleed (05/03/2021), Headache, Heart murmur, Hypertension, Mitral valve prolapse, and Tuberculosis.   Surgical History:   Past Surgical History:  Procedure Laterality Date   BIOPSY  05/04/2021   Procedure: BIOPSY;  Surgeon: Rush Landmark Telford Nab., MD;  Location: [place];  Service: Gastroenterology;;   BRONCHIAL BIOPSY  05/07/2021   Procedure: BRONCHIAL BIOPSIES;  Surgeon: Margaretha Seeds, MD;  Location[facility] ENDOSCOPY;  Service: Pulmonary;;   BRONCHIAL BRUSHINGS  05/07/2021   Procedure: BRONCHIAL BRUSHINGS;  Surgeon: Margaretha Seeds, MD;  Location: [facility] ENDOSCOPY;  Service: Pulmonary;;   BRONCHIAL NEEDLE ASPIRATION BIOPSY  05/07/2021   Procedure: BRONCHIAL NEEDLE ASPIRATION BIOPSIES;  Surgeon: Margaretha Seeds, MD;  Location: [facility] ENDOSCOPY;  Service: Pulmonary;;   BRONCHIAL WASHINGS  05/07/2021   Procedure: BRONCHIAL WASHINGS;  Surgeon: Margaretha Seeds, MD;  Location: [place];  Service: Pulmonary;;   COLONOSCOPY WITH PROPOFOL N/A 05/09/2021   Procedure: COLONOSCOPY WITH PROPOFOL;  Surgeon: Ladene Artist, MD;  Location: [facility] ENDOSCOPY;  Service: Endoscopy;  Laterality: N/A;   ESOPHAGOGASTRODUODENOSCOPY N/A 05/04/2021   Procedure: ESOPHAGOGASTRODUODENOSCOPY (EGD);  Surgeon: Irving Copas., MD;  Location: [place];  Service: Gastroenterology;  Laterality: N/A;   HEMOSTASIS CONTROL  05/07/2021   Procedure: HEMOSTASIS CONTROL;  Surgeon: Margaretha Seeds, MD;  Location: [place];  Service: Pulmonary;;   LAPAROSCOPIC PARTIAL RIGHT COLECTOMY N/A 06/29/2021   Procedure: LAPAROSCOPIC PARTIAL RIGHT COLECTOMY  WITH END ILEOSTOMY;  Surgeon: Leighton Ruff, MD;  Location: [facility];  Service: General;  Laterality: N/A;   MASS EXCISION Left 08/14/2017   Procedure: PARTIAL EXCISION  LEFT BREAST  MASS ERAS PATHWAY;  Surgeon: Fanny Skates, MD;  Location: [place];  Service: General;  Laterality: Left;   MASTECTOMY W/ SENTINEL NODE BIOPSY Left 09/14/2017   Procedure: LEFT TOTAL MASTECTOMY WITH SENTINEL LYMPH NODE BIOPSY;  Surgeon: Fanny Skates, MD;  Location: [place];  Service: General;  Laterality: Left;   NO PAST SURGERIES     POLYPECTOMY  05/09/2021   Procedure: POLYPECTOMY;  Surgeon: Ladene Artist, MD;  Location: [facility] ENDOSCOPY;  Service: Endoscopy;;   SUBMUCOSAL TATTOO INJECTION  05/09/2021   Procedure: SUBMUCOSAL TATTOO INJECTION;  Surgeon: Ladene Artist, MD;  Location: [place];  Service: Endoscopy;;   VIDEO BRONCHOSCOPY WITH ENDOBRONCHIAL ULTRASOUND N/A 05/07/2021   Procedure: VIDEO BRONCHOSCOPY WITH ENDOBRONCHIAL ULTRASOUND;  Surgeon: Margaretha Seeds, MD;  Location: [place];  Service: Pulmonary;  Laterality: N/A;     Social History:   reports that she has never smoked. She has never used smokeless tobacco. She reports current alcohol use. She reports that she does not use drugs.   Family History:  Her family history includes Breast cancer in her sister; Stroke in her maternal grandmother and mother.   Allergies Allergies  Allergen Reactions   Statins Nausea And  Vomiting and Other (See Comments)    MUSCLE PAIN   Amlodipine     fatigue   Clonidine Derivatives     headache   Losartan     abd pain   Sulfa Antibiotics Nausea Only   Sulfamethoxazole-Trimethoprim Nausea Only    " Severe Nausea "     Home Medications  Prior to Admission medications   Medication Sig Start Date End Date Taking? Authorizing Provider  acetaminophen (TYLENOL) 500 MG tablet Take 1 tablet (500 mg total) by mouth every 8 (eight) hours as needed for headache. 05/26/21  Yes Thurnell Lose, MD  atenolol (TENORMIN) 100 MG tablet Take 1 tablet (100 mg total) by mouth 2 (two) times daily. 06/05/21  Yes Plotnikov, Evie Lacks, MD  cholecalciferol (VITAMIN D) 1000 units tablet Take 1 tablet (1,000 Units total) by mouth daily. 08/01/16  Yes Plotnikov, Evie Lacks, MD  Multiple Vitamin (MULTIVITAMIN WITH MINERALS) TABS tablet Take 1 tablet by mouth daily. 05/27/21  Yes Thurnell Lose, MD  ondansetron (ZOFRAN) 4 MG tablet Take 1 tablet (4 mg total) by mouth every 8 (eight) hours as needed for nausea. 06/10/21  Yes Plotnikov, Evie Lacks, MD  pantoprazole (PROTONIX) 40 MG tablet Take 1 tablet (40 mg total) by mouth 2 (two) times daily before a meal. 05/26/21  Yes Thurnell Lose, MD  senna-docusate (SENOKOT-S) 8.6-50 MG tablet Take 1-2 tablets by mouth daily as needed for mild constipation. 06/10/21 06/10/22 Yes Plotnikov, Evie Lacks, MD  vitamin B-12 1000 MCG tablet Take 1 tablet (1,000 mcg total) by mouth daily. 05/27/21  Yes Thurnell Lose, MD  fentaNYL (DURAGESIC) 12 MCG/HR Place 1 patch onto the skin every 3 (three) days. Patient not taking: Reported on 06/29/2021 06/10/21   Plotnikov, Evie Lacks, MD     Signature:  Chesley Mires, MD Home Pager - [phone] 07/22/2021, 1:45 PM

## 2021-07-22 NOTE — Significant Event (Signed)
Rapid Response Event Note  ? ?Reason for Call : Altered Mental Status, Tachypnea  ?Notified by [place] charge in regards to patient having change in mental status as well as being more drowsy and lethargic. Also, bedside RN stated that patient was having increased respirations. Patient speaks [place]. Ipad translator at beside  ? ?Initial Focused Assessment:  ?Neuro: Drowsy, very weak, able to move extremities upon commands, oriented to self and place but disoriented to time and situation. Patient stated that she has no pain at this time.  ?Temp: 100.2 F rectal  ?Cardiac: ST 110s-120s, BP WNL see vitals, s1 and s2 heard upon auscultation, no adventitious heart sounds auscultated  ?Pulmonary: O2 Sats 99% on 2LNC, RR 30s-40s but not labored at this time.  ? ?Last pain medication given on 3/12 at 1101-oxyCodone 10mg   ? ?Interventions:  ?Placed ABG order per rapid response protocol ?-Notified Respiratory in regards to STAT ABG ?5 [place] paged MD Maren Beach to bedside ? ?Plan of Care:  ?Transfer patient to SD for closer monitoring  ? ? ?Event Summary:  ? ?MD Notified: MD Maren Beach notified by [place] charge, Jarrett Soho ?Call Time: Rapid Called at [phone]  ?Arrival Time: 6 ?End Time: 0815  ? ?Laurence Slate, RN ?

## 2021-07-22 NOTE — Progress Notes (Signed)
Pharmacy Antibiotic Note ? ?Robin Estrada is a 76 y.o. female admitted on 06/28/2021 with Persistent pleural effusions +/- intraabdominal source of infection.Marland Kitchen   Pharmacy has been consulted for Zosyn dosing. ? ?ID: Persistent pleural effusions +/- intraabdominal source of infection. ?- Tmax 100.2, WBC elevated, PCT WNL, no cultures ?- CXR 3/8 progression of opacity in the right lung base compatible with right pleural effusion and right lower lobe consolidation. ?- 3/13: rapid response for tachypnea tachycardia shallow breathing and lethargy. Resume Zosyn for possible intraabdominal source of infection. CXR shows similar large right pleural effusion with atelectasis/infiltrate.  ? ?Zosyn 2/18 - 2/23, 3/13>> ?3/6 Unasyn for asp pna >> 3/8 ?3/8 Meropenem (leukocytosis) >> 3/10 ? ?3/13: MRSA PCR: negative ?2/17: COVID/Flu: negative ? ?Plan: ?Zosyn 3.375g IV q8hr ? ? ? ?Height: 5' (152.4 cm) ?Weight: 59.5 kg (131 lb 2.8 oz) ?IBW/kg (Calculated) : 45.5 ? ?Temp (24hrs), Avg:98.8 ?F (37.1 ?C), Min:97.6 ?F (36.4 ?C), Max:100.2 ?F (37.9 ?C) ? ?Recent Labs  ?Lab 07/18/21 ?8756 07/19/21 ?0451 07/20/21 ?4332 07/21/21 ?9518 07/22/21 ?0825 07/22/21 ?8416  ?WBC 24.2* 27.1* 27.7* 27.0*  --  30.7*  ?CREATININE 0.48 0.46 0.49 0.36*  --  0.37*  ?LATICACIDVEN  --   --   --   --  1.6  --   ?  ?Estimated Creatinine Clearance: 48.3 mL/min (A) (by C-G formula based on SCr of 0.37 mg/dL (L)).   ? ?Allergies  ?Allergen Reactions  ? Statins Nausea And Vomiting and Other (See Comments)  ?  MUSCLE PAIN  ? Amlodipine   ?  fatigue  ? Clonidine Derivatives   ?  headache  ? Losartan   ?  abd pain  ? Sulfa Antibiotics Nausea Only  ? Sulfamethoxazole-Trimethoprim Nausea Only  ?  " Severe Nausea "  ? ? ? ?Larrie Lucia S. Alford Highland, PharmD, BCPS ?Clinical Staff Pharmacist ?[email or web address] ?Alford Highland, [employer] ?07/22/2021 11:23 AM ? ?

## 2021-07-22 NOTE — Progress Notes (Signed)
PT Cancellation Note ? ?Patient Details ?Name: Robin Estrada ?MRN: [id number] ?DOB: 1945-11-07 ? ? ?Cancelled Treatment:    Reason Eval/Treat Not Completed: Medical issues which prohibited therapy, to  ICU for   change in medical status.Tresa Endo PT ?Acute Rehabilitation Services ?Pager [phone] ?Office [phone] ? ? ? ?Dajanae Brophy, Shella Maxim ?07/22/2021, 11:20 AM ?

## 2021-07-22 NOTE — Consult Note (Signed)
[place] Nurse wound follow up ?Patient receiving care in [facility] ICU 1227. Transferred from 1520 this a.m. ?Wound type: DTPI to sacrum now with a small area of unstageable on the right buttock. ?Also, discovered a very small DTPI to mid-back along spine ?Measurement: ?Entire evolving sacral DTPI measures 5 cm x 8 cm. There is a portion that is now unstageable to the right buttock and it measures 1.8 cm x 1.8 cm. This area is light tan in color.  The remainder is purple and pink--some overlying tissue is missing. ? ?The mid-back DTPI is maroon and measures 0.5 cm x 0.2 cm. The overlying tissue is intact.  It is currently covered with a foam dressing. The foam dressing to the area should be continued. ?Wound bed: ?Drainage (amount, consistency, odor) none ?Periwound: intact ?Dressing procedure/placement/frequency: ?BID saline moistened gauze dressing with ABD pad to sacral wound.  ?I have added turning instructions and bilateral Prevalon heel lift boots. ? ?[place] Nurse ostomy follow up ?Stoma type/location: RUQ fecal stoma ?Stomal assessment/size: 1 inch, round, moist, red ?Peristomal assessment: intact ?Treatment options for stomal/peristomal skin: barrier ring ?Output: approximately 30 ml of thin brown effluent.  ?Ostomy pouching: 2pc. 2 and 3/4 inch system. Supplies at bedside ?Education provided: none. ?Enrolled patient in [place] Start Discharge program: No  ? ?Val Riles, RN, MSN, CWOCN, CNS-BC, pager [phone]  ?

## 2021-07-22 NOTE — Progress Notes (Signed)
?   07/22/21 0743  ?Assess: MEWS Score  ?Temp 97.8 ?F (36.6 ?C)  ?BP (!) 116/58  ?Pulse Rate (!) 115  ?Resp (!) 32  ?SpO2 96 %  ?O2 Device Nasal Cannula  ?O2 Flow Rate (L/min) 2 L/min  ?Assess: MEWS Score  ?MEWS Temp 0  ?MEWS Systolic 0  ?MEWS Pulse 2  ?MEWS RR 2  ?MEWS LOC 0  ?MEWS Score 4  ?MEWS Score Color Red  ?Assess: if the MEWS score is Yellow or Red  ?Were vital signs taken at a resting state? Yes  ?Focused Assessment Change from prior assessment (see assessment flowsheet)  ?Does the patient meet 2 or more of the SIRS criteria? Yes  ?Does the patient have a confirmed or suspected source of infection? Yes  ?Provider and Rapid Response Notified? Yes  ?MEWS guidelines implemented *See Row Information* Yes  ?Treat  ?MEWS Interventions Escalated (See documentation below)  ?Pain Scale 0-10  ?Pain Score 0  ?Take Vital Signs  ?Increase Vital Sign Frequency  Red: Q 1hr X 4 then Q 4hr X 4, if remains red, continue Q 4hrs  ?Escalate  ?MEWS: Escalate Red: discuss with charge nurse/RN and provider, consider discussing with RRT  ?Notify: Charge Nurse/RN  ?Name of Charge Nurse/RN Notified [employer], LPN (primary nurse) this nurse is CN  ?Date Charge Nurse/RN Notified 07/22/21  ?Time Charge Nurse/RN Notified [phone]  ?Notify: Provider  ?Provider Name/Title Dr. Lupita Leash  ?Date Provider Notified 07/22/21  ?Time Provider Notified [phone]  ?Notification Type Page  ?Notification Reason Change in status  ?Provider response At bedside  ?Date of Provider Response 07/22/21  ?Time of Provider Response 0800  ?Notify: Rapid Response  ?Name of Rapid Response RN Notified Polly Cobia, RN  ?Date Rapid Response Notified 07/22/21  ?Time Rapid Response Notified 0743  ?Document  ?Patient Outcome Transferred/level of care increased  ?Progress note created (see row info) Yes  ?Assess: SIRS CRITERIA  ?SIRS Temperature  0  ?SIRS Pulse 1  ?SIRS Respirations  1  ?SIRS WBC 0  ?SIRS Score Sum  2  ? ?Primary nurse, Melissa, LPN, made this charge nurse aware  of patient altered mental status that was new.  Patient assessed.  Respirations noted to be fast and shallow as noted above in vitals.  Actions taken as listed above.  MD gave order to transfer to step down.  Bed placement notified.  Patient transferred to room 1226. ? ?Virginia Rochester, RN ? ?

## 2021-07-22 NOTE — Progress Notes (Signed)
Palliative care brief note ? ?I checked in briefly with staff regarding [name] regarding her symptom management.  Her son is out of town on business currently. ? ?She was started on low-dose long-acting pain medication by Dr. Burr Medico and it appears her pain has been better controlled overall since starting this. ? ?Palliative will continue to follow peripherally. ? ?Micheline Rough, MD ?[place] Team ?[phone] ?  ?NO CHARGE NOTE ? ? ?

## 2021-07-22 NOTE — Progress Notes (Addendum)
Robin Estrada   DOB:1946/02/01   ZO#:[id number]   JWJ#:[id number] ? ?Oncology follow up  ? ?Subjective: Events from earlier today noted.  She developed altered mental status and tachypnea.  She was transferred to stepdown.  Seen by PCCM who recommends CT chest without contrast to further evaluate her right pleural effusion and lung mass.  CT pending.  No family currently at the bedside.  Patient does not want to be bothered.  Did not engage in conversation. ? ?Objective:  ?Vitals:  ? 07/22/21 1200 07/22/21 1422  ?BP:    ?Pulse:  (!) 110  ?Resp:  (!) 31  ?Temp: 98.6 ?F (37 ?C)   ?SpO2:  97%  ?  Body mass index is 25.23 kg/m?. ? ?Intake/Output Summary (Last 24 hours) at 07/22/2021 1519 ?Last data filed at 07/22/2021 1400 ?Gross per 24 hour  ?Intake 135.8 ml  ?Output 2050 ml  ?Net -1914.2 ml  ? ? ? Sclerae unicteric ?Abdomen soft, with some tenderness to palpation, ileostomy in place  ? Alert, resting ? ?CBG (last 3)  ?Recent Labs  ?  07/22/21 ?0733  ?GLUCAP 126*  ? ? ? ?Labs:  ?Urine Studies ?No results for input(s): UHGB, CRYS in the last 72 hours. ? ?Invalid input(s): UACOL, UAPR, USPG, UPH, UTP, UGL, UKET, UBIL, UNIT, UROB, ULEU, UEPI, UWBC, URBC, UBAC, CAST, UCOM, BILUA ? ?Basic Metabolic Panel: ?Recent Labs  ?Lab 07/17/21 ?0518 07/18/21 ?6213 07/19/21 ?0451 07/20/21 ?0865 07/21/21 ?7846 07/22/21 ?9629  ?NA 134* 133* 135 136 135 134*  ?K 4.0 4.1 3.8 3.7 4.0 3.4*  ?CL 101 102 102 102 102 99  ?CO2 22 22 22 24 26 25   ?GLUCOSE 117* 92 107* 117* 125* 128*  ?BUN 10 9 10 10 13 13   ?CREATININE 0.57 0.48 0.46 0.49 0.36* 0.37*  ?CALCIUM 9.6 9.3 9.0 9.4 9.5 9.4  ?MG 1.8  --   --   --   --   --   ? ?GFR ?Estimated Creatinine Clearance: 47.9 mL/min (A) (by C-G formula based on SCr of 0.37 mg/dL (L)). ?Liver Function Tests: ?Recent Labs  ?Lab 07/16/21 ?0518 07/17/21 ?5284 07/22/21 ?1324  ?AST 29 36 65*  ?ALT 15 20 32  ?ALKPHOS 308* 396* 416*  ?BILITOT 0.2* 0.3 0.5  ?PROT 4.9* 6.0* 5.1*  ?ALBUMIN 1.6* 1.9* 1.7*  ? ?No results for  input(s): LIPASE, AMYLASE in the last 168 hours. ? ?No results for input(s): AMMONIA in the last 168 hours. ?Coagulation profile ?No results for input(s): INR, PROTIME in the last 168 hours. ? ? ? ?CBC: ?Recent Labs  ?Lab 07/17/21 ?0518 07/18/21 ?4010 07/19/21 ?0451 07/20/21 ?2725 07/21/21 ?3664 07/22/21 ?4034  ?WBC 25.6* 24.2* 27.1* 27.7* 27.0* 30.7*  ?NEUTROABS 22.1* 20.7* 23.8* 24.2* 23.6*  --   ?HGB 9.3* 8.7* 8.8* 9.0* 8.8* 9.2*  ?HCT 32.7* 29.4* 29.3* 30.0* 29.3* 30.7*  ?MCV 98.2 96.4 95.4 94.0 94.8 95.0  ?PLT 326 198 320 298 288 258  ? ?Cardiac Enzymes: ?No results for input(s): CKTOTAL, CKMB, CKMBINDEX, TROPONINI in the last 168 hours. ?BNP: ?Invalid input(s): POCBNP ?CBG: ?Recent Labs  ?Lab 07/22/21 ?0733  ?GLUCAP 126*  ? ?D-Dimer ?No results for input(s): DDIMER in the last 72 hours. ?Hgb A1c ?No results for input(s): HGBA1C in the last 72 hours. ? ?Lipid Profile ?No results for input(s): CHOL, HDL, LDLCALC, TRIG, CHOLHDL, LDLDIRECT in the last 72 hours. ? ?Thyroid function studies ?No results for input(s): TSH, T4TOTAL, T3FREE, THYROIDAB in the last 72 hours. ? ?Invalid input(s): FREET3 ?Anemia work  up ?No results for input(s): VITAMINB12, FOLATE, FERRITIN, TIBC, IRON, RETICCTPCT in the last 72 hours. ? ?Microbiology ?Recent Results (from the past 240 hour(s))  ?MRSA Next Gen by PCR, Nasal     Status: None  ? Collection Time: 07/22/21  8:31 AM  ? Specimen: Nasal Mucosa; Nasal Swab  ?Result Value Ref Range Status  ? MRSA by PCR Next Gen NOT DETECTED NOT DETECTED Final  ?  Comment: (NOTE) ?The GeneXpert MRSA Assay (FDA approved for NASAL specimens only), ?is one component of a comprehensive MRSA colonization surveillance ?program. It is not intended to diagnose MRSA infection nor to guide ?or monitor treatment for MRSA infections. ?Test performance is not FDA approved in patients less than 2 years ?old. ?Performed at [facility], [address] ?[place], [place] [address] ?   ? ? ? ? ? ?Studies:  ?DG Abd 1 View ? ?Result Date: 07/22/2021 ?CLINICAL DATA:  Abdominal pain. EXAM: ABDOMEN - 1 VIEW COMPARISON:  CT abdomen pelvis dated July 09, 2021. FINDINGS: Paucity of bowel gas. Air and stool in the rectum. No radio-opaque calculi or other significant radiographic abnormality are seen. Scattered lytic lesions in the bony pelvis again noted. Chronic T11 compression deformity again noted. IMPRESSION: 1. Paucity of bowel gas. No definite acute abnormality. Electronically Signed   By: Titus Dubin M.D.   On: 07/22/2021 09:35  ? ?DG Chest Port 1 View ? ?Result Date: 07/22/2021 ?CLINICAL DATA:  Shortness of breath EXAM: PORTABLE CHEST 1 VIEW COMPARISON:  Chest x-ray 07/17/2021 FINDINGS: Cardiomediastinal silhouette appears grossly unchanged. Persistent large right pleural effusion with associated atelectasis/infiltrate. No new consolidation identified. No pneumothorax. IMPRESSION: No significant change since previous study. Electronically Signed   By: Ofilia Neas M.D.   On: 07/22/2021 08:57   ? ?Assessment: 76 y.o. female  ? 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  Left lower extremity DVT, on eliquis/heparin  ?11. Leukocytosis, likely reactive, possible related to her metastatic cancer ?12.  Right pleural effusion ? ?Plan:  ?-Pain appears to be adequately controlled at this time.  Palliative care following.  Continue current pain medication. ?-We have had multiple conversations in the past with the patient and her son.  Have discussed her overall guarded prognosis.  We have recommended hospice.  The patient's son has inquired about rehab, but doubtful she  is a candidate at this point.  We would recommend residential hospice. ?-Follow-up CT chest.  Reasonable to consider thoracentesis if symptomatic from pleural effusion. ?-Kyphoplasty was ordered but denied by her insurance.  Son was requesting for IR to appeal denial of kyphoplasty and would consider paying out-of-pocket if insurance still will not cover. ? ? ?Mikey Bussing, NP ?07/22/2021   ? ? ?Addendum ? ?I have seen the patient, examined her. I agree with the assessment and and plan and have edited the notes.  ? ?Pt was moved to ICU due to AMS. She was lethargic when I saw her in later afternoon, she did open her eyes, had a few words when I asked her questions, denies pain.  However she was not really engaged in the conversation, and close her eyes quickly after I woke her up. Lab reviewed. Her VS are  stable. I am not sure if this is related to the Oxycontin I placed last Thursday, or metabolic encephalopathy.  I spoke with Dr. Myriam Jacobson today, I agree with restarting antibiotics, possible right thoracentesis, although she is neither hypoxic or dyspneic.  I am not surprised that her condition is deteriorating continuously, and I think she probably be a candidate for residential hospice soon.  Her son is currently out of town, will be back in 2 days. I appreciate Dr. Lupita Leash and Dr. Kirstie Mirza care. I will f/u on Wednesday when her son is back. I will try to call her son tomorrow.  ? ?Truitt Merle MD  ?07/22/2021  ? ? ? ?

## 2021-07-23 DIAGNOSIS — Z515 Encounter for palliative care: Secondary | ICD-10-CM | POA: Diagnosis not present

## 2021-07-23 DIAGNOSIS — Z7189 Other specified counseling: Secondary | ICD-10-CM | POA: Diagnosis not present

## 2021-07-23 DIAGNOSIS — J910 Malignant pleural effusion: Secondary | ICD-10-CM | POA: Diagnosis not present

## 2021-07-23 LAB — PROCALCITONIN: Procalcitonin: 1.36 ng/mL

## 2021-07-23 LAB — CBC
HCT: 33.1 % — ABNORMAL LOW (ref 36.0–46.0)
Hemoglobin: 10.3 g/dL — ABNORMAL LOW (ref 12.0–15.0)
MCH: 28.8 pg (ref 26.0–34.0)
MCHC: 31.1 g/dL (ref 30.0–36.0)
MCV: 92.5 fL (ref 80.0–100.0)
Platelets: 247 10*3/uL (ref 150–400)
RBC: 3.58 MIL/uL — ABNORMAL LOW (ref 3.87–5.11)
RDW: 21.1 % — ABNORMAL HIGH (ref 11.5–15.5)
WBC: 28.8 10*3/uL — ABNORMAL HIGH (ref 4.0–10.5)
nRBC: 0 % (ref 0.0–0.2)

## 2021-07-23 LAB — COMPREHENSIVE METABOLIC PANEL
ALT: 32 U/L (ref 0–44)
AST: 74 U/L — ABNORMAL HIGH (ref 15–41)
Albumin: <1.5 g/dL — ABNORMAL LOW (ref 3.5–5.0)
Alkaline Phosphatase: 418 U/L — ABNORMAL HIGH (ref 38–126)
Anion gap: 10 (ref 5–15)
BUN: 13 mg/dL (ref 8–23)
CO2: 26 mmol/L (ref 22–32)
Calcium: 9.4 mg/dL (ref 8.9–10.3)
Chloride: 98 mmol/L (ref 98–111)
Creatinine, Ser: 0.56 mg/dL (ref 0.44–1.00)
GFR, Estimated: >60 mL/min (ref >60)
Glucose, Bld: 116 mg/dL — ABNORMAL HIGH (ref 70–99)
Potassium: 3.7 mmol/L (ref 3.5–5.1)
Sodium: 134 mmol/L — ABNORMAL LOW (ref 135–145)
Total Bilirubin: 0.6 mg/dL (ref 0.3–1.2)
Total Protein: 4.9 g/dL — ABNORMAL LOW (ref 6.5–8.1)

## 2021-07-23 MED ORDER — MORPHINE SULFATE (PF) 2 MG/ML IV SOLN
1.0000 mg | Freq: Once | INTRAVENOUS | Status: AC
  Administered 2021-07-23: 1 mg via INTRAVENOUS
  Filled 2021-07-23: qty 1

## 2021-07-23 MED ORDER — HYDROMORPHONE HCL 1 MG/ML IJ SOLN
0.5000 mg | Freq: Once | INTRAMUSCULAR | Status: AC | PRN
  Administered 2021-07-23: 0.5 mg via INTRAVENOUS
  Filled 2021-07-23: qty 1

## 2021-07-23 NOTE — Consult Note (Addendum)
? ?  NAMEReniya Estrada, MRN:  [id number], DOB:  24-Oct-1945, LOS: 103 ?ADMISSION DATE:  06/28/2021, CONSULTATION DATE:  07/22/2021 ?REFERRING MD:  Dr. Lupita Leash, [facility], CHIEF COMPLAINT:  Pleural effusion  ? 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  Found to have intussusception of cecum and distal ileum.  Had laparoscopic Rt colectomy and end ileostomy.  Had post op cardiac arrest from inferior STEMI.  Had some clinical improvement.  Developed tachypnea and tachycardia on 3/13 with lethargy.  Transferred to SDU.  CXR from 07/14/21 showed opacity of Rt lung base with ATX and effusion which persistent on CXR from 07/22/21.  PCCM asked to assess for thoracentesis. ? ?Pertinent  Medical History  ?GI bleeding, Headache, HTN, MVP, TB as a child, Lung cancer, Breast cancer ? ?Interim History / Subjective:  ?Translator at bedside (apparently the translator services do not offer specific dialect the patient speaks) ?Afebrile  ?Pt denies pain, SOB  ? ?Objective   ?Blood pressure (!) 116/45, pulse (!) 108, temperature 98.5 ?F (36.9 ?C), temperature source Oral, resp. rate 20, height 5' (1.524 m), weight 57.8 kg, SpO2 98 %. ?   ?FiO2 (%):  [28 %] 28 %  ? ?Intake/Output Summary (Last 24 hours) at 07/23/2021 0739 ?Last data filed at 07/23/2021 0600 ?Gross per 24 hour  ?Intake 151.13 ml  ?Output 695 ml  ?Net -543.87 ml  ? ?Filed Weights  ? 07/22/21 0500 07/22/21 0832 07/23/21 0400  ?Weight: 59.5 kg 58.6 kg 57.8 kg  ? ? ?Examination: ?General: elderly adult female lying in bed in NAD ?HEENT: MM pink/moist, anicteric, wearing glasses, pupils regular ?Neuro: Awake, alert, speech clear with translator, generalized weakness / MAE ?CV: s1s2 RRR, no m/r/g ?PULM: tachypnea but not labored at rest, diminished breath sounds on right with ?pleural rub, clear on left ?GI: soft, bsx4 active  ?Extremities: warm/dry, generalized 1+ edema  ?Skin: no  rashes or lesions ? ? ?      ?R pleural space assessment 3/14.  Largely free flowing pleural fluid with some areas of septation.  ? ?Assessment & Plan:  ? ?Rt pleural effusion and atelectasis with Rt lung mass. ?Repeat CT chest with increased right mass-like consolidation in RLL, large RUL mass appears more pronounced, intralobular septal thickening worrisome for lymphangitic spread, known metastatic disease.  ?-offered thoracentesis to patient via translator, she indicates it would "just be more pain", she would like to think about it  ?-empiric zosyn per [facility] ?-no significant work of breathing, thora would only be for short term symptom relief / palliation  ? ?Metastatic breast cancer. ?Diffuse metastatic lesions in thoracic and lumbar spine, and epidural spread of tumor at L4 ?-supportive care ?-palliative care following, family planning to meet on 3/16 to discuss goals of care further  ?-PRN pain control  ? ?Intussusception s/p colectomy and end ileostomy. ?-per [facility]  ? ?Lt leg DVT. ?-continue eliquis, if pt decides she wants to pursue thoracentesis, would need to hold ? ? ?Signature:  ? ?PCCM will follow up 3/16.  Please call sooner if new needs arise.  ? ? ?Noe Gens, MSN, APRN, NP-C, AGACNP-BC ?[facility] ?07/23/2021, 7:39 AM ? ? ?Please see [email or web address] for pager details.  ? ?From 7A-7P if no response, please call [phone] ?After hours, please call Warren Lacy [phone] ? ? ? ? ? ? ?

## 2021-07-23 NOTE — Progress Notes (Signed)
Chaplain engaged in an initial visit with Robin Estrada and her friend at the bedside.  Robin Estrada was resting and Chaplain checked in on her friend.  Friend voiced that Robin Estrada's son would be arriving on Thursday.  Chaplain offered support and presence and will follow-up. ? ? ? 07/23/21 1600  ?Clinical Encounter Type  ?Visited With Patient;Patient not available;Family  ?Visit Type Initial  ? ? ?

## 2021-07-23 NOTE — Progress Notes (Signed)
Bad weather ?PROGRESS NOTE ?Robin Estrada  VCB:[id number] DOB: 28-Nov-1945 DOA: 06/28/2021 ?PCP: Plotnikov, Evie Lacks, MD  ? 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  ?CT abdomen with IV contrast showed large intussusception of the cecum and distal ileum within the length of the ascending colon and subsequent dilatation of the ascending colon. General surgery was consulted.  She underwent laparoscopic right colectomy with end ileostomy on 06/29/2021.She had a post-op cardiac arrest and inferior STEMI-- Cardiology consulted, Not a candidate for cardiac intervention.overall tolerating diet having colostomy output. kyphoplasty considered but was not approved by her insurance. Patient remains deconditioned weak with overall  w/ poor prognosis with ongoing poor oral intake failure to thrive ,is withdrawn deconditioned.  3/13 moved to stepdown due to tachypnea lethargy tachycardia shallow breathing.  Pulmonary consulted-has right-sided pleural effusion chest x-ray CT chest was ordered.   ?   ?Subjective: ?Seen and examined this morning.  Translator at bedside. ?Appears more alert awake but not engaging in communication, wanting to rest. ?Intervention advised-patient saying she is tired and wants to rest. ?Overnight heart rate in the low 100 BP stable saturating well on 2 L nasal cannula intermittently tachypneic no fever. Wbc down to 28.8k ?  ?Assessment and Plan: ? ?Large cecal and distal ileum intussusception with bowel obstruction: ?Presented with severe abdominal pain with nausea and found to have intussusception s/p laparoscopic right colectomy with end ileostomy 2/1, last CT abdomen 2/28-showed small areas of intussusception, and does have metastatic disease-she is poor repeat surgical surgical candidate.   Having colostomy output, x-ray abdomen 3/13-stool in the rectum paucity of bowel gas no definitive acute finding.per [facility] less likely intra-abdominal source of leucocytosis.  Encourage oral intake ? 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  ?Pathologic fracture deformity at the level of T11 ?Cancer related pain:  ?s/p total mastectomy in 2019,  s/p CT-guided lung biopsy on 05/14/2021- results consistent of poorly differentiated malignancy with sarcomatoid and epithelioid features.  Was placed on OxyContin 3/11-but since she has been lethargic discontinued 3/12 LAM.  Placed on Toradol low-dose for pain control continue p.o. oxy as needed, reserve Dilaudid for severe pain.  CT chest 3/13 showed progressive metastatic disease see report below for details- ?"1.Enlarging masslike consolidation within the right upper lobe. Exact measurement of the underlying mass is difficult given the lack of intravenous contrast and dense consolidation of the adjacent right middle and right lower lobes. Findings are consistent with progression of disease. ?2. Large multilocular right pleural effusion, which has increased ?since prior exam. ?3. Progressive intrathoracic metastatic disease, with new and enlarging bilateral pleural and parenchymal lung nodules/masses,diffuse bony metastases, enlarging left chest wall mass, and lymphadenopathy as above". ? ? ?Acute hypoxic aspiratory failure ?Large multiloculated right pleural effusion-likely atelectasis ?Emphysema of the lung ?Enlarging mass to consolidation within the right upper lobe: ?Pulse ox stable on 2 L nasal cannula, CT chest report as above PCCM was consulted for possible thoracentesis,await  their inputs ?previously on Unasyn and then meropenem and stopped on 3/10.  Acute tachypnea persistent leukocytosis placed back on Zosyn 3/13. ? ?Acute blood loss anemia ?Anemia of chronic disease: ?S/p 1 u  PRBC on 3/3 and IV iron on 3/4.Stable. ?Recent Labs  ?Lab 07/19/21 ?0451 07/20/21 ?1856 07/21/21 ?3149 07/22/21 ?7026 07/23/21 ?0241  ?HGB 8.8* 9.0* 8.8* 9.2* 10.3*  ?HCT 29.3* 30.0* 29.3* 30.7* 33.1*  ?  ?Inferior STEMI / Postop cardiac arrest  ?Ischemic cardiomyopathy: ?Troponin peaked to 24,000,TTE  w/LV ejection fraction of 50%, the left ventricle demonstrated RWMA.  S/p heparin gtt>now on Eliquis, coreg low dose,.refusing statin,zetia, imdur.Cardiology feels that she is a poor cath candidate and have signed off.    ? ?Acute DVT of the left posterior tibial vein on 2/23: Continue Eliquis.  ?History of GI bleed -stable. Cont PPI ?Essential hypertension: stable on low-dose Coreg. ? ?Cystic mass of pancreas ?Finding of hepatic cirrhosis on previous CT imaging: ?previous CA 19-9 level normal follow-up outpatient. ? ?PTA sacral decubitus:turn frequently. Wound care consulted. ? ?Failure to thrive ?Goals of care ?Poor prognosis:  ?Full code currently.Extensive discussion by oncology, last meeting 3/10 and also seen by palliative care team.Patient/son is not accepting of hospice philosophy despite detailed discussions by her oncologist. ?Dr. Burr Medico is following.Patient moved to stepdown unit 3/13-updated patient's sons that overall prognosis remains guarded/poor. Patient is likely a candidate for residential hospice.Son is leaning towards DNR that but he would like to talk to his sister before making decision.  Dr. Rowe Pavy notified, at this time we will pursue further palliative discussion with son, son returning Thursday. ? ?DVT prophylaxis: Place and maintain sequential compression device Start: 07/03/21 0815 Eliquis. ?Code Status:   Code Status: Full Code ?Family Communication:Plan of care discussed with patient.   Discussed with son multiple times at bedside and also over the phone last few days ? ?Disposition:Currently not medically stable for discharge. ?Status is: Inpatient ?Remains inpatient appropriate because: Ongoing management of pain and multiple issues. ? ?Objective: ?Vitals last 24 hrs: ?Vitals:  ? 07/23/21 0600 07/23/21 0800 07/23/21 0801 07/23/21 0900  ?BP: (!) 116/45 (!) 122/46 (!) 122/46 (!) 109/55  ?Pulse: (!) 108 (!) 112 (!) 126 (!) 110  ?Resp: 20 (!) 28  (!) 21  ?Temp:   98.1 ?F (36.7 ?C)   ?TempSrc:   Axillary   ?SpO2: 98% 98%  98%  ?Weight:      ?Height:      ? ?Weight change: -0.9 kg ? ?Physical Examination: ?General exam: Alert awake, not communicative not much interactive, appears weak debilitated, older than stated age, weak appearing. ?HEENT:Oral mucosa moist,Ear/Nose WNL grossly, dentition normal. ?Respiratory system: Decreased breath sounds on right base.No use of accessory muscle ?Cardiovascular system: S1 & S2 +, No JVD,. ?Gastrointestinal system: Abdomen soft,NT,ND,BS+ ?Nervous System:Alert, awake, moving extremities and grossly nonfocal ?Extremities: LUE edema  + but better form before ?Skin: No rashes,no icterus. ?MSK: thin muscle bulk,tone, power  ? ?Medications reviewed:  ?Scheduled Meds: ? (feeding supplement) PROSource Plus  30 mL Oral TID BM  ? apixaban  5 mg Oral BID  ? carvedilol  3.125 mg Oral BID WC  ? Chlorhexidine Gluconate Cloth  6 each Topical Daily  ? DULoxetine  20 mg Oral Daily  ? feeding supplement  1 Container Oral BID BM  ? lidocaine  1 patch Transdermal Q24H  ? mouth rinse  15 mL Mouth Rinse BID  ? methocarbamol  500 mg Oral TID  ? mirtazapine  15 mg Oral QHS  ? multivitamin with minerals  1 tablet Oral Daily  ? nutrition supplement (JUVEN)  1 packet Oral BID BM  ? pantoprazole  40 mg Oral QHS  ? ?Continuous Infusions: ?  piperacillin-tazobactam (ZOSYN)  IV Stopped (07/23/21 0815)  ? ?Pressure Injury 07/22/21 Vertebral column Mid Deep Tissue Pressure Injury - Purple or maroon  localized area of discolored intact skin or blood-filled blister due to damage of underlying soft tissue from pressure and/or shear. (Active)  ?07/22/21 0830  ?Location: Vertebral column  ?Location Orientation:
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
## 2021-07-24 ENCOUNTER — Ambulatory Visit: Payer: Medicare HMO | Admitting: Gastroenterology

## 2021-07-24 DIAGNOSIS — K561 Intussusception: Secondary | ICD-10-CM | POA: Diagnosis not present

## 2021-07-24 LAB — COMPREHENSIVE METABOLIC PANEL
ALT: 35 U/L (ref 0–44)
AST: 83 U/L — ABNORMAL HIGH (ref 15–41)
Albumin: 1.7 g/dL — ABNORMAL LOW (ref 3.5–5.0)
Alkaline Phosphatase: 396 U/L — ABNORMAL HIGH (ref 38–126)
Anion gap: 11 (ref 5–15)
BUN: 16 mg/dL (ref 8–23)
CO2: 24 mmol/L (ref 22–32)
Calcium: 9.4 mg/dL (ref 8.9–10.3)
Chloride: 101 mmol/L (ref 98–111)
Creatinine, Ser: 0.51 mg/dL (ref 0.44–1.00)
GFR, Estimated: >60 mL/min (ref >60)
Glucose, Bld: 99 mg/dL (ref 70–99)
Potassium: 3.9 mmol/L (ref 3.5–5.1)
Sodium: 136 mmol/L (ref 135–145)
Total Bilirubin: 1 mg/dL (ref 0.3–1.2)
Total Protein: 5.1 g/dL — ABNORMAL LOW (ref 6.5–8.1)

## 2021-07-24 LAB — PROCALCITONIN: Procalcitonin: 1.55 ng/mL

## 2021-07-24 LAB — CBC
HCT: 32.1 % — ABNORMAL LOW (ref 36.0–46.0)
Hemoglobin: 9.7 g/dL — ABNORMAL LOW (ref 12.0–15.0)
MCH: 29.2 pg (ref 26.0–34.0)
MCHC: 30.2 g/dL (ref 30.0–36.0)
MCV: 96.7 fL (ref 80.0–100.0)
Platelets: 254 10*3/uL (ref 150–400)
RBC: 3.32 MIL/uL — ABNORMAL LOW (ref 3.87–5.11)
RDW: 21 % — ABNORMAL HIGH (ref 11.5–15.5)
WBC: 31.7 10*3/uL — ABNORMAL HIGH (ref 4.0–10.5)
nRBC: 0 % (ref 0.0–0.2)

## 2021-07-24 MED ORDER — KETOROLAC TROMETHAMINE 15 MG/ML IJ SOLN
15.0000 mg | Freq: Four times a day (QID) | INTRAMUSCULAR | Status: DC | PRN
  Administered 2021-07-24 – 2021-07-25 (×3): 15 mg via INTRAVENOUS
  Filled 2021-07-24 (×3): qty 1

## 2021-07-24 MED ORDER — METHOCARBAMOL 1000 MG/10ML IJ SOLN
500.0000 mg | Freq: Three times a day (TID) | INTRAVENOUS | Status: DC | PRN
  Administered 2021-07-24: 500 mg via INTRAVENOUS
  Filled 2021-07-24: qty 5
  Filled 2021-07-24: qty 500

## 2021-07-24 MED ORDER — BOOST / RESOURCE BREEZE PO LIQD CUSTOM
1.0000 | Freq: Three times a day (TID) | ORAL | Status: DC
  Administered 2021-07-24 – 2021-07-25 (×3): 1 via ORAL

## 2021-07-24 MED ORDER — ENOXAPARIN SODIUM 60 MG/0.6ML IJ SOSY
60.0000 mg | PREFILLED_SYRINGE | Freq: Two times a day (BID) | INTRAMUSCULAR | Status: DC
  Administered 2021-07-24: 60 mg via SUBCUTANEOUS
  Filled 2021-07-24 (×4): qty 0.6

## 2021-07-24 NOTE — Progress Notes (Signed)
? ? ?  OVERNIGHT PROGRESS REPORT ? ?Notified by RN for pt crying out in pain and having multiple variable ectopies. ?Central Tele had notified RN of tachy rates with various ectopies. ? ?Dilaudid ordered once as it was noted earlier that she became lethargic with multiple administrations.   ? ? ?Update 0300 hrs ?Currently after single dose administration:patient will awake and acknowledge RN. ? Ectopy is generally resolved and: ? HR 117 ? RR  19 ? BP  125/44 ? SPO2 100 ?  ?Gershon Cull MSNA MSN ACNPC-AG ?Acute Care Nurse Practitioner ?[facility] ?[place] ? ? ? ?

## 2021-07-24 NOTE — Progress Notes (Signed)
Physical Therapy Discharge ?Patient Details ?Name: Robin Estrada ?MRN: [id number] ?DOB: 12-22-1945 ?Today's Date: 07/24/2021 ?Time:  -  ?  ? ?Patient discharged from PT services secondary to medical decline - will need to re-order PT to resume therapy services. Refer to Palliative medicine note .  ? ?Please see latest therapy progress note for current level of functioning and progress toward goals.   ? ?Progress and discharge plan discussed with patient and/or caregiver: Patient unable to participate in discharge planning and no caregivers available ? ?GP ?   ?Tresa Endo PT ?Acute Rehabilitation Services ?Pager [phone] ?Office [phone] ? ?Porter Moes, Shella Maxim ?07/24/2021, 7:22 AM  ?

## 2021-07-24 NOTE — Progress Notes (Signed)
ANTICOAGULATION CONSULT NOTE ? ?Pharmacy Consult for Lovenox ?Indication: DVT ? ?Allergies  ?Allergen Reactions  ? Statins Nausea And Vomiting and Other (See Comments)  ?  MUSCLE PAIN  ? Amlodipine   ?  fatigue  ? Clonidine Derivatives   ?  headache  ? Losartan   ?  abd pain  ? Sulfa Antibiotics Nausea Only  ? Sulfamethoxazole-Trimethoprim Nausea Only  ?  " Severe Nausea "  ? ? ?Patient Measurements: ?Height: 5' (152.4 cm) ?Weight: 59.3 kg (130 lb 11.7 oz) ?IBW/kg (Calculated) : 45.5 ? ?Vital Signs: ?Temp: 97.6 ?F (36.4 ?C) (03/15 0750) ?Temp Source: Oral (03/15 0750) ?BP: 109/47 (03/15 0700) ?Pulse Rate: 109 (03/15 0700) ? ?Labs: ?Recent Labs  ?  07/22/21 ?0829 07/23/21 ?0241 07/24/21 ?2878  ?HGB 9.2* 10.3* 9.7*  ?HCT 30.7* 33.1* 32.1*  ?PLT 258 247 254  ?CREATININE 0.37* 0.56 0.51  ? ? ?Estimated Creatinine Clearance: 48.2 mL/min (by C-G formula based on SCr of 0.51 mg/dL). ? ? ?Medical History: ?Past Medical History:  ?Diagnosis Date  ? Breast mass, left   ? Large  ? Cancer [facility])   ? FIBROMYOMA  AGE 56-   BENIGN  RESOLVED  ? GI bleed 05/03/2021  ? 1/23 continue with pantoprazole  ? Headache   ? Heart murmur   ? Hypertension   ? Mitral valve prolapse   ? Tuberculosis   ? AS CHILD  W/ TX  ? ? ?Medications:  ?Currently on apixaban 5 mg po BID but patient refusing po ? ?Assessment: ?76 yo female currently on apixaban for acute DVT of the left posterior tibial vein on 2/23.  Today, patient refusing po and Pharmacy has been consulted to transition from apixaban to treatment dose Lovenox.  ? ?Goal of Therapy:  ?DVT treatment ?Monitor platelets by anticoagulation protocol: Yes ?  ?Plan:  ?Lovenox 60 mg (approx 1 mg/kg) subq every 12 hours ?Monitor CBC as ordered by provider, renal function, signs/symptoms of bleeding ? ? ?Royetta Asal, PharmD, BCPS ?Clinical Pharmacist ?[place] ?Please utilize Amion for appropriate phone number to reach the unit pharmacist ([place]) ?07/24/2021 8:14 AM ? ? ? ?

## 2021-07-24 NOTE — Progress Notes (Signed)
`Bad weather ?PROGRESS NOTE ?Robin Estrada  NLZ:[id number] DOB: [birth year] DOA: 06/28/2021 ?PCP: Plotnikov, Evie Lacks, MD  ? 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  ?CT abdomen with IV contrast showed large intussusception of the cecum and distal ileum within the length of the ascending colon and subsequent dilatation of the ascending colon. General surgery was consulted.  She underwent laparoscopic right colectomy with end ileostomy on 06/29/2021.She had a post-op cardiac arrest and inferior STEMI-- Cardiology consulted, Not a candidate for cardiac intervention.overall tolerating diet having colostomy output. kyphoplasty considered but was not approved by her insurance. Patient remains deconditioned weak with overall  w/ poor prognosis with ongoing poor oral intake failure to thrive ,is withdrawn deconditioned.  3/13 moved to stepdown due to tachypnea lethargy tachycardia shallow breathing.  Pulmonary consulted-has right-sided pleural effusion chest x-ray CT chest was ordered-enlarging masslike consolidation in the right upper lobe, large multiloculated right pleural effusion, progressive intrathoracic metastatic disease ?Enlarging bilateral pleural and parenchymal lung nodules/masses, diffuse brain metastases, enlarging left chest wall mass and lymphadenopathy.  Pulmonary deferred thoracentesis at this time.  Palliative care following and after discussion with son transitioned to DNR. ?   ?Subjective: ?Seen and examined this morning appears alert awake denies pain. ?Overnight got one-time dose of Dilaudid for severe pain. ?Remains afebrile blood pressure 90s to 100 ?WBC fluctuating at 31k today. ? ?Assessment and Plan: ? ?Large cecal and distal ileum intussusception with bowel obstruction: ?Presented with  severe abdominal pain with nausea and found to have intussusception s/p laparoscopic right colectomy with end ileostomy 2/1, last CT abdomen 2/28-showed small areas of intussusception, and does have metastatic disease-she is poor repeat surgical surgical candidate.  Having colostomy output, x-ray abdomen 3/13-stool in the rectum paucity of bowel gas no definitive acute finding.per CCS less likely intra-abdominal source of leucocytosis.  Encourage oral intake as tolerated. ? 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  ?Pathologic fracture deformity at the level of T11 ?Cancer related pain:  ?s/p total mastectomy in 2019,  s/p CT-guided lung biopsy on 05/14/2021- results consistent of poorly differentiated malignancy with sarcomatoid and epithelioid features. ?Continue pain control-OxyContin discontinued as it made her lethargic.  Dilaudid was discontinued 3/14 as she became more lethargic with it-but overnight patient was "screaming in pain" and with dilaudid had good response-we will have palliative care adjust the medication-I will increase Toradol dose for now.CT chest 3/13 showed progressive metastatic disease see report below for details: ? ?"1.Enlarging masslike consolidation within the right upper lobe. Exact measurement of the underlying mass is difficult given the lack of intravenous contrast and dense consolidation of the adjacent right middle and right lower lobes. Findings are consistent with progression of disease. ?2. Large multilocular right pleural effusion, which has increased ?since prior exam. ?3. Progressive intrathoracic metastatic disease, with new and enlarging bilateral pleural and parenchymal lung  nodules/masses,diffuse bony metastases, enlarging left chest wall mass, and lymphadenopathy as above". ? ?Acute hypoxic aspiratory failure ?Large multiloculated right pleural effusion-likely atelectasis ?Emphysema of the lung ?Enlarging mass to consolidation within the right upper lobe: ?Remains on 2 L nasal cannula no significant shortness of breath, chest x-ray/CT chest  with progressive metastatic disease multiloculated pleural effusion.  PCCM saw the patient deferred thoracentesis at this time. previously on Unasyn and then meropenem and stopped on 3/10.  Cont on Zosyn 3/13 in case of superimposed infection. ? ?Acute blood loss anemia ?Anemia of chronic disease: ?S/p 1 u PRBC on 3/3 and IV iron on 3/4.Stable. ?Recent Labs  ?Lab 07/20/21 ?0623 07/21/21 ?1610 07/22/21 ?9604 07/23/21 ?0241 07/24/21 ?5409  ?HGB 9.0* 8.8* 9.2* 10.3* 9.7*  ?HCT 30.0* 29.3* 30.7* 33.1* 32.1*  ?  ?Inferior STEMI / Postop cardiac arrest  ?Ischemic cardiomyopathy: ?Troponin peaked to 24,000,TTE  w/LV ejection fraction of 50%, the left ventricle demonstrated RWMA.  S/p heparin gtt>now on Eliquis, coreg low dose,.refusing statin,zetia, imdur.Cardiology feels that she is a poor cath candidate and have signed off.    ? ?Acute DVT of the left posterior tibial vein on 2/23: Continue Eliquis-since she is refusing p.o. we will switch to Lovenox therapeutic dose.  ?History of GI bleed -stable. Cont PPI ?Essential hypertension: BP stable. On low dose coreg- not taking po ? ?Cystic mass of pancreas ?Finding of hepatic cirrhosis on previous CT imaging: ?previous CA 19-9 level normal follow-up outpatient. ? ?PTA sacral decubitus:turn frequently. Wound care consulted. ? ?Failure to thrive ?Goals of care ?Poor prognosis:  ?Full code currently.Extensive discussion by oncology, last meeting 3/10 and also seen by palliative care team.Patient/son is not accepting of hospice philosophy despite detailed discussions by her oncologist. ?Dr. Burr Medico is  following.Patient moved to stepdown unit 3/13-updated patient's sons that overall prognosis remains guarded/poor. Patient is likely a candidate for residential hospice.Son is leaning towards DNR that but he would like to talk to his sister before making decision.  Appreciate palliative care following patient changed to DNR 3/14.  ?Will pursue further palliative discussion with son, son returning Thursday. ? ?DVT prophylaxis: Place and maintain sequential compression device Start: 07/03/21 0815 Eliquis. ?Code Status:   Code Status: DNR ?Family Communication:Plan of care discussed with patient.  Discussed with son multiple times at bedside and also over the phone last few days.  Palliative care updated patient's son 3/14. ? ?Disposition:Currently not medically stable for discharge. ?Status is: Inpatient ?Remains inpatient appropriate because: Ongoing management of pain and multiple issues. ? ?Objective: ?Vitals last 24 hrs: ?Vitals:  ? 07/24/21 0400 07/24/21 0448 07/24/21 0500 07/24/21 0600  ?BP: (!) 95/45  (!) 101/50 (!) 100/39  ?Pulse: (!) 117 (!) 114 (!) 111 (!) 107  ?Resp:  17 19 20   ?Temp:      ?TempSrc:      ?SpO2: 100% 100% 99% (!) 89%  ?Weight:  59.3 kg    ?Height:      ? ?Weight change: 0.7 kg ? ?Physical Examination: ?General exam: AA, ill looking, frail, on nasal cannula, older than stated age, weak appearing. ?HEENT:Oral mucosa moist, Ear/Nose WNL grossly, dentition normal. ?Respiratory system: bilaterally diminished,no use of accessory muscle ?Cardiovascular system: S1 & S2 +, No JVD,. ?Gastrointestinal system: Abdomen soft,NT,ND, BS+ ?Nervous System:Alert, awake, moving extremities and grossly nonfocal ?Extremities: edema on left upper extremity, trace edema right leg, peripheral pulses palpable.  ?Skin: No rashes,no icterus. ?MSK: thin muscle bulk,tone, power ? ?Medications reviewed:  ?Scheduled Meds: ? (feeding supplement) PROSource Plus  30 mL Oral TID BM  ? apixaban  5 mg Oral BID  ? carvedilol   3.125 mg Oral BID WC  ? Chlorhexidine Gluconate Cloth  6 each Topical Daily  ? DULoxetine  20 mg Oral Daily  ? feeding supplement  1 Container Oral  BID BM  ? lidocaine  1 patch Transdermal Q24H  ? mouth rinse  15 mL Mouth Rinse B

## 2021-07-24 NOTE — Progress Notes (Signed)
OT Cancellation Note ? ?Patient Details ?Name: Robin Estrada ?MRN: [id number] ?DOB: 26-Dec-1945 ? ? ?Cancelled Treatment:    Reason Eval/Treat Not Completed: Other (comment) ?D/c skilled OT services secondary to medical decline per palliative note. See latest therapy progress note for current level of functioning and process toward goals.  ?Reynold Mantell OTR/L, MS ?Acute Rehabilitation Department ?Office# [phone] ?Pager# [phone] ? ?07/24/2021, 7:27 AM ?

## 2021-07-24 NOTE — Progress Notes (Signed)
Nutrition Follow-up ? ?DOCUMENTATION CODES:  ? ?Not applicable ? ?INTERVENTION:  ?- will d/c Prosource Plus and Juven ?- will increase Boost Breeze from BID to TID, each supplement provides 250 kcal and 9 grams of protein. ? ? ?NUTRITION DIAGNOSIS:  ? ?Increased nutrient needs related to cancer and cancer related treatments, post-op healing as evidenced by estimated needs. -ongoing ? ?GOAL:  ? ?Patient will meet greater than or equal to 90% of their needs -unmet ? ?MONITOR:  ? ?PO intake, Supplement acceptance, Labs, Weight trends ? ? ?ASSESSMENT:  ? ?76 year old female with medical history of lung cancer, hx of breast cancer, new vertebral masses, HTN, mitral valve prolapse, heart murmur, and tuberculosis. She presented to the ED due to several days of worsening abdominal pain and nausea. CT abdomen showed large intussusception of the cecum and distal ileum within the ascending colon, dilatation of the ascending colon, and various and numerous lytic lesions throughout the thoracolumbar spine and pelvis consistent with metastatic disease. General surgery was consulted.  She underwent laparoscopic right colectomy with end ileostomy on 06/29/2021. She had a post-op cardiac arrest and inferior STEMI-- Cardiology consulted, not a candidate for cardiac intervention. ? ?Patient laying in bed. No visitors present at the time of RD visit. RN was at bedside and breakfast tray in the room. RN reports that patient is total assist, does require feeding assistance. She had eaten 2 bites of oatmeal and drank 4 oz of orange juice. Plan is for Tech to attempt to have patient eat more of breakfast shortly. ? ?RN shares that patient has been refusing most medications and interventions but is doing well with Boost Breeze and fruity things.  ? ?Palliative Care is following and patient is now DNR/DNI.  ? ?Weight has been fairly stable since 2/20. She is being weighed near daily. Moderate pitting edema to BUE documented in the edema  section of flow sheet yesterday evening. ? ? ?Labs reviewed; Alk Phos elevated but down from 3/14, AST elevated and trending up from 3/13 and 3/14. ? ?Medications reviewed; 1 tablet multivitamin with minerals/day, 40 mg oral protonix/day. ? ? ? ?Diet Order:   ?Diet Order   ? ?       ?  Diet regular Room service appropriate? Yes; Fluid consistency: Thin  Diet effective now       ?  ? ?  ?  ? ?  ? ? ?EDUCATION NEEDS:  ? ?No education needs have been identified at this time ? ?Skin:  Skin Assessment: Skin Integrity Issues: ?Skin Integrity Issues:: DTI ?Incisions: abdomen (2/18) ?Other: sacrum; vertebral column ? ?Last BM:  3/15 (125 ml via ileostomy) ? ?Height:  ? ?Ht Readings from Last 1 Encounters:  ?07/22/21 5' (1.524 m)  ? ? ?Weight:  ? ?Wt Readings from Last 1 Encounters:  ?07/24/21 59.3 kg  ? ? ? ?BMI:  Body mass index is 25.53 kg/m?. ? ?Estimated Nutritional Needs:  ?Kcal:  1900-2150 kcal ?Protein:  105-120 grams ?Fluid:  >/= 2.2 L/day ? ? ? ? ?Jarome Matin, MS, RD, LDN ?Inpatient Clinical Dietitian ?RD pager # available in [place]  ?After hours/weekend pager # available in [place] ? ?

## 2021-07-25 DIAGNOSIS — Z515 Encounter for palliative care: Secondary | ICD-10-CM | POA: Diagnosis not present

## 2021-07-25 DIAGNOSIS — K561 Intussusception: Secondary | ICD-10-CM | POA: Diagnosis not present

## 2021-07-25 DIAGNOSIS — C50919 Malignant neoplasm of unspecified site of unspecified female breast: Secondary | ICD-10-CM | POA: Diagnosis not present

## 2021-07-25 DIAGNOSIS — Z7189 Other specified counseling: Secondary | ICD-10-CM | POA: Diagnosis not present

## 2021-07-25 LAB — CBC
HCT: 29.7 % — ABNORMAL LOW (ref 36.0–46.0)
Hemoglobin: 8.8 g/dL — ABNORMAL LOW (ref 12.0–15.0)
MCH: 28.7 pg (ref 26.0–34.0)
MCHC: 29.6 g/dL — ABNORMAL LOW (ref 30.0–36.0)
MCV: 96.7 fL (ref 80.0–100.0)
Platelets: 226 10*3/uL (ref 150–400)
RBC: 3.07 MIL/uL — ABNORMAL LOW (ref 3.87–5.11)
RDW: 20.6 % — ABNORMAL HIGH (ref 11.5–15.5)
WBC: 26.6 10*3/uL — ABNORMAL HIGH (ref 4.0–10.5)
nRBC: 0 % (ref 0.0–0.2)

## 2021-07-25 LAB — COMPREHENSIVE METABOLIC PANEL
ALT: 56 U/L — ABNORMAL HIGH (ref 0–44)
AST: 139 U/L — ABNORMAL HIGH (ref 15–41)
Albumin: 1.6 g/dL — ABNORMAL LOW (ref 3.5–5.0)
Alkaline Phosphatase: 504 U/L — ABNORMAL HIGH (ref 38–126)
Anion gap: 10 (ref 5–15)
BUN: 24 mg/dL — ABNORMAL HIGH (ref 8–23)
CO2: 25 mmol/L (ref 22–32)
Calcium: 9.4 mg/dL (ref 8.9–10.3)
Chloride: 99 mmol/L (ref 98–111)
Creatinine, Ser: 0.76 mg/dL (ref 0.44–1.00)
GFR, Estimated: >60 mL/min (ref >60)
Glucose, Bld: 128 mg/dL — ABNORMAL HIGH (ref 70–99)
Potassium: 3.4 mmol/L — ABNORMAL LOW (ref 3.5–5.1)
Sodium: 134 mmol/L — ABNORMAL LOW (ref 135–145)
Total Bilirubin: 0.7 mg/dL (ref 0.3–1.2)
Total Protein: 4.8 g/dL — ABNORMAL LOW (ref 6.5–8.1)

## 2021-07-25 MED ORDER — POLYVINYL ALCOHOL 1.4 % OP SOLN
1.0000 [drp] | OPHTHALMIC | Status: DC | PRN
  Filled 2021-07-25: qty 15

## 2021-07-25 NOTE — Discharge Summary (Signed)
?Physician Discharge Summary ?  ?Patient: Robin Estrada MRN: [id number] DOB: Jan 07, 1946  ?Admit date:     06/28/2021  ?Discharge date: 07/25/21  ?Discharge Physician: Oswald Hillock  ? ?PCP: Plotnikov, Evie Lacks, MD  ? ?Recommendations at discharge:  ? ?Patient to be discharged to residential hospice. ? ?Discharge Diagnoses: ?Principal Problem: ?  Intussusception of cecum ([place]) ?Active Problems: ?  Essential hypertension ?  Small bowel intussusception ([place]) ?  Palliative care by specialist ?  Goals of care, counseling/discussion ? ?Resolved Problems: ?  * No resolved hospital problems. * ? 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  ?CT abdomen with IV contrast showed large intussusception of the cecum and distal ileum within the length of the ascending colon and subsequent dilatation of the ascending colon. General surgery was consulted.  She underwent laparoscopic right colectomy with end ileostomy on 06/29/2021.She had a post-op cardiac arrest and inferior STEMI-- Cardiology consulted, Not a candidate for cardiac intervention.overall tolerating diet having colostomy output. kyphoplasty considered but was not approved by her insurance. Patient remains deconditioned weak with overall  w/ poor prognosis with ongoing poor oral intake failure to thrive ,is withdrawn deconditioned.  3/13 moved to stepdown due to tachypnea lethargy tachycardia shallow breathing.  Pulmonary consulted-has right-sided pleural effusion chest x-ray CT chest was ordered-enlarging masslike consolidation in the right upper lobe, large multiloculated right pleural effusion, progressive intrathoracic metastatic disease ?Enlarging bilateral pleural and parenchymal lung nodules/masses, diffuse brain metastases, enlarging left chest wall mass and lymphadenopathy.   Pulmonary deferred thoracentesis at this time.  Palliative care following and after discussion with son transitioned to DNR. ? ?Assessment and Plan: ? ?Large cecal and distal ileum intussusception with bowel obstruction: ?Presented with severe abdominal pain with nausea and found to have intussusception s/p laparoscopic right colectomy with end ileostomy 2/1, last CT abdomen 2/28-showed small areas of intussusception, and does have metastatic disease-she is poor repeat surgical surgical candidate.  Having colostomy output, x-ray abdomen 3/13-stool in the rectum paucity of bowel gas no definitive acute finding.  Patient has been transitioned to comfort care only.  Plan to transfer to residential hospice. ?  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  ?Pathologic fracture deformity at the level of T11 ?Cancer related pain:  ?s/p total mastectomy in 2019,  s/p CT-guided lung biopsy on 05/14/2021- results consistent of poorly differentiated malignancy with sarcomatoid and epithelioid features. ?Continue pain control-OxyContin discontinued as it made her lethargic.  Dilaudid was discontinued 3/14 as she became more lethargic with it-but overnight patient was "screaming in pain" and with dilaudid had good response-we will have palliative care adjust the medication-I will increase Toradol dose for now.CT chest 3/13 showed progressive metastatic disease see report below for details: ?  ?"1.Enlarging masslike consolidation within the right upper lobe. Exact measurement of the underlying mass is difficult given the lack of intravenous contrast and dense consolidation of the adjacent right middle and right lower lobes. Findings are consistent  with progression of disease. ?2. Large multilocular right pleural effusion, which has increased ?since prior exam. ?3. Progressive intrathoracic metastatic disease, with new and enlarging bilateral pleural and parenchymal lung nodules/masses,diffuse bony  metastases, enlarging left chest wall mass, and lymphadenopathy as above". ?  ?Acute hypoxic aspiratory failure ?Large multiloculated right pleural effusion-likely atelectasis ?Emphysema of the lung ?Enlarging mass to consolidation within the right upper lobe: ?Remains on 2 L nasal cannula no significant shortness of breath, chest x-ray/CT chest with progressive metastatic disease multiloculated pleural effusion.  PCCM saw the patient deferred thoracentesis at this time. previously on Unasyn and then meropenem and stopped on 3/10.  Patient was started on Zosyn.  At this time antibiotics will be discontinued as patient to be transferred to residential hospice. ?  ?Acute blood loss anemia ?Anemia of chronic disease: ?S/p 1 u PRBC on 3/3 and IV iron on 3/4.Stable. ?Last Labs   ?       ?Recent Labs  ?Lab 07/20/21 ?0623 07/21/21 ?7893 07/22/21 ?8101 07/23/21 ?0241 07/24/21 ?7510  ?HGB 9.0* 8.8* 9.2* 10.3* 9.7*  ?HCT 30.0* 29.3* 30.7* 33.1* 32.1*  ?  ?  ?Inferior STEMI / Postop cardiac arrest  ?Ischemic cardiomyopathy: ?Troponin peaked to 24,000,TTE  w/LV ejection fraction of 50%, the left ventricle demonstrated RWMA.  S/p heparin gtt>now on Eliquis, coreg low dose,.refusing statin,zetia, imdur.Cardiology feels that she is a poor cath candidate and have signed off.    ?  ?Acute DVT of the left posterior tibial vein on 2/23: Patient was started on Lovenox.  At this time Lovenox will be discontinued as patient is going to residential hospice for comfort care only. ? ?  ?PTA sacral decubitus:turn frequently. Wound care consulted. ?  ?Failure to thrive ?Palliative care discussion with family, patient is DNR.  Plan to transition to comfort measures only.  Plan to transfer to  residential hospice today. ? ? ?  ? ? ?Consultants: Palliative care, cardiology ?Procedures performed:  ?Disposition:  ?Diet recommendation:  ?Discharge Diet Orders (From admission, onward)  ? ?  Start     Ordered  ? 07/25/21 0000  Diet - low sodium heart healthy       ? 07/25/21 1414  ? ?  ?  ? ?  ? ?Regular diet ?DISCHARGE MEDICATION: ?Allergies as of 07/25/2021   ? ?   Reactions  ? Statins Nausea And Vomiting, Other (See Comments)  ? MUSCLE PAIN  ? Amlodipine   ? fatigue  ? Clonidine Derivatives   ? headache  ? Losartan   ? abd pain  ? Sulfa Antibiotics Nausea Only  ? Sulfamethoxazole-trimethoprim Nausea Only  ? " Severe Nausea "  ? ?  ? ?  ?Medication List  ?  ? ?STOP taking these medications   ? ?atenolol 100 MG tablet ?Commonly known as: TENORMIN ?  ?cholecalciferol 1000 units tablet ?Commonly known as: VITAMIN D ?  ?cyanocobalamin 1000 MCG tablet ?  ?fentaNYL 12 MCG/HR ?Commonly known as: [place] ?  ?multivitamin with minerals Tabs tablet ?  ?pantoprazole 40 MG tablet ?Commonly known as: PROTONIX ?  ?senna-docusate 8.6-50 MG tablet ?Commonly known as: Senokot-S ?  ? ?  ? ?TAKE these medications   ? ?acetaminophen 500 MG tablet ?Commonly known as: TYLENOL ?Take 1 tablet (500 mg total) by mouth every 8 (eight) hours as needed for headache. ?  ?ondansetron 4 MG tablet ?Commonly known as: ZOFRAN ?Take 1 tablet (4 mg total) by mouth every 8 (eight) hours as needed for nausea. ?  ? ?  ? ? Follow-up Information   ? ? Leighton Ruff, MD. Daphane Shepherd on 06/16/8525.   ?Specialties: General Surgery, Colon and Rectal Surgery ?Why: Your appointment is 08/13/21 @ 10:20 am ?  please arrive 30 minutes prior to your appointment to check in and fill out paperwork. Bring photo ID and insurance information ?Contact information: ?[address] ?[address] ?[place] [address] ?[phone] ? ? ?  ?  ? ?  ?  ? ?  ? ?Discharge Exam: ?Filed Weights  ? 07/22/21 0832 07/23/21 0400 07/24/21 0448  ?Weight: 58.6 kg 57.8 kg 59.3 kg  ? ?General-appears  in no acute distress ?Heart-S1-S2, regular, no murmur auscultated ?Lungs-clear to auscultation bilaterally, no wheezing or crackles auscultated ?Abdomen-soft, nontender, no organomegaly ?Extremities-

## 2021-07-25 NOTE — Progress Notes (Signed)
Robin Estrada   DOB:[birth year]   GH#:[id number]   CVE#:[id number] ? ?Oncology follow up  ? ?Subjective: Chart reviewed.  Patient is transitioning to comfort measures.  She is now a DNR.  Family would like her transfer to [facility] when bed is available. ? ?Objective:  ?Vitals:  ? 07/25/21 0700 07/25/21 0800  ?BP:  (!) 116/37  ?Pulse: (!) 45 (!) 49  ?Resp: (!) 24 (!) 23  ?Temp:  98.1 ?F (36.7 ?C)  ?SpO2: 100% 100%  ?  Body mass index is 25.53 kg/m?. ? ?Intake/Output Summary (Last 24 hours) at 07/25/2021 1106 ?Last data filed at 07/25/2021 0800 ?Gross per 24 hour  ?Intake 253.19 ml  ?Output 220 ml  ?Net 33.19 ml  ? ? ? General: Sleeping, no distress ? Patient was not disturbed per son's request ? ?CBG (last 3)  ?No results for input(s): GLUCAP in the last 72 hours. ? ? ? ?Labs:  ?Urine Studies ?No results for input(s): UHGB, CRYS in the last 72 hours. ? ?Invalid input(s): UACOL, UAPR, USPG, UPH, UTP, UGL, UKET, UBIL, UNIT, UROB, ULEU, UEPI, UWBC, URBC, UBAC, CAST, UCOM, BILUA ? ?Basic Metabolic Panel: ?Recent Labs  ?Lab 07/21/21 ?0258 07/22/21 ?5277 07/23/21 ?0241 07/24/21 ?8242 07/25/21 ?0244  ?NA 135 134* 134* 136 134*  ?K 4.0 3.4* 3.7 3.9 3.4*  ?CL 102 99 98 101 99  ?CO2 26 25 26 24 25   ?GLUCOSE 125* 128* 116* 99 128*  ?BUN 13 13 13 16  24*  ?CREATININE 0.36* 0.37* 0.56 0.51 0.76  ?CALCIUM 9.5 9.4 9.4 9.4 9.4  ? ?GFR ?Estimated Creatinine Clearance: 48.2 mL/min (by C-G formula based on SCr of 0.76 mg/dL). ?Liver Function Tests: ?Recent Labs  ?Lab 07/22/21 ?0829 07/23/21 ?0241 07/24/21 ?3536 07/25/21 ?0244  ?AST 65* 74* 83* 139*  ?ALT 32 32 35 56*  ?ALKPHOS 416* 418* 396* 504*  ?BILITOT 0.5 0.6 1.0 0.7  ?PROT 5.1* 4.9* 5.1* 4.8*  ?ALBUMIN 1.7* <1.5* 1.7* 1.6*  ? ?No results for input(s): LIPASE, AMYLASE in the last 168 hours. ? ?No results for input(s): AMMONIA in the last 168 hours. ?Coagulation profile ?No results for input(s): INR, PROTIME in the last 168 hours. ? ? ? ?CBC: ?Recent Labs  ?Lab 07/19/21 ?0451  07/20/21 ?1443 07/21/21 ?1540 07/22/21 ?0867 07/23/21 ?0241 07/24/21 ?6195 07/25/21 ?0244  ?WBC 27.1* 27.7* 27.0* 30.7* 28.8* 31.7* 26.6*  ?NEUTROABS 23.8* 24.2* 23.6*  --   --   --   --   ?HGB 8.8* 9.0* 8.8* 9.2* 10.3* 9.7* 8.8*  ?HCT 29.3* 30.0* 29.3* 30.7* 33.1* 32.1* 29.7*  ?MCV 95.4 94.0 94.8 95.0 92.5 96.7 96.7  ?PLT 320 298 288 258 247 254 226  ? ?Cardiac Enzymes: ?No results for input(s): CKTOTAL, CKMB, CKMBINDEX, TROPONINI in the last 168 hours. ?BNP: ?Invalid input(s): POCBNP ?CBG: ?Recent Labs  ?Lab 07/22/21 ?0733  ?GLUCAP 126*  ? ?D-Dimer ?No results for input(s): DDIMER in the last 72 hours. ?Hgb A1c ?No results for input(s): HGBA1C in the last 72 hours. ? ?Lipid Profile ?No results for input(s): CHOL, HDL, LDLCALC, TRIG, CHOLHDL, LDLDIRECT in the last 72 hours. ? ?Thyroid function studies ?No results for input(s): TSH, T4TOTAL, T3FREE, THYROIDAB in the last 72 hours. ? ?Invalid input(s): FREET3 ?Anemia work up ?No results for input(s): VITAMINB12, FOLATE, FERRITIN, TIBC, IRON, RETICCTPCT in the last 72 hours. ? ?Microbiology ?Recent Results (from the past 240 hour(s))  ?MRSA Next Gen by PCR, Nasal     Status: None  ? Collection Time: 07/22/21  8:31  AM  ? Specimen: Nasal Mucosa; Nasal Swab  ?Result Value Ref Range Status  ? MRSA by PCR Next Gen NOT DETECTED NOT DETECTED Final  ?  Comment: (NOTE) ?The GeneXpert MRSA Assay (FDA approved for NASAL specimens only), ?is one component of a comprehensive MRSA colonization surveillance ?program. It is not intended to diagnose MRSA infection nor to guide ?or monitor treatment for MRSA infections. ?Test performance is not FDA approved in patients less than 2 years ?old. ?Performed at [facility], [address] ?[place], [place] [address] ?  ? ? ? ? ? ?Studies:  ?No results found. ? ?Assessment: 76 y.o. female  ? ?Back pain from bone mets  ?metastatic metaplastic breast cancer to lung, colon, and the bone, diagnosed in January 2023, status  post palliative radiation to T11  ?Pathologic T11 compression fracture s/p RT  ?Anemia secondary to iron deficiency and chronic disease ?Gastric ulcer ?Cirrhosis of the liver ?Emphysema ?Cystic mass of the pancreas ?Mild hyperbilirubinemia ?10.  Left lower extremity DVT, on eliquis/heparin  ?11. Leukocytosis, likely reactive, possible related to her metastatic cancer ?12.  Right pleural effusion ? ?Plan:  ?-The patient is transitioning to comfort measures.  I spoke with the son today and advised him that we agree with this plan. ?-The patient's son had questions regarding next steps and I reached out to the hospice liaison to determine potential timing of transition to [facility].  They are currently awaiting review of this patient by the hospice physician and once eligibility confirmed, they will transfer to [facility] when a bed is available.  Timing at this point is unknown but there were a few beds available at [facility] earlier today.  They may or may not be available later today.  The patient's son was updated and he is aware that we will transfer her to [facility] as soon as a bed becomes available. ? ? ?Mikey Bussing, NP ?07/25/2021   ? ? ? ?
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
## 2021-07-25 NOTE — Progress Notes (Signed)
Patient communicated with me at the start of shift and took some fluids by mouth. Son arrived around 18 and patient was alert and oriented to answer questions appropriately with him as the interpreter. Our interpreter services are unsuccessful due to patient eyesight, and inability to focus. She denied any pain at this time but was ready for bed so I gave her medication as prescribed. Idelle Crouch was called by son, and visited with him for most of the night. PT received her last rights, and told her son that she was ready.  ? ?She has communicated very little since that time. I did not do wound care, as it was previously done by dayshift, and did minimum turns as patient appears to be transitioning. Frequent night interventions do not seem to be in line with care goals. She was un-arousable while priest was present. She often sleeps with her eyes open so I have been applying eyedrops frequently. Patient was wiped down with CHG cloths and has been kept clean, and dry. Patient son seems to agree with minimal interruptions during sleep hours. PT softly moaning during morning care, given medication as prescribed. Seems restful now.  ?

## 2021-07-25 NOTE — TOC Transition Note (Signed)
Transition of Care (TOC) - CM/SW Discharge Note ? ?Patient Details  ?Name: Robin Estrada ?MRN: [id number] ?Date of Birth: 1945-05-29 ? ?Transition of Care (TOC) CM/SW Contact:  ?[name], LCSW ?Phone Number: ?07/25/2021, 2:52 PM ? ?Clinical Narrative: [facility] has a bed today and son has signed consents. Medical necessity form done; PTAR scheduled. Discharge packet complete. DNR on chart. TOC signing off. ? ?Final next level of care: [place] ?Barriers to Discharge: Barriers Resolved ? ?Patient Goals and CMS Choice ?Patient states their goals for this hospitalization and ongoing recovery are:: Go to [facility] ?CMS Medicare.gov Compare Post Acute Care list provided to:: Patient Represenative (must comment) ?Choice offered to / list presented to : Adult Children ? ?Discharge Placement         ?Patient chooses bed at: Other - please specify in the comment section below: Dance movement psychotherapist) ?Patient to be transferred to facility by: PTAR ?Name of family member notified: Averiana Clouatre (son) ?Patient and family notified of of transfer: 07/25/21 ? ?Discharge Plan and Services ?In-house Referral: Clinical Social Work ?Discharge Planning Services: CM Consult ?Post Acute Care Choice: Residential Hospice Bed          ?DME Arranged: N/A ?DME Agency: NA ? ?Readmission Risk Interventions ?Readmission Risk Prevention Plan 07/03/2021  ?Transportation Screening Complete  ?PCP or Specialist Appt within 3-5 Days Complete  ?[place] or Home Care Consult Complete  ?Social Work Consult for [place] Planning/Counseling Complete  ?Palliative Care Screening Complete  ?Medication Review Press photographer) Complete  ?Some recent data might be hidden  ? ? ? ? ?

## 2021-07-25 NOTE — Progress Notes (Addendum)
Manufacturing engineer [facility]) Hospital Liaison Note ? ?Referral received for patient/family interest in [facility]. Chart under review by [facility] Physician.  ? ?Hospice eligibility confirmed.  ? ?Unit RN please call report to [phone] prior to patient leaving the unit. Please send signed DNR and paperwork with patient. ? ?Please call with any questions or concerns. Thank you ? ?Roselee Nova, LCSW ?[facility] [facility] Liaison ?[phone] ?

## 2021-07-25 NOTE — Progress Notes (Signed)
Patient in-route to [facility] via [facility], Patients son notified and will meet mother at [facility].  ? ? ?

## 2021-07-25 NOTE — TOC Progression Note (Signed)
Transition of Care (TOC) - Progression Note  ? ?Patient Details  ?Name: Robin Estrada ?MRN: [id number] ?Date of Birth: [birth year] ? ?Transition of Care (TOC) CM/SW Contact  ?[name], LCSW ?Phone Number: ?07/25/2021, 10:47 AM ? ?Clinical Narrative: TOC notified family is agreeable to referral to [facility]. CSW called [name] with [facility] to make residential hospice referral. Patient to be assessed for [facility]. CSW updated patient's son, [name]. TOC awaiting approval for [facility] and bed availability. ? ?Expected Discharge Plan: [place] ?Barriers to Discharge: Hospice Bed not available (Waiting for hospice to screen patient for [facility]) ? ?Expected Discharge Plan and Services ?Expected Discharge Plan: [place] ?In-house Referral: Clinical Social Work ?Discharge Planning Services: CM Consult ?Post Acute Care Choice: Residential Hospice Bed ?Living arrangements for the past 2 months: [place]          ?DME Arranged: N/A ?DME Agency: NA ? ?Readmission Risk Interventions ?Readmission Risk Prevention Plan 07/03/2021  ?Transportation Screening Complete  ?PCP or Specialist Appt within 3-5 Days Complete  ?[place] or Home Care Consult Complete  ?Social Work Consult for [place] Planning/Counseling Complete  ?Palliative Care Screening Complete  ?Medication Review Press photographer) Complete  ?Some recent data might be hidden  ? ?

## 2021-08-06 ENCOUNTER — Encounter (HOSPITAL_COMMUNITY): Payer: Self-pay

## 2021-08-10 DEATH — deceased

## 2021-08-11 ENCOUNTER — Encounter: Payer: Self-pay | Admitting: Hematology

## 2021-11-14 ENCOUNTER — Encounter: Payer: Self-pay | Admitting: Hematology
